# Patient Record
Sex: Female | Born: 1950
Health system: Southern US, Community
[De-identification: ages and names within clinical notes are randomized; demographics above are authoritative.]

## PROBLEM LIST (undated history)

## (undated) DIAGNOSIS — G4734 Idiopathic sleep related nonobstructive alveolar hypoventilation: Secondary | ICD-10-CM

## (undated) DIAGNOSIS — E669 Obesity, unspecified: Secondary | ICD-10-CM

## (undated) DIAGNOSIS — G4730 Sleep apnea, unspecified: Secondary | ICD-10-CM

## (undated) DIAGNOSIS — R0602 Shortness of breath: Secondary | ICD-10-CM

## (undated) DIAGNOSIS — J449 Chronic obstructive pulmonary disease, unspecified: Secondary | ICD-10-CM

## (undated) DIAGNOSIS — M1990 Unspecified osteoarthritis, unspecified site: Secondary | ICD-10-CM

## (undated) DIAGNOSIS — F99 Mental disorder, not otherwise specified: Secondary | ICD-10-CM

## (undated) DIAGNOSIS — E785 Hyperlipidemia, unspecified: Secondary | ICD-10-CM

## (undated) DIAGNOSIS — E119 Type 2 diabetes mellitus without complications: Secondary | ICD-10-CM

## (undated) DIAGNOSIS — F209 Schizophrenia, unspecified: Secondary | ICD-10-CM

## (undated) DIAGNOSIS — K219 Gastro-esophageal reflux disease without esophagitis: Secondary | ICD-10-CM

## (undated) DIAGNOSIS — E039 Hypothyroidism, unspecified: Secondary | ICD-10-CM

## (undated) DIAGNOSIS — R011 Cardiac murmur, unspecified: Secondary | ICD-10-CM

## (undated) DIAGNOSIS — F319 Bipolar disorder, unspecified: Secondary | ICD-10-CM

## (undated) DIAGNOSIS — K222 Esophageal obstruction: Secondary | ICD-10-CM

## (undated) DIAGNOSIS — I10 Essential (primary) hypertension: Secondary | ICD-10-CM

## (undated) DIAGNOSIS — F419 Anxiety disorder, unspecified: Secondary | ICD-10-CM

## (undated) HISTORY — DX: Unspecified osteoarthritis, unspecified site: M19.90

## (undated) HISTORY — DX: Hyperlipidemia, unspecified: E78.5

## (undated) HISTORY — DX: Type 2 diabetes mellitus without complications: E11.9

## (undated) HISTORY — PX: EYE SURGERY: SHX253

## (undated) HISTORY — DX: Hypothyroidism, unspecified: E03.9

## (undated) HISTORY — PX: CHOLECYSTECTOMY: SHX55

## (undated) HISTORY — DX: Obesity, unspecified: E66.9

## (undated) HISTORY — PX: TONSILLECTOMY: SUR1361

## (undated) HISTORY — DX: Sleep apnea, unspecified: G47.30

---

## 2006-12-27 ENCOUNTER — Emergency Department (HOSPITAL_COMMUNITY): Admission: EM | Admit: 2006-12-27 | Discharge: 2006-12-27 | Payer: Self-pay | Admitting: Emergency Medicine

## 2007-09-10 ENCOUNTER — Ambulatory Visit: Payer: Self-pay | Admitting: Cardiology

## 2007-09-27 ENCOUNTER — Ambulatory Visit: Payer: Self-pay | Admitting: Cardiology

## 2007-10-07 ENCOUNTER — Ambulatory Visit: Payer: Self-pay | Admitting: Cardiology

## 2007-11-17 ENCOUNTER — Ambulatory Visit: Payer: Self-pay | Admitting: Cardiology

## 2007-11-26 ENCOUNTER — Ambulatory Visit: Payer: Self-pay | Admitting: Cardiology

## 2007-12-02 ENCOUNTER — Ambulatory Visit (HOSPITAL_COMMUNITY): Admission: RE | Admit: 2007-12-02 | Discharge: 2007-12-02 | Payer: Self-pay | Admitting: Nephrology

## 2007-12-03 ENCOUNTER — Ambulatory Visit: Payer: Self-pay | Admitting: Cardiology

## 2007-12-10 ENCOUNTER — Ambulatory Visit: Payer: Self-pay | Admitting: Cardiology

## 2008-01-21 ENCOUNTER — Ambulatory Visit: Payer: Self-pay | Admitting: Cardiology

## 2008-02-05 ENCOUNTER — Emergency Department (HOSPITAL_COMMUNITY): Admission: EM | Admit: 2008-02-05 | Discharge: 2008-02-05 | Payer: Self-pay | Admitting: Emergency Medicine

## 2008-02-26 ENCOUNTER — Emergency Department (HOSPITAL_COMMUNITY): Admission: EM | Admit: 2008-02-26 | Discharge: 2008-02-27 | Payer: Self-pay | Admitting: Emergency Medicine

## 2008-07-14 ENCOUNTER — Encounter: Payer: Self-pay | Admitting: Cardiology

## 2008-09-23 ENCOUNTER — Emergency Department (HOSPITAL_COMMUNITY): Admission: EM | Admit: 2008-09-23 | Discharge: 2008-09-23 | Payer: Self-pay | Admitting: Emergency Medicine

## 2008-10-25 ENCOUNTER — Emergency Department (HOSPITAL_COMMUNITY): Admission: EM | Admit: 2008-10-25 | Discharge: 2008-10-25 | Payer: Self-pay | Admitting: Emergency Medicine

## 2008-11-21 ENCOUNTER — Emergency Department (HOSPITAL_COMMUNITY): Admission: EM | Admit: 2008-11-21 | Discharge: 2008-11-21 | Payer: Self-pay | Admitting: Emergency Medicine

## 2008-12-27 DIAGNOSIS — R0602 Shortness of breath: Secondary | ICD-10-CM | POA: Insufficient documentation

## 2008-12-27 DIAGNOSIS — I10 Essential (primary) hypertension: Secondary | ICD-10-CM

## 2008-12-27 DIAGNOSIS — R42 Dizziness and giddiness: Secondary | ICD-10-CM

## 2009-02-03 ENCOUNTER — Emergency Department (HOSPITAL_COMMUNITY): Admission: EM | Admit: 2009-02-03 | Discharge: 2009-02-03 | Payer: Self-pay | Admitting: Emergency Medicine

## 2009-06-02 ENCOUNTER — Emergency Department (HOSPITAL_COMMUNITY): Admission: EM | Admit: 2009-06-02 | Discharge: 2009-06-03 | Payer: Self-pay | Admitting: Emergency Medicine

## 2009-09-21 DIAGNOSIS — G4733 Obstructive sleep apnea (adult) (pediatric): Secondary | ICD-10-CM | POA: Insufficient documentation

## 2009-12-14 ENCOUNTER — Emergency Department (HOSPITAL_BASED_OUTPATIENT_CLINIC_OR_DEPARTMENT_OTHER): Admission: EM | Admit: 2009-12-14 | Discharge: 2009-12-14 | Payer: Self-pay | Admitting: Emergency Medicine

## 2009-12-14 ENCOUNTER — Ambulatory Visit: Payer: Self-pay | Admitting: Diagnostic Radiology

## 2010-02-19 DIAGNOSIS — F309 Manic episode, unspecified: Secondary | ICD-10-CM | POA: Insufficient documentation

## 2010-03-04 ENCOUNTER — Ambulatory Visit (HOSPITAL_COMMUNITY): Payer: Self-pay | Admitting: Psychiatry

## 2010-03-28 DIAGNOSIS — J449 Chronic obstructive pulmonary disease, unspecified: Secondary | ICD-10-CM | POA: Insufficient documentation

## 2010-05-03 ENCOUNTER — Encounter (HOSPITAL_COMMUNITY): Payer: Medicare Other | Admitting: Psychiatry

## 2010-05-03 ENCOUNTER — Ambulatory Visit (HOSPITAL_COMMUNITY): Admit: 2010-05-03 | Payer: Self-pay | Admitting: Psychiatry

## 2010-05-03 DIAGNOSIS — F259 Schizoaffective disorder, unspecified: Secondary | ICD-10-CM

## 2010-06-07 DIAGNOSIS — E039 Hypothyroidism, unspecified: Secondary | ICD-10-CM | POA: Insufficient documentation

## 2010-06-23 LAB — BASIC METABOLIC PANEL
CO2: 35 mEq/L — ABNORMAL HIGH (ref 19–32)
Chloride: 96 mEq/L (ref 96–112)
GFR calc Af Amer: >60 mL/min (ref >60)
Potassium: 4.4 mEq/L (ref 3.5–5.1)
Sodium: 135 mEq/L (ref 135–145)

## 2010-06-23 LAB — DIFFERENTIAL
Basophils Absolute: 0 10*3/uL (ref 0.0–0.1)
Basophils Relative: 0 % (ref 0–1)
Lymphocytes Relative: 18 % (ref 12–46)
Monocytes Absolute: 0.9 10*3/uL (ref 0.1–1.0)
Neutrophils Relative %: 67 % (ref 43–77)

## 2010-06-23 LAB — CBC
MCHC: 34 g/dL (ref 30.0–36.0)
MCV: 89 fL (ref 78.0–100.0)
Platelets: 180 10*3/uL (ref 150–400)
WBC: 7.9 10*3/uL (ref 4.0–10.5)

## 2010-06-23 LAB — D-DIMER, QUANTITATIVE: D-Dimer, Quant: 0.92 ug/mL-FEU — ABNORMAL HIGH (ref 0.00–0.48)

## 2010-07-07 LAB — URINALYSIS, ROUTINE W REFLEX MICROSCOPIC
Hgb urine dipstick: NEGATIVE
Ketones, ur: NEGATIVE mg/dL
Nitrite: NEGATIVE

## 2010-07-07 LAB — WET PREP, GENITAL
Trich, Wet Prep: NONE SEEN
Yeast Wet Prep HPF POC: NONE SEEN

## 2010-07-07 LAB — GC/CHLAMYDIA PROBE AMP, GENITAL: Chlamydia, DNA Probe: NEGATIVE

## 2010-07-08 LAB — URINE CULTURE: Colony Count: >=100000

## 2010-07-08 LAB — URINALYSIS, ROUTINE W REFLEX MICROSCOPIC
Glucose, UA: NEGATIVE mg/dL
Nitrite: POSITIVE — AB
Protein, ur: 30 mg/dL — AB
Urobilinogen, UA: 0.2 mg/dL (ref 0.0–1.0)

## 2010-07-08 LAB — GLUCOSE, CAPILLARY: Glucose-Capillary: 217 mg/dL — ABNORMAL HIGH (ref 70–99)

## 2010-07-08 LAB — URINE MICROSCOPIC-ADD ON

## 2010-07-12 ENCOUNTER — Encounter (HOSPITAL_COMMUNITY): Payer: Medicare Other | Admitting: Psychiatry

## 2010-07-26 ENCOUNTER — Encounter (HOSPITAL_COMMUNITY): Payer: Medicare Other | Admitting: Psychiatry

## 2010-07-26 DIAGNOSIS — F259 Schizoaffective disorder, unspecified: Secondary | ICD-10-CM

## 2010-08-13 NOTE — Assessment & Plan Note (Signed)
[facility]                          [facility]   NAME:Chelsea [name], Chelsea [name]                       MRN:          [id number]  DATE:12/10/2007                            DOB:          1950-08-11    Chelsea [name] is back for early followup.  I have been working with Dr.  Kristian Covey in an attempt to try to find a diuretic dose that is working  for the patient.  When I saw her on December 03, 2007, I was worried  that she was gaining fluid weight.  Her BMET check that day showed that  her renal function had improved greatly.  I still decided to put her  back on Bumex at 1 mg.  She then had labs on December 07, 2007, and  December 09, 2007.  This may have occurred in their office may have  ordered labs without letting Dr. Kristian Covey know.  Regardless, her  creatinine is in the 1 and/or 1.1 range.  This is excellent for her.  Her potassium is stable.  She is doing well on this dosing.  Her  breathing is better.   I had further discussions with her reminding her at each visit to be  careful with her salt intake and to limit her fluid intake.  She clearly  needs ongoing reminders about fluid.  She drinks excess fluid and I am  sure of this.   PAST MEDICAL HISTORY:   ALLERGIES:  PENICILLIN.   MEDICATIONS:  Aspirin, hydrochlorothiazide, potassium, Bumex 1 mg,  Xanax, paroxetine, glimepiride, Geodon, Lantus, Combivent, Vytorin,  trazodone, and Actos.   OTHER MEDICAL PROBLEMS:  See the list on my note of December 03, 2007.   REVIEW OF SYSTEMS:  She is really doing relatively well, and her review  of systems, otherwise, is negative.   PHYSICAL EXAMINATION:  Blood pressure is 120/60.  Heart rate is 64.  The  patient is oriented to person, time, and place.  Affect is normal.  She  is here with her family member who is a Engineer, civil (consulting).  HEENT reveals no  xanthelasma.  She has normal extraocular motion.  There are no carotid  bruits.  There is no jugular venous  distention.  Lungs are clear.  Respiratory effort is not labored.  Cardiac exam reveals S1 with an S2.  There are no clicks or significant murmurs.  The abdomen is obese.  She  still has 1+ peripheral edema.  We will not do better than this.   Problems are listed on the prior notes.  Volume overload is an issue and  she needs to be careful with her fluid intake.  Her current diuretic  dosing is stable.  I will see her back in 4 weeks.  No change in her  meds at this time.     Luis Abed, MD, [facility]  Electronically Signed    JDK/MedQ  DD: 12/10/2007  DT: 12/10/2007  Job #: [id number]   cc:   Jorja Loa, M.D.  Doreen Beam, MD

## 2010-08-13 NOTE — Assessment & Plan Note (Signed)
[facility]                          [facility]   NAME:Chelsea [name], Chelsea [name]                       MRN:          [id number]  DATE:12/03/2007                            DOB:          May 04, 1950    CARDIOLOGIST:  Luis Abed, MD, [facility]   PRIMARY CARE PHYSICIAN:  Doreen Beam, MD   REASON FOR VISIT:  1 week followup.   HISTORY OF PRESENT ILLNESS:  Chelsea [name] is a 60 year old female patient  who is morbidly obese, who has been followed very closely by Dr. Myrtis Ser  with a history of peripheral edema and exertional dyspnea.  She had a  nuclear scan done in June 2009 that revealed a small apical anterior  defect consistent with ischemia.  She was initially set up for cardiac  catheterization, but this was deferred secondary to acute-on-chronic  renal insufficiency.  Her EF was 70% by nuclear scan and was noted to be  normal by an echocardiogram earlier this year.  She was taken off of her  Bumex and Dr. Myrtis Ser has followed her closely over the last several weeks.  She had gained a few pounds when last seen, but was kept off of her  Bumex as her creatinine had improved.  She is currently being worked up  by Dr. Kristian Covey.  In the office today, she notes that she is doing well  overall.  She does complain of some dizziness today.  She states that  this has just been today.  She feels like she is going to fall and lose  her balance.  There is a questionable component of near syncope.  She  denies any syncope.  She denies chest pain.  She sleeps on 2 pillows.  She denies paroxysmal nocturnal dyspnea.  She feels like her leg edema  is worsening.   CURRENT MEDICATIONS:  Aspirin 81 mg daily, HCTZ 12.5 mg daily, Potassium  10 mEq daily, Tylenol Arthritis, Xanax 1 mg in the morning, 2 mg in the  evening, Paroxetine 40 mg daily  Geodon 20 mg in the morning, 40 mg in the evening, Glimepiride 2 mg  daily, Lantus 28 units at bedtime, Combivent 2 puffs daily,  Vytorin  10/20 mg daily  Trazodone 100 mg 2 tablets at bedtime, Bumex (on hold), Actos 30 mg  daily, Xanax 1 mg daily.   PHYSICAL EXAMINATION:  GENERAL:  She is an obese female in no acute  distress.  VITAL SIGNS:  Blood pressure is 129/84, pulse 81, weight 332 pounds  which is up 2 pounds since her last visit.  Orthostatic vital signs:  Blood pressures sitting is 129/80 and standing  141/96; pulse sitting is 72 and standing 99.  HEENT:  Normal.  NECK:  I can appreciate JVD at 90 degrees.  CARDIAC:  Distant heart sounds.  Normal S1 and S2.  Regular rate and  rhythm.  I cannot appreciate any gallops.  LUNGS:  Clear to auscultation bilaterally without wheezing, rhonchi, or  rales.  ABDOMEN:  Soft and nontender with normoactive bowel sounds.  EXTREMITIES:  2+ edema bilaterally.  NEUROLOGIC:  She  is alert and oriented x3.  Cranial nerves II through  XII are grossly intact.   Electrocardiogram reveals sinus rhythm, heart rate is 76, left axis  deviation.  PR interval with a 156 milliseconds, poor R-wave  progression.  No significant changes with previous tracing dated November 17, 2007.   ASSESSMENT AND PLAN:  1. Dyspnea with exertion and peripheral edema (volume overload) in the      setting of preserved left ventricular function.  Question of      chronic diastolic congestive heart failure.  The patient has gained      2 pounds since her last visit.  Her breathing seems to be a little      bit more short than previously.  I discussed this with Dr. Myrtis Ser who      also saw the patient.  At this point, we have to place her back on      Bumex 1 mg a day.  We will follow her renal function and potassium      very closely over the next week, and she will be brought back in      close follow Dr. Myrtis Ser.  Of note, she is on Actos.  This could      certainly be contributing to some of her edema.  She should follow      up with Dr. Sherril Croon to further discuss this.  2. Renal insufficiency.  This is  followed closely by Dr. Kristian Covey.  As      noted above, we will follow her renal function with the adjustment      of her diuretics.  3. Dizziness.  The patient's EKG reveals normal sinus rhythm today.      Her orthostatic vital signs did not show a blood pressure drop.      She does have a heart rate increase.  We will need to keep a close      eye on this with the adjustment of medications.  4. Hypertension.  This is overall well controlled.  No other      medication changes were made today.  5. Disposition.  The patient will be brought back in followup with Dr.      Myrtis Ser in the next 1-2 weeks for followup.      Tereso Newcomer, PA-C  Electronically Signed      Luis Abed, MD, [facility]  Electronically Signed   SW/MedQ  DD: 12/03/2007  DT: 12/04/2007  Job #: [id number]   cc:   Doreen Beam, MD  Jorja Loa, M.D.

## 2010-08-13 NOTE — Assessment & Plan Note (Signed)
[facility]                          [facility]   NAME:[name], [name]                       MRN:          [id number]  DATE:10/07/2007                            DOB:          1950/12/25    PRIMARY CARDIOLOGIST:  Dr. Willa Rough.   REASON FOR VISIT:  Scheduled followup.   Please refer to his recent initial consultation note of September 10, 2007,  for complete details.   [name] returns to the clinic in followup of her recent dobutamine  stress Cardiolite test, for evaluation of persistent exertional dyspnea.  She presented with several cardiac risk factors, but no documented  history of CAD.  A recent a 2-D echo, performed in Dr. Sherril Croon' office,  suggested normal LVF with mild LVH.   Additionally, Dr. Myrtis Ser up titrated bumetanide to 2 mg daily for more  aggressive management of significant peripheral edema.   Clinically, [name] suggests that although she has had significant  diuresis with the increased dose of diuretic, she has not noted any  appreciable decrease in her exertional dyspnea.   The stress test was completed on September 27, 2007, and performed as a  dobutamine study.  The patient did not develop chest pain and there were  no significant EKG changes.  However, the perfusion images, reviewed by  Dr. Andee Lineman, were interpreted as suggesting evidence of a small,  partially reversible anterior apical defect, consistent with ischemia.  Ejection fraction was normal (EF 70%), with normal wall motion.   All of these results were reviewed in detail with the patient.   CURRENT MEDICATIONS:  1. Aspirin 81 daily.  2. Actos 30 daily.  3. Bumetanide 2 daily.  4. Hydrochlorothiazide 12.5 daily.  5. Vytorin 10/20 daily.  6. Trazodone 200 q.h.s.  7. Combivent 2 puffs daily.  8. Lantus 20 units daily.  9. Diclofenac 75 daily.  10.Geodon 40 q.h.s.  11.Potassium 10 daily.  12.Glimepiride 2 daily.  13.Paroxetine 40 daily.  14.Xanax 1 mg q.a.m./2 mg q.p.m.   PHYSICAL EXAMINATION:  VITAL SIGNS:  Blood pressure 122/72, pulse 80,  regular, weight 329.8 (down 11 pound).  GENERAL:  A 60 year old female, morbidly obese, sitting upright, no  distress.  HEENT:  Normocephalic and atraumatic.  NECK:  Palpable carotid pulse without bruits; unable to assess JVD,  secondary to marked neck girth.  LUNGS:  Diminished breath sounds in bases, but without crackles or  wheezes.  HEART:  RRR (S1S2).  Diminished heart sounds, but with no significant  murmurs.  No rubs.  ABDOMEN:  Protuberant, soft, and nontender with intact bowel sounds.  EXTREMITIES:  Palpable left posterior tibialis pulses, minimally  palpable right posterior tibialis pulse.  There is trace - 1+ edema.  NEUROLOGIC:  Extremely flat affect, but no focal deficit.   IMPRESSION:  1. Exertional dyspnea.      a.     Question anginal equivalent.      b.     Abnormal dobutamine stress Cardiolite imaging study,       suggestive of small anteroapical ischemia; EF 70%.  2. Multiple cardiac risk factors.  a.     Insulin-dependent diabetes mellitus.      b.     Hypertension.      c.     History of tobacco.      d.     Age.  3. Morbid obesity.  4. Peripheral edema, improved.   PLAN:  1. Following review with Dr. Willa Rough, recommendation is to      proceed with further evaluation to rule out significant underlying      coronary artery disease.  The patient was agreeable with this plan      and the risk/benefits of the procedure were discussed.  We will      arrange to have this scheduled in our JV catheterization lab, early      next week.  2. Complete baseline labs will be drawn, including a TSH level.  3. Add p.r.n. nitroglycerin to current medication regimen.   ADDENDUM:  Pre-cardiac catheterization labs are now reviewed.  Creatinine has risen from a previous level of 1.3 to a current level of  1.90, with a corresponding BUN of 37 (GFR 27).   Potassium is 3.2.   Following review of these data with Dr. Willa Rough, plan for a  diagnostic coronary angiogram are to be placed on hold, pending  improvement in renal function.  Hydrochlorothiazide will be  discontinued and the patient is to remain on Bumex at current dose of 2  mg daily.  Repeat BMET in 1 week, followed by a return clinic visit in 2  weeks.      Chelsea Searing, PA-C  Electronically Signed      Luis Abed, MD, [facility]  Electronically Signed   GS/MedQ  DD: 10/07/2007  DT: 10/08/2007  Job #: [id number]   cc:   Luis Abed, MD, [facility]  Doreen Beam, MD

## 2010-08-13 NOTE — Assessment & Plan Note (Signed)
[facility]                          [facility]   NAME:[name], [name]                       MRN:          [id number]  DATE:11/26/2007                            DOB:          [birth year]    HISTORY OF PRESENT ILLNESS:  Chelsea [name] was seen for followup.  I had  seen her on November 16, 2007.  I was quite concerned about her renal  status.  We arranged for a visit with Dr. Kristian Covey, and she has already  seen him.  Also I have put her Bumex completely on hold.  She had been  on dose as high as 2 mg a day along with hydrochlorothiazide.  Since  that time, she has not developed any marked edema.  She has gained 5  pounds.  She is not having any chest pain.  If anything her shortness of  breath is somewhat better.   PAST MEDICAL HISTORY:   ALLERGIES:  PENICILLIN.   MEDICATIONS:  Aspirin, hydrochlorothiazide, potassium, Xanax, Geodon,  paroxetine, glimepiride, diclofenac, Lantus, Combivent, and trazodone;  her Bumex is on hold, and Actos.   OTHER MEDICAL PROBLEMS:  See the complete list on my note of November 17, 2007.   REVIEW OF SYSTEMS:  Review of systems other than the HPI is negative.   PHYSICAL EXAMINATION:  VITAL SIGNS:  Her weight is up to 330 pounds.  Blood pressure is 130/70.  GENERAL:  The patient is here with her family member who is a Engineer, civil (consulting).  The patient is oriented to person, time, and place.  Affect is normal.  HEENT:  Reveals no xanthelasma.  She has normal extraocular motion.  LUNGS:  Her lungs are clear.  Respiratory effort is not labored.  CARDIAC:  Reveals S1 and S2.  There are no clicks or significant  murmurs.  ABDOMEN:  Obese.  EXTREMITIES:  She has no significant peripheral edema.   PROBLEMS:  Problems include:  1. Morbid obesity.  2. Diabetes.  3. Shortness of breath.  This is stable as of today.  4. Question of mild abnormality on her nuclear scan.  5. Normal LV function.  6. Hypertension.  7. History of  tobacco.  8. Peripheral edema that has not returned.  9. Hallucinations at night.  10.Renal dysfunction.  She is in the midst of workup by Dr. Kristian Covey.      I am concerned about 5 pounds of weight gain.  However, there is no      edema and she is feeling well.  I am      hesitant to restart her Bumex at this time.  I will leave her off      Bumex.  She will continue with the workup with Dr. Kristian Covey and      then I will see her for Cardiology followup.     Luis Abed, MD, [facility]  Electronically Signed    JDK/MedQ  DD: 11/26/2007  DT: 11/26/2007  Job #: [id number]   cc:   Jorja Loa, M.D.  Doreen Beam, MD

## 2010-08-13 NOTE — Assessment & Plan Note (Signed)
[facility]                          [facility]   NAME:POWELLAlvah, Gilder                       MRN:          [id number]  DATE:11/17/2007                            DOB:          09/02/1950    Chelsea [name] is here for cardiology followup.  I saw her in consultation  on September 10, 2007, and then she was seen again in our office on October 07, 2007.  She has chest discomfort.  We need to rule out ischemia, but I  have not been worried about unstable symptoms.  She did have a  dobutamine stress Cardiolite.  This was done to help assess her chest  pain and persistent exertional dyspnea.  She also has several cardiac  risk factors, but no proven coronary disease.  She also has had a 2D  echo that showed good LV function with LVH.  The Cardiolite scan raised  the question of mild ischemia.  Some of her shortness of breath may have  been related to volume overload.  Her diuretics were increased.  She did  diurese.  Unfortunately, she has had increasing creatinine.  It is my  understanding that we had tried to back off on her diuretics.  I  realized now, as I am dictating this note that she may, in fact, still  be on Bumex 2 mg daily along with 12.5 mg hydrochlorothiazide.  Clearly,  this needs to be decreased before there is any further assessment.   PAST MEDICAL HISTORY:   ALLERGIES:  PENICILLIN.   MEDICATIONS:  1. Aspirin, diuretics, potassium, Xanax, paroxetine, Geodon,      glimepiride, diclofenac, Lantus, combivent, trazodone, and Actos.   OTHER MEDICAL PROBLEMS:  See the list below.   REVIEW OF SYSTEMS:  In addition to other problems, the patient says that  she has been having some hallucinations at night.  Otherwise, review of  systems is negative.   PHYSICAL EXAMINATION:  VITAL SIGNS:  Blood pressure is 128/70.  Pulse is  68.  Weight is 325 pounds, this is decreased from 329 pounds on October 05, 2007, and 340 pounds on September 07, 2007.  HEENT:  Reveals no xanthelasma.  She has normal extraocular motion.  There are no carotid bruits.  There is no jugular venous distention.  The patient is significantly overweight.  LUNGS:  Clear.  Respiratory effort is not labored.  CARDIAC:  Reveals S1 and S2.  There are no clicks or significant  murmurs.  ABDOMEN:  Obese.  She has no peripheral edema today.   Historically, the patient's creatinine had been in the range of 1.3 in  May 2009.  It was 1.9 on October 07, 2007, and a repeat on October 28, 2007,  was 2.2.  I am very concerned about this rise.  We had adjusted her  diuretics, but she came to me on Bumex 1 mg and hydrochlorothiazide  12.5.  She has already left the office.  We will be in touch with her  immediately to cut out her Bumex completely and then arrange for  followup.  Also in  the meantime, we are arranging for her to see Dr.  Kristian Covey to help with a full assessment of her renal status.   PROBLEMS:  1. Morbid obesity.  2. Diabetes.  3. Shortness of breath, this will continued to be evaluated.  4. Question of mild abnormality on her nuclear scan.  5. Normal left ventricular function.  6. Diabetes.  7. Hypertension.  8. History of tobacco.  9. Peripheral edema, improved.  10.Hallucinations at night.     Luis Abed, MD, [facility]  Electronically Signed    JDK/MedQ  DD: 11/17/2007  DT: 11/18/2007  Job #: [id number]   cc:   Doreen Beam, MD  Jorja Loa, M.D.

## 2010-08-13 NOTE — Assessment & Plan Note (Signed)
[facility]                          [facility]   NAME:Chelsea [name]                       MRN:          [id number]  DATE:01/21/2008                            DOB:          [birth year]    [name] is actually doing well.  It appears that her volume status  has stabilized.  I believe she is a little bit more active.  She is  watching her salt and fluid intake.  She is taking her medicines  appropriately.  She has shortness of breath with exertion and peripheral  edema historically.  She says that her breathing is doing somewhat  better.   ALLERGIES:  PENICILLIN.   MEDICATIONS:  1. Aspirin.  2. Potassium.  3. Bumetanide 1 mg daily.  4. Diclofenac.   OTHER MEDICAL PROBLEMS:  See the list on my note of December 03, 2007.   REVIEW OF SYSTEMS:  She is not having any fevers or chills.  She has no  GI or GU symptoms.  There are no rashes.  Her review of systems  otherwise is negative.   PHYSICAL EXAMINATION:  VITAL SIGNS:  Weight today is 325 pounds.  This  is stable by our scales.  GENERAL:  The patient is oriented to person, time, and place.  She is  here with her family member who is a Engineer, civil (consulting).  HEENT:  No xanthelasma.  She has normal extraocular motion.  NECK:  There are no carotid bruits.  There is no jugular venous  distention.  LUNGS:  Clear.  Respiratory effort is not labored.  CARDIAC:  S1 with an S2.  There are no clicks or significant murmurs.  ABDOMEN:  Obese.  EXTREMITIES:  Her legs are also large, but she has no marked edema.   Problems are listed on my note of December 03, 2007.  Dyspnea.  This  seems to be better with her current volume status and current overall  medical status.  She is stable.  No change in her meds.  I will see her  back in 3 months.     Luis Abed, MD, [facility]  Electronically Signed   JDK/MedQ  DD: 01/21/2008  DT: 01/22/2008  Job #: [id number]   cc:   Doreen Beam, MD  Jorja Loa,  M.D.

## 2010-08-13 NOTE — Assessment & Plan Note (Signed)
[facility]                          [facility]   NAME:POWELLMataya, Kilduff                       MRN:          [id number]  DATE:09/10/2007                            DOB:          November 24, 1950    Ms. Artiaga is here for the evaluation of shortness of breath.  The  patient is severely overweight.  She is 5 feet 4 inches tall and 340  pounds.  She has exertional shortness of breath.  She has had this for a  long period of time, but she is quite limited.  She received an inhaler  recently and says that she is doing somewhat better.  She does have  diabetes.  She is on Actos.  She has not had syncope or presyncope.   ALLERGIES:  PENICILLIN.   MEDICATIONS:  1. Xanax.  2. Paroxetine.  3. __________ 20.  4. Bumetanide 1 mg.  5. Glimepiride 2.  6. Potassium 10.  7. Actos.  8. Diclofenac.  9. Lantus.  10.Combivent.   OTHER MEDICAL PROBLEMS:  See the list below.   SOCIAL HISTORY:  The patient lives here in town.  She does not smoke.   FAMILY HISTORY:  There is no strong family history of coronary disease.   REVIEW OF SYSTEMS:  As of today, her major complaint is her shortness of  breath.  Otherwise, review of systems is negative.   PHYSICAL EXAMINATION:  GENERAL:  The patient is oriented to person,  time, and place.  She is markedly overweight.  Affect is normal.  VITAL SIGNS:  Blood pressure is 121/69.  Her pulse is 77.  HEENT:  No xanthelasma.  She has normal extraocular motion.  NECK:  There are no carotid bruits.  There is no jugular venous  distention.  LUNGS:  Clear.  Respiratory effort is not labored.  CARDIAC:  S1 with an S2.  There are no clicks or significant murmurs.  ABDOMEN:  Obese.  The patient has peripheral edema.   LABORATORY DATA:  EKG reveals decreased anterior R wave.  The patient  had an echo that was done as an outpatient dated 08/16/2007.  It was  technically difficult.  There was LVH, but there were no  significant  valvular abnormalities and the ejection fraction was felt to be 60%.  Also, the patient has a BUN of 25 and a creatinine of 1.3 by recent labs  dated 08/24/2007.   PROBLEM LIST:  1. Very morbid obesity.  2. History of tonsillectomy and cholecystectomy.  3. Diabetes.  4. Current shortness of breath.  The patient certainly has multiple      reasons for being short of breath.  We will try to help decide if      we think this is cardiac.  She has good left ventricular function      in systole.  She does have peripheral edema at this time.  She is      on bumetanide 1 mg.  We will increase this to 2 mg.  We will see if      she diureses better.  We  will talk to her about salt and fluid      intake.  She will have a dobutamine Cardiolite scan and then we      will see her for followup.     Luis Abed, MD, [facility]  Electronically Signed    JDK/MedQ  DD: 09/10/2007  DT: 09/11/2007  Job #: [id number]   cc:   Doreen Beam, MD

## 2010-08-13 NOTE — Assessment & Plan Note (Signed)
[facility]                          [facility] [facility]   NAME:POWELLKymari, Lollis                       MRN:          [id number]  DATE:10/07/2007                            DOB:          May 06, 1950    ADDENDUM  Pre-cardiac catheterization labs are now reviewed.  Creatinine has risen  from a previous level of 1.3 to a current level of 1.90, with a  corresponding BUN of 37 (GFR 27).  Potassium is 3.2.   Reviewed these data with Dr. Willa Rough.  Diagnostic coronary  angiogram is to be placed on hold, pending improvement in renal  function.  Hydrochlorothiazide will be discontinued and the patient is  to remain on Bumex at current dose of 2 mg daily.  Repeat to be made in  1 week.  Follow up by return clinic visit in 2 weeks.     Gene Serpe, PA-C     GS/MedQ  DD: 10/08/2007  DT: 10/09/2007  Job #: [id number]

## 2010-09-13 ENCOUNTER — Encounter (INDEPENDENT_AMBULATORY_CARE_PROVIDER_SITE_OTHER): Payer: Medicare Other | Admitting: Psychiatry

## 2010-09-13 DIAGNOSIS — F259 Schizoaffective disorder, unspecified: Secondary | ICD-10-CM

## 2010-09-27 ENCOUNTER — Encounter (HOSPITAL_COMMUNITY): Payer: Medicare Other | Admitting: Psychiatry

## 2010-11-05 DIAGNOSIS — I517 Cardiomegaly: Secondary | ICD-10-CM | POA: Insufficient documentation

## 2010-11-22 ENCOUNTER — Encounter (HOSPITAL_COMMUNITY): Payer: Medicare Other | Admitting: Psychiatry

## 2010-11-29 ENCOUNTER — Encounter (HOSPITAL_COMMUNITY): Payer: Medicare Other | Admitting: Psychiatry

## 2010-12-20 ENCOUNTER — Encounter (HOSPITAL_COMMUNITY): Payer: Medicare Other | Admitting: Psychiatry

## 2010-12-27 ENCOUNTER — Encounter (INDEPENDENT_AMBULATORY_CARE_PROVIDER_SITE_OTHER): Payer: Medicare Other | Admitting: Psychiatry

## 2010-12-27 DIAGNOSIS — F259 Schizoaffective disorder, unspecified: Secondary | ICD-10-CM

## 2010-12-31 LAB — URINE MICROSCOPIC-ADD ON

## 2010-12-31 LAB — URINALYSIS, ROUTINE W REFLEX MICROSCOPIC
Hgb urine dipstick: NEGATIVE
Nitrite: NEGATIVE
Protein, ur: NEGATIVE
Specific Gravity, Urine: 1.025
Urobilinogen, UA: 0.2

## 2010-12-31 LAB — DIFFERENTIAL
Basophils Absolute: 0
Basophils Relative: 1
Eosinophils Absolute: 0.4
Eosinophils Relative: 6 — ABNORMAL HIGH
Monocytes Absolute: 0.4
Monocytes Relative: 6
Neutro Abs: 4.3

## 2010-12-31 LAB — BASIC METABOLIC PANEL
BUN: 10
Calcium: 9
Creatinine, Ser: 1.15
GFR calc non Af Amer: 49 — ABNORMAL LOW
Glucose, Bld: 193 — ABNORMAL HIGH
Potassium: 3.5

## 2010-12-31 LAB — URINE CULTURE

## 2010-12-31 LAB — CBC
Hemoglobin: 12.4
MCHC: 33.9
MCV: 89.8
RDW: 14.5

## 2011-01-10 DIAGNOSIS — J309 Allergic rhinitis, unspecified: Secondary | ICD-10-CM | POA: Insufficient documentation

## 2011-01-24 ENCOUNTER — Encounter (INDEPENDENT_AMBULATORY_CARE_PROVIDER_SITE_OTHER): Payer: Medicare Other | Admitting: Psychiatry

## 2011-01-24 DIAGNOSIS — F259 Schizoaffective disorder, unspecified: Secondary | ICD-10-CM

## 2011-04-11 DIAGNOSIS — J309 Allergic rhinitis, unspecified: Secondary | ICD-10-CM | POA: Diagnosis not present

## 2011-04-11 DIAGNOSIS — J069 Acute upper respiratory infection, unspecified: Secondary | ICD-10-CM | POA: Diagnosis not present

## 2011-04-11 DIAGNOSIS — J449 Chronic obstructive pulmonary disease, unspecified: Secondary | ICD-10-CM | POA: Diagnosis not present

## 2011-04-11 DIAGNOSIS — E781 Pure hyperglyceridemia: Secondary | ICD-10-CM | POA: Diagnosis not present

## 2011-04-11 DIAGNOSIS — E119 Type 2 diabetes mellitus without complications: Secondary | ICD-10-CM | POA: Diagnosis not present

## 2011-04-11 DIAGNOSIS — E785 Hyperlipidemia, unspecified: Secondary | ICD-10-CM | POA: Diagnosis not present

## 2011-04-18 ENCOUNTER — Encounter (HOSPITAL_COMMUNITY): Payer: Medicare Other | Admitting: Psychiatry

## 2011-05-02 ENCOUNTER — Ambulatory Visit (HOSPITAL_COMMUNITY): Payer: Medicare Other | Admitting: Psychiatry

## 2011-05-02 DIAGNOSIS — IMO0002 Reserved for concepts with insufficient information to code with codable children: Secondary | ICD-10-CM | POA: Diagnosis not present

## 2011-05-02 DIAGNOSIS — M79609 Pain in unspecified limb: Secondary | ICD-10-CM | POA: Diagnosis not present

## 2011-05-02 DIAGNOSIS — R209 Unspecified disturbances of skin sensation: Secondary | ICD-10-CM | POA: Diagnosis not present

## 2011-05-09 DIAGNOSIS — L03039 Cellulitis of unspecified toe: Secondary | ICD-10-CM | POA: Diagnosis not present

## 2011-05-16 DIAGNOSIS — IMO0002 Reserved for concepts with insufficient information to code with codable children: Secondary | ICD-10-CM | POA: Diagnosis not present

## 2011-05-23 DIAGNOSIS — L03039 Cellulitis of unspecified toe: Secondary | ICD-10-CM | POA: Diagnosis not present

## 2011-05-30 ENCOUNTER — Ambulatory Visit (HOSPITAL_COMMUNITY): Payer: Medicare Other | Admitting: Psychiatry

## 2011-05-30 DIAGNOSIS — L97509 Non-pressure chronic ulcer of other part of unspecified foot with unspecified severity: Secondary | ICD-10-CM | POA: Diagnosis not present

## 2011-06-06 DIAGNOSIS — L97509 Non-pressure chronic ulcer of other part of unspecified foot with unspecified severity: Secondary | ICD-10-CM | POA: Diagnosis not present

## 2011-06-13 ENCOUNTER — Ambulatory Visit (INDEPENDENT_AMBULATORY_CARE_PROVIDER_SITE_OTHER): Payer: Medicare Other | Admitting: Psychiatry

## 2011-06-13 ENCOUNTER — Encounter (HOSPITAL_COMMUNITY): Payer: Self-pay | Admitting: Psychiatry

## 2011-06-13 VITALS — BP 148/84 | HR 67 | Wt 286.0 lb

## 2011-06-13 DIAGNOSIS — F200 Paranoid schizophrenia: Secondary | ICD-10-CM

## 2011-06-13 MED ORDER — TRAZODONE HCL 100 MG PO TABS: ORAL_TABLET | ORAL | Status: DC

## 2011-06-13 MED ORDER — ZIPRASIDONE HCL 60 MG PO CAPS: 60.0000 mg | ORAL_CAPSULE | Freq: Every day | ORAL | Status: DC
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
## 2011-06-20 DIAGNOSIS — F309 Manic episode, unspecified: Secondary | ICD-10-CM | POA: Diagnosis not present

## 2011-06-20 DIAGNOSIS — IMO0001 Reserved for inherently not codable concepts without codable children: Secondary | ICD-10-CM | POA: Diagnosis not present

## 2011-06-20 DIAGNOSIS — E785 Hyperlipidemia, unspecified: Secondary | ICD-10-CM | POA: Diagnosis not present

## 2011-06-20 DIAGNOSIS — E781 Pure hyperglyceridemia: Secondary | ICD-10-CM | POA: Diagnosis not present

## 2011-06-20 DIAGNOSIS — E1169 Type 2 diabetes mellitus with other specified complication: Secondary | ICD-10-CM | POA: Insufficient documentation

## 2011-06-20 DIAGNOSIS — E039 Hypothyroidism, unspecified: Secondary | ICD-10-CM | POA: Diagnosis not present

## 2011-06-20 DIAGNOSIS — E119 Type 2 diabetes mellitus without complications: Secondary | ICD-10-CM | POA: Diagnosis not present

## 2011-06-23 ENCOUNTER — Other Ambulatory Visit (HOSPITAL_COMMUNITY): Payer: Self-pay | Admitting: Psychiatry

## 2011-06-23 DIAGNOSIS — F200 Paranoid schizophrenia: Secondary | ICD-10-CM

## 2011-06-23 MED ORDER — TRAZODONE HCL 100 MG PO TABS: ORAL_TABLET | ORAL | Status: DC

## 2011-06-23 MED ORDER — ZIPRASIDONE HCL 60 MG PO CAPS: 60.0000 mg | ORAL_CAPSULE | Freq: Every day | ORAL | Status: DC

## 2011-06-23 NOTE — Progress Notes (Signed)
Call return to the patient.  Patient is taking trazodone 100 mg which she reported given by her pharmacy.  I called the pharmacy and her primary care physician as she was given the prescription last time by her primary care physician.  As per pharmacy and primary care physician she has given trazodone 200 mg but it is unclear why she is taking only 100 mg.  I recommended to take trazodone 200 mg and Geodon 60 mg.  I will authorize one more time these prescription to the pharmacy.

## 2011-07-01 ENCOUNTER — Other Ambulatory Visit (HOSPITAL_COMMUNITY): Payer: Self-pay | Admitting: *Deleted

## 2011-07-02 ENCOUNTER — Telehealth (HOSPITAL_COMMUNITY): Payer: Self-pay | Admitting: *Deleted

## 2011-07-02 MED ORDER — ALPRAZOLAM 1 MG PO TABS: 1.0000 mg | ORAL_TABLET | Freq: Every evening | ORAL | Status: DC | PRN

## 2011-07-11 ENCOUNTER — Ambulatory Visit (INDEPENDENT_AMBULATORY_CARE_PROVIDER_SITE_OTHER): Payer: Medicare Other | Admitting: Psychiatry

## 2011-07-11 ENCOUNTER — Encounter (HOSPITAL_COMMUNITY): Payer: Self-pay | Admitting: Psychiatry

## 2011-07-11 VITALS — BP 136/95 | HR 85 | Wt 287.4 lb

## 2011-07-11 DIAGNOSIS — F200 Paranoid schizophrenia: Secondary | ICD-10-CM

## 2011-07-11 MED ORDER — ZIPRASIDONE HCL 60 MG PO CAPS: 60.0000 mg | ORAL_CAPSULE | Freq: Every day | ORAL | Status: DC

## 2011-07-11 MED ORDER — TRAZODONE HCL 100 MG PO TABS: ORAL_TABLET | ORAL | Status: DC

## 2011-07-11 MED ORDER — ALPRAZOLAM 1 MG PO TABS: 1.0000 mg | ORAL_TABLET | Freq: Every evening | ORAL | Status: DC | PRN

## 2011-07-11 MED ORDER — PAROXETINE HCL 40 MG PO TABS: 40.0000 mg | ORAL_TABLET | ORAL | Status: DC
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
## 2011-07-22 ENCOUNTER — Telehealth (HOSPITAL_COMMUNITY): Payer: Self-pay

## 2011-08-15 ENCOUNTER — Other Ambulatory Visit: Payer: Self-pay | Admitting: Family Medicine

## 2011-08-15 DIAGNOSIS — E559 Vitamin D deficiency, unspecified: Secondary | ICD-10-CM | POA: Diagnosis not present

## 2011-08-15 DIAGNOSIS — I10 Essential (primary) hypertension: Secondary | ICD-10-CM | POA: Diagnosis not present

## 2011-08-15 DIAGNOSIS — E782 Mixed hyperlipidemia: Secondary | ICD-10-CM | POA: Diagnosis not present

## 2011-08-15 DIAGNOSIS — Z124 Encounter for screening for malignant neoplasm of cervix: Secondary | ICD-10-CM | POA: Diagnosis not present

## 2011-08-15 DIAGNOSIS — Z780 Asymptomatic menopausal state: Secondary | ICD-10-CM

## 2011-08-15 DIAGNOSIS — F411 Generalized anxiety disorder: Secondary | ICD-10-CM | POA: Diagnosis not present

## 2011-08-15 DIAGNOSIS — Z79899 Other long term (current) drug therapy: Secondary | ICD-10-CM | POA: Diagnosis not present

## 2011-08-15 DIAGNOSIS — Z1231 Encounter for screening mammogram for malignant neoplasm of breast: Secondary | ICD-10-CM

## 2011-08-15 DIAGNOSIS — Z0000 Encounter for general adult medical examination without abnormal findings: Secondary | ICD-10-CM | POA: Diagnosis not present

## 2011-08-15 DIAGNOSIS — N390 Urinary tract infection, site not specified: Secondary | ICD-10-CM | POA: Diagnosis not present

## 2011-08-15 DIAGNOSIS — E1169 Type 2 diabetes mellitus with other specified complication: Secondary | ICD-10-CM | POA: Diagnosis not present

## 2011-08-15 DIAGNOSIS — E039 Hypothyroidism, unspecified: Secondary | ICD-10-CM | POA: Diagnosis not present

## 2011-08-29 DIAGNOSIS — R040 Epistaxis: Secondary | ICD-10-CM | POA: Diagnosis not present

## 2011-08-29 DIAGNOSIS — J309 Allergic rhinitis, unspecified: Secondary | ICD-10-CM | POA: Diagnosis not present

## 2011-09-05 ENCOUNTER — Encounter (HOSPITAL_COMMUNITY): Payer: Self-pay | Admitting: Psychiatry

## 2011-09-05 ENCOUNTER — Ambulatory Visit (INDEPENDENT_AMBULATORY_CARE_PROVIDER_SITE_OTHER): Payer: Medicare Other | Admitting: Psychiatry

## 2011-09-05 VITALS — BP 126/86 | Wt 272.2 lb

## 2011-09-05 DIAGNOSIS — F200 Paranoid schizophrenia: Secondary | ICD-10-CM

## 2011-09-05 MED ORDER — TRAZODONE HCL 100 MG PO TABS: ORAL_TABLET | ORAL | Status: DC

## 2011-09-05 MED ORDER — ZIPRASIDONE HCL 60 MG PO CAPS: 60.0000 mg | ORAL_CAPSULE | Freq: Every day | ORAL | Status: DC

## 2011-09-05 MED ORDER — PAROXETINE HCL 40 MG PO TABS: 40.0000 mg | ORAL_TABLET | ORAL | Status: DC
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
## 2011-09-19 ENCOUNTER — Ambulatory Visit: Payer: Medicare Other

## 2011-09-19 ENCOUNTER — Other Ambulatory Visit: Payer: Medicare Other

## 2011-10-17 ENCOUNTER — Ambulatory Visit: Payer: Medicare Other

## 2011-10-17 ENCOUNTER — Other Ambulatory Visit: Payer: Medicare Other

## 2011-10-17 DIAGNOSIS — E119 Type 2 diabetes mellitus without complications: Secondary | ICD-10-CM | POA: Diagnosis not present

## 2011-10-17 DIAGNOSIS — Z23 Encounter for immunization: Secondary | ICD-10-CM | POA: Diagnosis not present

## 2011-10-17 DIAGNOSIS — E039 Hypothyroidism, unspecified: Secondary | ICD-10-CM | POA: Diagnosis not present

## 2011-10-17 DIAGNOSIS — I10 Essential (primary) hypertension: Secondary | ICD-10-CM | POA: Diagnosis not present

## 2011-10-30 ENCOUNTER — Telehealth (HOSPITAL_COMMUNITY): Payer: Self-pay

## 2011-10-30 NOTE — Telephone Encounter (Signed)
9:08am 10/30/11 pt's sister called because she is the transportation - the pt's sister has to work and can't make the appt./sh

## 2011-10-31 ENCOUNTER — Other Ambulatory Visit: Payer: Medicare Other

## 2011-10-31 ENCOUNTER — Ambulatory Visit (HOSPITAL_COMMUNITY): Payer: Medicare Other | Admitting: Psychiatry

## 2011-10-31 ENCOUNTER — Ambulatory Visit: Payer: Medicare Other

## 2011-11-12 ENCOUNTER — Other Ambulatory Visit (HOSPITAL_COMMUNITY): Payer: Self-pay | Admitting: *Deleted

## 2011-11-12 DIAGNOSIS — F200 Paranoid schizophrenia: Secondary | ICD-10-CM

## 2011-11-12 MED ORDER — ALPRAZOLAM 1 MG PO TABS: 1.0000 mg | ORAL_TABLET | Freq: Every evening | ORAL | Status: AC | PRN

## 2011-11-12 MED ORDER — PAROXETINE HCL 40 MG PO TABS: 40.0000 mg | ORAL_TABLET | ORAL | Status: DC

## 2011-11-14 ENCOUNTER — Ambulatory Visit (HOSPITAL_COMMUNITY): Payer: Medicare Other | Admitting: Psychiatry

## 2011-11-20 ENCOUNTER — Other Ambulatory Visit (HOSPITAL_COMMUNITY): Payer: Self-pay | Admitting: *Deleted

## 2011-11-20 DIAGNOSIS — F200 Paranoid schizophrenia: Secondary | ICD-10-CM

## 2011-11-20 MED ORDER — ZIPRASIDONE HCL 60 MG PO CAPS: 60.0000 mg | ORAL_CAPSULE | Freq: Every day | ORAL | Status: DC

## 2011-11-20 MED ORDER — TRAZODONE HCL 100 MG PO TABS: ORAL_TABLET | ORAL | Status: DC

## 2011-11-28 ENCOUNTER — Encounter (HOSPITAL_COMMUNITY): Payer: Self-pay | Admitting: Psychiatry

## 2011-11-28 ENCOUNTER — Ambulatory Visit (INDEPENDENT_AMBULATORY_CARE_PROVIDER_SITE_OTHER): Payer: Medicare Other | Admitting: Psychiatry

## 2011-11-28 VITALS — BP 120/76 | HR 60 | Wt 273.0 lb

## 2011-11-28 DIAGNOSIS — F200 Paranoid schizophrenia: Secondary | ICD-10-CM
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
## 2011-12-18 ENCOUNTER — Other Ambulatory Visit (HOSPITAL_COMMUNITY): Payer: Self-pay | Admitting: *Deleted

## 2012-01-09 ENCOUNTER — Other Ambulatory Visit (HOSPITAL_COMMUNITY): Payer: Self-pay | Admitting: Psychiatry

## 2012-01-09 DIAGNOSIS — F200 Paranoid schizophrenia: Secondary | ICD-10-CM

## 2012-01-09 MED ORDER — ALPRAZOLAM 1 MG PO TABS: 1.0000 mg | ORAL_TABLET | Freq: Every day | ORAL | Status: DC

## 2012-01-09 MED ORDER — TRAZODONE HCL 100 MG PO TABS: ORAL_TABLET | ORAL | Status: DC

## 2012-01-09 MED ORDER — PAROXETINE HCL 40 MG PO TABS: 40.0000 mg | ORAL_TABLET | ORAL | Status: DC

## 2012-01-09 MED ORDER — ZIPRASIDONE HCL 60 MG PO CAPS: 60.0000 mg | ORAL_CAPSULE | Freq: Every day | ORAL | Status: DC

## 2012-01-14 ENCOUNTER — Other Ambulatory Visit (HOSPITAL_COMMUNITY): Payer: Self-pay | Admitting: Psychiatry

## 2012-01-16 DIAGNOSIS — IMO0001 Reserved for inherently not codable concepts without codable children: Secondary | ICD-10-CM | POA: Diagnosis not present

## 2012-01-16 DIAGNOSIS — Z23 Encounter for immunization: Secondary | ICD-10-CM | POA: Diagnosis not present

## 2012-02-27 ENCOUNTER — Ambulatory Visit (HOSPITAL_COMMUNITY): Payer: Self-pay | Admitting: Psychiatry

## 2012-03-05 ENCOUNTER — Ambulatory Visit
Admission: RE | Admit: 2012-03-05 | Discharge: 2012-03-05 | Disposition: A | Discharge status: Home / Self Care | Payer: Medicare Other | Source: Ambulatory Visit | Attending: Family Medicine | Admitting: Family Medicine

## 2012-03-05 ENCOUNTER — Ambulatory Visit (HOSPITAL_COMMUNITY): Payer: Self-pay | Admitting: Psychiatry

## 2012-03-05 DIAGNOSIS — Z1231 Encounter for screening mammogram for malignant neoplasm of breast: Secondary | ICD-10-CM

## 2012-03-05 DIAGNOSIS — Z780 Asymptomatic menopausal state: Secondary | ICD-10-CM

## 2012-03-10 ENCOUNTER — Ambulatory Visit (INDEPENDENT_AMBULATORY_CARE_PROVIDER_SITE_OTHER): Payer: Self-pay | Admitting: Psychiatry

## 2012-03-10 ENCOUNTER — Other Ambulatory Visit (HOSPITAL_COMMUNITY): Payer: Self-pay | Admitting: *Deleted

## 2012-03-10 ENCOUNTER — Encounter (HOSPITAL_COMMUNITY): Payer: Self-pay | Admitting: Psychiatry

## 2012-03-10 VITALS — Wt 274.0 lb

## 2012-03-10 DIAGNOSIS — F200 Paranoid schizophrenia: Secondary | ICD-10-CM

## 2012-03-10 MED ORDER — ZIPRASIDONE HCL 60 MG PO CAPS: 60.0000 mg | ORAL_CAPSULE | Freq: Every day | ORAL | Status: DC

## 2012-03-10 MED ORDER — ALPRAZOLAM 1 MG PO TABS: 1.0000 mg | ORAL_TABLET | Freq: Every day | ORAL | Status: DC

## 2012-03-10 MED ORDER — TRAZODONE HCL 100 MG PO TABS: ORAL_TABLET | ORAL | Status: DC

## 2012-03-10 MED ORDER — PAROXETINE HCL 40 MG PO TABS: 40.0000 mg | ORAL_TABLET | ORAL | Status: DC
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
## 2012-03-12 ENCOUNTER — Ambulatory Visit
Admission: RE | Admit: 2012-03-12 | Discharge: 2012-03-12 | Disposition: A | Discharge status: Home / Self Care | Payer: Medicare Other | Source: Ambulatory Visit | Attending: Family Medicine | Admitting: Family Medicine

## 2012-03-12 DIAGNOSIS — Z780 Asymptomatic menopausal state: Secondary | ICD-10-CM | POA: Diagnosis not present

## 2012-03-25 ENCOUNTER — Other Ambulatory Visit: Payer: Self-pay | Admitting: Family Medicine

## 2012-03-25 DIAGNOSIS — R928 Other abnormal and inconclusive findings on diagnostic imaging of breast: Secondary | ICD-10-CM

## 2012-04-02 ENCOUNTER — Ambulatory Visit
Admission: RE | Admit: 2012-04-02 | Discharge: 2012-04-02 | Disposition: A | Discharge status: Home / Self Care | Payer: Medicare Other | Source: Ambulatory Visit | Attending: Family Medicine | Admitting: Family Medicine

## 2012-04-02 ENCOUNTER — Other Ambulatory Visit: Payer: Self-pay | Admitting: Family Medicine

## 2012-04-02 DIAGNOSIS — R920 Mammographic microcalcification found on diagnostic imaging of breast: Secondary | ICD-10-CM | POA: Diagnosis not present

## 2012-04-02 DIAGNOSIS — R928 Other abnormal and inconclusive findings on diagnostic imaging of breast: Secondary | ICD-10-CM

## 2012-04-02 DIAGNOSIS — R921 Mammographic calcification found on diagnostic imaging of breast: Secondary | ICD-10-CM

## 2012-04-16 ENCOUNTER — Ambulatory Visit
Admission: RE | Admit: 2012-04-16 | Discharge: 2012-04-16 | Disposition: A | Discharge status: Home / Self Care | Payer: Medicare Other | Source: Ambulatory Visit | Attending: Family Medicine | Admitting: Family Medicine

## 2012-04-16 DIAGNOSIS — R921 Mammographic calcification found on diagnostic imaging of breast: Secondary | ICD-10-CM

## 2012-04-23 ENCOUNTER — Encounter (INDEPENDENT_AMBULATORY_CARE_PROVIDER_SITE_OTHER): Payer: Self-pay | Admitting: General Surgery

## 2012-04-23 ENCOUNTER — Telehealth (INDEPENDENT_AMBULATORY_CARE_PROVIDER_SITE_OTHER): Payer: Self-pay | Admitting: General Surgery

## 2012-04-23 ENCOUNTER — Ambulatory Visit (INDEPENDENT_AMBULATORY_CARE_PROVIDER_SITE_OTHER): Payer: Medicare Other | Admitting: General Surgery

## 2012-04-23 VITALS — BP 130/62 | HR 72 | Temp 96.6°F | Resp 18 | Ht 62.0 in | Wt 278.2 lb

## 2012-04-23 DIAGNOSIS — R920 Mammographic microcalcification found on diagnostic imaging of breast: Secondary | ICD-10-CM

## 2012-04-23 DIAGNOSIS — E1169 Type 2 diabetes mellitus with other specified complication: Secondary | ICD-10-CM | POA: Diagnosis not present

## 2012-04-23 DIAGNOSIS — I10 Essential (primary) hypertension: Secondary | ICD-10-CM | POA: Diagnosis not present

## 2012-04-23 DIAGNOSIS — E782 Mixed hyperlipidemia: Secondary | ICD-10-CM | POA: Diagnosis not present

## 2012-04-23 DIAGNOSIS — F411 Generalized anxiety disorder: Secondary | ICD-10-CM | POA: Diagnosis not present

## 2012-04-23 DIAGNOSIS — E039 Hypothyroidism, unspecified: Secondary | ICD-10-CM | POA: Diagnosis not present

## 2012-04-23 NOTE — Patient Instructions (Signed)
Your mammogram shows an abnormal area of calcium deposits in the upper outer quadrant of the left breast. There is a possibility that this represents early breast cancer. Hopefully it is a benign change.  You'll be scheduled for a left breast biopsy with needle localization in the near future.     Lumpectomy, Breast Conserving Surgery A lumpectomy is breast surgery that removes only part of the breast. Another name used may be partial mastectomy. The amount removed varies. Make sure you understand how much of your breast will be removed. Reasons for a lumpectomy:  Any solid breast mass.  Grouped significant nodularity that may be confused with a solitary breast mass. Lumpectomy is the most common form of breast cancer surgery today. The surgeon removes the portion of your breast which contains the tumor (cancer). This is the lump. Some normal tissue around the lump is also removed to be sure that all the tumor has been removed.  If cancer cells are found in the margins where the breast tissue was removed, your surgeon will do more surgery to remove the remaining cancer tissue. This is called re-excision surgery. Radiation and/or chemotherapy treatments are often given following a lumpectomy to kill any cancer cells that could possibly remain.  REASONS YOU MAY NOT BE ABLE TO HAVE BREAST CONSERVING SURGERY:  The tumor is located in more than one place.  Your breast is small and the tumor is large so the breast would be disfigured.  The entire tumor removal is not successful with a lumpectomy.  You cannot commit to a full course of chemotherapy, radiation therapy or are pregnant and cannot have radiation.  You have previously had radiation to the breast to treat cancer. HOW A LUMPECTOMY IS PERFORMED If overnight nursing is not required following a biopsy, a lumpectomy can be performed as a same-day surgery. This can be done in a hospital, clinic, or surgical center. The anesthesia used will  depend on your surgeon. They will discuss this with you. A general anesthetic keeps you sleeping through the procedure. LET YOUR CAREGIVERS KNOW ABOUT THE FOLLOWING:  Allergies  Medications taken including herbs, eye drops, over the counter medications, and creams.  Use of steroids (by mouth or creams)  Previous problems with anesthetics or Novocaine.  Possibility of pregnancy, if this applies  History of blood clots (thrombophlebitis)  History of bleeding or blood problems.  Previous surgery  Other health problems BEFORE THE PROCEDURE You should be present one hour prior to your procedure unless directed otherwise.  AFTER THE PROCEDURE  After surgery, you will be taken to the recovery area where a nurse will watch and check your progress. Once you're awake, stable, and taking fluids well, barring other problems you will be allowed to go home.  Ice packs applied to your operative site may help with discomfort and keep the swelling down.  A small rubber drain may be placed in the breast for a couple of days to prevent a hematoma from developing in the breast.  A pressure dressing may be applied for 24 to 48 hours to prevent bleeding.  Keep the wound dry.  You may resume a normal diet and activities as directed. Avoid strenuous activities affecting the arm on the side of the biopsy site such as tennis, swimming, heavy lifting (more than 10 pounds) or pulling.  Bruising in the breast is normal following this procedure.  Wearing a bra - even to bed - may be more comfortable and also help keep the dressing  on.  Change dressings as directed.  Only take over-the-counter or prescription medicines for pain, discomfort, or fever as directed by your caregiver. Call for your results as instructed by your surgeon. Remember it is your responsibility to get the results of your lumpectomy if your surgeon asked you to follow-up. Do not assume everything is fine if you have not heard from  your caregiver. SEEK MEDICAL CARE IF:   There is increased bleeding (more than a small spot) from the wound.  You notice redness, swelling, or increasing pain in the wound.  Pus is coming from wound.  An unexplained oral temperature above 102 F (38.9 C) develops.  You notice a foul smell coming from the wound or dressing. SEEK IMMEDIATE MEDICAL CARE IF:   You develop a rash.  You have difficulty breathing.  You have any allergic problems. Document Released: 04/28/2006 Document Revised: 06/09/2011 Document Reviewed: 07/30/2006 [facility] Patient Information 2013 [place], [place].     Lumpectomy, Breast Conserving Surgery Care After Please read the instructions outlined below and refer to this sheet in the next few weeks. These discharge instructions provide you with general information on caring for yourself after you leave the hospital. Your surgeon may also give you specific instructions. While your treatment has been planned according to the most current medical practices available, unavoidable complications occasionally occur. If you have any problems or questions after discharge, please call your surgeon. Reasons for a lumpectomy:  Any solid breast mass.  Grouped significant nodularity that may be confused with a solitary breast mass. AFTER THE PROCEDURE  After surgery, you will be taken to the recovery area where a nurse will watch and check your progress. Once you're awake, stable, and taking fluids well, barring other problems you will be allowed to go home.  Ice packs applied to your operative site may help with discomfort and keep the swelling down.  A small rubber drain may be placed in the incision for a couple of days to prevent a hematoma in the breast.  A pressure dressing may be applied for 24 to 48 hours to prevent bleeding.  Keep the wound dry.  You may resume a normal diet and activities as directed. Avoid strenuous activities affecting the arm on the  side of the biopsy site such as tennis, swimming, heavy lifting (more than 10 pounds) or pulling.  Bruising in the breast is normal following this procedure.  Wearing a bra - even to bed - may be more comfortable and also help keep the dressing on.  Change dressings as directed.  Only take over-the-counter or prescription medicines for pain, discomfort, or fever as directed by your caregiver. Call for your results as instructed by your surgeon. Remember it isyour responsibility to get the results of your lumpectomy if your surgeon asked you to follow-up. Do not assume everything is fine if you have not heard from your caregiver. SEEK MEDICAL CARE IF:   There is increased bleeding (more than a small spot) from the wound.  You notice redness, swelling, or increasing pain in the wound.  Pus is coming from wound.  An unexplained oral temperature above 102 F (38.9 C) develops.  You notice a foul smell coming from the wound or dressing. SEEK IMMEDIATE MEDICAL CARE IF:   You develop a rash.  You have difficulty breathing.  You have any allergic problems. Document Released: 04/02/2006 Document Revised: 06/09/2011 Document Reviewed: 03/05/2007 [facility] Patient Information 2013 [place], [place].

## 2012-04-23 NOTE — Telephone Encounter (Signed)
Patient sister Mardene Celeste called in saying that Chelsea [name] has an appointment with her PCP at 1:20 and did not think they could get here for a 2:00 appointment. I advised for them to get here as quickly as they can in order for her to be seen sooner.   Appointment moved to 2:00 from 5:00.

## 2012-04-23 NOTE — Telephone Encounter (Signed)
Called and left message for patient to advised that she can be seen sooner today due to Dr. Derrell Lolling having completed his surgical cases at the hospital early. Offered for patient to be seen today at 1:30.   If the patient cannot come in at 1:30, but can be seen earlier than the 5:00 appointment, please move the appointment time for evaluation. Will need 30 minute slot as she is a new patient. Thank you.

## 2012-04-23 NOTE — Progress Notes (Signed)
Patient ID: Chelsea [name], female   DOB: Aug 16, 1950, 62 y.o.   MRN: [id number]  No chief complaint on file.   HPI Chelsea [name] is a 62 y.o. female.  She is referred by Dr. Christiana Pellant at the breast center of [facility] for evaluation and management of an abnormal mammogram, showing calcifications in the upper outer quadrant of the left breast. Her primary care physician is Dr. Mardelle Matte at [facility] Her psychiatrist is Dr. Kathryne Sharper.  This patient is under treatment for paranoid schizophrenia. Her sister is with her today. She has never had a breast problem in the past. She has not had a mammogram and cervical years. Recent screening mammograms and diagnostic mammograms showed a group of indeterminate microcalcifications in the upper outer left breast. Because of obesity they could not perform an image guided biopsy. She was referred for partial mastectomy.  Hospital long time, going very slowly today explaining the abnormality on the x-ray, its implications and the need for biopsy. The patient is comfortable with this.  Family history is negative for breast or ovarian cancer. HPI  Past Medical History  Diagnosis Date  . Obesity   . Sleep apnea   . Hyperlipemia   . Arthritis   . Diabetes mellitus type II     Past Surgical History  Procedure Date  . Cholecystectomy   . Eye surgery     Family History  Problem Relation Age of Onset  . Alcohol abuse Father     Social History History  Substance Use Topics  . Smoking status: Never Smoker   . Smokeless tobacco: Not on file  . Alcohol Use: No    Allergies  Allergen Reactions  . Darvocet (Propoxyphene-Acetaminophen) Anaphylaxis  . Penicillins     REACTION: rash    Current Outpatient Prescriptions  Medication Sig Dispense Refill  . albuterol (PROVENTIL HFA;VENTOLIN HFA) 108 (90 BASE) MCG/ACT inhaler Inhale 2 puffs into the lungs every 6 (six) hours as needed. Inhale 2 puffs every 6 hrs prn.  Inhale 2 puffs every  4-6 hrs spaced 60 sec apart.      . ALPRAZolam (XANAX) 1 MG tablet Take 1 tablet (1 mg total) by mouth at bedtime.  30 tablet  1  . atorvastatin (LIPITOR) 20 MG tablet Take 20 mg by mouth daily.      . insulin detemir (LEVEMIR) 100 UNIT/ML injection Inject 100 Units into the skin at bedtime. Inject 50 units into skin qhs.      . levothyroxine (SYNTHROID) 150 MCG tablet Take 150 mcg by mouth daily.      Marland Kitchen lisinopril (PRINIVIL,ZESTRIL) 5 MG tablet Take 5 mg by mouth daily.      . metFORMIN (GLUCOPHAGE) 1000 MG tablet Take 1,000 mg by mouth 2 (two) times daily with a meal.      . naproxen (NAPROSYN) 500 MG tablet Take 500 mg by mouth 2 (two) times daily with a meal.      . PARoxetine (PAXIL) 40 MG tablet Take 1 tablet (40 mg total) by mouth every morning.  30 tablet  1  . pioglitazone (ACTOS) 45 MG tablet Take 45 mg by mouth daily.      Marland Kitchen tiotropium (SPIRIVA) 18 MCG inhalation capsule Place 18 mcg into inhaler and inhale daily.      . traZODone (DESYREL) 100 MG tablet Take 2 at bed time  60 tablet  1  . ziprasidone (GEODON) 60 MG capsule Take 1 capsule (60 mg total) by mouth at bedtime.  30 capsule  1  . insulin glargine (LANTUS) 100 UNIT/ML injection Inject 36 Units into the skin at bedtime.        Review of Systems Review of Systems  Constitutional: Negative for fever, chills and unexpected weight change.  HENT: Negative for hearing loss, congestion, sore throat, trouble swallowing and voice change.   Eyes: Negative for visual disturbance.  Respiratory: Negative for cough and wheezing.   Cardiovascular: Negative for chest pain, palpitations and leg swelling.  Gastrointestinal: Negative for nausea, vomiting, abdominal pain, diarrhea, constipation, blood in stool, abdominal distention and anal bleeding.  Genitourinary: Negative for hematuria, vaginal bleeding and difficulty urinating.  Musculoskeletal: Negative for arthralgias.  Skin: Negative for rash and wound.  Neurological: Negative for  seizures, syncope and headaches.  Hematological: Negative for adenopathy. Does not bruise/bleed easily.  Psychiatric/Behavioral: Positive for hallucinations. Negative for confusion. The patient is nervous/anxious.     Blood pressure 130/62, pulse 72, temperature 96.6 F (35.9 C), temperature source Temporal, resp. rate 18, height 5\' 2"  (1.575 m), weight 278 lb 3.2 oz (126.191 kg).  Physical Exam Physical Exam  Constitutional: She is oriented to person, place, and time. She appears well-developed and well-nourished. No distress.       BMI 50.8  HENT:  Head: Normocephalic and atraumatic.  Nose: Nose normal.  Mouth/Throat: No oropharyngeal exudate.  Eyes: Conjunctivae normal and EOM are normal. Pupils are equal, round, and reactive to light. Left eye exhibits no discharge. No scleral icterus.  Neck: Neck supple. No JVD present. No tracheal deviation present. No thyromegaly present.  Cardiovascular: Normal rate, regular rhythm, normal heart sounds and intact distal pulses.   No murmur heard. Pulmonary/Chest: Effort normal and breath sounds normal. No respiratory distress. She has no wheezes. She has no rales. She exhibits no tenderness.       Breasts are large and pendulous. I had to examine her sitting up the car she gets orthopnea. I did not palpate any mass and no axillary adenopathy on either side. Skin is healthy.  Abdominal: Soft. Bowel sounds are normal. She exhibits no distension and no mass. There is no tenderness. There is no rebound and no guarding.  Musculoskeletal: She exhibits no edema and no tenderness.  Lymphadenopathy:    She has no cervical adenopathy.  Neurological: She is alert and oriented to person, place, and time. She exhibits normal muscle tone. Coordination normal.  Skin: Skin is warm. No rash noted. She is not diaphoretic. No erythema. No pallor.  Psychiatric: Her behavior is normal. Thought content normal.       The very flattened affect. Cooperative. Admittedly  nervous and fearful about the procedure, but is excepting of the need to have the biopsy. Insight is fair.    Data Reviewed Office notes from other physicians. Imaging studies.  Assessment    Abnormal mammogram left breast, upper outer quadrant, indeterminate microcalcifications. Agreed that excisional biopsy is indicated to rule out occult non-invasive cancer  Paranoid schizophrenia  Insulin-dependent diabetes  Obesity  Hypertension  Hyperlipidemia  History cholecystectomy    Plan    We'll schedule her for a left partial mastectomy with needle localization in the near future.  I discussed the indications, details, techniques, and numerous risk of the surgery with the patient and her sister. I have drawn diagrams and given patient information to him. They understand all these issues. All their questions were answered. They both agree with this plan.       Angelia Mould. Derrell Lolling, M.D., [facility] General and Minimally invasive Surgery Breast and Colorectal Surgery Office:   [phone] Pager:   [phone]  04/23/2012, 3:33 PM

## 2012-04-27 ENCOUNTER — Encounter (HOSPITAL_COMMUNITY): Payer: Self-pay | Admitting: Pharmacy Technician

## 2012-04-30 NOTE — Pre-Procedure Instructions (Signed)
Chelsea [name]  04/30/2012   Your procedure is scheduled on:  Monday May 10, 2012  Report to [facility] Stay [facility] at 12:00PM.  Call this number if you have problems the morning of surgery: [phone]   Remember:   Do not eat food or drink liquids after midnight.   Take these medicines the morning of surgery with A SIP OF WATER: albuterol, clonazepam, flonase, synthroid, paxil, spiriva,    Do not wear jewelry, make-up or nail polish.  Do not wear lotions, powders, or perfumes.  Do not shave 48 hours prior to surgery.  Do not bring valuables to the hospital.  Contacts, dentures or bridgework may not be worn into surgery.  Leave suitcase in the car. After surgery it may be brought to your room.     Patients discharged the day of surgery will not be allowed to drive home.  Name and phone number of your driver: family / friend  Special Instructions: Shower using CHG 2 nights before surgery and the night before surgery.  If you shower the day of surgery use CHG.  Use special wash - you have one bottle of CHG for all showers.  You should use approximately 1/3 of the bottle for each shower.   Please read over the following fact sheets that you were given: Pain Booklet, Coughing and Deep Breathing, MRSA Information and Surgical Site Infection Prevention

## 2012-05-03 ENCOUNTER — Encounter (HOSPITAL_COMMUNITY)
Admission: RE | Admit: 2012-05-03 | Discharge: 2012-05-03 | Disposition: A | Discharge status: Home / Self Care | Payer: Medicare Other | Source: Ambulatory Visit | Attending: General Surgery | Admitting: General Surgery

## 2012-05-03 ENCOUNTER — Ambulatory Visit (HOSPITAL_COMMUNITY)
Admission: RE | Admit: 2012-05-03 | Discharge: 2012-05-03 | Disposition: A | Discharge status: Home / Self Care | Payer: Medicare Other | Source: Ambulatory Visit | Attending: General Surgery | Admitting: General Surgery

## 2012-05-03 ENCOUNTER — Telehealth (INDEPENDENT_AMBULATORY_CARE_PROVIDER_SITE_OTHER): Payer: Self-pay

## 2012-05-03 ENCOUNTER — Encounter (HOSPITAL_COMMUNITY): Payer: Self-pay

## 2012-05-03 DIAGNOSIS — I10 Essential (primary) hypertension: Secondary | ICD-10-CM | POA: Insufficient documentation

## 2012-05-03 DIAGNOSIS — Z01818 Encounter for other preprocedural examination: Secondary | ICD-10-CM | POA: Diagnosis not present

## 2012-05-03 DIAGNOSIS — Z01810 Encounter for preprocedural cardiovascular examination: Secondary | ICD-10-CM | POA: Insufficient documentation

## 2012-05-03 DIAGNOSIS — Z87891 Personal history of nicotine dependence: Secondary | ICD-10-CM | POA: Insufficient documentation

## 2012-05-03 DIAGNOSIS — J438 Other emphysema: Secondary | ICD-10-CM | POA: Diagnosis not present

## 2012-05-03 DIAGNOSIS — R9431 Abnormal electrocardiogram [ECG] [EKG]: Secondary | ICD-10-CM | POA: Diagnosis not present

## 2012-05-03 DIAGNOSIS — Z01812 Encounter for preprocedural laboratory examination: Secondary | ICD-10-CM | POA: Diagnosis not present

## 2012-05-03 HISTORY — DX: Essential (primary) hypertension: I10

## 2012-05-03 HISTORY — DX: Shortness of breath: R06.02

## 2012-05-03 HISTORY — DX: Chronic obstructive pulmonary disease, unspecified: J44.9

## 2012-05-03 HISTORY — DX: Mental disorder, not otherwise specified: F99

## 2012-05-03 HISTORY — DX: Cardiac murmur, unspecified: R01.1

## 2012-05-03 HISTORY — DX: Anxiety disorder, unspecified: F41.9

## 2012-05-03 LAB — SURGICAL PCR SCREEN: MRSA, PCR: NEGATIVE

## 2012-05-03 LAB — COMPREHENSIVE METABOLIC PANEL
AST: 14 U/L (ref 0–37)
BUN: 23 mg/dL (ref 6–23)
CO2: 30 mEq/L (ref 19–32)
Calcium: 10.1 mg/dL (ref 8.4–10.5)
Creatinine, Ser: 0.98 mg/dL (ref 0.50–1.10)
GFR calc non Af Amer: 62 mL/min — ABNORMAL LOW (ref >90)

## 2012-05-03 LAB — CBC WITH DIFFERENTIAL/PLATELET
Hemoglobin: 11.7 g/dL — ABNORMAL LOW (ref 12.0–15.0)
Lymphocytes Relative: 28 % (ref 12–46)
Lymphs Abs: 1.7 10*3/uL (ref 0.7–4.0)
MCH: 29.5 pg (ref 26.0–34.0)
Monocytes Relative: 9 % (ref 3–12)
Neutro Abs: 3.4 10*3/uL (ref 1.7–7.7)
Neutrophils Relative %: 55 % (ref 43–77)
Platelets: 208 10*3/uL (ref 150–400)
RBC: 3.97 MIL/uL (ref 3.87–5.11)
WBC: 6.2 10*3/uL (ref 4.0–10.5)

## 2012-05-03 MED ORDER — CHLORHEXIDINE GLUCONATE 4 % EX LIQD: 1.0000 "application " | Freq: Once | CUTANEOUS | Status: DC

## 2012-05-03 NOTE — Telephone Encounter (Signed)
Shirley from pre-op called into office regarding Penicillin allergy.  Patient scheduled for mastectomy on 05/10/12 and order for Ancef has been ordered.  Paged Dr. Derrell Lolling and reviewed patient's drug allergies and reactions.  Per Dr. Derrell Lolling okay to continue with Ancef IV antibiotic.  Called Shirley @ Pre-Op Admin and made her aware that there's no changes to her antibiotic and to continue with Ancef.

## 2012-05-03 NOTE — Progress Notes (Signed)
Christy at Dr Jacinto Halim office made aware that patient is allergic to PCN and that Ancef was ordered for surgery. Stated she would inform Dr. Derrell Lolling

## 2012-05-04 NOTE — Consult Note (Signed)
Anesthesia Chart Review:  Patient is a 62 year old female scheduled for left partial mastectomy with needle localization for evaluation of indeterminate microcalcifications by Dr. Derrell Lolling on 05/10/12.  Because of her obesity, a image guided biopsy could not be performed.    History includes morbid obesity (BMI 51), former smoker, DM2, HLD, HTN, murmur (not specified), anxiety, Bipolar 1 disorder (paranoid schizophrenia per Dr. Jacinto Halim note), OSA, COPD, cholecystectomy, eye surgery.  Psychiatrist is Dr. Kathryne Sharper.  PCP is Dr. Durward Mallard Any with [facility], last visit 04/23/12.    Preoperative labs noted.  (HgbA1C on 04/23/12 was 6.8 at PCP office.)  CXR on 05/03/12 showed question COPD, no acute abnormalities.  EKG on 05/03/12 showed NSR with sinus arrhythmia, cannot rule out anterior infarct (age undetermined).  It was not felt significantly changed since her last EKG on 06/02/09.  No chest pain symptoms were documented at her PAT or recent PCP and [facility] visits.  (Of note she was seen at [facility] by Dr. Myrtis Ser in June 2009 for evaluation of chest discomfort.  By notes, she had a Cardiolite that raised question of mild ischemia.  A cardiac cath was going to be pursued to rule out CAD; however, she was volume overloaded at the time and diuresis caused a bump in her Cr from 1.3 to 2.2.  Cardiac cath was placed on hold.  By October 2009, her volume status and DOE had improved.  There was no documented chest pain, and his last note made no mention of pursuing cardiac cath.)  Echo on 11/04/10 (read by Dr. Jacinto Halim) showed very poor echo window (limited study due to body habitus).  No apical views.  Grossly LV internal dimension is moderately dilated.  Normal global wall motion.  Normal systolic global function.  Calculated EF 47%.  Visual EF 55-60%.  RV cavity is moderately enlarged.  Pericardial fat pad.  Patient has no documented CAD, but with multiple risk factors. She has had close  follow-up with her PCP who is aware of plans for breast biopsy.  As above, there has been no recent documented chest pain.  Her EKG is stable for at least the past two years.   Clinical correlation on the day of surgery.  If she remains asymptomatic from a CV standpoint then would anticipate she could proceed as planned.  I reviewed above with Anesthesiologist Dr. Krista Blue who agrees with this plan.  Shonna Chock, PA-C 05/05/12 1113

## 2012-05-05 ENCOUNTER — Other Ambulatory Visit (HOSPITAL_COMMUNITY): Payer: Self-pay | Admitting: Psychiatry

## 2012-05-05 DIAGNOSIS — F200 Paranoid schizophrenia: Secondary | ICD-10-CM

## 2012-05-08 NOTE — H&P (Signed)
Chelsea [name]    MRN:  [id number]   Description: 62 year old female  Provider: Ernestene Mention, MD  Department: [facility]        Diagnoses    Abnormal mammogram with microcalcification    -  Primary    793.81         Current Vitals     BP Pulse Temp(Src) Resp Ht Wt    130/62 72 96.6 F (35.9 C) (Temporal) 18 5\' 2"  (1.575 m) 278 lb 3.2 oz (126.191 kg)    BMI - 50.87 kg/m2                 History and Physical   Ernestene Mention, MD   Status: Signed                            HPI Chelsea [name] is a 62 y.o. female.  She is referred by Dr. Christiana Pellant at the breast center of [facility] for evaluation and management of an abnormal mammogram, showing calcifications in the upper outer quadrant of the left breast. Her primary care physician is Dr. Mardelle Matte at [facility]. Her psychiatrist is Dr. Kathryne Sharper.   This patient is under treatment for paranoid schizophrenia. Her sister is with her today. She has never had a breast problem in the past. She has not had a mammogram and cervical years. Recent screening mammograms and diagnostic mammograms showed a group of indeterminate microcalcifications in the upper outer left breast. Because of obesity they could not perform an image guided biopsy. She was referred for partial mastectomy.   Hospital long time, going very slowly today explaining the abnormality on the x-ray, its implications and the need for biopsy. The patient is comfortable with this.   Family history is negative for breast or ovarian cancer.       Past Medical History   Diagnosis  Date   .  Obesity     .  Sleep apnea     .  Hyperlipemia     .  Arthritis     .  Diabetes mellitus type II           Past Surgical History   Procedure  Date   .  Cholecystectomy     .  Eye surgery           Family History   Problem  Relation  Age of Onset   .  Alcohol abuse  Father          Social History History   Substance Use  Topics   .  Smoking status:  Never Smoker    .  Smokeless tobacco:  Not on file   .  Alcohol Use:  No         Allergies   Allergen  Reactions   .  Darvocet (Propoxyphene-Acetaminophen)  Anaphylaxis   .  Penicillins         REACTION: rash         Current Outpatient Prescriptions   Medication  Sig  Dispense  Refill   .  albuterol (PROVENTIL HFA;VENTOLIN HFA) 108 (90 BASE) MCG/ACT inhaler  Inhale 2 puffs into the lungs every 6 (six) hours as needed. Inhale 2 puffs every 6 hrs prn.  Inhale 2 puffs every 4-6 hrs spaced 60 sec apart.         .  ALPRAZolam (XANAX) 1 MG tablet  Take 1  tablet (1 mg total) by mouth at bedtime.   30 tablet   1   .  atorvastatin (LIPITOR) 20 MG tablet  Take 20 mg by mouth daily.         .  insulin detemir (LEVEMIR) 100 UNIT/ML injection  Inject 100 Units into the skin at bedtime. Inject 50 units into skin qhs.         .  levothyroxine (SYNTHROID) 150 MCG tablet  Take 150 mcg by mouth daily.         Marland Kitchen  lisinopril (PRINIVIL,ZESTRIL) 5 MG tablet  Take 5 mg by mouth daily.         .  metFORMIN (GLUCOPHAGE) 1000 MG tablet  Take 1,000 mg by mouth 2 (two) times daily with a meal.         .  naproxen (NAPROSYN) 500 MG tablet  Take 500 mg by mouth 2 (two) times daily with a meal.         .  PARoxetine (PAXIL) 40 MG tablet  Take 1 tablet (40 mg total) by mouth every morning.   30 tablet   1   .  pioglitazone (ACTOS) 45 MG tablet  Take 45 mg by mouth daily.         Marland Kitchen  tiotropium (SPIRIVA) 18 MCG inhalation capsule  Place 18 mcg into inhaler and inhale daily.         .  traZODone (DESYREL) 100 MG tablet  Take 2 at bed time   60 tablet   1   .  ziprasidone (GEODON) 60 MG capsule  Take 1 capsule (60 mg total) by mouth at bedtime.   30 capsule   1   .  insulin glargine (LANTUS) 100 UNIT/ML injection  Inject 36 Units into the skin at bedtime.              Review of Systems   Constitutional: Negative for fever, chills and unexpected weight change.  HENT: Negative for  hearing loss, congestion, sore throat, trouble swallowing and voice change.   Eyes: Negative for visual disturbance.  Respiratory: Negative for cough and wheezing.   Cardiovascular: Negative for chest pain, palpitations and leg swelling.  Gastrointestinal: Negative for nausea, vomiting, abdominal pain, diarrhea, constipation, blood in stool, abdominal distention and anal bleeding.  Genitourinary: Negative for hematuria, vaginal bleeding and difficulty urinating.  Musculoskeletal: Negative for arthralgias.  Skin: Negative for rash and wound.  Neurological: Negative for seizures, syncope and headaches.  Hematological: Negative for adenopathy. Does not bruise/bleed easily.  Psychiatric/Behavioral: Positive for hallucinations. Negative for confusion. The patient is nervous/anxious.       Blood pressure 130/62, pulse 72, temperature 96.6 F (35.9 C), temperature source Temporal, resp. rate 18, height 5\' 2"  (1.575 m), weight 278 lb 3.2 oz (126.191 kg).   Physical Exam   Constitutional: She is oriented to person, place, and time. She appears well-developed and well-nourished. No distress.       BMI 50.8  HENT:   Head: Normocephalic and atraumatic.   Nose: Nose normal.   Mouth/Throat: No oropharyngeal exudate.  Eyes: Conjunctivae normal and EOM are normal. Pupils are equal, round, and reactive to light. Left eye exhibits no discharge. No scleral icterus.  Neck: Neck supple. No JVD present. No tracheal deviation present. No thyromegaly present.  Cardiovascular: Normal rate, regular rhythm, normal heart sounds and intact distal pulses.    No murmur heard. Pulmonary/Chest: Effort normal and breath sounds normal. No respiratory distress. She has no wheezes.  She has no rales. She exhibits no tenderness.       Breasts are large and pendulous. I had to examine her sitting up the car she gets orthopnea. I did not palpate any mass and no axillary adenopathy on either side. Skin is healthy.   Abdominal: Soft. Bowel sounds are normal. She exhibits no distension and no mass. There is no tenderness. There is no rebound and no guarding.  Musculoskeletal: She exhibits no edema and no tenderness.  Lymphadenopathy:    She has no cervical adenopathy.  Neurological: She is alert and oriented to person, place, and time. She exhibits normal muscle tone. Coordination normal.  Skin: Skin is warm. No rash noted. She is not diaphoretic. No erythema. No pallor.  Psychiatric: Her behavior is normal. Thought content normal.       The very flattened affect. Cooperative. Admittedly nervous and fearful about the procedure, but is excepting of the need to have the biopsy. Insight is fair.      Data Reviewed Office notes from other physicians. Imaging studies.   Assessment    Abnormal mammogram left breast, upper outer quadrant, indeterminate microcalcifications. Agreed that excisional biopsy is indicated to rule out occult non-invasive cancer   Paranoid schizophrenia   Insulin-dependent diabetes   Obesity   Hypertension   Hyperlipidemia   History cholecystectomy     Plan    We'll schedule her for a left partial mastectomy with needle localization in the near future.   I discussed the indications, details, techniques, and numerous risk of the surgery with the patient and her sister. I have drawn diagrams and given patient information to him. They understand all these issues. All their questions were answered. They both agree with this plan.          Angelia Mould. Derrell Lolling, M.D., [facility] General and Minimally invasive Surgery Breast and Colorectal Surgery Office:   [phone] Pager:   [phone]

## 2012-05-10 ENCOUNTER — Encounter (HOSPITAL_COMMUNITY)
Admission: RE | Disposition: A | Discharge status: Home / Self Care | Payer: Self-pay | Source: Ambulatory Visit | Attending: General Surgery

## 2012-05-10 ENCOUNTER — Telehealth (INDEPENDENT_AMBULATORY_CARE_PROVIDER_SITE_OTHER): Payer: Self-pay

## 2012-05-10 ENCOUNTER — Ambulatory Visit
Admission: RE | Admit: 2012-05-10 | Discharge: 2012-05-10 | Disposition: A | Discharge status: Home / Self Care | Payer: Medicare Other | Source: Ambulatory Visit | Attending: General Surgery | Admitting: General Surgery

## 2012-05-10 ENCOUNTER — Ambulatory Visit (HOSPITAL_COMMUNITY): Payer: Medicare Other | Admitting: Vascular Surgery

## 2012-05-10 ENCOUNTER — Ambulatory Visit (HOSPITAL_COMMUNITY)
Admission: RE | Admit: 2012-05-10 | Discharge: 2012-05-10 | Disposition: A | Discharge status: Home / Self Care | Payer: Medicare Other | Source: Ambulatory Visit | Attending: General Surgery | Admitting: General Surgery

## 2012-05-10 ENCOUNTER — Encounter (HOSPITAL_COMMUNITY): Payer: Self-pay | Admitting: Certified Registered Nurse Anesthetist

## 2012-05-10 ENCOUNTER — Encounter (HOSPITAL_COMMUNITY): Payer: Self-pay | Admitting: Vascular Surgery

## 2012-05-10 DIAGNOSIS — F411 Generalized anxiety disorder: Secondary | ICD-10-CM | POA: Diagnosis not present

## 2012-05-10 DIAGNOSIS — E119 Type 2 diabetes mellitus without complications: Secondary | ICD-10-CM | POA: Insufficient documentation

## 2012-05-10 DIAGNOSIS — G4730 Sleep apnea, unspecified: Secondary | ICD-10-CM | POA: Diagnosis not present

## 2012-05-10 DIAGNOSIS — I10 Essential (primary) hypertension: Secondary | ICD-10-CM | POA: Diagnosis not present

## 2012-05-10 DIAGNOSIS — J449 Chronic obstructive pulmonary disease, unspecified: Secondary | ICD-10-CM | POA: Diagnosis not present

## 2012-05-10 DIAGNOSIS — J4489 Other specified chronic obstructive pulmonary disease: Secondary | ICD-10-CM | POA: Insufficient documentation

## 2012-05-10 DIAGNOSIS — R920 Mammographic microcalcification found on diagnostic imaging of breast: Secondary | ICD-10-CM | POA: Insufficient documentation

## 2012-05-10 DIAGNOSIS — N6019 Diffuse cystic mastopathy of unspecified breast: Secondary | ICD-10-CM | POA: Diagnosis not present

## 2012-05-10 DIAGNOSIS — R928 Other abnormal and inconclusive findings on diagnostic imaging of breast: Secondary | ICD-10-CM | POA: Diagnosis not present

## 2012-05-10 HISTORY — PX: BREAST EXCISIONAL BIOPSY: SUR124

## 2012-05-10 HISTORY — PX: PARTIAL MASTECTOMY WITH NEEDLE LOCALIZATION: SHX6008

## 2012-05-10 LAB — GLUCOSE, CAPILLARY
Glucose-Capillary: 89 mg/dL (ref 70–99)
Glucose-Capillary: 122 mg/dL — ABNORMAL HIGH (ref 70–99)

## 2012-05-10 SURGERY — PARTIAL MASTECTOMY WITH NEEDLE LOCALIZATION
Anesthesia: General | Site: Breast | Laterality: Left | Wound class: Clean

## 2012-05-10 MED ORDER — NEOSTIGMINE METHYLSULFATE 1 MG/ML IJ SOLN
INTRAMUSCULAR | Status: DC | PRN
  Administered 2012-05-10: 3 mg via INTRAVENOUS

## 2012-05-10 MED ORDER — PHENYLEPHRINE HCL 10 MG/ML IJ SOLN
INTRAMUSCULAR | Status: DC | PRN
  Administered 2012-05-10: 80 ug via INTRAVENOUS
  Administered 2012-05-10: 40 ug via INTRAVENOUS

## 2012-05-10 MED ORDER — HEPARIN SODIUM (PORCINE) 5000 UNIT/ML IJ SOLN
INTRAMUSCULAR | Status: AC
  Administered 2012-05-10: 5000 [IU] via SUBCUTANEOUS
  Filled 2012-05-10: qty 1

## 2012-05-10 MED ORDER — HEPARIN SODIUM (PORCINE) 5000 UNIT/ML IJ SOLN
5000.0000 [IU] | Freq: Once | INTRAMUSCULAR | Status: AC
  Administered 2012-05-10: 5000 [IU] via SUBCUTANEOUS

## 2012-05-10 MED ORDER — LIDOCAINE HCL (CARDIAC) 20 MG/ML IV SOLN
INTRAVENOUS | Status: DC | PRN
  Administered 2012-05-10: 80 mg via INTRAVENOUS

## 2012-05-10 MED ORDER — BUPIVACAINE HCL (PF) 0.25 % IJ SOLN
INTRAMUSCULAR | Status: DC | PRN
  Administered 2012-05-10: 10 mL

## 2012-05-10 MED ORDER — LACTATED RINGERS IV SOLN
INTRAVENOUS | Status: DC | PRN
  Administered 2012-05-10: 14:00:00 via INTRAVENOUS

## 2012-05-10 MED ORDER — HYDROCODONE-ACETAMINOPHEN 5-325 MG PO TABS: 1.0000 | ORAL_TABLET | ORAL | Status: DC | PRN

## 2012-05-10 MED ORDER — FENTANYL CITRATE 0.05 MG/ML IJ SOLN
INTRAMUSCULAR | Status: DC | PRN
  Administered 2012-05-10 (×2): 25 ug via INTRAVENOUS
  Administered 2012-05-10: 75 ug via INTRAVENOUS

## 2012-05-10 MED ORDER — 0.9 % SODIUM CHLORIDE (POUR BTL) OPTIME
TOPICAL | Status: DC | PRN
  Administered 2012-05-10: 1000 mL

## 2012-05-10 MED ORDER — PROPOFOL 10 MG/ML IV BOLUS
INTRAVENOUS | Status: DC | PRN
  Administered 2012-05-10: 150 mg via INTRAVENOUS

## 2012-05-10 MED ORDER — DEXTROSE 5 % IV SOLN
3.0000 g | INTRAVENOUS | Status: AC
  Administered 2012-05-10: 3 g via INTRAVENOUS

## 2012-05-10 MED ORDER — OXYCODONE HCL 5 MG/5ML PO SOLN: 5.0000 mg | Freq: Once | ORAL | Status: DC | PRN

## 2012-05-10 MED ORDER — OXYCODONE HCL 5 MG PO TABS: 5.0000 mg | ORAL_TABLET | Freq: Once | ORAL | Status: DC | PRN

## 2012-05-10 MED ORDER — GLYCOPYRROLATE 0.2 MG/ML IJ SOLN
INTRAMUSCULAR | Status: DC | PRN
  Administered 2012-05-10: 0.4 mg via INTRAVENOUS

## 2012-05-10 MED ORDER — HYDROMORPHONE HCL PF 1 MG/ML IJ SOLN: 0.2500 mg | INTRAMUSCULAR | Status: DC | PRN

## 2012-05-10 MED ORDER — KETOROLAC TROMETHAMINE 30 MG/ML IJ SOLN: 30.0000 mg | Freq: Four times a day (QID) | INTRAMUSCULAR | Status: DC

## 2012-05-10 MED ORDER — LACTATED RINGERS IV SOLN
INTRAVENOUS | Status: DC
  Administered 2012-05-10: 14:00:00 via INTRAVENOUS

## 2012-05-10 MED ORDER — DEXAMETHASONE SODIUM PHOSPHATE 4 MG/ML IJ SOLN
INTRAMUSCULAR | Status: DC | PRN
  Administered 2012-05-10: 4 mg via INTRAVENOUS

## 2012-05-10 MED ORDER — EPHEDRINE SULFATE 50 MG/ML IJ SOLN
INTRAMUSCULAR | Status: DC | PRN
  Administered 2012-05-10: 10 mg via INTRAVENOUS
  Administered 2012-05-10: 20 mg via INTRAVENOUS
  Administered 2012-05-10: 5 mg via INTRAVENOUS

## 2012-05-10 MED ORDER — ONDANSETRON HCL 4 MG/2ML IJ SOLN
INTRAMUSCULAR | Status: DC | PRN
  Administered 2012-05-10: 4 mg via INTRAVENOUS

## 2012-05-10 MED ORDER — SUCCINYLCHOLINE CHLORIDE 20 MG/ML IJ SOLN
INTRAMUSCULAR | Status: DC | PRN
  Administered 2012-05-10: 100 mg via INTRAVENOUS

## 2012-05-10 MED ORDER — CEFAZOLIN SODIUM 1-5 GM-% IV SOLN
INTRAVENOUS | Status: AC
  Filled 2012-05-10: qty 50

## 2012-05-10 MED ORDER — ROCURONIUM BROMIDE 100 MG/10ML IV SOLN
INTRAVENOUS | Status: DC | PRN
  Administered 2012-05-10: 20 mg via INTRAVENOUS

## 2012-05-10 MED ORDER — BUPIVACAINE-EPINEPHRINE PF 0.25-1:200000 % IJ SOLN
INTRAMUSCULAR | Status: AC
  Filled 2012-05-10: qty 30

## 2012-05-10 MED ORDER — ONDANSETRON HCL 4 MG/2ML IJ SOLN: 4.0000 mg | Freq: Once | INTRAMUSCULAR | Status: DC | PRN

## 2012-05-10 SURGICAL SUPPLY — 45 items
BINDER BREAST XXLRG (GAUZE/BANDAGES/DRESSINGS) ×2 IMPLANT
BLADE SURG 10 STRL SS (BLADE) ×2 IMPLANT
BLADE SURG 15 STRL LF DISP TIS (BLADE) ×1 IMPLANT
BLADE SURG 15 STRL SS (BLADE) ×1
CANISTER SUCTION 2500CC (MISCELLANEOUS) ×2 IMPLANT
CHLORAPREP W/TINT 26ML (MISCELLANEOUS) ×2 IMPLANT
CLOTH BEACON ORANGE TIMEOUT ST (SAFETY) ×2 IMPLANT
COVER SURGICAL LIGHT HANDLE (MISCELLANEOUS) ×2 IMPLANT
DERMABOND ADVANCED (GAUZE/BANDAGES/DRESSINGS) ×1
DERMABOND ADVANCED .7 DNX12 (GAUZE/BANDAGES/DRESSINGS) ×1 IMPLANT
DEVICE DUBIN SPECIMEN MAMMOGRA (MISCELLANEOUS) ×2 IMPLANT
DRAPE CHEST BREAST 15X10 FENES (DRAPES) ×2 IMPLANT
DRAPE UTILITY 15X26 W/TAPE STR (DRAPE) ×4 IMPLANT
ELECT CAUTERY BLADE 6.4 (BLADE) ×2 IMPLANT
ELECT REM PT RETURN 9FT ADLT (ELECTROSURGICAL) ×2
ELECTRODE REM PT RTRN 9FT ADLT (ELECTROSURGICAL) ×1 IMPLANT
GLOVE BIO SURGEON STRL SZ 6.5 (GLOVE) ×2 IMPLANT
GLOVE BIOGEL PI IND STRL 6 (GLOVE) ×1 IMPLANT
GLOVE BIOGEL PI IND STRL 6.5 (GLOVE) ×1 IMPLANT
GLOVE BIOGEL PI IND STRL 7.0 (GLOVE) ×2 IMPLANT
GLOVE BIOGEL PI INDICATOR 6 (GLOVE) ×1
GLOVE BIOGEL PI INDICATOR 6.5 (GLOVE) ×1
GLOVE BIOGEL PI INDICATOR 7.0 (GLOVE) ×2
GLOVE EUDERMIC 7 POWDERFREE (GLOVE) ×4 IMPLANT
GLOVE SURG SS PI 6.5 STRL IVOR (GLOVE) ×2 IMPLANT
GOWN PREVENTION PLUS XLARGE (GOWN DISPOSABLE) ×2 IMPLANT
GOWN STRL NON-REIN LRG LVL3 (GOWN DISPOSABLE) ×4 IMPLANT
KIT BASIN OR (CUSTOM PROCEDURE TRAY) ×2 IMPLANT
KIT MARKER MARGIN INK (KITS) ×2 IMPLANT
KIT ROOM TURNOVER OR (KITS) ×2 IMPLANT
NEEDLE HYPO 25GX1X1/2 BEV (NEEDLE) ×2 IMPLANT
NS IRRIG 1000ML POUR BTL (IV SOLUTION) ×2 IMPLANT
PACK SURGICAL SETUP 50X90 (CUSTOM PROCEDURE TRAY) ×2 IMPLANT
PAD ARMBOARD 7.5X6 YLW CONV (MISCELLANEOUS) ×2 IMPLANT
PENCIL BUTTON HOLSTER BLD 10FT (ELECTRODE) ×2 IMPLANT
SPONGE LAP 18X18 X RAY DECT (DISPOSABLE) ×2 IMPLANT
SUT MNCRL AB 4-0 PS2 18 (SUTURE) ×2 IMPLANT
SUT SILK 2 0 SH (SUTURE) ×2 IMPLANT
SUT VIC AB 3-0 SH 18 (SUTURE) ×2 IMPLANT
SYR BULB 3OZ (MISCELLANEOUS) ×2 IMPLANT
SYR CONTROL 10ML LL (SYRINGE) ×2 IMPLANT
TOWEL OR 17X24 6PK STRL BLUE (TOWEL DISPOSABLE) ×2 IMPLANT
TOWEL OR 17X26 10 PK STRL BLUE (TOWEL DISPOSABLE) ×2 IMPLANT
TUBE CONNECTING 12X1/4 (SUCTIONS) ×2 IMPLANT
YANKAUER SUCT BULB TIP NO VENT (SUCTIONS) ×2 IMPLANT

## 2012-05-10 NOTE — Anesthesia Postprocedure Evaluation (Signed)
  Anesthesia Post-op Note  Patient: Chelsea [name]  Procedure(s) Performed: Procedure(s): PARTIAL MASTECTOMY WITH NEEDLE LOCALIZATION (Left)  Patient Location: PACU  Anesthesia Type:General  Level of Consciousness: awake  Airway and Oxygen Therapy: Patient Spontanous Breathing  Post-op Pain: mild  Post-op Assessment: Post-op Vital signs reviewed  Post-op Vital Signs: stable  Complications: No apparent anesthesia complications

## 2012-05-10 NOTE — Telephone Encounter (Signed)
Per Dr Jacinto Halim request hydrocodone 5/325 #30 called to [facility].

## 2012-05-10 NOTE — Anesthesia Procedure Notes (Signed)
Procedure Name: Intubation Date/Time: 05/10/2012 2:25 PM Performed by: Margaree Mackintosh Pre-anesthesia Checklist: Patient identified, Timeout performed, Emergency Drugs available, Patient being monitored and Suction available Patient Re-evaluated:Patient Re-evaluated prior to inductionOxygen Delivery Method: Circle system utilized Preoxygenation: Pre-oxygenation with 100% oxygen Intubation Type: IV induction Ventilation: Mask ventilation without difficulty Laryngoscope Size: Mac and 3 Grade View: Grade I Tube size: 7.0 mm Number of attempts: 1 Airway Equipment and Method: Stylet Placement Confirmation: ETT inserted through vocal cords under direct vision,  positive ETCO2 and breath sounds checked- equal and bilateral Secured at: 20 cm Tube secured with: Tape Dental Injury: Teeth and Oropharynx as per pre-operative assessment

## 2012-05-10 NOTE — Interval H&P Note (Signed)
History and Physical Interval Note:  05/10/2012 2:04 PM  Chelsea [name]  has presented today for surgery, with the diagnosis of Abnormal mammogram left breast  The goals and the various methods of treatment have been discussed with the patient and family. After consideration of risks, benefits and other options for treatment, the patient has consented to  Procedure(s): PARTIAL MASTECTOMY WITH NEEDLE LOCALIZATION (Left) as a surgical intervention .  The patient's history has been reviewed, patient examined today, no change in status, stable for surgery.  I have reviewed the patient's chart and labs.  Questions were answered to the patient's satisfaction.     Ernestene Mention

## 2012-05-10 NOTE — Preoperative (Signed)
Beta Blockers   Reason not to administer Beta Blockers:Not Applicable 

## 2012-05-10 NOTE — Anesthesia Preprocedure Evaluation (Signed)
Anesthesia Evaluation  Patient identified by MRN, date of birth, ID band Patient awake    Reviewed: Allergy & Precautions, H&P , NPO status , Patient's Chart, lab work & pertinent test results  History of Anesthesia Complications Negative for: history of anesthetic complications  Airway Mallampati: II  Neck ROM: Full    Dental  (+) Edentulous Upper and Edentulous Lower   Pulmonary shortness of breath, sleep apnea , COPD breath sounds clear to auscultation        Cardiovascular hypertension, + Valvular Problems/Murmurs Rhythm:Regular Rate:Normal     Neuro/Psych Anxiety    GI/Hepatic negative GI ROS, Neg liver ROS,   Endo/Other  diabetesMorbid obesity  Renal/GU negative Renal ROS     Musculoskeletal negative musculoskeletal ROS (+)   Abdominal (+) + obese,   Peds  Hematology negative hematology ROS (+)   Anesthesia Other Findings   Reproductive/Obstetrics                           Anesthesia Physical Anesthesia Plan  ASA: III  Anesthesia Plan: General   Post-op Pain Management:    Induction: Intravenous  Airway Management Planned: Oral ETT  Additional Equipment:   Intra-op Plan:   Post-operative Plan: Extubation in OR  Informed Consent: I have reviewed the patients History and Physical, chart, labs and discussed the procedure including the risks, benefits and alternatives for the proposed anesthesia with the patient or authorized representative who has indicated his/her understanding and acceptance.   Dental advisory given  Plan Discussed with: CRNA and Surgeon  Anesthesia Plan Comments:         Anesthesia Quick Evaluation

## 2012-05-10 NOTE — Op Note (Signed)
Patient Name:           Chelsea [name]   Date of Surgery:        05/10/2012  Pre op Diagnosis:      Indeterminate microcalcifications left breast, upper outer quadrant  Post op Diagnosis:    Same  Procedure:                Left partial mastectomy with needle localization  Surgeon:                     Angelia Mould. Derrell Lolling, M.D., FACS  Assistant:                      None  Operative Indications:   Chelsea [name] is a 62 y.o. female. She is referred by Dr. Christiana Pellant at the breast center of [facility] for evaluation and management of an abnormal mammogram, showing calcifications in the upper outer quadrant of the left breast. Her primary care physician is Dr. Mardelle Matte at [facility]. Her psychiatrist is Dr. Kathryne Sharper.  This patient is under treatment for paranoid schizophrenia. She has never had a breast problem in the past. She has not had a mammogram and several years. Recent screening mammograms and diagnostic mammograms showed a group of indeterminate microcalcifications in the upper outer left breast. Because of obesity they could not perform an image guided biopsy. She was referred for partial mastectomy.  I explained the abnormality on the x-ray, its implications and the need for biopsy. The patient is comfortable with this.  Family history is negative for breast or ovarian cance  Operative Findings:       There was a cluster of microcalcifications in the upper outer quadrant of the left breast. The microcalcifications and the localizing wire were within the center of the specimen and did not seem to be near the edge.  Procedure in Detail:          The patient underwent placement of a localizing wire at the breast center [facility] by Dr. Micheline Maze. The wire was well placed and he went through and beyond the calcifications. The patient was taken to the operating room and general anesthesia was induced. Intravenous antibiotics were given. The left breast was prepped and draped in a sterile  fashion. 0.5% Marcaine with epinephrine was used as local infiltration infiltration anesthetic.  The localizing wire was in the left breast in the upper-outer quadrant. I made a curvilinear, circumareolar incision through the insertion site of the wire. Dissection was carried down into the breast tissue around the localizing wire, and the involved breast tissue was completely removed. The specimen was marked with silk sutures and a 6 color ink kit. Specimen mammogram looked good as described above and the specimen was sent to lab. Hemostasis was excellent and achieved with electrocautery. The wound was irrigated with saline. The breast tissues were closed in multiple layers with interrupted sutures of 3-0 Vicryl and the skin was closed with a running subcuticular suture of 4-0 Monocryl and Dermabond. A breast binder was placed and the patient taken to recovery in stable condition. EBL 15 cc. Counts correct. Complications none.     Angelia Mould. Derrell Lolling, M.D., FACS General and Minimally Invasive Surgery Breast and Colorectal Surgery  05/10/2012 3:24 PM

## 2012-05-10 NOTE — Transfer of Care (Signed)
Immediate Anesthesia Transfer of Care Note  Patient: Chelsea [name]  Procedure(s) Performed: Procedure(s): PARTIAL MASTECTOMY WITH NEEDLE LOCALIZATION (Left)  Patient Location: PACU  Anesthesia Type:General  Level of Consciousness: awake, alert  and oriented  Airway & Oxygen Therapy: Patient Spontanous Breathing  Post-op Assessment: Report given to PACU RN and Post -op Vital signs reviewed and stable  Post vital signs: Reviewed and stable  Complications: No apparent anesthesia complications

## 2012-05-11 ENCOUNTER — Encounter (HOSPITAL_COMMUNITY): Payer: Self-pay | Admitting: General Surgery

## 2012-05-12 ENCOUNTER — Telehealth (INDEPENDENT_AMBULATORY_CARE_PROVIDER_SITE_OTHER): Payer: Self-pay | Admitting: General Surgery

## 2012-05-12 NOTE — Telephone Encounter (Signed)
Patient called back and given path results.

## 2012-05-12 NOTE — Progress Notes (Signed)
Quick Note:  Inform patient of Pathology report,.Tell her that the pathology specimen shows no cancer, obviously good news. Will discuss further at her next office visit. ______

## 2012-05-12 NOTE — Telephone Encounter (Signed)
Called and left message for patient to call back. Patient to be advised of pathology results per Dr. Derrell Lolling:  "Tell her that the pathology specimen shows no cancer, obviously good news. Will discuss futher at her next office visit."  Patient already scheduled to be seen by Dr. Derrell Lolling on 05/27/12 at 10:30.

## 2012-05-14 ENCOUNTER — Ambulatory Visit (HOSPITAL_COMMUNITY): Payer: Self-pay | Admitting: Psychiatry

## 2012-05-17 ENCOUNTER — Ambulatory Visit (HOSPITAL_COMMUNITY): Payer: Self-pay | Admitting: Psychiatry

## 2012-05-21 ENCOUNTER — Ambulatory Visit (HOSPITAL_COMMUNITY): Payer: Self-pay | Admitting: Psychiatry

## 2012-05-27 ENCOUNTER — Encounter (INDEPENDENT_AMBULATORY_CARE_PROVIDER_SITE_OTHER): Payer: Self-pay | Admitting: General Surgery

## 2012-05-27 ENCOUNTER — Ambulatory Visit (INDEPENDENT_AMBULATORY_CARE_PROVIDER_SITE_OTHER): Payer: Medicare Other | Admitting: General Surgery

## 2012-05-27 VITALS — BP 136/82 | HR 60 | Temp 98.0°F | Resp 18 | Ht 62.0 in | Wt 286.0 lb

## 2012-05-27 DIAGNOSIS — R920 Mammographic microcalcification found on diagnostic imaging of breast: Secondary | ICD-10-CM

## 2012-05-27 NOTE — Patient Instructions (Signed)
Your left breast biopsy shows no cancer. We removed the abnormal calcifications, but there was no other abnormality.  Your wound is healing normally. You may bathe and wear normal clothes.  Be sure to get a mammogram every year and be sure that your regular physician  examines your breast annually.  Return to see Dr. Derrell Lolling if further problems arise.

## 2012-05-27 NOTE — Progress Notes (Signed)
Patient ID: Chelsea [name], female   DOB: [birth year], 62 y.o.   MRN: [id number] History: This patient underwent left partial mastectomy in the upper outer quadrant of the left breast on 05/10/2012. Specimen mammogram confirmed that the targeted calcifications and localizing wire were contained within the specimen. Final pathology report showed only benign breast tissue, no atypia. I advised the patient of the pathology report and she is pleased. She has no complaints about her breast or the incision.  Exam:. Patient is in no distress. Alert. Left breast is large. Incision is healing uneventfully. No hematoma, seroma, or infection  Assessment: Abnormal mammogram left breast with microcalcifications upper outer quadrant. Histologically this is confirmed as benign breast tissue Recovering uneventfully following left partial mastectomy with needle localization Multiple medical and psychiatric problems.  Plan: Annual breast exam with her primary care physician Annual mammography Return to see me if further problems arise.    Angelia Mould. Derrell Lolling, M.D., [facility] General and Minimally invasive Surgery Breast and Colorectal Surgery Office:   [phone] Pager:   [phone]

## 2012-05-28 ENCOUNTER — Ambulatory Visit (INDEPENDENT_AMBULATORY_CARE_PROVIDER_SITE_OTHER): Payer: Medicaid Other | Admitting: Psychiatry

## 2012-05-28 ENCOUNTER — Encounter (HOSPITAL_COMMUNITY): Payer: Self-pay | Admitting: Psychiatry

## 2012-05-28 VITALS — BP 154/72 | HR 60 | Wt 286.0 lb

## 2012-05-28 DIAGNOSIS — F200 Paranoid schizophrenia: Secondary | ICD-10-CM

## 2012-05-28 MED ORDER — TRAZODONE HCL 100 MG PO TABS: ORAL_TABLET | ORAL | Status: DC

## 2012-05-28 MED ORDER — ZIPRASIDONE HCL 60 MG PO CAPS: ORAL_CAPSULE | ORAL | Status: DC

## 2012-05-28 MED ORDER — PAROXETINE HCL 40 MG PO TABS: ORAL_TABLET | ORAL | Status: DC
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
## 2012-06-03 DIAGNOSIS — J449 Chronic obstructive pulmonary disease, unspecified: Secondary | ICD-10-CM | POA: Diagnosis not present

## 2012-06-03 DIAGNOSIS — G4700 Insomnia, unspecified: Secondary | ICD-10-CM | POA: Diagnosis not present

## 2012-06-21 ENCOUNTER — Ambulatory Visit (INDEPENDENT_AMBULATORY_CARE_PROVIDER_SITE_OTHER): Payer: Medicare Other | Admitting: Psychiatry

## 2012-06-21 ENCOUNTER — Encounter (HOSPITAL_COMMUNITY): Payer: Self-pay | Admitting: Psychiatry

## 2012-06-21 DIAGNOSIS — F200 Paranoid schizophrenia: Secondary | ICD-10-CM

## 2012-06-21 MED ORDER — CLONAZEPAM 1 MG PO TABS: ORAL_TABLET | ORAL | Status: DC
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
## 2012-06-28 ENCOUNTER — Telehealth (HOSPITAL_COMMUNITY): Payer: Self-pay | Admitting: *Deleted

## 2012-06-28 NOTE — Telephone Encounter (Signed)
See notes

## 2012-06-30 ENCOUNTER — Ambulatory Visit (HOSPITAL_COMMUNITY): Payer: Self-pay | Admitting: Psychiatry

## 2012-07-05 ENCOUNTER — Other Ambulatory Visit (HOSPITAL_COMMUNITY): Payer: Self-pay | Admitting: *Deleted

## 2012-07-05 DIAGNOSIS — F200 Paranoid schizophrenia: Secondary | ICD-10-CM

## 2012-07-05 MED ORDER — PAROXETINE HCL 40 MG PO TABS: ORAL_TABLET | ORAL | Status: DC

## 2012-07-23 DIAGNOSIS — G4700 Insomnia, unspecified: Secondary | ICD-10-CM | POA: Diagnosis not present

## 2012-07-23 DIAGNOSIS — E782 Mixed hyperlipidemia: Secondary | ICD-10-CM | POA: Diagnosis not present

## 2012-07-23 DIAGNOSIS — J449 Chronic obstructive pulmonary disease, unspecified: Secondary | ICD-10-CM | POA: Diagnosis not present

## 2012-07-23 DIAGNOSIS — E1165 Type 2 diabetes mellitus with hyperglycemia: Secondary | ICD-10-CM | POA: Diagnosis not present

## 2012-08-06 ENCOUNTER — Encounter (HOSPITAL_COMMUNITY): Payer: Self-pay | Admitting: Psychiatry

## 2012-08-06 ENCOUNTER — Ambulatory Visit (INDEPENDENT_AMBULATORY_CARE_PROVIDER_SITE_OTHER): Payer: Medicare Other | Admitting: Psychiatry

## 2012-08-06 VITALS — BP 150/90 | Wt 292.0 lb

## 2012-08-06 DIAGNOSIS — F200 Paranoid schizophrenia: Secondary | ICD-10-CM

## 2012-08-06 MED ORDER — TRAZODONE HCL 100 MG PO TABS: ORAL_TABLET | ORAL | Status: DC

## 2012-08-06 MED ORDER — ZIPRASIDONE HCL 80 MG PO CAPS: ORAL_CAPSULE | ORAL | Status: DC
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
## 2012-08-18 ENCOUNTER — Other Ambulatory Visit (HOSPITAL_COMMUNITY): Payer: Self-pay | Admitting: Psychiatry

## 2012-09-01 ENCOUNTER — Other Ambulatory Visit (HOSPITAL_COMMUNITY): Payer: Self-pay | Admitting: Psychiatry

## 2012-09-23 ENCOUNTER — Other Ambulatory Visit (HOSPITAL_COMMUNITY): Payer: Self-pay | Admitting: Psychiatry

## 2012-09-23 DIAGNOSIS — F200 Paranoid schizophrenia: Secondary | ICD-10-CM

## 2012-10-15 ENCOUNTER — Other Ambulatory Visit (HOSPITAL_COMMUNITY): Payer: Self-pay | Admitting: Psychiatry

## 2012-10-15 DIAGNOSIS — F200 Paranoid schizophrenia: Secondary | ICD-10-CM

## 2012-10-19 ENCOUNTER — Other Ambulatory Visit (HOSPITAL_COMMUNITY): Payer: Self-pay | Admitting: Psychiatry

## 2012-10-27 ENCOUNTER — Other Ambulatory Visit (HOSPITAL_COMMUNITY): Payer: Self-pay | Admitting: Psychiatry

## 2012-11-05 ENCOUNTER — Encounter (HOSPITAL_COMMUNITY): Payer: Self-pay | Admitting: Psychiatry

## 2012-11-05 ENCOUNTER — Ambulatory Visit (INDEPENDENT_AMBULATORY_CARE_PROVIDER_SITE_OTHER): Payer: Medicare Other | Admitting: Psychiatry

## 2012-11-05 VITALS — BP 140/90 | HR 76 | Resp 12 | Ht 62.0 in | Wt 312.0 lb

## 2012-11-05 DIAGNOSIS — F200 Paranoid schizophrenia: Secondary | ICD-10-CM

## 2012-11-05 MED ORDER — TRAZODONE HCL 100 MG PO TABS: ORAL_TABLET | ORAL | Status: DC

## 2012-11-05 MED ORDER — ZIPRASIDONE HCL 80 MG PO CAPS: ORAL_CAPSULE | ORAL | Status: DC

## 2012-11-05 MED ORDER — PAROXETINE HCL 40 MG PO TABS: ORAL_TABLET | ORAL | Status: DC
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
## 2012-11-12 ENCOUNTER — Ambulatory Visit (HOSPITAL_COMMUNITY): Payer: Self-pay | Admitting: Psychiatry

## 2012-11-26 ENCOUNTER — Ambulatory Visit (HOSPITAL_COMMUNITY): Payer: Self-pay | Admitting: Psychiatry

## 2012-12-03 ENCOUNTER — Encounter (HOSPITAL_COMMUNITY): Payer: Self-pay | Admitting: Radiology

## 2012-12-03 ENCOUNTER — Observation Stay (HOSPITAL_COMMUNITY): Payer: Medicare Other

## 2012-12-03 ENCOUNTER — Encounter (HOSPITAL_COMMUNITY): Payer: Self-pay | Admitting: Psychiatry

## 2012-12-03 ENCOUNTER — Observation Stay (HOSPITAL_COMMUNITY)
Admission: EM | Admit: 2012-12-03 | Discharge: 2012-12-04 | Disposition: A | Discharge status: Home / Self Care | Payer: Medicare Other | Attending: Internal Medicine | Admitting: Internal Medicine

## 2012-12-03 ENCOUNTER — Emergency Department (HOSPITAL_COMMUNITY): Payer: Medicare Other

## 2012-12-03 ENCOUNTER — Ambulatory Visit (INDEPENDENT_AMBULATORY_CARE_PROVIDER_SITE_OTHER): Payer: Medicare Other | Admitting: Psychiatry

## 2012-12-03 VITALS — Wt 319.0 lb

## 2012-12-03 DIAGNOSIS — E119 Type 2 diabetes mellitus without complications: Secondary | ICD-10-CM | POA: Diagnosis present

## 2012-12-03 DIAGNOSIS — J4489 Other specified chronic obstructive pulmonary disease: Secondary | ICD-10-CM | POA: Insufficient documentation

## 2012-12-03 DIAGNOSIS — R600 Localized edema: Secondary | ICD-10-CM | POA: Diagnosis present

## 2012-12-03 DIAGNOSIS — Z79899 Other long term (current) drug therapy: Secondary | ICD-10-CM | POA: Diagnosis not present

## 2012-12-03 DIAGNOSIS — E8881 Metabolic syndrome: Secondary | ICD-10-CM | POA: Diagnosis not present

## 2012-12-03 DIAGNOSIS — G4730 Sleep apnea, unspecified: Secondary | ICD-10-CM | POA: Diagnosis not present

## 2012-12-03 DIAGNOSIS — R0609 Other forms of dyspnea: Secondary | ICD-10-CM

## 2012-12-03 DIAGNOSIS — F200 Paranoid schizophrenia: Secondary | ICD-10-CM | POA: Diagnosis not present

## 2012-12-03 DIAGNOSIS — I10 Essential (primary) hypertension: Secondary | ICD-10-CM | POA: Diagnosis present

## 2012-12-03 DIAGNOSIS — R0600 Dyspnea, unspecified: Secondary | ICD-10-CM | POA: Diagnosis present

## 2012-12-03 DIAGNOSIS — E785 Hyperlipidemia, unspecified: Secondary | ICD-10-CM | POA: Diagnosis not present

## 2012-12-03 DIAGNOSIS — J449 Chronic obstructive pulmonary disease, unspecified: Secondary | ICD-10-CM | POA: Diagnosis not present

## 2012-12-03 DIAGNOSIS — R0602 Shortness of breath: Secondary | ICD-10-CM | POA: Diagnosis present

## 2012-12-03 DIAGNOSIS — R609 Edema, unspecified: Secondary | ICD-10-CM | POA: Diagnosis present

## 2012-12-03 DIAGNOSIS — R920 Mammographic microcalcification found on diagnostic imaging of breast: Secondary | ICD-10-CM

## 2012-12-03 DIAGNOSIS — R079 Chest pain, unspecified: Secondary | ICD-10-CM | POA: Diagnosis not present

## 2012-12-03 DIAGNOSIS — E782 Mixed hyperlipidemia: Secondary | ICD-10-CM | POA: Diagnosis not present

## 2012-12-03 DIAGNOSIS — R42 Dizziness and giddiness: Secondary | ICD-10-CM

## 2012-12-03 DIAGNOSIS — R0789 Other chest pain: Secondary | ICD-10-CM | POA: Diagnosis not present

## 2012-12-03 DIAGNOSIS — E669 Obesity, unspecified: Secondary | ICD-10-CM | POA: Diagnosis not present

## 2012-12-03 LAB — COMPREHENSIVE METABOLIC PANEL
ALT: 10 U/L (ref 0–35)
Albumin: 3.3 g/dL — ABNORMAL LOW (ref 3.5–5.2)
Alkaline Phosphatase: 77 U/L (ref 39–117)
BUN: 21 mg/dL (ref 6–23)
Calcium: 9.1 mg/dL (ref 8.4–10.5)
Potassium: 4.2 mEq/L (ref 3.5–5.1)
Sodium: 138 mEq/L (ref 135–145)
Total Protein: 6.7 g/dL (ref 6.0–8.3)

## 2012-12-03 LAB — GLUCOSE, CAPILLARY: Glucose-Capillary: 173 mg/dL — ABNORMAL HIGH (ref 70–99)

## 2012-12-03 LAB — URINALYSIS, ROUTINE W REFLEX MICROSCOPIC
Glucose, UA: NEGATIVE mg/dL
Ketones, ur: NEGATIVE mg/dL
Nitrite: NEGATIVE
Specific Gravity, Urine: 1.017 (ref 1.005–1.030)
pH: 5.5 (ref 5.0–8.0)

## 2012-12-03 LAB — CBC WITH DIFFERENTIAL/PLATELET
Basophils Absolute: 0 10*3/uL (ref 0.0–0.1)
Eosinophils Relative: 5 % (ref 0–5)
HCT: 35.2 % — ABNORMAL LOW (ref 36.0–46.0)
Hemoglobin: 11.3 g/dL — ABNORMAL LOW (ref 12.0–15.0)
Lymphocytes Relative: 28 % (ref 12–46)
MCV: 90 fL (ref 78.0–100.0)
Monocytes Absolute: 0.5 10*3/uL (ref 0.1–1.0)
Monocytes Relative: 7 % (ref 3–12)
Neutro Abs: 3.5 10*3/uL (ref 1.7–7.7)
RDW: 14.6 % (ref 11.5–15.5)
WBC: 6.1 10*3/uL (ref 4.0–10.5)

## 2012-12-03 LAB — POCT I-STAT TROPONIN I

## 2012-12-03 LAB — TROPONIN I: Troponin I: <0.3 ng/mL (ref <0.30)

## 2012-12-03 LAB — URINE MICROSCOPIC-ADD ON

## 2012-12-03 MED ORDER — ALUM & MAG HYDROXIDE-SIMETH 200-200-20 MG/5ML PO SUSP: 30.0000 mL | Freq: Four times a day (QID) | ORAL | Status: DC | PRN

## 2012-12-03 MED ORDER — SODIUM CHLORIDE 0.9 % IJ SOLN
3.0000 mL | Freq: Two times a day (BID) | INTRAMUSCULAR | Status: DC
  Administered 2012-12-03 – 2012-12-04 (×2): 3 mL via INTRAVENOUS

## 2012-12-03 MED ORDER — MAGNESIUM HYDROXIDE 400 MG/5ML PO SUSP: 30.0000 mL | Freq: Every day | ORAL | Status: DC | PRN

## 2012-12-03 MED ORDER — LISINOPRIL 5 MG PO TABS
5.0000 mg | ORAL_TABLET | Freq: Every day | ORAL | Status: DC
  Administered 2012-12-03 – 2012-12-04 (×2): 5 mg via ORAL
  Filled 2012-12-03 (×2): qty 1

## 2012-12-03 MED ORDER — TECHNETIUM TC 99M DIETHYLENETRIAME-PENTAACETIC ACID: 40.0000 | Freq: Once | INTRAVENOUS | Status: AC | PRN

## 2012-12-03 MED ORDER — LEVOTHYROXINE SODIUM 150 MCG PO TABS
150.0000 ug | ORAL_TABLET | Freq: Every day | ORAL | Status: DC
Start: 2012-12-04 — End: 2012-12-04
  Administered 2012-12-04: 150 ug via ORAL
  Filled 2012-12-03 (×2): qty 1

## 2012-12-03 MED ORDER — SODIUM CHLORIDE 0.9 % IJ SOLN
3.0000 mL | Freq: Two times a day (BID) | INTRAMUSCULAR | Status: DC
  Administered 2012-12-03 – 2012-12-04 (×2): 3 mL via INTRAVENOUS

## 2012-12-03 MED ORDER — INSULIN DETEMIR 100 UNIT/ML ~~LOC~~ SOLN
40.0000 [IU] | Freq: Every day | SUBCUTANEOUS | Status: DC
  Administered 2012-12-03: 40 [IU] via SUBCUTANEOUS
  Filled 2012-12-03 (×2): qty 0.4

## 2012-12-03 MED ORDER — ENOXAPARIN SODIUM 150 MG/ML ~~LOC~~ SOLN
1.0000 mg/kg | Freq: Two times a day (BID) | SUBCUTANEOUS | Status: DC
  Administered 2012-12-03: 145 mg via SUBCUTANEOUS
  Filled 2012-12-03 (×2): qty 1

## 2012-12-03 MED ORDER — SODIUM CHLORIDE 0.9 % IV SOLN: 250.0000 mL | INTRAVENOUS | Status: DC | PRN

## 2012-12-03 MED ORDER — CLONAZEPAM 1 MG PO TABS: ORAL_TABLET | ORAL | Status: DC

## 2012-12-03 MED ORDER — ALBUTEROL SULFATE HFA 108 (90 BASE) MCG/ACT IN AERS
2.0000 | INHALATION_SPRAY | Freq: Four times a day (QID) | RESPIRATORY_TRACT | Status: DC | PRN
  Filled 2012-12-03: qty 6.7

## 2012-12-03 MED ORDER — INSULIN ASPART 100 UNIT/ML ~~LOC~~ SOLN
6.0000 [IU] | Freq: Three times a day (TID) | SUBCUTANEOUS | Status: DC
  Administered 2012-12-04: 6 [IU] via SUBCUTANEOUS

## 2012-12-03 MED ORDER — PAROXETINE HCL 20 MG PO TABS
40.0000 mg | ORAL_TABLET | Freq: Every day | ORAL | Status: DC
  Administered 2012-12-04: 40 mg via ORAL
  Filled 2012-12-03 (×2): qty 2

## 2012-12-03 MED ORDER — TECHNETIUM TO 99M ALBUMIN AGGREGATED
6.0000 | Freq: Once | INTRAVENOUS | Status: AC | PRN
  Administered 2012-12-03: 6 via INTRAVENOUS

## 2012-12-03 MED ORDER — SODIUM CHLORIDE 0.9 % IJ SOLN: 3.0000 mL | INTRAMUSCULAR | Status: DC | PRN

## 2012-12-03 MED ORDER — ASPIRIN 81 MG PO CHEW
81.0000 mg | CHEWABLE_TABLET | Freq: Every day | ORAL | Status: DC
  Administered 2012-12-04: 81 mg via ORAL
  Filled 2012-12-03: qty 1

## 2012-12-03 MED ORDER — ATORVASTATIN CALCIUM 20 MG PO TABS
20.0000 mg | ORAL_TABLET | Freq: Every day | ORAL | Status: DC
  Administered 2012-12-03 – 2012-12-04 (×2): 20 mg via ORAL
  Filled 2012-12-03 (×2): qty 1

## 2012-12-03 MED ORDER — ACETAMINOPHEN 325 MG PO TABS: 650.0000 mg | ORAL_TABLET | Freq: Four times a day (QID) | ORAL | Status: DC | PRN

## 2012-12-03 MED ORDER — IOHEXOL 350 MG/ML SOLN
100.0000 mL | Freq: Once | INTRAVENOUS | Status: AC | PRN
  Administered 2012-12-03: 100 mL via INTRAVENOUS

## 2012-12-03 MED ORDER — INSULIN ASPART 100 UNIT/ML ~~LOC~~ SOLN: 0.0000 [IU] | Freq: Three times a day (TID) | SUBCUTANEOUS | Status: DC

## 2012-12-03 MED ORDER — ONDANSETRON HCL 4 MG/2ML IJ SOLN: 4.0000 mg | Freq: Four times a day (QID) | INTRAMUSCULAR | Status: DC | PRN

## 2012-12-03 MED ORDER — CLONAZEPAM 1 MG PO TABS
1.0000 mg | ORAL_TABLET | Freq: Every evening | ORAL | Status: DC | PRN
  Administered 2012-12-03: 22:00:00 1 mg via ORAL
  Filled 2012-12-03: qty 1

## 2012-12-03 MED ORDER — FLUTICASONE PROPIONATE 50 MCG/ACT NA SUSP
2.0000 | Freq: Every day | NASAL | Status: DC
  Administered 2012-12-04: 2 via NASAL
  Filled 2012-12-03: qty 16

## 2012-12-03 MED ORDER — ACETAMINOPHEN 650 MG RE SUPP: 650.0000 mg | Freq: Four times a day (QID) | RECTAL | Status: DC | PRN

## 2012-12-03 MED ORDER — ONDANSETRON HCL 4 MG PO TABS: 4.0000 mg | ORAL_TABLET | Freq: Four times a day (QID) | ORAL | Status: DC | PRN

## 2012-12-03 MED ORDER — TRAZODONE HCL 100 MG PO TABS
200.0000 mg | ORAL_TABLET | Freq: Every day | ORAL | Status: DC
  Administered 2012-12-03: 22:00:00 200 mg via ORAL
  Filled 2012-12-03 (×2): qty 2

## 2012-12-03 MED ORDER — ZIPRASIDONE HCL 80 MG PO CAPS
80.0000 mg | ORAL_CAPSULE | Freq: Every day | ORAL | Status: DC
Start: 2012-12-03 — End: 2012-12-04
  Administered 2012-12-03: 80 mg via ORAL
  Filled 2012-12-03 (×2): qty 1

## 2012-12-03 NOTE — Progress Notes (Signed)
ANTICOAGULATION CONSULT NOTE - Initial Consult  Pharmacy Consult for Lovenox (enoxaparin) Indication: pulmonary embolus  Allergies  Allergen Reactions  . Darvocet [Propoxyphene-Acetaminophen] Anaphylaxis    Can take plain tylenol  . Fluoxetine Other (See Comments)    Hallucinations   . Penicillins     REACTION: rash  . Promethazine Hives    Patient Measurements: Height: 5\' 2"  (157.5 cm) Weight: 320 lb 1.6 oz (145.196 kg) IBW/kg (Calculated) : 50.1   Vital Signs: Temp: 98 F (36.7 C) (09/05 1707) Temp src: Oral (09/05 1707) BP: 155/77 mmHg (09/05 1723) Pulse Rate: 75 (09/05 1723)  Labs:  Recent Labs  12/03/12 1419  HGB 11.3*  HCT 35.2*  PLT 207  CREATININE 0.97    Estimated Creatinine Clearance: 85.8 ml/min (by C-G formula based on Cr of 0.97).   Medical History: Past Medical History  Diagnosis Date  . Obesity   . Hyperlipemia   . Arthritis   . Diabetes mellitus type II   . Hypertension   . Heart murmur   . Mental disorder     Bipolar  . Anxiety   . Shortness of breath   . Sleep apnea     uses CPAP  . COPD (chronic obstructive pulmonary disease)     Medications:  Prescriptions prior to admission  Medication Sig Dispense Refill  . albuterol (PROVENTIL HFA;VENTOLIN HFA) 108 (90 BASE) MCG/ACT inhaler Inhale 2 puffs into the lungs every 6 (six) hours as needed. Inhale 2 puffs every 6 hrs prn.  Inhale 2 puffs every 4-6 hrs spaced 60 sec apart.      Marland Kitchen aspirin 81 MG chewable tablet Chew 81 mg by mouth daily.      Marland Kitchen atorvastatin (LIPITOR) 20 MG tablet Take 20 mg by mouth daily.      . clonazePAM (KLONOPIN) 1 MG tablet Take 1 at bed time  30 tablet  1  . fluticasone (FLONASE) 50 MCG/ACT nasal spray Place 2 sprays into the nose daily.      . Insulin Detemir (LEVEMIR FLEXPEN) 100 UNIT/ML SOPN Inject 52 Units into the skin at bedtime.      Marland Kitchen levothyroxine (SYNTHROID) 150 MCG tablet Take 150 mcg by mouth daily.      Marland Kitchen lisinopril (PRINIVIL,ZESTRIL) 5 MG tablet  Take 5 mg by mouth daily.      . metFORMIN (GLUCOPHAGE) 1000 MG tablet Take 1,000 mg by mouth 2 (two) times daily with a meal.      . PARoxetine (PAXIL) 40 MG tablet TAKE 1 TABLET BY MOUTH EVERY MORNING  90 tablet  0  . pioglitazone (ACTOS) 45 MG tablet Take 45 mg by mouth daily.      . traZODone (DESYREL) 100 MG tablet TAKE 2 TABLETS BY MOUTH EVERY NIGHT AT BEDTIME  180 tablet  0  . ziprasidone (GEODON) 80 MG capsule TAKE 1 CAPSULE BY MOUTH EVERY NIGHT AT BEDTIME  90 capsule  0   Scheduled:  . aspirin  81 mg Oral Daily  . atorvastatin  20 mg Oral Daily  . enoxaparin (LOVENOX) injection  1 mg/kg Subcutaneous Q12H  . fluticasone  2 spray Each Nare Daily  . [START ON 12/04/2012] insulin aspart  0-15 Units Subcutaneous TID WC  . [START ON 12/04/2012] insulin aspart  6 Units Subcutaneous TID WC  . insulin detemir  40 Units Subcutaneous QHS  . [START ON 12/04/2012] levothyroxine  150 mcg Oral QAC breakfast  . lisinopril  5 mg Oral Daily  . PARoxetine  40 mg Oral  Daily  . sodium chloride  3 mL Intravenous Q12H  . sodium chloride  3 mL Intravenous Q12H  . traZODone  200 mg Oral QHS  . ziprasidone  80 mg Oral QHS    Assessment: 62 yo female admitted with chief complaint of chest pain x 2 weeks.  CT angiogram of chest suspicious for pulmonary embolus in left upper lobe pulmonary artery.  Weight 145 kg.  SCr = 0.97,  CrCl ~ 86 ml/min Hgb 11.3,  PLTC 207K,  LFTs within normal limits.  Lovenox 145 mg SQ given tonight at 19:32.   Goal of Therapy:  Anti-Xa level 0.6-1.2 units/ml 4hrs after LMWH dose given Monitor platelets by anticoagulation protocol: Yes   Plan:  Continue lovenox as ordered 1mg /kg = 145 mg SQ q12 hours.  Monitor CBC q72h.  Monitor renal function and for symptoms of bleeding.   Noah Delaine, RPh Clinical Pharmacist Pager: [phone] 12/03/2012,8:16 PM

## 2012-12-03 NOTE — ED Notes (Addendum)
Cp and sob x 2 weeks  Went to dr today and was sent for further tests hurts to breath deep
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
## 2012-12-03 NOTE — ED Notes (Signed)
Katie RN attemped IV x2 and this RN x1 with no success.

## 2012-12-03 NOTE — H&P (Signed)
History and Physical Examination   Chelsea [name]:[id number] DOB: 1950/12/07 DOA: 12/03/2012  Referring physician: Gwendolyn Grant PCP: Dr. Maryelizabeth Rowan  Chief Complaint: chest pain   HPI: Chelsea [name] is a 62 y.o. female with type 2 diabetes mellitus, obesity, metabolic syndrome, hypertension, and dyslipidemia who presented to her primary care physician complaining of 2 weeks of atypical chest pressure and pain.  She reports that she's been experiencing episodes of chest discomfort involving the left side of the chest associated with taking a deep breath.  She reports she's having tenderness with taking a deep breath and also tenderness on the left side of the chest wall.  She reports that this has persisted over the past 2 weeks.  She reports that no radiation of pain into the jaw or into the back.  The patient reports that she was seen by her PCP and she was concerned and sent her to the emergency department for further evaluation to rule out PE and ACS.  The patient was seen in the emergency department and a CT angiography chest was ordered.  Her troponin and BNP came back within normal limits.  Hospital admission was requested for further evaluation and to rule out myocardial injury serial cardiac enzymes.  The patient reports that she has no history of known coronary artery disease.  She does have sleep apnea and uses CPAP.  She also has a history of COPD.  Past Medical History Past Medical History  Diagnosis Date  . Obesity   . Hyperlipemia   . Arthritis   . Diabetes mellitus type II   . Hypertension   . Heart murmur   . Mental disorder     Bipolar  . Anxiety   . Shortness of breath   . Sleep apnea     uses CPAP  . COPD (chronic obstructive pulmonary disease)     Past Surgical History Past Surgical History  Procedure Laterality Date  . Cholecystectomy    . Eye surgery    . Partial mastectomy with needle localization Left 05/10/2012    Procedure: PARTIAL MASTECTOMY WITH NEEDLE  LOCALIZATION;  Surgeon: Ernestene Mention, MD;  Location: [facility] OR;  Service: General;  Laterality: Left;    Home Meds: Prior to Admission medications   Medication Sig Start Date End Date Taking? Authorizing Provider  albuterol (PROVENTIL HFA;VENTOLIN HFA) 108 (90 BASE) MCG/ACT inhaler Inhale 2 puffs into the lungs every 6 (six) hours as needed. Inhale 2 puffs every 6 hrs prn.  Inhale 2 puffs every 4-6 hrs spaced 60 sec apart.   Yes Historical Provider, MD  aspirin 81 MG chewable tablet Chew 81 mg by mouth daily.   Yes Historical Provider, MD  atorvastatin (LIPITOR) 20 MG tablet Take 20 mg by mouth daily.   Yes Historical Provider, MD  clonazePAM (KLONOPIN) 1 MG tablet Take 1 at bed time 12/03/12  Yes Cleotis Nipper, MD  fluticasone (FLONASE) 50 MCG/ACT nasal spray Place 2 sprays into the nose daily.   Yes Historical Provider, MD  Insulin Detemir (LEVEMIR FLEXPEN) 100 UNIT/ML SOPN Inject 52 Units into the skin at bedtime.   Yes Historical Provider, MD  levothyroxine (SYNTHROID) 150 MCG tablet Take 150 mcg by mouth daily.   Yes Maryelizabeth Rowan, MD  lisinopril (PRINIVIL,ZESTRIL) 5 MG tablet Take 5 mg by mouth daily.   Yes Maryelizabeth Rowan, MD  metFORMIN (GLUCOPHAGE) 1000 MG tablet Take 1,000 mg by mouth 2 (two) times daily with a meal.   Yes Historical Provider, MD  PARoxetine (PAXIL) 40 MG tablet TAKE 1 TABLET BY MOUTH EVERY MORNING 11/05/12  Yes Cleotis Nipper, MD  pioglitazone (ACTOS) 45 MG tablet Take 45 mg by mouth daily.   Yes Maryelizabeth Rowan, MD  traZODone (DESYREL) 100 MG tablet TAKE 2 TABLETS BY MOUTH EVERY NIGHT AT BEDTIME 11/05/12  Yes Cleotis Nipper, MD  ziprasidone (GEODON) 80 MG capsule TAKE 1 CAPSULE BY MOUTH EVERY NIGHT AT BEDTIME 11/05/12  Yes Cleotis Nipper, MD    Allergies: Darvocet; Fluoxetine; Penicillins; and Promethazine  Social History:  History   Social History  . Marital Status: Single    Spouse Name: N/A    Number of Children: N/A  . Years of Education: N/A   Occupational  History  . Not on file.   Social History Main Topics  . Smoking status: Former Smoker -- 1.50 packs/day for 20 years    Types: Cigarettes    Quit date: 05/04/1983  . Smokeless tobacco: Never Used  . Alcohol Use: No  . Drug Use: No  . Sexual Activity: Not on file   Other Topics Concern  . Not on file   Social History Narrative  . No narrative on file   Family History:  Family History  Problem Relation Age of Onset  . Alcohol abuse Father     Review of Systems: The patient denies anorexia, fever, weight loss,, vision loss, decreased hearing, hoarseness,  syncope, peripheral edema, balance deficits, hemoptysis, abdominal pain, melena, hematochezia, severe indigestion/heartburn, hematuria, incontinence, genital sores, muscle weakness, suspicious skin lesions, transient blindness, difficulty walking, depression, unusual weight change, abnormal bleeding, enlarged lymph nodes, angioedema, and breast masses.   Also see history of present illness.  All other systems reviewed and reported as negative.   Physical Exam: Blood pressure 155/77, pulse 75, temperature 98 F (36.7 C), temperature source Oral, resp. rate 20, height 5\' 2"  (1.575 m), weight 145.196 kg (320 lb 1.6 oz), SpO2 95.00%. Constitutional: Overweight female.  She is oriented to person, place, and time. She appears well-developed and well-nourished. No distress.  HENT: Head: Normocephalic and atraumatic.  Mucous membranes dry. Eyes: EOM are normal. Pupils are equal, round, and reactive to light.  Neck: Normal range of motion. Neck supple.  Cardiovascular: Normal rate and regular rhythm.  Distant heart sounds. Pulmonary/Chest: Effort normal and breath sounds normal. No respiratory distress. She has no wheezes. She has no rales.  Abdominal: Soft. She exhibits no distension. There is no tenderness. There is no rebound.  Musculoskeletal: Normal range of motion.  2+ pitting edema bilateral lower extremities  Neurological: She is  alert and oriented to person, place, and time.  Skin: She is not diaphoretic.   Lab  And Imaging results:  Results for orders placed during the hospital encounter of 12/03/12 (from the past 24 hour(s))  COMPREHENSIVE METABOLIC PANEL     Status: Abnormal   Collection Time    12/03/12  2:19 PM      Result Value Range   Sodium 138  135 - 145 mEq/L   Potassium 4.2  3.5 - 5.1 mEq/L   Chloride 100  96 - 112 mEq/L   CO2 26  19 - 32 mEq/L   Glucose, Bld 206 (*) 70 - 99 mg/dL   BUN 21  6 - 23 mg/dL   Creatinine, Ser 1.61  0.50 - 1.10 mg/dL   Calcium 9.1  8.4 - 09.6 mg/dL   Total Protein 6.7  6.0 - 8.3 g/dL   Albumin 3.3 (*) 3.5 -  5.2 g/dL   AST 14  0 - 37 U/L   ALT 10  0 - 35 U/L   Alkaline Phosphatase 77  39 - 117 U/L   Total Bilirubin 0.3  0.3 - 1.2 mg/dL   GFR calc non Af Amer 62 (*) >90 mL/min   GFR calc Af Amer 72 (*) >90 mL/min  CBC WITH DIFFERENTIAL     Status: Abnormal   Collection Time    12/03/12  2:19 PM      Result Value Range   WBC 6.1  4.0 - 10.5 K/uL   RBC 3.91  3.87 - 5.11 MIL/uL   Hemoglobin 11.3 (*) 12.0 - 15.0 g/dL   HCT 40.9 (*) 81.1 - 91.4 %   MCV 90.0  78.0 - 100.0 fL   MCH 28.9  26.0 - 34.0 pg   MCHC 32.1  30.0 - 36.0 g/dL   RDW 78.2  95.6 - 21.3 %   Platelets 207  150 - 400 K/uL   Neutrophils Relative % 58  43 - 77 %   Neutro Abs 3.5  1.7 - 7.7 K/uL   Lymphocytes Relative 28  12 - 46 %   Lymphs Abs 1.7  0.7 - 4.0 K/uL   Monocytes Relative 7  3 - 12 %   Monocytes Absolute 0.5  0.1 - 1.0 K/uL   Eosinophils Relative 5  0 - 5 %   Eosinophils Absolute 0.3  0.0 - 0.7 K/uL   Basophils Relative 1  0 - 1 %   Basophils Absolute 0.0  0.0 - 0.1 K/uL  PRO B NATRIURETIC PEPTIDE     Status: Abnormal   Collection Time    12/03/12  2:20 PM      Result Value Range   Pro B Natriuretic peptide (BNP) 146.5 (*) 0 - 125 pg/mL  POCT I-STAT TROPONIN I     Status: None   Collection Time    12/03/12  3:06 PM      Result Value Range   Troponin i, poc 0.02  0.00 - 0.08  ng/mL   Comment 3           URINALYSIS, ROUTINE W REFLEX MICROSCOPIC     Status: Abnormal   Collection Time    12/03/12  3:31 PM      Result Value Range   Color, Urine YELLOW  YELLOW   APPearance CLOUDY (*) CLEAR   Specific Gravity, Urine 1.017  1.005 - 1.030   pH 5.5  5.0 - 8.0   Glucose, UA NEGATIVE  NEGATIVE mg/dL   Hgb urine dipstick NEGATIVE  NEGATIVE   Bilirubin Urine NEGATIVE  NEGATIVE   Ketones, ur NEGATIVE  NEGATIVE mg/dL   Protein, ur NEGATIVE  NEGATIVE mg/dL   Urobilinogen, UA 1.0  0.0 - 1.0 mg/dL   Nitrite NEGATIVE  NEGATIVE   Leukocytes, UA SMALL (*) NEGATIVE  URINE MICROSCOPIC-ADD ON     Status: Abnormal   Collection Time    12/03/12  3:31 PM      Result Value Range   Squamous Epithelial / LPF RARE  RARE   WBC, UA 3-6  <3 WBC/hpf   RBC / HPF 0-2  <3 RBC/hpf   Bacteria, UA FEW (*) RARE   Impression   Chest pain, atypical   HYPERTENSION, UNSPECIFIED   DYSPNEA   Type II or unspecified type diabetes mellitus without mention of complication, not stated as uncontrolled   Dyspnea  Plan  I recommend proceeding with CT angiogram  to rule out pulmonary embolus. Admit for observation and cycle cardiac enzymes to rule out myocardial ischemia. Monitor blood glucose. Check fasting lipid panel. Check Dopplers of lower extremities to rule out blood clot Continue CPAP DVT prophylaxis and start full anticoagulation for a positive DVT or PE result  Standley Dakins MD [facility] [place], [place] [phone] 12/03/2012, 5:42 PM

## 2012-12-03 NOTE — ED Provider Notes (Signed)
CSN: [id number]     Arrival date & time 12/03/12  1333 History   First MD Initiated Contact with Patient 12/03/12 1345     Chief Complaint  Patient presents with  . Chest Pain  . Shortness of Breath   (Consider location/radiation/quality/duration/timing/severity/associated sxs/prior Treatment) Patient is a 62 y.o. female presenting with chest pain and shortness of breath. The history is provided by the patient.  Chest Pain Pain location:  L chest Pain quality: sharp   Pain radiates to:  Upper back Pain radiates to the back: yes   Pain severity:  Mild Onset quality:  Gradual Duration:  2 weeks Timing:  Intermittent Progression:  Unchanged Chronicity:  New Context: breathing   Context comment:  Spontaneously Relieved by:  Nothing Worsened by:  Deep breathing and exertion Ineffective treatments:  None tried Associated symptoms: cough and shortness of breath   Associated symptoms: no abdominal pain, no fever, no nausea and not vomiting   Shortness of Breath Associated symptoms: chest pain and cough   Associated symptoms: no abdominal pain, no fever and no vomiting     Past Medical History  Diagnosis Date  . Obesity   . Hyperlipemia   . Arthritis   . Diabetes mellitus type II   . Hypertension   . Heart murmur   . Mental disorder     Bipolar  . Anxiety   . Shortness of breath   . Sleep apnea     uses CPAP  . COPD (chronic obstructive pulmonary disease)    Past Surgical History  Procedure Laterality Date  . Cholecystectomy    . Eye surgery    . Partial mastectomy with needle localization Left 05/10/2012    Procedure: PARTIAL MASTECTOMY WITH NEEDLE LOCALIZATION;  Surgeon: Ernestene Mention, MD;  Location: [facility] OR;  Service: General;  Laterality: Left;   Family History  Problem Relation Age of Onset  . Alcohol abuse Father    History  Substance Use Topics  . Smoking status: Former Smoker -- 1.50 packs/day for 20 years    Types: Cigarettes    Quit date: 05/04/1983  .  Smokeless tobacco: Never Used  . Alcohol Use: No   OB History   Grav Para Term Preterm Abortions TAB SAB Ect Mult Living                 Review of Systems  Constitutional: Negative for fever.  Respiratory: Positive for cough and shortness of breath.   Cardiovascular: Positive for chest pain and leg swelling.  Gastrointestinal: Negative for nausea, vomiting and abdominal pain.  All other systems reviewed and are negative.    Allergies  Darvocet; Fluoxetine; Penicillins; and Promethazine  Home Medications   Current Outpatient Rx  Name  Route  Sig  Dispense  Refill  . albuterol (PROVENTIL HFA;VENTOLIN HFA) 108 (90 BASE) MCG/ACT inhaler   Inhalation   Inhale 2 puffs into the lungs every 6 (six) hours as needed. Inhale 2 puffs every 6 hrs prn.  Inhale 2 puffs every 4-6 hrs spaced 60 sec apart.         Marland Kitchen aspirin 81 MG chewable tablet   Oral   Chew 81 mg by mouth daily.         Marland Kitchen atorvastatin (LIPITOR) 20 MG tablet   Oral   Take 20 mg by mouth daily.         . clonazePAM (KLONOPIN) 1 MG tablet      Take 1 at bed time   30  tablet   1   . fluticasone (FLONASE) 50 MCG/ACT nasal spray   Nasal   Place 2 sprays into the nose daily.         . Insulin Detemir (LEVEMIR FLEXPEN) 100 UNIT/ML SOPN   Subcutaneous   Inject 52 Units into the skin at bedtime.         Marland Kitchen levothyroxine (SYNTHROID) 150 MCG tablet   Oral   Take 150 mcg by mouth daily.         Marland Kitchen lisinopril (PRINIVIL,ZESTRIL) 5 MG tablet   Oral   Take 5 mg by mouth daily.         . metFORMIN (GLUCOPHAGE) 1000 MG tablet   Oral   Take 1,000 mg by mouth 2 (two) times daily with a meal.         . PARoxetine (PAXIL) 40 MG tablet      TAKE 1 TABLET BY MOUTH EVERY MORNING   90 tablet   0   . pioglitazone (ACTOS) 45 MG tablet   Oral   Take 45 mg by mouth daily.         . traZODone (DESYREL) 100 MG tablet      TAKE 2 TABLETS BY MOUTH EVERY NIGHT AT BEDTIME   180 tablet   0   . ziprasidone  (GEODON) 80 MG capsule      TAKE 1 CAPSULE BY MOUTH EVERY NIGHT AT BEDTIME   90 capsule   0    BP 125/32  Pulse 73  Resp 20  SpO2 95% Physical Exam  Nursing note and vitals reviewed. Constitutional: She is oriented to person, place, and time. She appears well-developed and well-nourished. No distress.  HENT:  Head: Normocephalic and atraumatic.  Eyes: EOM are normal. Pupils are equal, round, and reactive to light.  Neck: Normal range of motion. Neck supple.  Cardiovascular: Normal rate and regular rhythm.  Exam reveals no friction rub.   No murmur heard. Pulmonary/Chest: Effort normal and breath sounds normal. No respiratory distress. She has no wheezes. She has no rales.  Abdominal: Soft. She exhibits no distension. There is no tenderness. There is no rebound.  Musculoskeletal: Normal range of motion. She exhibits no edema.  Neurological: She is alert and oriented to person, place, and time.  Skin: No rash noted. She is not diaphoretic.    ED Course  Procedures (including critical care time) Labs Review Labs Reviewed  COMPREHENSIVE METABOLIC PANEL  CBC WITH DIFFERENTIAL  PRO B NATRIURETIC PEPTIDE   Imaging Review No results found.   Date: 12/03/2012  Rate: 68  Rhythm: normal sinus rhythm  QRS Axis: normal  Intervals: normal  ST/T Wave abnormalities: normal  Conduction Disutrbances:none  Narrative Interpretation:   Old EKG Reviewed: none available  Angiocath insertion Performed by: Dagmar Hait  Consent: Verbal consent obtained. Risks and benefits: risks, benefits and alternatives were discussed Time out: Immediately prior to procedure a "time out" was called to verify the correct patient, procedure, equipment, support staff and site/side marked as required.  Preparation: Patient was prepped and draped in the usual sterile fashion.  Vein Location: R AC  Yes Ultrasound Guided  Gauge: 20  Normal blood return and flush without difficulty Patient  tolerance: Patient tolerated the procedure well with no immediate complications.    MDM  No diagnosis found. 3F presents with exertional dyspnea and intermittent chest pain. No prior similar episodes. Occasional cough, no fevers. Denies other symptoms. Sent from PCP for concerns of cardiac vs thromboembolic  etiology. AFVSS here. Lungs clear, mild peripheral non-pitting edema. Will send labs, plan for PE scan.  Initial labs normal. Will admit for ACS rule-out.    Dagmar Hait, MD 12/03/12 [phone]

## 2012-12-04 DIAGNOSIS — R079 Chest pain, unspecified: Secondary | ICD-10-CM | POA: Diagnosis not present

## 2012-12-04 DIAGNOSIS — R0609 Other forms of dyspnea: Secondary | ICD-10-CM | POA: Diagnosis not present

## 2012-12-04 DIAGNOSIS — R0602 Shortness of breath: Secondary | ICD-10-CM

## 2012-12-04 DIAGNOSIS — R609 Edema, unspecified: Secondary | ICD-10-CM | POA: Diagnosis not present

## 2012-12-04 LAB — COMPREHENSIVE METABOLIC PANEL
ALT: 8 U/L (ref 0–35)
AST: 10 U/L (ref 0–37)
CO2: 27 mEq/L (ref 19–32)
Calcium: 8.9 mg/dL (ref 8.4–10.5)
Chloride: 105 mEq/L (ref 96–112)
GFR calc Af Amer: 75 mL/min — ABNORMAL LOW (ref >90)
GFR calc non Af Amer: 65 mL/min — ABNORMAL LOW (ref >90)
Glucose, Bld: 125 mg/dL — ABNORMAL HIGH (ref 70–99)
Sodium: 140 mEq/L (ref 135–145)
Total Bilirubin: 0.2 mg/dL — ABNORMAL LOW (ref 0.3–1.2)

## 2012-12-04 LAB — TSH: TSH: 1.509 u[IU]/mL (ref 0.350–4.500)

## 2012-12-04 LAB — CBC
HCT: 30.7 % — ABNORMAL LOW (ref 36.0–46.0)
Hemoglobin: 10.1 g/dL — ABNORMAL LOW (ref 12.0–15.0)
MCHC: 32.9 g/dL (ref 30.0–36.0)
MCV: 89.2 fL (ref 78.0–100.0)
RDW: 14.7 % (ref 11.5–15.5)
WBC: 5.3 10*3/uL (ref 4.0–10.5)

## 2012-12-04 LAB — GLUCOSE, CAPILLARY
Glucose-Capillary: 75 mg/dL (ref 70–99)
Glucose-Capillary: 103 mg/dL — ABNORMAL HIGH (ref 70–99)

## 2012-12-04 LAB — PROTIME-INR: INR: 1.03 (ref 0.00–1.49)

## 2012-12-04 MED ORDER — ACETAMINOPHEN 325 MG PO TABS: 650.0000 mg | ORAL_TABLET | Freq: Four times a day (QID) | ORAL | Status: DC | PRN

## 2012-12-04 MED ORDER — POTASSIUM CHLORIDE ER 10 MEQ PO TBCR: 10.0000 meq | EXTENDED_RELEASE_TABLET | Freq: Every day | ORAL | Status: DC

## 2012-12-04 MED ORDER — ENOXAPARIN SODIUM 150 MG/ML ~~LOC~~ SOLN
1.0000 mg/kg | Freq: Two times a day (BID) | SUBCUTANEOUS | Status: DC
  Filled 2012-12-04 (×2): qty 1

## 2012-12-04 MED ORDER — BUMETANIDE 1 MG PO TABS: 1.0000 mg | ORAL_TABLET | Freq: Every day | ORAL | Status: AC

## 2012-12-04 NOTE — Progress Notes (Signed)
Text page to Dr. Lendell Caprice to request clarification on Lovenox dose, per result of VQ scan, awaiting call back.

## 2012-12-04 NOTE — Progress Notes (Signed)
Patient placed on auto CPAP via full face mask. Tolerating well, RT will continue to monitor.

## 2012-12-04 NOTE — Progress Notes (Signed)
VASCULAR LAB PRELIMINARY  PRELIMINARY  PRELIMINARY  PRELIMINARY  Bilateral lower extremity venous Dopplers completed.    Preliminary report:  There is no DVT or SVT noted in the bilateral lower extremities.  Chelsea [name], RVT 12/04/2012, 11:11 AM

## 2012-12-04 NOTE — Discharge Summary (Signed)
Physician Discharge Summary  Chelsea [name] ZOX:[id number] DOB: Sep 29, 1950 DOA: 12/03/2012  PCP: Hilaria Ota, MD  Admit date: 12/03/2012 Discharge date: 12/04/2012  Time spent: greater than 30 minutes  Recommendations for Outpatient Follow-up:  1. Consider outpatient stress test.  Monitor electrolytes and renal function on diuretic  Discharge Diagnoses:  Principal Problem:   Chest pain Active Problems:   HYPERTENSION, UNSPECIFIED   DYSPNEA   Type II or unspecified type diabetes mellitus without mention of complication, not stated as uncontrolled   Dyspnea   Discharge Condition: stable  Filed Weights   12/03/12 1707  Weight: 145.196 kg (320 lb 1.6 oz)    History of present illness:  Chelsea [name] is a 62 y.o. female with type 2 diabetes mellitus, obesity, metabolic syndrome, hypertension, and dyslipidemia who presented to her primary care physician complaining of 2 weeks of atypical chest pressure and pain. She reports that she's been experiencing episodes of chest discomfort involving the left side of the chest associated with taking a deep breath. She reports she's having tenderness with taking a deep breath and also tenderness on the left side of the chest wall. She reports that this has persisted over the past 2 weeks. She reports that no radiation of pain into the jaw or into the back. The patient reports that she was seen by her PCP and she was concerned and sent her to the emergency department for further evaluation to rule out PE and ACS. The patient was seen in the emergency department and a CT angiography chest was ordered. Her troponin and BNP came back within normal limits. Hospital admission was requested for further evaluation and to rule out myocardial injury serial cardiac enzymes. The patient reports that she has no history of known coronary artery disease. She does have sleep apnea and uses CPAP. She also has a history of COPD.   Hospital Course:  MI ruled out.  Pain  improved.  CT angiogram of chest, VQ scan and doppler legs negative.  Family concerned about fluid retention.  Patient had been on bumex in the past.  This will be resumed. Previously followed by [facility].  Appointment will be arranged as outpatient to consider stress test.  Discharge Exam: Filed Vitals:   12/04/12 0058  BP:   Pulse: 95  Temp:   Resp:     General: obese.  Comfortable Cardiovascular: RRR without MGR Respiratory: CTA without WRR Ext: 2 plus edema  Discharge Instructions  Discharge Orders   Future Orders Complete By Expires   Activity as tolerated - No restrictions  As directed    Diet - low sodium heart healthy  As directed    Discharge instructions  As directed    Comments:     1500 cc fluid restriction. Low salt diet       Medication List         acetaminophen 325 MG tablet  Commonly known as:  TYLENOL  Take 2 tablets (650 mg total) by mouth every 6 (six) hours as needed for pain.     albuterol 108 (90 BASE) MCG/ACT inhaler  Commonly known as:  PROVENTIL HFA;VENTOLIN HFA  Inhale 2 puffs into the lungs every 6 (six) hours as needed. Inhale 2 puffs every 6 hrs prn.  Inhale 2 puffs every 4-6 hrs spaced 60 sec apart.     aspirin 81 MG chewable tablet  Chew 81 mg by mouth daily.     atorvastatin 20 MG tablet  Commonly known as:  LIPITOR  Take 20 mg by mouth daily.     bumetanide 1 MG tablet  Commonly known as:  BUMEX  Take 1 tablet (1 mg total) by mouth daily.     clonazePAM 1 MG tablet  Commonly known as:  KLONOPIN  Take 1 at bed time     fluticasone 50 MCG/ACT nasal spray  Commonly known as:  FLONASE  Place 2 sprays into the nose daily.     LEVEMIR FLEXPEN 100 UNIT/ML Sopn  Generic drug:  Insulin Detemir  Inject 52 Units into the skin at bedtime.     lisinopril 5 MG tablet  Commonly known as:  PRINIVIL,ZESTRIL  Take 5 mg by mouth daily.     metFORMIN 1000 MG tablet  Commonly known as:  GLUCOPHAGE  Take 1,000 mg by mouth 2  (two) times daily with a meal.     PARoxetine 40 MG tablet  Commonly known as:  PAXIL  TAKE 1 TABLET BY MOUTH EVERY MORNING     pioglitazone 45 MG tablet  Commonly known as:  ACTOS  Take 45 mg by mouth daily.     potassium chloride 10 MEQ tablet  Commonly known as:  K-DUR  Take 1 tablet (10 mEq total) by mouth daily.     SYNTHROID 150 MCG tablet  Generic drug:  levothyroxine  Take 150 mcg by mouth daily.     traZODone 100 MG tablet  Commonly known as:  DESYREL  TAKE 2 TABLETS BY MOUTH EVERY NIGHT AT BEDTIME     ziprasidone 80 MG capsule  Commonly known as:  GEODON  TAKE 1 CAPSULE BY MOUTH EVERY NIGHT AT BEDTIME       Allergies  Allergen Reactions  . Darvocet [Propoxyphene-Acetaminophen] Anaphylaxis    Can take plain tylenol  . Fluoxetine Other (See Comments)    Hallucinations   . Penicillins     REACTION: rash  . Promethazine Hives       Follow-up Information   Schedule an appointment as soon as possible for a visit with [facility]. (for stress test and monitor fluid retention)    Contact information:   [address] [place] [address]       Follow up with [facility] (to monitor potassium and kidney function)    Specialty:  Family Medicine       The results of significant diagnostics from this hospitalization (including imaging, microbiology, ancillary and laboratory) are listed below for reference.    Significant Diagnostic Studies: Dg Chest 2 View  12/03/2012   *RADIOLOGY REPORT*  Clinical Data: Chest pain and short of breath  CHEST - 2 VIEW  Comparison: 05/03/2012  Findings: Heart size upper normal.  Negative for heart failure. Lungs are free of infiltrate effusion or mass.  IMPRESSION: No active cardiopulmonary abnormality.   Original Report Authenticated By: Janeece Riggers, M.D.   Ct Angio Chest Pe W/cm &/or Wo Cm  12/03/2012   CLINICAL DATA:  Shortness of breath. Chest pain. Diabetes. COPD. Cardiac murmur.  EXAM: CT  ANGIOGRAPHY CHEST WITH CONTRAST  TECHNIQUE: Multidetector CT imaging of the chest was performed using the standard protocol during bolus administration of intravenous contrast. Multiplanar CT image reconstructions including MIPs were obtained to evaluate the vascular anatomy.  CONTRAST:  OMNIPAQUE IOHEXOL 350 MG/ML SOLN  COMPARISON:  Multiple exams, including 12/03/2012 and 06/02/2012  FINDINGS: Reduced sensitivity and specificity due to patient body habitus and timing issues. The pulmonary arterial bolus is not very concentrated. Motion artifact also contributes to  blurring and reduced density in the pulmonary arterial tree in some locations.  Low-density in a left upper lobe pulmonary arterial branches shown on image 93 of series 5, and could represent pulmonary embolus. Given how patchy the density in the pulmonary arteries as, this is somewhat uncertain.  Scattered small prevascular lymph nodes are again observed. Right upper paratracheal node short axis 1.1 cm, previously 1.3 cm. Left infrahilar lymph node 1.2 cm in short axis, previously 1.3 cm.  Emphysema noted. There is scarring anteriorly in the right upper lobe just above the minor fissure. Small at eventration of the left hemidiaphragm. Previous density posteriorly in the right upper lobe has resolved. Previous density posteriorly in the left lower lobe has resolved.  Review of the MIP images confirms the above findings.  IMPRESSION: 1. Heterogeneity in a left upper lobe pulmonary artery suspicious for pulmonary embolus. Admittedly the dilute pulmonary arterial contrast column (similar to prior exam attempts) and patient body habitus significantly adversely affect positive and negative predictive value of this exam. This may be a case where ventilation perfusion lung scan and lower extremity Doppler are helpful in providing correlating imaging. 2. Mild chronic mediastinal and left infrahilar adenopathy. 3. Emphysema. 4. Resolution of the prior  airspace opacities seen in 2011.   Electronically Signed   By: Herbie Baltimore   On: 12/03/2012 17:18   Nm Pulmonary Perf And Vent  12/03/2012   *RADIOLOGY REPORT*  Clinical Data:  Concern for pulmonary embolism.  Short of breath  NUCLEAR MEDICINE VENTILATION - PERFUSION LUNG SCAN  Technique:  Ventilation images were obtained in multiple projections using inhaled aerosol technetium 99 M DTPA.  Perfusion images were obtained in multiple projections after intravenous injection of Tc-53m MAA.  Radiopharmaceuticals:  Tc-22m DTPA aerosol and 40 mCi Tc-23m MAA.  Comparison: Chest radiograph 12/03/2012, CTA 95,014  Findings:  Ventilation:   No focal ventilation defect. There is some clumping of radiotracer in the upper lobes.  Perfusion:   No wedge shaped peripheral perfusion defects to suggest acute pulmonary embolism are in particular there is no wedge shaped defect within the left upper lobe correspond to the CT abnormality.  IMPRESSION: Very low probability for acute pulmonary embolism.   Original Report Authenticated By: Genevive Bi, M.D.    Microbiology: No results found for this or any previous visit (from the past 240 hour(s)).   Labs: Basic Metabolic Panel:  Recent Labs Lab 12/03/12 1419 12/04/12 0512  NA 138 140  K 4.2 3.7  CL 100 105  CO2 26 27  GLUCOSE 206* 125*  BUN 21 18  CREATININE 0.97 0.94  CALCIUM 9.1 8.9   Liver Function Tests:  Recent Labs Lab 12/03/12 1419 12/04/12 0512  AST 14 10  ALT 10 8  ALKPHOS 77 67  BILITOT 0.3 0.2*  PROT 6.7 5.9*  ALBUMIN 3.3* 2.9*   No results found for this basename: LIPASE, AMYLASE,  in the last 168 hours No results found for this basename: AMMONIA,  in the last 168 hours CBC:  Recent Labs Lab 12/03/12 1419 12/04/12 0512  WBC 6.1 5.3  NEUTROABS 3.5  --   HGB 11.3* 10.1*  HCT 35.2* 30.7*  MCV 90.0 89.2  PLT 207 189   Cardiac Enzymes:  Recent Labs Lab 12/03/12 1925 12/03/12 2347 12/04/12 0512  TROPONINI <0.30  <0.30 <0.30   BNP: BNP (last 3 results)  Recent Labs  12/03/12 1420  PROBNP 146.5*   CBG:  Recent Labs Lab 12/03/12 2146 12/04/12 0758  GLUCAP 173* 75    Signed:  Greco Gastelum L  [facility] 12/04/2012, 12:26 PM

## 2012-12-04 NOTE — Progress Notes (Signed)
Patient discharge Home per Md order.  Discharge instructions reviewed with patient and family.  Copies of all forms given and explained. Patient/family voiced understanding of all instructions.  Discharge in no acute distress. Lahoma Constantin B. Jenisis Harmsen, RN BC, BSN, MSN 

## 2012-12-04 NOTE — Progress Notes (Signed)
Utilization Review completed.  

## 2012-12-04 NOTE — Evaluation (Signed)
Physical Therapy Evaluation Patient Details Name: Chelsea [name] MRN: [id number] DOB: 03-May-1950 Today's Date: 12/04/2012 Time: 0865-7846 PT Time Calculation (min): 23 min  PT Assessment / Plan / Recommendation History of Present Illness  Chelsea [name] is a 62 y.o. female with type 2 diabetes mellitus, obesity, metabolic syndrome, hypertension, and dyslipidemia who presented to her primary care physician complaining of 2 weeks of atypical chest pressure and pain.  She reports that she's been experiencing episodes of chest discomfort involving the left side of the chest associated with taking a deep breath.  She reports she's having tenderness with taking a deep breath and also tenderness on the left side of the chest wall.  She reports that this has persisted over the past 2 weeks.  She reports that no radiation of pain into the jaw or into the back.  The patient reports that she was seen by her PCP and she was concerned and sent her to the emergency department for further evaluation to rule out PE and ACS.  The patient was seen in the emergency department and a CT angiography chest was ordered.  Her troponin and BNP came back within normal limits.  Hospital admission was requested for further evaluation and to rule out myocardial injury serial cardiac enzymes.  The patient reports that she has no history of known coronary artery disease.  She does have sleep apnea and uses CPAP.  She also has a history of COPD.  Pt treated for PE.   Clinical Impression  Pt moving well and at baseline with overall function.  Therefore no further PT needs at this time.  PT will sign off.     PT Assessment  Patent does not need any further PT services    Follow Up Recommendations  No PT follow up    Equipment Recommendations  None recommended by PT    Precautions / Restrictions Precautions Precautions: None Restrictions Weight Bearing Restrictions: No   Pertinent Vitals/Pain No c/o pain      Mobility   Bed Mobility Bed Mobility: Not assessed Transfers Transfers: Sit to Stand;Stand to Sit Sit to Stand: 6: Modified independent (Device/Increase time);From chair/3-in-1 Stand to Sit: 6: Modified independent (Device/Increase time);To chair/3-in-1 Ambulation/Gait Ambulation/Gait Assistance: 6: Modified independent (Device/Increase time) Ambulation Distance (Feet): 100 Feet Assistive device: None Stairs: No (no stairs at home)    Exercises General Exercises - Lower Extremity Ankle Circles/Pumps: AROM;Strengthening;Both;10 reps Long Arc Quad: Strengthening;10 reps;Both Hip Flexion/Marching: Strengthening;Both;10 reps   PT Diagnosis:    PT Problem List:   PT Treatment Interventions:       PT Goals(Current goals can be found in the care plan section) Acute Rehab PT Goals Patient Stated Goal: To return home with sister PT Goal Formulation: No goals set, d/c therapy  Visit Information  Last PT Received On: 12/04/12 Assistance Needed: +1 History of Present Illness: Chelsea [name] is a 62 y.o. female with type 2 diabetes mellitus, obesity, metabolic syndrome, hypertension, and dyslipidemia who presented to her primary care physician complaining of 2 weeks of atypical chest pressure and pain.  She reports that she's been experiencing episodes of chest discomfort involving the left side of the chest associated with taking a deep breath.  She reports she's having tenderness with taking a deep breath and also tenderness on the left side of the chest wall.  She reports that this has persisted over the past 2 weeks.  She reports that no radiation of pain into the jaw or into the back.  The patient reports that she was seen by her PCP and she was concerned and sent her to the emergency department for further evaluation to rule out PE and ACS.  The patient was seen in the emergency department and a CT angiography chest was ordered.  Her troponin and BNP came back within normal limits.  Hospital admission  was requested for further evaluation and to rule out myocardial injury serial cardiac enzymes.  The patient reports that she has no history of known coronary artery disease.  She does have sleep apnea and uses CPAP.  She also has a history of COPD.  Pt treated for PE.        Prior Functioning  Home Living Family/patient expects to be discharged to:: Private residence Living Arrangements: Other relatives (sister) Available Help at Discharge: Family Type of Home: Apartment Home Access: Level entry Home Layout: One level Home Equipment: Environmental consultant - 2 wheels;Cane - single point Prior Function Level of Independence: Independent Communication Communication: No difficulties Dominant Hand: Right    Cognition  Cognition Arousal/Alertness: Awake/alert Behavior During Therapy: WFL for tasks assessed/performed Overall Cognitive Status: Within Functional Limits for tasks assessed    Extremity/Trunk Assessment Lower Extremity Assessment Lower Extremity Assessment: Overall WFL for tasks assessed   Balance    End of Session PT - End of Session Activity Tolerance: Patient tolerated treatment well Patient left: in chair;with call bell/phone within reach Nurse Communication: Mobility status  GP     Thaison Kolodziejski 12/04/2012, 9:25 AM  Jake Shark, PT DPT [phone]

## 2012-12-06 DIAGNOSIS — R0789 Other chest pain: Secondary | ICD-10-CM | POA: Diagnosis not present

## 2012-12-06 DIAGNOSIS — E782 Mixed hyperlipidemia: Secondary | ICD-10-CM | POA: Diagnosis not present

## 2012-12-06 DIAGNOSIS — R0602 Shortness of breath: Secondary | ICD-10-CM | POA: Diagnosis not present

## 2012-12-06 DIAGNOSIS — E119 Type 2 diabetes mellitus without complications: Secondary | ICD-10-CM | POA: Diagnosis not present

## 2012-12-07 LAB — URINE CULTURE: Colony Count: >=100000

## 2012-12-10 ENCOUNTER — Ambulatory Visit (HOSPITAL_COMMUNITY): Payer: Self-pay | Admitting: Psychiatry

## 2012-12-15 ENCOUNTER — Other Ambulatory Visit (HOSPITAL_COMMUNITY): Payer: Self-pay | Admitting: Psychiatry

## 2012-12-17 ENCOUNTER — Ambulatory Visit
Admission: RE | Admit: 2012-12-17 | Discharge: 2012-12-17 | Disposition: A | Discharge status: Home / Self Care | Payer: Medicare Other | Source: Ambulatory Visit | Attending: Family Medicine | Admitting: Family Medicine

## 2012-12-17 ENCOUNTER — Ambulatory Visit
Admission: RE | Admit: 2012-12-17 | Discharge: 2012-12-17 | Disposition: A | Discharge status: Home / Self Care | Payer: Medicaid Other | Source: Ambulatory Visit | Attending: Family Medicine | Admitting: Family Medicine

## 2012-12-17 ENCOUNTER — Other Ambulatory Visit: Payer: Self-pay | Admitting: Family Medicine

## 2012-12-17 DIAGNOSIS — R52 Pain, unspecified: Secondary | ICD-10-CM

## 2012-12-17 DIAGNOSIS — S59909A Unspecified injury of unspecified elbow, initial encounter: Secondary | ICD-10-CM | POA: Diagnosis not present

## 2012-12-17 DIAGNOSIS — R609 Edema, unspecified: Secondary | ICD-10-CM

## 2012-12-17 DIAGNOSIS — M25529 Pain in unspecified elbow: Secondary | ICD-10-CM | POA: Diagnosis not present

## 2012-12-17 DIAGNOSIS — S5290XA Unspecified fracture of unspecified forearm, initial encounter for closed fracture: Secondary | ICD-10-CM | POA: Diagnosis not present

## 2012-12-17 DIAGNOSIS — S52209A Unspecified fracture of shaft of unspecified ulna, initial encounter for closed fracture: Secondary | ICD-10-CM | POA: Diagnosis not present

## 2012-12-17 NOTE — Telephone Encounter (Signed)
12/17/12 Refill request for Klonopin not appropriate as Dr Lolly Mustache had called in prescription on 12/03/12 with 1 refill. Called pharmacy to verify and they did show a refill on file and stated they would remove the refill request for their data base. Cooper Render, RN

## 2012-12-20 ENCOUNTER — Encounter: Payer: Self-pay | Admitting: Cardiology

## 2012-12-20 DIAGNOSIS — R0602 Shortness of breath: Secondary | ICD-10-CM | POA: Diagnosis not present

## 2012-12-20 DIAGNOSIS — I509 Heart failure, unspecified: Secondary | ICD-10-CM | POA: Diagnosis not present

## 2012-12-24 DIAGNOSIS — S62109A Fracture of unspecified carpal bone, unspecified wrist, initial encounter for closed fracture: Secondary | ICD-10-CM | POA: Diagnosis not present

## 2012-12-24 DIAGNOSIS — I509 Heart failure, unspecified: Secondary | ICD-10-CM | POA: Diagnosis not present

## 2012-12-24 DIAGNOSIS — B372 Candidiasis of skin and nail: Secondary | ICD-10-CM | POA: Diagnosis not present

## 2012-12-24 DIAGNOSIS — S52599A Other fractures of lower end of unspecified radius, initial encounter for closed fracture: Secondary | ICD-10-CM | POA: Diagnosis not present

## 2012-12-24 DIAGNOSIS — I10 Essential (primary) hypertension: Secondary | ICD-10-CM | POA: Diagnosis not present

## 2012-12-31 DIAGNOSIS — S62109A Fracture of unspecified carpal bone, unspecified wrist, initial encounter for closed fracture: Secondary | ICD-10-CM | POA: Diagnosis not present

## 2013-01-06 ENCOUNTER — Other Ambulatory Visit (HOSPITAL_COMMUNITY): Payer: Self-pay | Admitting: Psychiatry

## 2013-01-07 ENCOUNTER — Other Ambulatory Visit (HOSPITAL_COMMUNITY): Payer: Self-pay | Admitting: Cardiology

## 2013-01-07 DIAGNOSIS — I10 Essential (primary) hypertension: Secondary | ICD-10-CM

## 2013-01-07 DIAGNOSIS — E119 Type 2 diabetes mellitus without complications: Secondary | ICD-10-CM | POA: Diagnosis not present

## 2013-01-07 DIAGNOSIS — R0602 Shortness of breath: Secondary | ICD-10-CM

## 2013-01-07 DIAGNOSIS — E662 Morbid (severe) obesity with alveolar hypoventilation: Secondary | ICD-10-CM | POA: Diagnosis not present

## 2013-01-12 ENCOUNTER — Other Ambulatory Visit (HOSPITAL_COMMUNITY): Payer: Self-pay | Admitting: Psychiatry

## 2013-01-14 ENCOUNTER — Other Ambulatory Visit (HOSPITAL_COMMUNITY): Payer: Self-pay | Admitting: Psychiatry

## 2013-01-14 DIAGNOSIS — S62109A Fracture of unspecified carpal bone, unspecified wrist, initial encounter for closed fracture: Secondary | ICD-10-CM | POA: Diagnosis not present

## 2013-01-16 NOTE — Progress Notes (Signed)
G-Codes for PT evaluation performed on 12/07/2012   January 19, 2013 0700  PT G-Codes **NOT FOR INPATIENT CLASS**  Functional Limitation Mobility: Walking and moving around  Mobility: Walking and Moving Around Current Status [id number]) [facility]  Mobility: Walking and Moving Around Discharge Status [id number]) [facility]    [place], [place] DPT [phone]

## 2013-01-21 DIAGNOSIS — E119 Type 2 diabetes mellitus without complications: Secondary | ICD-10-CM | POA: Diagnosis not present

## 2013-01-21 DIAGNOSIS — E662 Morbid (severe) obesity with alveolar hypoventilation: Secondary | ICD-10-CM | POA: Diagnosis not present

## 2013-01-21 DIAGNOSIS — I10 Essential (primary) hypertension: Secondary | ICD-10-CM | POA: Diagnosis not present

## 2013-01-25 ENCOUNTER — Encounter (HOSPITAL_COMMUNITY)
Admission: RE | Admit: 2013-01-25 | Discharge: 2013-01-25 | Disposition: A | Discharge status: Home / Self Care | Payer: Medicare Other | Source: Ambulatory Visit | Attending: Cardiology | Admitting: Cardiology

## 2013-01-25 DIAGNOSIS — I2510 Atherosclerotic heart disease of native coronary artery without angina pectoris: Secondary | ICD-10-CM | POA: Insufficient documentation

## 2013-01-25 DIAGNOSIS — I10 Essential (primary) hypertension: Secondary | ICD-10-CM

## 2013-01-25 DIAGNOSIS — R079 Chest pain, unspecified: Secondary | ICD-10-CM | POA: Insufficient documentation

## 2013-01-25 DIAGNOSIS — R0602 Shortness of breath: Secondary | ICD-10-CM

## 2013-01-25 MED ORDER — REGADENOSON 0.4 MG/5ML IV SOLN
INTRAVENOUS | Status: AC
  Filled 2013-01-25: qty 5

## 2013-01-25 MED ORDER — REGADENOSON 0.4 MG/5ML IV SOLN
0.4000 mg | Freq: Once | INTRAVENOUS | Status: AC
  Administered 2013-01-25: 0.4 mg via INTRAVENOUS

## 2013-01-26 ENCOUNTER — Encounter (HOSPITAL_COMMUNITY)
Admission: RE | Admit: 2013-01-26 | Discharge: 2013-01-26 | Disposition: A | Discharge status: Home / Self Care | Payer: Medicare Other | Source: Ambulatory Visit | Attending: Cardiology | Admitting: Cardiology

## 2013-01-26 DIAGNOSIS — I2510 Atherosclerotic heart disease of native coronary artery without angina pectoris: Secondary | ICD-10-CM | POA: Diagnosis not present

## 2013-01-26 MED ORDER — TECHNETIUM TC 99M SESTAMIBI GENERIC - CARDIOLITE
30.0000 | Freq: Once | INTRAVENOUS | Status: AC | PRN
  Administered 2013-01-26: 30 via INTRAVENOUS

## 2013-02-01 DIAGNOSIS — S62109A Fracture of unspecified carpal bone, unspecified wrist, initial encounter for closed fracture: Secondary | ICD-10-CM | POA: Diagnosis not present

## 2013-02-03 ENCOUNTER — Other Ambulatory Visit (HOSPITAL_COMMUNITY): Payer: Self-pay | Admitting: Psychiatry

## 2013-02-03 DIAGNOSIS — F200 Paranoid schizophrenia: Secondary | ICD-10-CM

## 2013-02-04 ENCOUNTER — Encounter (HOSPITAL_COMMUNITY): Payer: Self-pay | Admitting: Psychiatry

## 2013-02-04 ENCOUNTER — Ambulatory Visit (INDEPENDENT_AMBULATORY_CARE_PROVIDER_SITE_OTHER): Payer: Medicare Other | Admitting: Psychiatry

## 2013-02-04 VITALS — Wt 295.0 lb

## 2013-02-04 DIAGNOSIS — F200 Paranoid schizophrenia: Secondary | ICD-10-CM | POA: Diagnosis not present

## 2013-02-04 MED ORDER — TRAZODONE HCL 100 MG PO TABS: ORAL_TABLET | ORAL | Status: DC
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
## 2013-02-11 ENCOUNTER — Other Ambulatory Visit: Payer: Self-pay | Admitting: Family Medicine

## 2013-02-11 DIAGNOSIS — Z9012 Acquired absence of left breast and nipple: Secondary | ICD-10-CM

## 2013-02-11 DIAGNOSIS — Z1231 Encounter for screening mammogram for malignant neoplasm of breast: Secondary | ICD-10-CM

## 2013-02-17 DIAGNOSIS — S62109A Fracture of unspecified carpal bone, unspecified wrist, initial encounter for closed fracture: Secondary | ICD-10-CM | POA: Diagnosis not present

## 2013-02-18 DIAGNOSIS — E119 Type 2 diabetes mellitus without complications: Secondary | ICD-10-CM | POA: Diagnosis not present

## 2013-02-18 DIAGNOSIS — R0602 Shortness of breath: Secondary | ICD-10-CM | POA: Diagnosis not present

## 2013-02-18 DIAGNOSIS — E662 Morbid (severe) obesity with alveolar hypoventilation: Secondary | ICD-10-CM | POA: Diagnosis not present

## 2013-03-09 ENCOUNTER — Other Ambulatory Visit (HOSPITAL_COMMUNITY): Payer: Self-pay | Admitting: Psychiatry

## 2013-03-10 NOTE — Telephone Encounter (Signed)
Refill request not appropriate at this time, has 1 month supply left on file with pharmacy. Called pharmacy to verify. Refill denied.

## 2013-04-01 ENCOUNTER — Other Ambulatory Visit (HOSPITAL_COMMUNITY): Payer: Self-pay | Admitting: Psychiatry

## 2013-04-01 DIAGNOSIS — F200 Paranoid schizophrenia: Secondary | ICD-10-CM

## 2013-04-04 NOTE — Telephone Encounter (Signed)
Attempted to call prescription for Klonopin however pharmacy is closed after 5pm with no option to leave voicemail for refills

## 2013-04-14 ENCOUNTER — Telehealth (HOSPITAL_COMMUNITY): Payer: Self-pay | Admitting: *Deleted

## 2013-04-14 NOTE — Telephone Encounter (Signed)
Changed patient's pharmacy in chart to [facility]

## 2013-04-22 ENCOUNTER — Ambulatory Visit
Admission: RE | Admit: 2013-04-22 | Discharge: 2013-04-22 | Disposition: A | Discharge status: Home / Self Care | Payer: Medicare Other | Source: Ambulatory Visit | Attending: Family Medicine | Admitting: Family Medicine

## 2013-04-22 DIAGNOSIS — I10 Essential (primary) hypertension: Secondary | ICD-10-CM | POA: Diagnosis not present

## 2013-04-22 DIAGNOSIS — Z9012 Acquired absence of left breast and nipple: Secondary | ICD-10-CM

## 2013-04-22 DIAGNOSIS — E662 Morbid (severe) obesity with alveolar hypoventilation: Secondary | ICD-10-CM | POA: Diagnosis not present

## 2013-04-22 DIAGNOSIS — Z1231 Encounter for screening mammogram for malignant neoplasm of breast: Secondary | ICD-10-CM

## 2013-04-22 DIAGNOSIS — E119 Type 2 diabetes mellitus without complications: Secondary | ICD-10-CM | POA: Diagnosis not present

## 2013-04-22 DIAGNOSIS — I509 Heart failure, unspecified: Secondary | ICD-10-CM | POA: Diagnosis not present

## 2013-04-26 ENCOUNTER — Other Ambulatory Visit: Payer: Self-pay | Admitting: Family Medicine

## 2013-04-26 DIAGNOSIS — R928 Other abnormal and inconclusive findings on diagnostic imaging of breast: Secondary | ICD-10-CM

## 2013-04-28 ENCOUNTER — Telehealth (HOSPITAL_COMMUNITY): Payer: Self-pay

## 2013-05-02 ENCOUNTER — Other Ambulatory Visit (HOSPITAL_COMMUNITY): Payer: Self-pay | Admitting: *Deleted

## 2013-05-02 NOTE — Telephone Encounter (Signed)
Pharmacy information entered in chart

## 2013-05-03 ENCOUNTER — Telehealth (HOSPITAL_COMMUNITY): Payer: Self-pay | Admitting: *Deleted

## 2013-05-03 NOTE — Telephone Encounter (Signed)
[facility] called requesting refill of Clonazepam.  Informed Levada Dy in pharmacy that RX phoned in 04/05/13 had a refill on it. She states pt had RX transferred to [facility] for first fill and refill cannot be transferred back to [facility].  Currently has refill on RX at [facility]

## 2013-05-06 ENCOUNTER — Ambulatory Visit
Admission: RE | Admit: 2013-05-06 | Discharge: 2013-05-06 | Disposition: A | Discharge status: Home / Self Care | Payer: Medicare Other | Source: Ambulatory Visit | Attending: Family Medicine | Admitting: Family Medicine

## 2013-05-06 DIAGNOSIS — R928 Other abnormal and inconclusive findings on diagnostic imaging of breast: Secondary | ICD-10-CM

## 2013-05-06 DIAGNOSIS — N6489 Other specified disorders of breast: Secondary | ICD-10-CM | POA: Diagnosis not present

## 2013-05-13 ENCOUNTER — Encounter (HOSPITAL_COMMUNITY): Payer: Self-pay | Admitting: Psychiatry

## 2013-05-13 ENCOUNTER — Ambulatory Visit (INDEPENDENT_AMBULATORY_CARE_PROVIDER_SITE_OTHER): Payer: Medicare Other | Admitting: Psychiatry

## 2013-05-13 DIAGNOSIS — F200 Paranoid schizophrenia: Secondary | ICD-10-CM | POA: Diagnosis not present

## 2013-05-13 MED ORDER — ZIPRASIDONE HCL 80 MG PO CAPS: ORAL_CAPSULE | ORAL | Status: DC

## 2013-05-13 MED ORDER — PAROXETINE HCL 40 MG PO TABS: ORAL_TABLET | ORAL | Status: DC

## 2013-05-13 MED ORDER — TRAZODONE HCL 100 MG PO TABS: ORAL_TABLET | ORAL | Status: DC
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
## 2013-05-30 ENCOUNTER — Telehealth (HOSPITAL_COMMUNITY): Payer: Self-pay | Admitting: *Deleted

## 2013-05-30 NOTE — Telephone Encounter (Signed)
Error

## 2013-06-03 ENCOUNTER — Other Ambulatory Visit (HOSPITAL_COMMUNITY): Payer: Self-pay | Admitting: Psychiatry

## 2013-06-03 DIAGNOSIS — F200 Paranoid schizophrenia: Secondary | ICD-10-CM

## 2013-06-10 ENCOUNTER — Telehealth (HOSPITAL_COMMUNITY): Payer: Self-pay

## 2013-06-24 ENCOUNTER — Other Ambulatory Visit (HOSPITAL_COMMUNITY): Payer: Self-pay | Admitting: Psychiatry

## 2013-06-24 DIAGNOSIS — F200 Paranoid schizophrenia: Secondary | ICD-10-CM

## 2013-06-28 NOTE — Telephone Encounter (Signed)
[place] to inform that all three of these RX were escribed to [facility] on 05/13/13 as 39 day RX. Kristeen Miss at [facility] stated patient never received RX sent in on 05/13/13 as Patient is no longer enrolled with them and is using a local pharmacy.

## 2013-06-29 NOTE — Progress Notes (Signed)
Late entry for missed G-code based on chart review of Chelsea [name], PT evaluation.  12-05-12 1300  PT G-Codes **NOT FOR INPATIENT CLASS**  Functional Limitation Mobility: Walking and moving around  Mobility: Walking and Moving Around Current Status [phone]) [facility]  Mobility: Walking and Moving Around Goal Status [phone]) [facility]  Mobility: Walking and Moving Around Discharge Status [phone]) [place], [place]  [phone] 06/29/2013

## 2013-07-22 DIAGNOSIS — E662 Morbid (severe) obesity with alveolar hypoventilation: Secondary | ICD-10-CM | POA: Diagnosis not present

## 2013-07-22 DIAGNOSIS — I10 Essential (primary) hypertension: Secondary | ICD-10-CM | POA: Diagnosis not present

## 2013-07-22 DIAGNOSIS — E782 Mixed hyperlipidemia: Secondary | ICD-10-CM | POA: Diagnosis not present

## 2013-07-22 DIAGNOSIS — E119 Type 2 diabetes mellitus without complications: Secondary | ICD-10-CM | POA: Diagnosis not present

## 2013-08-03 ENCOUNTER — Other Ambulatory Visit (HOSPITAL_COMMUNITY): Payer: Self-pay | Admitting: *Deleted

## 2013-08-03 DIAGNOSIS — F200 Paranoid schizophrenia: Secondary | ICD-10-CM

## 2013-08-03 MED ORDER — CLONAZEPAM 1 MG PO TABS: ORAL_TABLET | ORAL | Status: DC

## 2013-08-12 ENCOUNTER — Ambulatory Visit (INDEPENDENT_AMBULATORY_CARE_PROVIDER_SITE_OTHER): Payer: Medicare Other | Admitting: Psychiatry

## 2013-08-12 ENCOUNTER — Encounter (HOSPITAL_COMMUNITY): Payer: Self-pay | Admitting: Psychiatry

## 2013-08-12 VITALS — Wt 281.0 lb

## 2013-08-12 DIAGNOSIS — F200 Paranoid schizophrenia: Secondary | ICD-10-CM | POA: Diagnosis not present

## 2013-08-12 MED ORDER — TRAZODONE HCL 100 MG PO TABS: ORAL_TABLET | ORAL | Status: DC

## 2013-08-12 MED ORDER — PAROXETINE HCL 40 MG PO TABS: ORAL_TABLET | ORAL | Status: DC

## 2013-08-12 MED ORDER — ZIPRASIDONE HCL 80 MG PO CAPS: ORAL_CAPSULE | ORAL | Status: DC

## 2013-08-12 NOTE — Progress Notes (Signed)
[place] Progress Note  Chelsea [name] [id number] 62 y.o.  08/12/2013 11:32 AM  Chief Complaint: Medication management and followup     History of Present Illness: Chelsea [name] came for her followup appointment.  She is doing better on her medication.  She had lost more weight from her last visit.  She denies any side effects of medication.  She is watching her calorie intake and she is happy that she is losing the weight. She denies any agitation, anger, hallucination or any paranoia.  She sleeping good.  She denies any changes in her sleep .  She is compliant with her medication.  She is not drinking or using any illegal substances.  She lives with her sister who is very supportive.  Patient has no children.  Suicidal Ideation: No Plan Formed: No Patient has means to carry out plan: No  Homicidal Ideation: No Plan Formed: No Patient has means to carry out plan: No  Review of Systems  Musculoskeletal: Positive for back pain.  Psychiatric/Behavioral: Negative for suicidal ideas and substance abuse. The patient is nervous/anxious.     Psychiatric: Agitation: No Hallucination: No  Depressed Mood: No Insomnia: No Hypersomnia: No Altered Concentration: No Feels Worthless: No Grandiose Ideas: No Belief In Special Powers: No New/Increased Substance Abuse: No Compulsions: No  Neurologic: Headache: Yes Seizure: No Paresthesias: No  Medical history. Hypertension, morbid obesity, hyperlipidemia, sleep apnea and arthritis.  Her primary care physician is Dr. Jalene Mullet.    Outpatient Encounter Prescriptions as of 08/12/2013  Medication Sig  . clonazePAM (KLONOPIN) 1 MG tablet TAKE ONE TABLET AT BEDTIME.  . metFORMIN (GLUCOPHAGE) 1000 MG tablet Take 1,000 mg by mouth 2 (two) times daily with a meal.  . PARoxetine (PAXIL) 40 MG tablet TAKE 1 TABLET ONCE DAILY.  . traZODone (DESYREL) 100 MG tablet TAKE 2 TABLETS AT BEDTIME.  . ziprasidone (GEODON) 80 MG capsule  TAKE 1 CAPSULE AT BEDTIME.  . [DISCONTINUED] PARoxetine (PAXIL) 40 MG tablet TAKE 1 TABLET ONCE DAILY.  . [DISCONTINUED] traZODone (DESYREL) 100 MG tablet TAKE 2 TABLETS AT BEDTIME.  . [DISCONTINUED] ziprasidone (GEODON) 80 MG capsule TAKE 1 CAPSULE AT BEDTIME.  Marland Kitchen acetaminophen (TYLENOL) 325 MG tablet Take 2 tablets (650 mg total) by mouth every 6 (six) hours as needed for pain.  Marland Kitchen albuterol (PROVENTIL HFA;VENTOLIN HFA) 108 (90 BASE) MCG/ACT inhaler Inhale 2 puffs into the lungs every 6 (six) hours as needed. Inhale 2 puffs every 6 hrs prn.  Inhale 2 puffs every 4-6 hrs spaced 60 sec apart.  Marland Kitchen aspirin 81 MG chewable tablet Chew 81 mg by mouth daily.  Marland Kitchen atorvastatin (LIPITOR) 20 MG tablet Take 20 mg by mouth daily.  . bumetanide (BUMEX) 1 MG tablet Take 1 tablet (1 mg total) by mouth daily.  . fluticasone (FLONASE) 50 MCG/ACT nasal spray Place 2 sprays into the nose daily.  . Insulin Detemir (LEVEMIR FLEXPEN) 100 UNIT/ML SOPN Inject 52 Units into the skin at bedtime.  Marland Kitchen levothyroxine (SYNTHROID) 150 MCG tablet Take 150 mcg by mouth daily.  Marland Kitchen lisinopril (PRINIVIL,ZESTRIL) 5 MG tablet Take 5 mg by mouth daily.  . pioglitazone (ACTOS) 45 MG tablet Take 45 mg by mouth daily.  . potassium chloride (K-DUR) 10 MEQ tablet Take 1 tablet (10 mEq total) by mouth daily.    Past Psychiatric History/Hospitalization(s): Patient has long history of psychiatric illness.  She was [facility] at [facility] and [facility].  There is no history of suicidal attempt however she has  history of mania and psychosis.  She admitted talking to the picture and herself in the past.  She do not remember what medicine she had tried in the past.   Anxiety: Yes Bipolar Disorder: No Depression: Yes Mania: No Psychosis: Yes Schizophrenia: Yes Personality Disorder: No Hospitalization for psychiatric illness: Yes History of Electroconvulsive Shock Therapy: No Prior Suicide Attempts: No  Physical Exam: Constitutional:   Wt 281 lb (127.461 kg)  General Appearance: obese and Maintained fair eye contact.  She is casually dressed and fairly groomed.  Musculoskeletal: Strength & Muscle Tone: within normal limits Gait & Station: normal, slow to walk Patient leans: N/A  Psychiatric: Speech (describe rate, volume, coherence, spontaneity, and abnormalities if any): Soft clear and coherent with normal tone and volume.  Thought Process (describe rate, content, abstract reasoning, and computation): Slow but goal-directed  Associations: Relevant and Intact  Thoughts: normal  Mental Status: Orientation: oriented to person, place and time/date Mood & Affect: normal affect and anxiety Attention Span & Concentration: Fair  Established Problem, Stable/Improving (1), Review of Last Therapy Session (1) and Review of Medication Regimen & Side Effects (2)  Assessment: Axis I: Schizophrenia chronic paranoid type  Axis II: Deferred  Axis III: See medical history.  Axis IV: Moderate  Axis V: 55-65   Plan:  Patient is stable on her current psychotropic medication.  She does not have any side effects.  I will continue Klonopin 1 mg at bedtime, Geodon 80 mg at bedtime, trazodone 200 mg at bedtime, Paxil 40 mg at bedtime.  Discussed risks and benefits of medication.  Recommend to call us back otherwise I will see her in 3 months.  Chelsea Hlavac T., MD 08/12/2013

## 2013-08-31 ENCOUNTER — Other Ambulatory Visit (HOSPITAL_COMMUNITY): Payer: Self-pay | Admitting: *Deleted

## 2013-08-31 DIAGNOSIS — F200 Paranoid schizophrenia: Secondary | ICD-10-CM

## 2013-08-31 MED ORDER — CLONAZEPAM 1 MG PO TABS: ORAL_TABLET | ORAL | Status: DC

## 2013-09-16 DIAGNOSIS — E039 Hypothyroidism, unspecified: Secondary | ICD-10-CM | POA: Diagnosis not present

## 2013-09-16 DIAGNOSIS — N390 Urinary tract infection, site not specified: Secondary | ICD-10-CM | POA: Diagnosis not present

## 2013-09-19 ENCOUNTER — Other Ambulatory Visit: Payer: Self-pay

## 2013-09-19 ENCOUNTER — Other Ambulatory Visit: Payer: Self-pay | Admitting: Family Medicine

## 2013-09-19 ENCOUNTER — Ambulatory Visit
Admission: RE | Admit: 2013-09-19 | Discharge: 2013-09-19 | Disposition: A | Discharge status: Home / Self Care | Payer: Medicare Other | Source: Ambulatory Visit | Attending: Family Medicine | Admitting: Family Medicine

## 2013-09-19 DIAGNOSIS — N10 Acute tubulo-interstitial nephritis: Secondary | ICD-10-CM | POA: Diagnosis not present

## 2013-09-19 DIAGNOSIS — R109 Unspecified abdominal pain: Secondary | ICD-10-CM

## 2013-09-19 DIAGNOSIS — N390 Urinary tract infection, site not specified: Secondary | ICD-10-CM | POA: Diagnosis not present

## 2013-09-26 ENCOUNTER — Other Ambulatory Visit (HOSPITAL_COMMUNITY): Payer: Self-pay | Admitting: Psychiatry

## 2013-09-26 DIAGNOSIS — F200 Paranoid schizophrenia: Secondary | ICD-10-CM

## 2013-10-07 DIAGNOSIS — K5900 Constipation, unspecified: Secondary | ICD-10-CM | POA: Diagnosis not present

## 2013-10-07 DIAGNOSIS — E039 Hypothyroidism, unspecified: Secondary | ICD-10-CM | POA: Diagnosis not present

## 2013-10-10 ENCOUNTER — Other Ambulatory Visit: Payer: Self-pay | Admitting: Family Medicine

## 2013-10-10 DIAGNOSIS — N6489 Other specified disorders of breast: Secondary | ICD-10-CM

## 2013-10-24 ENCOUNTER — Other Ambulatory Visit (HOSPITAL_COMMUNITY): Payer: Self-pay | Admitting: Psychiatry

## 2013-11-04 ENCOUNTER — Ambulatory Visit
Admission: RE | Admit: 2013-11-04 | Discharge: 2013-11-04 | Disposition: A | Discharge status: Home / Self Care | Payer: Medicare Other | Source: Ambulatory Visit | Attending: Family Medicine | Admitting: Family Medicine

## 2013-11-04 DIAGNOSIS — IMO0002 Reserved for concepts with insufficient information to code with codable children: Secondary | ICD-10-CM | POA: Diagnosis not present

## 2013-11-04 DIAGNOSIS — N6489 Other specified disorders of breast: Secondary | ICD-10-CM

## 2013-11-04 DIAGNOSIS — R928 Other abnormal and inconclusive findings on diagnostic imaging of breast: Secondary | ICD-10-CM | POA: Diagnosis not present

## 2013-11-11 ENCOUNTER — Ambulatory Visit (HOSPITAL_COMMUNITY): Payer: Self-pay | Admitting: Psychiatry

## 2013-11-18 ENCOUNTER — Ambulatory Visit (HOSPITAL_COMMUNITY): Payer: Self-pay | Admitting: Psychiatry

## 2013-11-22 ENCOUNTER — Other Ambulatory Visit (HOSPITAL_COMMUNITY): Payer: Self-pay | Admitting: Psychiatry

## 2013-12-14 DIAGNOSIS — N390 Urinary tract infection, site not specified: Secondary | ICD-10-CM | POA: Diagnosis not present

## 2013-12-20 DIAGNOSIS — E119 Type 2 diabetes mellitus without complications: Secondary | ICD-10-CM | POA: Diagnosis not present

## 2013-12-20 DIAGNOSIS — I10 Essential (primary) hypertension: Secondary | ICD-10-CM | POA: Diagnosis not present

## 2013-12-20 DIAGNOSIS — N390 Urinary tract infection, site not specified: Secondary | ICD-10-CM | POA: Diagnosis not present

## 2013-12-22 ENCOUNTER — Other Ambulatory Visit (HOSPITAL_COMMUNITY): Payer: Self-pay | Admitting: Psychiatry

## 2013-12-23 ENCOUNTER — Ambulatory Visit (HOSPITAL_COMMUNITY): Payer: Self-pay | Admitting: Psychiatry

## 2014-01-12 ENCOUNTER — Ambulatory Visit (HOSPITAL_COMMUNITY): Payer: Self-pay | Admitting: Psychiatry

## 2014-01-19 ENCOUNTER — Other Ambulatory Visit (HOSPITAL_COMMUNITY): Payer: Self-pay | Admitting: Psychiatry

## 2014-01-20 NOTE — Telephone Encounter (Signed)
Refill not appropriate, called pharmacy to clarify why refill needed. They state during call in on 9/24 that no refills remain. Explained that per order we approved 1 refill during call in. They state it must have been a mistake and not written down when it was called in. No refill needed since original order had 1 refill.

## 2014-01-31 DIAGNOSIS — J309 Allergic rhinitis, unspecified: Secondary | ICD-10-CM | POA: Diagnosis not present

## 2014-01-31 DIAGNOSIS — Z23 Encounter for immunization: Secondary | ICD-10-CM | POA: Diagnosis not present

## 2014-02-03 ENCOUNTER — Ambulatory Visit (INDEPENDENT_AMBULATORY_CARE_PROVIDER_SITE_OTHER): Payer: Medicare Other | Admitting: Psychiatry

## 2014-02-03 ENCOUNTER — Encounter (HOSPITAL_COMMUNITY): Payer: Self-pay | Admitting: Psychiatry

## 2014-02-03 VITALS — BP 114/69 | HR 71 | Ht 62.0 in | Wt 273.0 lb

## 2014-02-03 DIAGNOSIS — Z23 Encounter for immunization: Secondary | ICD-10-CM | POA: Diagnosis not present

## 2014-02-03 DIAGNOSIS — F200 Paranoid schizophrenia: Secondary | ICD-10-CM | POA: Diagnosis not present

## 2014-02-03 DIAGNOSIS — E782 Mixed hyperlipidemia: Secondary | ICD-10-CM | POA: Diagnosis not present

## 2014-02-03 DIAGNOSIS — I10 Essential (primary) hypertension: Secondary | ICD-10-CM | POA: Diagnosis not present

## 2014-02-03 DIAGNOSIS — E119 Type 2 diabetes mellitus without complications: Secondary | ICD-10-CM | POA: Diagnosis not present

## 2014-02-03 MED ORDER — ZIPRASIDONE HCL 80 MG PO CAPS: 80.0000 mg | ORAL_CAPSULE | Freq: Every day | ORAL | Status: DC

## 2014-02-03 MED ORDER — CLONAZEPAM 1 MG PO TABS: 1.0000 mg | ORAL_TABLET | Freq: Every day | ORAL | Status: DC

## 2014-02-03 MED ORDER — PAROXETINE HCL 40 MG PO TABS: 40.0000 mg | ORAL_TABLET | ORAL | Status: DC

## 2014-02-03 MED ORDER — TRAZODONE HCL 100 MG PO TABS: 100.0000 mg | ORAL_TABLET | Freq: Every day | ORAL | Status: DC

## 2014-02-03 NOTE — Progress Notes (Signed)
Chelsea [name] Progress Note  Chelsea [name] [id number] 63 y.o.  02/03/2014 11:01 AM  Chief Complaint: Medication management and followup     History of Present Illness: Chelsea [name] came for her followup appointment.  She is taking her medication as prescribed.  She is sleeping better.  She denies any irritability, paranoia, anger or any agitation.  Her hallucinations are less intense and less frequent from the past.  She is watching her diet and she is able to lose some weight from the past.  She is excited about Thanksgiving and she is planning to visit family members.  Her appetite is okay.  Her vitals are stable.  She is not drinking or using any illegal substances.  She lives with her sister who is very supportive.  Patient has no children.  Patient is scheduled to see her physician Dr. Rachell Cipro today for her blood work and will checkup.  Suicidal Ideation: No Plan Formed: No Patient has means to carry out plan: No  Homicidal Ideation: No Plan Formed: No Patient has means to carry out plan: No  Review of Systems  Musculoskeletal: Positive for back pain.  Psychiatric/Behavioral: Negative for suicidal ideas and substance abuse.    Psychiatric: Agitation: No Hallucination: No  Depressed Mood: No Insomnia: No Hypersomnia: No Altered Concentration: No Feels Worthless: No Grandiose Ideas: No Belief In Special Powers: No New/Increased Substance Abuse: No Compulsions: No  Neurologic: Headache: Yes Seizure: No Paresthesias: No  Medical history. Hypertension, morbid obesity, hyperlipidemia, sleep apnea and arthritis.  Her primary care physician is Dr. Jalene Mullet.    Outpatient Encounter Prescriptions as of 02/03/2014  Medication Sig  . acetaminophen (TYLENOL) 325 MG tablet Take 2 tablets (650 mg total) by mouth every 6 (six) hours as needed for pain.  Marland Kitchen albuterol (PROVENTIL HFA;VENTOLIN HFA) 108 (90 BASE) MCG/ACT inhaler Inhale 2 puffs into the lungs  every 6 (six) hours as needed. Inhale 2 puffs every 6 hrs prn.  Inhale 2 puffs every 4-6 hrs spaced 60 sec apart.  Marland Kitchen aspirin 81 MG chewable tablet Chew 81 mg by mouth daily.  Marland Kitchen atorvastatin (LIPITOR) 20 MG tablet Take 20 mg by mouth daily.  . bumetanide (BUMEX) 1 MG tablet Take 1 tablet (1 mg total) by mouth daily.  . clonazePAM (KLONOPIN) 1 MG tablet Take 1 tablet (1 mg total) by mouth at bedtime.  . fluticasone (FLONASE) 50 MCG/ACT nasal spray Place 2 sprays into the nose daily.  . Insulin Detemir (LEVEMIR FLEXPEN) 100 UNIT/ML SOPN Inject 52 Units into the skin at bedtime.  Marland Kitchen levothyroxine (SYNTHROID) 150 MCG tablet Take 150 mcg by mouth daily.  Marland Kitchen lisinopril (PRINIVIL,ZESTRIL) 5 MG tablet Take 5 mg by mouth daily.  . metFORMIN (GLUCOPHAGE) 1000 MG tablet Take 1,000 mg by mouth 2 (two) times daily with a meal.  . PARoxetine (PAXIL) 40 MG tablet Take 1 tablet (40 mg total) by mouth every morning.  . pioglitazone (ACTOS) 45 MG tablet Take 45 mg by mouth daily.  . potassium chloride (K-DUR) 10 MEQ tablet Take 1 tablet (10 mEq total) by mouth daily.  . traZODone (DESYREL) 100 MG tablet Take 1 tablet (100 mg total) by mouth at bedtime.  . ziprasidone (GEODON) 80 MG capsule Take 1 capsule (80 mg total) by mouth at bedtime.  . [DISCONTINUED] clonazePAM (KLONOPIN) 1 MG tablet TAKE 1 TABLET ONCE DAILY AT BEDTIME. MUST LAST 30 DAYS-NO EARLY REFILLS.  . [DISCONTINUED] PARoxetine (PAXIL) 40 MG tablet TAKE 1 TABLET ONCE DAILY.  . [  DISCONTINUED] traZODone (DESYREL) 100 MG tablet TAKE 2 TABLETS AT BEDTIME.  . [DISCONTINUED] ziprasidone (GEODON) 80 MG capsule TAKE 1 CAPSULE AT BEDTIME.    Past Psychiatric History/Hospitalization(s): Patient has long history of psychiatric illness.  She was admitted at [facility] and [facility].  There is no history of suicidal attempt however she has history of mania and psychosis.  She admitted talking to the picture and herself in the past.  She do not remember what  medicine she had tried in the past.   Anxiety: Yes Bipolar Disorder: No Depression: Yes Mania: No Psychosis: Yes Schizophrenia: Yes Personality Disorder: No Hospitalization for psychiatric illness: Yes History of Electroconvulsive Shock Therapy: No Prior Suicide Attempts: No  Physical Exam: Constitutional:  BP 114/69 mmHg  Pulse 71  Ht 5\' 2"  (1.575 m)  Wt 273 lb (123.832 kg)  BMI 49.92 kg/m2  General Appearance: obese and Maintained fair eye contact.  She is casually dressed and fairly groomed.  Musculoskeletal: Strength & Muscle Tone: within normal limits Gait & Station: normal, slow to walk Patient leans: N/A  Psychiatric: Speech (describe rate, volume, coherence, spontaneity, and abnormalities if any): Soft clear and coherent with normal tone and volume.  Thought Process (describe rate, content, abstract reasoning, and computation): Slow but goal-directed  Associations: Relevant and Intact  Thoughts: normal  Mental Status: Orientation: oriented to person, place and time/date Mood & Affect: normal affect and anxiety Attention Span & Concentration: Fair  Established Problem, Stable/Improving (1), Review of Last Therapy Session (1) and Review of Medication Regimen & Side Effects (2)  Assessment: Axis I: Schizophrenia chronic paranoid type  Axis II: Deferred  Axis III: See medical history.  Axis IV: Moderate  Axis V: 55-65   Plan:  Patient is stable on her current psychotropic medication.  I will continue her current medication.  She is taking Klonopin 1 mg at bedtime, Geodon 80 mg at bedtime, trazodone 200 mg at bedtime, Paxil 40 mg at bedtime.  Discussed risks and benefits of medication.  Recommended to have her blood work result faxed to [place].  Recommend to call us back otherwise I will see her in 3 months.  Marque Bango T., MD 02/03/2014

## 2014-02-06 ENCOUNTER — Telehealth (HOSPITAL_COMMUNITY): Payer: Self-pay | Admitting: *Deleted

## 2014-02-06 DIAGNOSIS — F200 Paranoid schizophrenia: Secondary | ICD-10-CM

## 2014-02-06 MED ORDER — TRAZODONE HCL 100 MG PO TABS: 200.0000 mg | ORAL_TABLET | Freq: Every day | ORAL | Status: DC

## 2014-02-06 NOTE — Telephone Encounter (Signed)
Received fax from pharmacy: Last RX for Trazodone was for 2 at bedtime.New RX is for #60, take ONE at bedtime. Is this a change in therapy? Gave question to Dr. Adele Schilder.Per Dr. Marguerite Olea order, was error in ordering. Please change RX

## 2014-03-01 ENCOUNTER — Ambulatory Visit
Admission: RE | Admit: 2014-03-01 | Discharge: 2014-03-01 | Disposition: A | Discharge status: Home / Self Care | Payer: Medicare Other | Source: Ambulatory Visit | Attending: Family Medicine | Admitting: Family Medicine

## 2014-03-01 ENCOUNTER — Other Ambulatory Visit: Payer: Self-pay | Admitting: Family Medicine

## 2014-03-01 DIAGNOSIS — R0989 Other specified symptoms and signs involving the circulatory and respiratory systems: Secondary | ICD-10-CM | POA: Diagnosis not present

## 2014-03-01 DIAGNOSIS — R059 Cough, unspecified: Secondary | ICD-10-CM

## 2014-03-01 DIAGNOSIS — R05 Cough: Secondary | ICD-10-CM | POA: Diagnosis not present

## 2014-03-01 DIAGNOSIS — I10 Essential (primary) hypertension: Secondary | ICD-10-CM | POA: Diagnosis not present

## 2014-03-01 DIAGNOSIS — J189 Pneumonia, unspecified organism: Secondary | ICD-10-CM | POA: Diagnosis not present

## 2014-03-01 DIAGNOSIS — R0602 Shortness of breath: Secondary | ICD-10-CM | POA: Diagnosis not present

## 2014-03-03 DIAGNOSIS — J189 Pneumonia, unspecified organism: Secondary | ICD-10-CM | POA: Diagnosis not present

## 2014-03-03 DIAGNOSIS — I10 Essential (primary) hypertension: Secondary | ICD-10-CM | POA: Diagnosis not present

## 2014-03-03 DIAGNOSIS — R05 Cough: Secondary | ICD-10-CM | POA: Diagnosis not present

## 2014-03-17 DIAGNOSIS — E119 Type 2 diabetes mellitus without complications: Secondary | ICD-10-CM | POA: Diagnosis not present

## 2014-03-17 DIAGNOSIS — G4733 Obstructive sleep apnea (adult) (pediatric): Secondary | ICD-10-CM | POA: Diagnosis not present

## 2014-03-17 DIAGNOSIS — E662 Morbid (severe) obesity with alveolar hypoventilation: Secondary | ICD-10-CM | POA: Diagnosis not present

## 2014-03-17 DIAGNOSIS — I10 Essential (primary) hypertension: Secondary | ICD-10-CM | POA: Diagnosis not present

## 2014-04-05 ENCOUNTER — Other Ambulatory Visit: Payer: Self-pay

## 2014-04-05 DIAGNOSIS — Z1231 Encounter for screening mammogram for malignant neoplasm of breast: Secondary | ICD-10-CM

## 2014-04-28 ENCOUNTER — Ambulatory Visit: Payer: Self-pay

## 2014-04-28 ENCOUNTER — Ambulatory Visit
Admission: RE | Admit: 2014-04-28 | Discharge: 2014-04-28 | Disposition: A | Discharge status: Home / Self Care | Payer: Medicare Other | Source: Ambulatory Visit

## 2014-04-28 DIAGNOSIS — Z1231 Encounter for screening mammogram for malignant neoplasm of breast: Secondary | ICD-10-CM | POA: Diagnosis not present

## 2014-05-10 DIAGNOSIS — E782 Mixed hyperlipidemia: Secondary | ICD-10-CM | POA: Diagnosis not present

## 2014-05-10 DIAGNOSIS — I10 Essential (primary) hypertension: Secondary | ICD-10-CM | POA: Diagnosis not present

## 2014-05-10 DIAGNOSIS — E119 Type 2 diabetes mellitus without complications: Secondary | ICD-10-CM | POA: Diagnosis not present

## 2014-05-10 DIAGNOSIS — E662 Morbid (severe) obesity with alveolar hypoventilation: Secondary | ICD-10-CM | POA: Diagnosis not present

## 2014-05-12 ENCOUNTER — Encounter (HOSPITAL_COMMUNITY): Payer: Self-pay | Admitting: Psychiatry

## 2014-05-12 ENCOUNTER — Telehealth (HOSPITAL_COMMUNITY): Payer: Self-pay | Admitting: *Deleted

## 2014-05-12 ENCOUNTER — Ambulatory Visit (INDEPENDENT_AMBULATORY_CARE_PROVIDER_SITE_OTHER): Payer: Medicare Other | Admitting: Psychiatry

## 2014-05-12 VITALS — BP 122/74 | HR 74 | Ht 62.0 in | Wt 270.6 lb

## 2014-05-12 DIAGNOSIS — F200 Paranoid schizophrenia: Secondary | ICD-10-CM | POA: Diagnosis not present

## 2014-05-12 MED ORDER — TRAZODONE HCL 100 MG PO TABS: 200.0000 mg | ORAL_TABLET | Freq: Every day | ORAL | Status: DC

## 2014-05-12 MED ORDER — PAROXETINE HCL 40 MG PO TABS: 40.0000 mg | ORAL_TABLET | ORAL | Status: DC

## 2014-05-12 MED ORDER — CLONAZEPAM 1 MG PO TABS: 1.0000 mg | ORAL_TABLET | Freq: Every day | ORAL | Status: DC

## 2014-05-12 MED ORDER — ZIPRASIDONE HCL 80 MG PO CAPS: 80.0000 mg | ORAL_CAPSULE | Freq: Every day | ORAL | Status: DC

## 2014-05-12 NOTE — Progress Notes (Signed)
[facility] [phone] Progress Note  Chelsea [name] [id number] 63 y.o.  05/12/2014 11:59 AM  Chief Complaint: Medication management and followup     History of Present Illness: Chelsea [name] came for her followup appointment.  She is compliant with her psychotropic medication.  She denies any irritability, anger or any hallucination.  Recently she visited her primary care physician Dr. Willette Pa and she was told her hemoglobin A1c is above 7.  Her insulin dose was increased and now she is taking 72 units.  Her blood pressure medicine is also change and now she is taking losartan .  Today her blood pressure is good.  Her appetite is okay and she is able to lost another 3 Bond from her last visit.  She mentioned that she had a good Christmas with her sister.  Patient denies any crying spells.  Her sleep is improved.  She has no tremors or shakes.  Recently she had a puppy and she has noticed spending a lot of time with the puppy .  Patient denies drinking or using any illegal substances.  Patient has no children.    Suicidal Ideation: No Plan Formed: No Patient has means to carry out plan: No  Homicidal Ideation: No Plan Formed: No Patient has means to carry out plan: No  Review of Systems  Constitutional: Positive for weight loss.  Musculoskeletal: Positive for back pain.  Skin: Negative for itching and rash.  Psychiatric/Behavioral: Negative for depression, suicidal ideas, hallucinations and substance abuse. The patient is not nervous/anxious and does not have insomnia.     Psychiatric: Agitation: No Hallucination: No  Depressed Mood: No Insomnia: No Hypersomnia: No Altered Concentration: No Feels Worthless: No Grandiose Ideas: No Belief In Special Powers: No New/Increased Substance Abuse: No Compulsions: No  Neurologic: Headache: Yes Seizure: No Paresthesias: No  Medical history. Hypertension, morbid obesity, hyperlipidemia, sleep apnea and arthritis.  Her primary care  physician is Dr. Jalene Mullet.    Outpatient Encounter Prescriptions as of 05/12/2014  Medication Sig  . albuterol (PROVENTIL HFA;VENTOLIN HFA) 108 (90 BASE) MCG/ACT inhaler Inhale 2 puffs into the lungs every 6 (six) hours as needed. Inhale 2 puffs every 6 hrs prn.  Inhale 2 puffs every 4-6 hrs spaced 60 sec apart.  Marland Kitchen aspirin 81 MG chewable tablet Chew 81 mg by mouth daily.  Marland Kitchen atorvastatin (LIPITOR) 20 MG tablet Take 20 mg by mouth daily.  . bumetanide (BUMEX) 1 MG tablet Take 1 tablet (1 mg total) by mouth daily.  . clonazePAM (KLONOPIN) 1 MG tablet Take 1 tablet (1 mg total) by mouth at bedtime.  . fluticasone (FLONASE) 50 MCG/ACT nasal spray Place 2 sprays into the nose daily.  . Insulin Detemir (LEVEMIR FLEXPEN) 100 UNIT/ML SOPN Inject 70 Units into the skin at bedtime.   Marland Kitchen levothyroxine (SYNTHROID) 150 MCG tablet Take 150 mcg by mouth daily.  Marland Kitchen losartan (COZAAR) 50 MG tablet Take 50 mg by mouth daily.  . metFORMIN (GLUCOPHAGE) 1000 MG tablet Take 1,000 mg by mouth 2 (two) times daily with a meal.  . PARoxetine (PAXIL) 40 MG tablet Take 1 tablet (40 mg total) by mouth every morning.  . potassium chloride (K-DUR) 10 MEQ tablet Take 1 tablet (10 mEq total) by mouth daily.  . traZODone (DESYREL) 100 MG tablet Take 2 tablets (200 mg total) by mouth at bedtime.  . ziprasidone (GEODON) 80 MG capsule Take 1 capsule (80 mg total) by mouth at bedtime.  . [DISCONTINUED] clonazePAM (KLONOPIN) 1 MG tablet Take  1 tablet (1 mg total) by mouth at bedtime.  . [DISCONTINUED] PARoxetine (PAXIL) 40 MG tablet Take 1 tablet (40 mg total) by mouth every morning.  . [DISCONTINUED] traZODone (DESYREL) 100 MG tablet Take 2 tablets (200 mg total) by mouth at bedtime.  . [DISCONTINUED] ziprasidone (GEODON) 80 MG capsule Take 1 capsule (80 mg total) by mouth at bedtime.  Marland Kitchen acetaminophen (TYLENOL) 325 MG tablet Take 2 tablets (650 mg total) by mouth every 6 (six) hours as needed for pain.  . pioglitazone (ACTOS)  45 MG tablet Take 45 mg by mouth daily.  . [DISCONTINUED] lisinopril (PRINIVIL,ZESTRIL) 5 MG tablet Take 5 mg by mouth daily.    Past Psychiatric History/Hospitalization(s): Patient has long history of psychiatric illness.  She was admitted at [facility] and [facility].  There is no history of suicidal attempt however she has history of mania and psychosis.  She admitted talking to the picture and herself in the past.  She do not remember what medicine she had tried in the past.   Anxiety: Yes Bipolar Disorder: No Depression: Yes Mania: No Psychosis: Yes Schizophrenia: Yes Personality Disorder: No Hospitalization for psychiatric illness: Yes History of Electroconvulsive Shock Therapy: No Prior Suicide Attempts: No  Physical Exam: Constitutional:  BP 122/74 mmHg  Pulse 74  Ht 5\' 2"  (1.575 m)  Wt 270 lb 9.6 oz (122.743 kg)  BMI 49.48 kg/m2  General Appearance: obese and Maintained fair eye contact.  She is casually dressed and fairly groomed.  Musculoskeletal: Strength & Muscle Tone: Back pain Gait & Station: normal, slow to walk Patient leans: N/A  Mental status examination: Patient is a elderly female who is obese and appears older than her stated age.  She is pleasant and cooperative.  She maintained fair eye contact.  Her speech is slow , soft but clear and coherent.  Her thought process is slow but logical.  She denies any auditory or visual hallucination.  Her attention and concentration is fair.  There were no delusions, paranoia or any obsessive thoughts.  She denies any active or passive suicidal thoughts or homicidal thought.  Her psychomotor activity is slow.  There were no tremors, shakes or any EPS.  Her fund of knowledge is fair.  Her cognition is grossly intact.  She's alert and oriented 3.  Her insight judgment and impulse control is okay.  Established Problem, Stable/Improving (1), Review of Psycho-Social Stressors (1), Review or order clinical lab tests (1),  Decision to obtain old records (1), Review and summation of old records (2), Review of Last Therapy Session (1) and Review of Medication Regimen & Side Effects (2)  Assessment: Axis I: Schizophrenia chronic paranoid type  Axis II: Deferred  Axis III: See medical history.   Plan:  We will get records from her primary care physician Dr. Willette Pa as patient seen her a few days ago.  She reported her hemoglobin A1c is about 7 which actually increased from her last hemoglobin A1c 6.8.  Recently her insulin doses adjusted and she is taking a new blood pressure medication.  She does not want to change her psychotropic medication.  She believe her depression and hallucination is under control.  I will continue Klonopin 1 mg at bedtime, Geodon 80 mg at bedtime, trazodone 200 mg at bedtime, Paxil 40 mg at bedtime.  Discussed risks and benefits of medication.  Recommend to call us back otherwise I will see her in 3 months. Time spent 25 minutes.  More than 50% of the  time spent in psychoeducation, counseling and coordination of care.  Discuss safety plan that anytime having active suicidal thoughts or homicidal thoughts then patient need to call 911 or go to the local emergency room.   Keirstan Iannello T., MD 05/12/2014

## 2014-05-12 NOTE — Telephone Encounter (Signed)
RX was left in nurse's box for Klonopin. Patient was seen today by Dr. Adele Schilder. LMOM for patient to call back.  Plan:  We will get records from her primary care physician Dr. Willette Pa as patient seen her a few days ago. She reported her hemoglobin A1c is about 7 which actually increased from her last hemoglobin A1c 6.8. Recently her insulin doses adjusted and she is taking a new blood pressure medication. She does not want to change her psychotropic medication. She believe her depression and hallucination is under control. I will continue Klonopin 1 mg at bedtime, Geodon 80 mg at bedtime, trazodone 200 mg at bedtime, Paxil 40 mg at bedtime. Discussed risks and benefits of medication. Recommend to call us back otherwise I will see her in 3 months. Time spent 25 minutes. More than 50% of the time spent in psychoeducation, counseling and coordination of care. Discuss safety plan that anytime having active suicidal thoughts or homicidal thoughts then patient need to call 911 or go to the local emergency room.   ARFEEN,SYED T., MD

## 2014-05-23 ENCOUNTER — Other Ambulatory Visit (HOSPITAL_COMMUNITY): Payer: Self-pay | Admitting: Psychiatry

## 2014-06-01 ENCOUNTER — Other Ambulatory Visit (HOSPITAL_COMMUNITY): Payer: Self-pay | Admitting: Psychiatry

## 2014-06-01 NOTE — Telephone Encounter (Signed)
Request for refill of Clonazepam. Chart reviewed, refill not appropriate. Filled 05/12/14 with 2 refills given. Denied at this time.

## 2014-07-18 DIAGNOSIS — E119 Type 2 diabetes mellitus without complications: Secondary | ICD-10-CM | POA: Diagnosis not present

## 2014-07-18 DIAGNOSIS — I10 Essential (primary) hypertension: Secondary | ICD-10-CM | POA: Diagnosis not present

## 2014-07-18 DIAGNOSIS — E039 Hypothyroidism, unspecified: Secondary | ICD-10-CM | POA: Diagnosis not present

## 2014-07-25 ENCOUNTER — Telehealth (HOSPITAL_COMMUNITY): Payer: Self-pay

## 2014-07-25 NOTE — Telephone Encounter (Signed)
Telephone call with Hildred Alamin, pharmacist at [facility] to verify Dr. Adele Schilder was aware of potential drug interaction between Geodon and Trazodone patient is prescribed as patient has been on both medications for several years and sees her PCP regularly to monitor any potential QT concerns.

## 2014-07-27 ENCOUNTER — Telehealth (HOSPITAL_COMMUNITY): Payer: Self-pay

## 2014-07-27 NOTE — Telephone Encounter (Signed)
Telephone call from patient requesting a refill of her Klonopin as states she is changing pharmacies to [facility] and would like to get new orders there.  [place] to verify last dates of refills from 05/12/14 prescription as was filled on 06/01/14 and 07/01/14.  Called patient back to inform she had one refill left at [facility] of her prescribed Klonopin which she should be able to fill on 07/31/14.  Reminded patient she is scheduled to see Dr. Adele Schilder again on 08/11/14 and would received new prescriptions then which she could take to her new pharmacy.  Patient stated agreeing with plan and will call if any further problems.

## 2014-08-11 ENCOUNTER — Ambulatory Visit (INDEPENDENT_AMBULATORY_CARE_PROVIDER_SITE_OTHER): Payer: Medicare Other | Admitting: Psychiatry

## 2014-08-11 ENCOUNTER — Encounter (HOSPITAL_COMMUNITY): Payer: Self-pay | Admitting: Psychiatry

## 2014-08-11 VITALS — BP 131/73 | HR 84 | Ht 62.0 in | Wt 264.0 lb

## 2014-08-11 DIAGNOSIS — F419 Anxiety disorder, unspecified: Secondary | ICD-10-CM

## 2014-08-11 DIAGNOSIS — F200 Paranoid schizophrenia: Secondary | ICD-10-CM

## 2014-08-11 DIAGNOSIS — E782 Mixed hyperlipidemia: Secondary | ICD-10-CM | POA: Diagnosis not present

## 2014-08-11 DIAGNOSIS — I10 Essential (primary) hypertension: Secondary | ICD-10-CM | POA: Diagnosis not present

## 2014-08-11 DIAGNOSIS — E119 Type 2 diabetes mellitus without complications: Secondary | ICD-10-CM | POA: Diagnosis not present

## 2014-08-11 MED ORDER — ZIPRASIDONE HCL 80 MG PO CAPS: 80.0000 mg | ORAL_CAPSULE | Freq: Every day | ORAL | Status: DC

## 2014-08-11 MED ORDER — PAROXETINE HCL 40 MG PO TABS: 40.0000 mg | ORAL_TABLET | ORAL | Status: DC

## 2014-08-11 MED ORDER — TRAZODONE HCL 100 MG PO TABS: 200.0000 mg | ORAL_TABLET | Freq: Every day | ORAL | Status: DC

## 2014-08-11 MED ORDER — CLONAZEPAM 1 MG PO TABS: 1.0000 mg | ORAL_TABLET | Freq: Every day | ORAL | Status: DC

## 2014-08-11 NOTE — Progress Notes (Signed)
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  08/11/2014 12:31 PM  Chief Complaint: Medication management and followup     History of Present Illness: Chelsea [name] came for her followup appointment.  She is taking her medication as prescribed.  She denies any side effects.  Recently she need to change her pharmacy because she could not afford her medication at her previous pharmacy.  She saw her primary care physician and had blood work.  She is hoping her hemoglobin A1c come down.  She is taking more medication for her diabetes.  Overall her mood has been stable.  She denies any paranoia, hallucination, irritability, anger or any depressive symptoms.  She denies any feeling of hopelessness or worthlessness.  Her appetite is okay.  She has good energy level.  She has lost the weight in recent months and she feels good about it.  She has no tremors or shakes.  Patient denies drinking or using any illegal substances.  She lives by herself.  She has no children.  Suicidal Ideation: No Plan Formed: No Patient has means to carry out plan: No  Homicidal Ideation: No Plan Formed: No Patient has means to carry out plan: No  Review of Systems  Constitutional: Negative.   Cardiovascular: Negative for chest pain and palpitations.  Musculoskeletal: Positive for back pain.  Neurological: Negative for dizziness and tremors.  Psychiatric/Behavioral: Negative.     Psychiatric: Agitation: No Hallucination: No  Depressed Mood: No Insomnia: No Hypersomnia: No Altered Concentration: No Feels Worthless: No Grandiose Ideas: No Belief In Special Powers: No New/Increased Substance Abuse: No Compulsions: No  Neurologic: Headache: Yes Seizure: No Paresthesias: No  Medical history. Hypertension, morbid obesity, hyperlipidemia, sleep apnea and arthritis.  Her primary care physician is Dr. Jalene Mullet.    Outpatient Encounter Prescriptions as of 08/11/2014   Medication Sig  . albuterol (PROVENTIL HFA;VENTOLIN HFA) 108 (90 BASE) MCG/ACT inhaler Inhale 2 puffs into the lungs every 6 (six) hours as needed. Inhale 2 puffs every 6 hrs prn.  Inhale 2 puffs every 4-6 hrs spaced 60 sec apart.  Marland Kitchen aspirin 81 MG chewable tablet Chew 81 mg by mouth daily.  Marland Kitchen atorvastatin (LIPITOR) 20 MG tablet Take 20 mg by mouth daily.  . bumetanide (BUMEX) 1 MG tablet Take 1 tablet (1 mg total) by mouth daily.  . canagliflozin (INVOKANA) 300 MG TABS tablet Take 300 mg by mouth daily before breakfast.  . clonazePAM (KLONOPIN) 1 MG tablet Take 1 tablet (1 mg total) by mouth at bedtime.  . fluticasone (FLONASE) 50 MCG/ACT nasal spray Place 2 sprays into the nose daily.  . Insulin Glargine (TOUJEO SOLOSTAR ) Inject 85 Units into the skin at bedtime.  Marland Kitchen levothyroxine (SYNTHROID) 150 MCG tablet Take 150 mcg by mouth daily.  Marland Kitchen losartan (COZAAR) 50 MG tablet Take 50 mg by mouth daily.  . metFORMIN (GLUCOPHAGE) 1000 MG tablet Take 1,000 mg by mouth 2 (two) times daily with a meal.  . PARoxetine (PAXIL) 40 MG tablet Take 1 tablet (40 mg total) by mouth every morning.  . potassium chloride (K-DUR) 10 MEQ tablet Take 1 tablet (10 mEq total) by mouth daily.  . traZODone (DESYREL) 100 MG tablet Take 2 tablets (200 mg total) by mouth at bedtime.  . ziprasidone (GEODON) 80 MG capsule Take 1 capsule (80 mg total) by mouth at bedtime.  . [DISCONTINUED] clonazePAM (KLONOPIN) 1 MG tablet Take 1 tablet (1 mg total) by mouth at bedtime.  . [DISCONTINUED]  PARoxetine (PAXIL) 40 MG tablet Take 1 tablet (40 mg total) by mouth every morning.  . [DISCONTINUED] traZODone (DESYREL) 100 MG tablet Take 2 tablets (200 mg total) by mouth at bedtime.  . [DISCONTINUED] ziprasidone (GEODON) 80 MG capsule Take 1 capsule (80 mg total) by mouth at bedtime.  Marland Kitchen acetaminophen (TYLENOL) 325 MG tablet Take 2 tablets (650 mg total) by mouth every 6 (six) hours as needed for pain. (Patient not taking: Reported on  08/11/2014)  . Insulin Detemir (LEVEMIR FLEXPEN) 100 UNIT/ML SOPN Inject 70 Units into the skin at bedtime.   . pioglitazone (ACTOS) 45 MG tablet Take 45 mg by mouth daily.   No facility-administered encounter medications on file as of 08/11/2014.    Past Psychiatric History/Hospitalization(s): Patient has long history of psychiatric illness.  She was admitted at [facility] and [facility].  There is no history of suicidal attempt however she has history of mania and psychosis.  She admitted talking to the picture and herself in the past.  She do not remember what medicine she had tried in the past.   Anxiety: Yes Bipolar Disorder: No Depression: Yes Mania: No Psychosis: Yes Schizophrenia: Yes Personality Disorder: No Hospitalization for psychiatric illness: Yes History of Electroconvulsive Shock Therapy: No Prior Suicide Attempts: No  Physical Exam: Constitutional:  BP 131/73 mmHg  Pulse 84  Ht 5\' 2"  (1.575 m)  Wt 264 lb (119.75 kg)  BMI 48.27 kg/m2  General Appearance: obese and Maintained fair eye contact.  She is casually dressed and fairly groomed.  Musculoskeletal: Strength & Muscle Tone: Back pain Gait & Station: normal, slow to walk Patient leans: N/A  Mental status examination: Patient is a elderly female who is pleasant and cooperative.  She maintained fair eye contact.  Her speech is slow , soft but clear and coherent.  Her thought process is slow but logical.  She denies any auditory or visual hallucination.  Her attention and concentration is fair.  There were no delusions, paranoia or any obsessive thoughts.  She denies any active or passive suicidal thoughts or homicidal thought.  Her psychomotor activity is slow.  There were no tremors, shakes or any EPS.  Her fund of knowledge is fair.  Her cognition is grossly intact.  She's alert and oriented 3.  Her insight judgment and impulse control is okay.  Established Problem, Stable/Improving (1), Review of Last  Therapy Session (1) and Review of Medication Regimen & Side Effects (2)  Assessment: Axis I: Schizophrenia chronic paranoid type, anxiety disorder NOS  Axis II: Deferred  Axis III: See medical history.   Plan:  Patient is stable on her medication.  She has no paranoia or any hallucination.  I will continue Geodon 80 mg at bedtime, Klonopin 1 mg at bedtime Paxil 40 mg daily and trazodone 200 mg at bedtime.  Discussed medication side effects and benefits.  Recommended to call us back if she has any question or any concern.  Follow-up in 3 months.   Tryone Kille T., MD 08/11/2014

## 2014-08-23 DIAGNOSIS — E119 Type 2 diabetes mellitus without complications: Secondary | ICD-10-CM | POA: Diagnosis not present

## 2014-08-23 DIAGNOSIS — I10 Essential (primary) hypertension: Secondary | ICD-10-CM | POA: Diagnosis not present

## 2014-09-06 DIAGNOSIS — E119 Type 2 diabetes mellitus without complications: Secondary | ICD-10-CM | POA: Diagnosis not present

## 2014-09-06 DIAGNOSIS — M179 Osteoarthritis of knee, unspecified: Secondary | ICD-10-CM | POA: Diagnosis not present

## 2014-10-09 DIAGNOSIS — E119 Type 2 diabetes mellitus without complications: Secondary | ICD-10-CM | POA: Diagnosis not present

## 2014-10-09 DIAGNOSIS — N390 Urinary tract infection, site not specified: Secondary | ICD-10-CM | POA: Diagnosis not present

## 2014-10-18 ENCOUNTER — Other Ambulatory Visit (HOSPITAL_COMMUNITY): Payer: Self-pay | Admitting: Psychiatry

## 2014-10-23 ENCOUNTER — Other Ambulatory Visit (HOSPITAL_COMMUNITY): Payer: Self-pay | Admitting: Psychiatry

## 2014-11-14 DIAGNOSIS — I10 Essential (primary) hypertension: Secondary | ICD-10-CM | POA: Diagnosis not present

## 2014-11-14 DIAGNOSIS — E119 Type 2 diabetes mellitus without complications: Secondary | ICD-10-CM | POA: Diagnosis not present

## 2014-11-14 DIAGNOSIS — Z136 Encounter for screening for cardiovascular disorders: Secondary | ICD-10-CM | POA: Diagnosis not present

## 2014-11-17 ENCOUNTER — Ambulatory Visit (HOSPITAL_COMMUNITY): Payer: Self-pay | Admitting: Psychiatry

## 2014-11-20 ENCOUNTER — Other Ambulatory Visit (HOSPITAL_COMMUNITY): Payer: Self-pay | Admitting: Psychiatry

## 2014-11-20 DIAGNOSIS — F200 Paranoid schizophrenia: Secondary | ICD-10-CM

## 2014-11-21 NOTE — Telephone Encounter (Signed)
Met with Dr. Adele Schilder who authorized a refill of patient's prescribed Clonazepam, Paxil, Trazodone, and Geodon as patient's appointment for 11/17/14 was moved back to 12/01/14.  Called in one time new Clonazepam order, 84m, 1 tablet by mouth nightly at bedtime, #30 with [facility] refills [facility]  E-scribed in one time authorized refills of patient's Geodon, T[facility]

## 2014-12-01 ENCOUNTER — Encounter (HOSPITAL_COMMUNITY): Payer: Self-pay | Admitting: Psychiatry

## 2014-12-01 ENCOUNTER — Ambulatory Visit (INDEPENDENT_AMBULATORY_CARE_PROVIDER_SITE_OTHER): Payer: Medicare Other | Admitting: Psychiatry

## 2014-12-01 VITALS — BP 116/64 | HR 73 | Ht 62.0 in | Wt 259.0 lb

## 2014-12-01 DIAGNOSIS — F200 Paranoid schizophrenia: Secondary | ICD-10-CM

## 2014-12-01 DIAGNOSIS — F419 Anxiety disorder, unspecified: Secondary | ICD-10-CM

## 2014-12-01 MED ORDER — TRAZODONE HCL 100 MG PO TABS: 200.0000 mg | ORAL_TABLET | Freq: Every day | ORAL | Status: DC

## 2014-12-01 MED ORDER — PAROXETINE HCL 40 MG PO TABS: 40.0000 mg | ORAL_TABLET | Freq: Every morning | ORAL | Status: DC

## 2014-12-01 MED ORDER — ZIPRASIDONE HCL 80 MG PO CAPS: 80.0000 mg | ORAL_CAPSULE | Freq: Every day | ORAL | Status: DC

## 2014-12-01 MED ORDER — CLONAZEPAM 1 MG PO TABS: ORAL_TABLET | ORAL | Status: DC

## 2014-12-01 NOTE — Progress Notes (Signed)
[facility] Progress Note  Chelsea [name] [id number] 64 y.o.  12/01/2014 11:59 AM  Chief Complaint: Medication management and followup     History of Present Illness: Chelsea [name] came for her followup appointment.  She recently seen her primary care physician and started Crestor.  Overall her mood has been stable.  She sleeping good.  She denies any paranoia or any hallucination.  She has lost weight and she is happy about it.  She is more into healthy habits of eating .  She denies any feeling of hopelessness or worthlessness.  She wants to continue her current medication.  She denies drinking or using any illegal substances.  Her appetite is okay.  She sleeping good.  Patient lives by herself and she has no children.  Suicidal Ideation: No Plan Formed: No Patient has means to carry out plan: No  Homicidal Ideation: No Plan Formed: No Patient has means to carry out plan: No  Review of Systems  Constitutional: Negative.   Cardiovascular: Negative for chest pain and palpitations.  Musculoskeletal: Positive for back pain.  Neurological: Negative for dizziness and tremors.  Psychiatric/Behavioral: Negative.     Psychiatric: Agitation: No Hallucination: No  Depressed Mood: No Insomnia: No Hypersomnia: No Altered Concentration: No Feels Worthless: No Grandiose Ideas: No Belief In Special Powers: No New/Increased Substance Abuse: No Compulsions: No  Neurologic: Headache: Yes Seizure: No Paresthesias: No  Medical history. Hypertension, morbid obesity, hyperlipidemia, sleep apnea and arthritis.  Her primary care physician is Dr. Jalene Mullet.    Outpatient Encounter Prescriptions as of 12/01/2014  Medication Sig  . acetaminophen (TYLENOL) 325 MG tablet Take 2 tablets (650 mg total) by mouth every 6 (six) hours as needed for pain. (Patient not taking: Reported on 08/11/2014)  . albuterol (PROVENTIL HFA;VENTOLIN HFA) 108 (90 BASE) MCG/ACT inhaler Inhale 2 puffs  into the lungs every 6 (six) hours as needed. Inhale 2 puffs every 6 hrs prn.  Inhale 2 puffs every 4-6 hrs spaced 60 sec apart.  Marland Kitchen aspirin 81 MG chewable tablet Chew 81 mg by mouth daily.  . bumetanide (BUMEX) 1 MG tablet Take 1 tablet (1 mg total) by mouth daily.  . canagliflozin (INVOKANA) 300 MG TABS tablet Take 300 mg by mouth daily before breakfast.  . clonazePAM (KLONOPIN) 1 MG tablet TAKE 1 TABLET BY MOUTH NIGHTLY AT BEDTIME  . fluticasone (FLONASE) 50 MCG/ACT nasal spray Place 2 sprays into the nose daily.  . Insulin Detemir (LEVEMIR FLEXPEN) 100 UNIT/ML SOPN Inject 70 Units into the skin at bedtime.   . Insulin Glargine (TOUJEO SOLOSTAR [place]) Inject 85 Units into the skin at bedtime.  Marland Kitchen levothyroxine (SYNTHROID) 150 MCG tablet Take 150 mcg by mouth daily.  Marland Kitchen losartan (COZAAR) 50 MG tablet Take 50 mg by mouth daily.  . metFORMIN (GLUCOPHAGE) 1000 MG tablet Take 1,000 mg by mouth 2 (two) times daily with a meal.  . PARoxetine (PAXIL) 40 MG tablet Take 1 tablet (40 mg total) by mouth every morning.  . pioglitazone (ACTOS) 45 MG tablet Take 45 mg by mouth daily.  . potassium chloride (K-DUR) 10 MEQ tablet Take 1 tablet (10 mEq total) by mouth daily.  . rosuvastatin (CRESTOR) 5 MG tablet Take 5 mg by mouth.  . traZODone (DESYREL) 100 MG tablet Take 2 tablets (200 mg total) by mouth at bedtime.  . ziprasidone (GEODON) 80 MG capsule Take 1 capsule (80 mg total) by mouth at bedtime.  . [DISCONTINUED] atorvastatin (LIPITOR) 20 MG tablet Take 20  mg by mouth daily.  . [DISCONTINUED] clonazePAM (KLONOPIN) 1 MG tablet TAKE 1 TABLET BY MOUTH NIGHTLY AT BEDTIME  . [DISCONTINUED] PARoxetine (PAXIL) 40 MG tablet TAKE 1 TABLET BY MOUTH EVERY MORNING  . [DISCONTINUED] traZODone (DESYREL) 100 MG tablet TAKE 2 TABLETS BY MOUTH AT BEDTIME  . [DISCONTINUED] ziprasidone (GEODON) 80 MG capsule TAKE 1 CAPSULE BY MOUTH AT BEDTIME   No facility-administered encounter medications on file as of 12/01/2014.    Past  Psychiatric History/Hospitalization(s): Patient has long history of psychiatric illness.  She was admitted at [facility] and [facility].  There is no history of suicidal attempt however she has history of mania and psychosis.  She admitted talking to the picture and herself in the past.  She do not remember what medicine she had tried in the past.   Anxiety: Yes Bipolar Disorder: No Depression: Yes Mania: No Psychosis: Yes Schizophrenia: Yes Personality Disorder: No Hospitalization for psychiatric illness: Yes History of Electroconvulsive Shock Therapy: No Prior Suicide Attempts: No  Physical Exam: Constitutional:  BP 116/64 mmHg  Pulse 73  Ht 5\' 2"  (1.575 m)  Wt 259 lb (117.482 kg)  BMI 47.36 kg/m2  General Appearance: obese and Maintained fair eye contact.  She is casually dressed and fairly groomed.  Musculoskeletal: Strength & Muscle Tone: Back pain Gait & Station: normal, slow to walk Patient leans: N/A  Mental status examination: Patient is a elderly female who is pleasant and cooperative.  She maintained fair eye contact.  Her speech is slow , soft but clear and coherent.  Her thought process is slow but logical.  She denies any auditory or visual hallucination.  Her attention and concentration is fair.  There were no delusions, paranoia or any obsessive thoughts.  She denies any active or passive suicidal thoughts or homicidal thought.  Her psychomotor activity is slow.  There were no tremors, shakes or any EPS.  Her fund of knowledge is fair.  Her cognition is grossly intact.  She's alert and oriented 3.  Her insight judgment and impulse control is okay.  Established Problem, Stable/Improving (1), Review of Last Therapy Session (1) and Review of Medication Regimen & Side Effects (2)  Assessment: Axis I: Schizophrenia chronic paranoid type, anxiety disorder NOS  Axis II: Deferred  Axis III: See medical history.   Plan:  Patient is stable on her medication.   She has no paranoia or any hallucination.  I will continue Geodon 80 mg at bedtime, Klonopin 1 mg at bedtime Paxil 40 mg daily and trazodone 200 mg at bedtime.  Discussed medication side effects and benefits.  Recommended to call us back if she has any question or any concern.  Follow-up in 3 months.   Lycia Sachdeva T., MD 12/01/2014

## 2015-01-18 ENCOUNTER — Telehealth (HOSPITAL_COMMUNITY): Payer: Self-pay

## 2015-01-18 ENCOUNTER — Other Ambulatory Visit (HOSPITAL_COMMUNITY): Payer: Self-pay | Admitting: Psychiatry

## 2015-01-18 DIAGNOSIS — F200 Paranoid schizophrenia: Secondary | ICD-10-CM

## 2015-01-18 NOTE — Telephone Encounter (Signed)
Met with Dr. Adele Schilder and agreed to assist patient with being set up for a baseline EKG but for patient to go ahead with current medications as has been taking these a long time without problems.  Called patient to inform Dr. Adele Schilder would be ordering a baseline EKG 12 lead and would assist with setting this up with the Heart and Vascular department on the first floor at [facility].  Patient agreed with plan and order entered but department was closed by the time we called. Will set up in the coming week and called patient's [place] back and spoke with Hildred Alamin, pharmacist to inform Dr. Adele Schilder authorized continued refill of patient's prescribed Trazodone and Geodon and patient would be getting set up for a baseline EKG.  Heart and Vascular Department phone number [phone]

## 2015-01-18 NOTE — Telephone Encounter (Signed)
Medication management - Telephone message left from pharmacist at [facility] with concern Trazodone and Geodon both being prescribed for pt. due to concern for prolonged QT elongation?  Requests verification MD approves and is aware of both orders.

## 2015-01-18 NOTE — Telephone Encounter (Signed)
Patient on these medication for a while and does not express any side effects.  We can get EKG as outpatient from her primary care physician.  Please call her primary care physician to get EKG rule out prolonged QTC interval.

## 2015-01-19 NOTE — Telephone Encounter (Signed)
Called to set up EKG appointment at [phone], appointment made for 01/22/15 at [place] to notify patient of said appointment to which she stated she has no ride. Patient gave her sister the phone, sister took down the number to change the appointment time that will fit her schedule to give patient a ride to the vascular center.

## 2015-01-22 ENCOUNTER — Other Ambulatory Visit (HOSPITAL_COMMUNITY): Payer: Self-pay

## 2015-01-29 ENCOUNTER — Ambulatory Visit (HOSPITAL_COMMUNITY)
Admission: RE | Admit: 2015-01-29 | Discharge: 2015-01-29 | Disposition: A | Discharge status: Home / Self Care | Payer: Medicare Other | Source: Ambulatory Visit | Attending: Psychiatry | Admitting: Psychiatry

## 2015-01-29 DIAGNOSIS — F200 Paranoid schizophrenia: Secondary | ICD-10-CM | POA: Insufficient documentation

## 2015-01-29 DIAGNOSIS — R9431 Abnormal electrocardiogram [ECG] [EKG]: Secondary | ICD-10-CM | POA: Insufficient documentation

## 2015-02-07 ENCOUNTER — Telehealth (HOSPITAL_COMMUNITY): Payer: Self-pay | Admitting: *Deleted

## 2015-02-07 NOTE — Telephone Encounter (Signed)
Pharmacist from [facility] called concerned that patient wants to refill Trazadone too soon. Pt has only used 19 days and should have 11 more days before needing refill. Pt states she is out of trazadone. Juliann Pulse will refill if approved by this office.

## 2015-02-09 ENCOUNTER — Other Ambulatory Visit (HOSPITAL_COMMUNITY): Payer: Self-pay | Admitting: *Deleted

## 2015-02-09 ENCOUNTER — Telehealth (HOSPITAL_COMMUNITY): Payer: Self-pay | Admitting: *Deleted

## 2015-02-09 ENCOUNTER — Other Ambulatory Visit (HOSPITAL_COMMUNITY): Payer: Self-pay | Admitting: Psychiatry

## 2015-02-09 NOTE — Telephone Encounter (Signed)
Called pharmacy to verify that Dr. Adele Schilder does authorize an early refill of Trazodone for a one time order. Spoke with Hulda Marin at the pharmacy Pt notified that early refills will not be given at a later date and she must count all pills when she leaves the pharmacy to verify correct counts. Pt insurance will not authorize early refill but pharmacy will give the option of paying out of pocket for medication.

## 2015-02-09 NOTE — Telephone Encounter (Signed)
Per Dr. Polly Cobia early refills of Trazodone. Called to notify pharmacy.

## 2015-02-09 NOTE — Telephone Encounter (Signed)
Pt called stating she needs a refill of the Trazodone because she only has 4 pills left. Asking what she is supposed to do because she can not sleep. States she has been taking only 2 pills at night and not taken extra. Asked that this Probation officer inform her Doctor.

## 2015-02-12 ENCOUNTER — Other Ambulatory Visit (HOSPITAL_COMMUNITY): Payer: Self-pay | Admitting: Psychiatry

## 2015-02-12 DIAGNOSIS — Z23 Encounter for immunization: Secondary | ICD-10-CM | POA: Diagnosis not present

## 2015-02-12 DIAGNOSIS — E039 Hypothyroidism, unspecified: Secondary | ICD-10-CM | POA: Diagnosis not present

## 2015-02-12 DIAGNOSIS — J069 Acute upper respiratory infection, unspecified: Secondary | ICD-10-CM | POA: Diagnosis not present

## 2015-02-12 DIAGNOSIS — E119 Type 2 diabetes mellitus without complications: Secondary | ICD-10-CM | POA: Diagnosis not present

## 2015-02-16 DIAGNOSIS — E119 Type 2 diabetes mellitus without complications: Secondary | ICD-10-CM | POA: Diagnosis not present

## 2015-02-16 DIAGNOSIS — M179 Osteoarthritis of knee, unspecified: Secondary | ICD-10-CM | POA: Diagnosis not present

## 2015-02-16 DIAGNOSIS — E039 Hypothyroidism, unspecified: Secondary | ICD-10-CM | POA: Diagnosis not present

## 2015-02-19 ENCOUNTER — Other Ambulatory Visit (HOSPITAL_COMMUNITY): Payer: Self-pay | Admitting: Psychiatry

## 2015-02-20 NOTE — Telephone Encounter (Signed)
New prescription for Clonazepam declined as was filled on 02/19/15 and patient returns on 03/07/15 for next evaluation.

## 2015-03-05 DIAGNOSIS — R7989 Other specified abnormal findings of blood chemistry: Secondary | ICD-10-CM | POA: Diagnosis not present

## 2015-03-06 ENCOUNTER — Ambulatory Visit (HOSPITAL_COMMUNITY): Payer: Self-pay | Admitting: Psychiatry

## 2015-03-07 ENCOUNTER — Encounter (HOSPITAL_COMMUNITY): Payer: Self-pay | Admitting: Psychiatry

## 2015-03-07 ENCOUNTER — Ambulatory Visit (INDEPENDENT_AMBULATORY_CARE_PROVIDER_SITE_OTHER): Payer: 59 | Admitting: Psychiatry

## 2015-03-07 VITALS — BP 110/68 | HR 86 | Ht 62.0 in | Wt 262.6 lb

## 2015-03-07 DIAGNOSIS — F200 Paranoid schizophrenia: Secondary | ICD-10-CM

## 2015-03-07 DIAGNOSIS — F419 Anxiety disorder, unspecified: Secondary | ICD-10-CM

## 2015-03-07 MED ORDER — TRAZODONE HCL 100 MG PO TABS: 200.0000 mg | ORAL_TABLET | Freq: Every day | ORAL | Status: DC

## 2015-03-07 MED ORDER — ZIPRASIDONE HCL 80 MG PO CAPS: 80.0000 mg | ORAL_CAPSULE | Freq: Every day | ORAL | Status: DC

## 2015-03-07 MED ORDER — PAROXETINE HCL 40 MG PO TABS: 40.0000 mg | ORAL_TABLET | Freq: Every morning | ORAL | Status: DC

## 2015-03-07 MED ORDER — CLONAZEPAM 1 MG PO TABS: ORAL_TABLET | ORAL | Status: DC

## 2015-03-07 NOTE — Progress Notes (Addendum)
Patient ID: Chelsea [name], female   DOB: 1950-10-04, 64 y.o.   MRN: [id number]  Met with Dr. Adele Schilder who requested this nurse call in patient's new Clonazepam order to [place].  Called and spoke with Jarrett Soho, pharmacist there to give order for Clonazepam 94m, one at bedtime, #30 with 2 refills as instructed by Dr. AAdele Schilder  Original printed order voided out by MD.   EKG finding Reviewed EKG images.  Ventricular rate 74, PR interval 162, QRS 100.  Patient has normal sinus rhythm with left axis deviation.  Patient has no cardiac symptoms.

## 2015-03-07 NOTE — Progress Notes (Signed)
[facility] [phone] Progress Note  Chelsea [name] [id number] 64 y.o.  03/07/2015 4:11 PM  Chief Complaint: Medication management and followup     History of Present Illness: Chelsea [name] came for her followup appointment.  She had EKG in October but we do not have results.  She is taking her medication as prescribed.  She had appointment to see Dr. Rachell Cipro tomorrow.  Overall she describes her paranoia irritability mood is as stable.  She denies any agitation anger or any crying spells.  She sleeping good.  She had a good Thanksgiving.  She denies any suicidal thoughts or any feeling of hopelessness or worthlessness.  Her appetite is okay.  She has no tremors shakes or any EPS.  Her energy level is okay.  Patient lives by herself and she has no children.  Patient denies drinking or using any illegal substances.  Suicidal Ideation: No Plan Formed: No Patient has means to carry out plan: No  Homicidal Ideation: No Plan Formed: No Patient has means to carry out plan: No  Review of Systems  Constitutional: Negative.   Cardiovascular: Negative for chest pain and palpitations.  Musculoskeletal: Positive for back pain.  Neurological: Negative for dizziness and tremors.  Psychiatric/Behavioral: Negative.     Psychiatric: Agitation: No Hallucination: No  Depressed Mood: No Insomnia: No Hypersomnia: No Altered Concentration: No Feels Worthless: No Grandiose Ideas: No Belief In Special Powers: No New/Increased Substance Abuse: No Compulsions: No  Neurologic: Headache: Yes Seizure: No Paresthesias: No  Medical history. Hypertension, morbid obesity, hyperlipidemia, sleep apnea and arthritis.  Her primary care physician is Dr. Jalene Mullet.    Outpatient Encounter Prescriptions as of 03/07/2015  Medication Sig  . acetaminophen (TYLENOL) 325 MG tablet Take 2 tablets (650 mg total) by mouth every 6 (six) hours as needed for pain. (Patient not taking: Reported on  08/11/2014)  . albuterol (PROVENTIL HFA;VENTOLIN HFA) 108 (90 BASE) MCG/ACT inhaler Inhale 2 puffs into the lungs every 6 (six) hours as needed. Inhale 2 puffs every 6 hrs prn.  Inhale 2 puffs every 4-6 hrs spaced 60 sec apart.  Marland Kitchen aspirin 81 MG chewable tablet Chew 81 mg by mouth daily.  Marland Kitchen BENICAR 20 MG tablet Take 20 mg by mouth daily.  . bumetanide (BUMEX) 1 MG tablet Take 1 tablet (1 mg total) by mouth daily.  . canagliflozin (INVOKANA) 300 MG TABS tablet Take 300 mg by mouth daily before breakfast.  . clonazePAM (KLONOPIN) 1 MG tablet TAKE 1 TABLET BY MOUTH NIGHTLY AT BEDTIME  . fluticasone (FLONASE) 50 MCG/ACT nasal spray Place 2 sprays into the nose daily.  . Insulin Detemir (LEVEMIR FLEXPEN) 100 UNIT/ML SOPN Inject 70 Units into the skin at bedtime.   . Insulin Glargine (TOUJEO SOLOSTAR [place]) Inject 85 Units into the skin at bedtime.  Marland Kitchen levothyroxine (SYNTHROID) 150 MCG tablet Take 150 mcg by mouth daily.  . metFORMIN (GLUCOPHAGE) 1000 MG tablet Take 1,000 mg by mouth 2 (two) times daily with a meal.  . PARoxetine (PAXIL) 40 MG tablet Take 1 tablet (40 mg total) by mouth every morning.  . pioglitazone (ACTOS) 45 MG tablet Take 45 mg by mouth daily.  . potassium chloride (K-DUR) 10 MEQ tablet Take 1 tablet (10 mEq total) by mouth daily.  . rosuvastatin (CRESTOR) 5 MG tablet Take 5 mg by mouth.  . traZODone (DESYREL) 100 MG tablet Take 2 tablets (200 mg total) by mouth at bedtime.  . ziprasidone (GEODON) 80 MG capsule Take 1 capsule (80  mg total) by mouth at bedtime.  . [DISCONTINUED] clonazePAM (KLONOPIN) 1 MG tablet TAKE 1 TABLET BY MOUTH NIGHTLY AT BEDTIME  . [DISCONTINUED] losartan (COZAAR) 50 MG tablet Take 50 mg by mouth daily.  . [DISCONTINUED] PARoxetine (PAXIL) 40 MG tablet Take 1 tablet (40 mg total) by mouth every morning.  . [DISCONTINUED] traZODone (DESYREL) 100 MG tablet Take 2 tablets (200 mg total) by mouth at bedtime.  . [DISCONTINUED] ziprasidone (GEODON) 80 MG capsule Take  1 capsule (80 mg total) by mouth at bedtime.   No facility-administered encounter medications on file as of 03/07/2015.    Past Psychiatric History/Hospitalization(s): Patient has long history of psychiatric illness.  She was admitted at [facility] and [facility].  There is no history of suicidal attempt however she has history of mania and psychosis.  She admitted talking to the picture and herself in the past.  She do not remember what medicine she had tried in the past.   Anxiety: Yes Bipolar Disorder: No Depression: Yes Mania: No Psychosis: Yes Schizophrenia: Yes Personality Disorder: No Hospitalization for psychiatric illness: Yes History of Electroconvulsive Shock Therapy: No Prior Suicide Attempts: No  Physical Exam: Constitutional:  BP 110/68 mmHg  Pulse 86  Ht 5\' 2"  (1.575 m)  Wt 262 lb 9.6 oz (119.115 kg)  BMI 48.02 kg/m2  General Appearance: obese and Maintained fair eye contact.  She is casually dressed and fairly groomed.  Musculoskeletal: Strength & Muscle Tone: Back pain Gait & Station: normal, slow to walk Patient leans: N/A  Mental status examination: Patient is a elderly obese female who is pleasant and cooperative.  She maintained fair eye contact.  Her speech is slow , soft but clear and coherent.  Her thought process is slow but logical.  She denies any auditory or visual hallucination.  Her attention and concentration is fair.  There were no delusions, paranoia or any obsessive thoughts.  She denies any active or passive suicidal thoughts or homicidal thought.  Her psychomotor activity is slow.  There were no tremors, shakes or any EPS.  Her fund of knowledge is fair.  Her cognition is grossly intact.  She's alert and oriented 3.  Her insight judgment and impulse control is okay.  Established Problem, Stable/Improving (1), Review of Last Therapy Session (1) and Review of Medication Regimen & Side Effects (2)  Assessment: Axis I: Schizophrenia  chronic paranoid type, anxiety disorder NOS  Axis II: Deferred  Axis III: See medical history.  Plan:  Patient is stable on her medication.  We will get her EKG results .  She has no chest pain, palpitation or any other concern.  She wants to continue her current psychiatric medication.  She has no paranoia or any hallucination.  I will continue Geodon 80 mg at bedtime, Klonopin 1 mg at bedtime Paxil 40 mg daily and trazodone 200 mg at bedtime.  Discussed medication side effects and benefits.  Recommended to call us back if she has any question or any concern.  Follow-up in 3 months.   ARFEEN,SYED T., MD 03/07/2015

## 2015-03-08 DIAGNOSIS — Z23 Encounter for immunization: Secondary | ICD-10-CM | POA: Diagnosis not present

## 2015-03-08 DIAGNOSIS — M179 Osteoarthritis of knee, unspecified: Secondary | ICD-10-CM | POA: Diagnosis not present

## 2015-03-08 DIAGNOSIS — E119 Type 2 diabetes mellitus without complications: Secondary | ICD-10-CM | POA: Diagnosis not present

## 2015-03-08 DIAGNOSIS — N19 Unspecified kidney failure: Secondary | ICD-10-CM | POA: Diagnosis not present

## 2015-03-08 DIAGNOSIS — R350 Frequency of micturition: Secondary | ICD-10-CM | POA: Diagnosis not present

## 2015-03-08 DIAGNOSIS — Z0000 Encounter for general adult medical examination without abnormal findings: Secondary | ICD-10-CM | POA: Diagnosis not present

## 2015-03-08 DIAGNOSIS — N390 Urinary tract infection, site not specified: Secondary | ICD-10-CM | POA: Diagnosis not present

## 2015-04-06 ENCOUNTER — Other Ambulatory Visit: Payer: Self-pay

## 2015-04-06 DIAGNOSIS — Z1231 Encounter for screening mammogram for malignant neoplasm of breast: Secondary | ICD-10-CM

## 2015-04-27 DIAGNOSIS — G4733 Obstructive sleep apnea (adult) (pediatric): Secondary | ICD-10-CM | POA: Diagnosis not present

## 2015-04-27 DIAGNOSIS — E662 Morbid (severe) obesity with alveolar hypoventilation: Secondary | ICD-10-CM | POA: Diagnosis not present

## 2015-04-27 DIAGNOSIS — J329 Chronic sinusitis, unspecified: Secondary | ICD-10-CM | POA: Diagnosis not present

## 2015-04-28 ENCOUNTER — Other Ambulatory Visit (HOSPITAL_COMMUNITY): Payer: Self-pay | Admitting: Psychiatry

## 2015-04-30 ENCOUNTER — Other Ambulatory Visit (HOSPITAL_COMMUNITY): Payer: Self-pay | Admitting: Psychiatry

## 2015-04-30 ENCOUNTER — Other Ambulatory Visit: Payer: Self-pay

## 2015-04-30 ENCOUNTER — Ambulatory Visit
Admission: RE | Admit: 2015-04-30 | Discharge: 2015-04-30 | Disposition: A | Discharge status: Home / Self Care | Payer: Medicare Other | Source: Ambulatory Visit

## 2015-04-30 DIAGNOSIS — Z1231 Encounter for screening mammogram for malignant neoplasm of breast: Secondary | ICD-10-CM

## 2015-05-10 DIAGNOSIS — G4733 Obstructive sleep apnea (adult) (pediatric): Secondary | ICD-10-CM | POA: Diagnosis not present

## 2015-05-10 DIAGNOSIS — E662 Morbid (severe) obesity with alveolar hypoventilation: Secondary | ICD-10-CM | POA: Diagnosis not present

## 2015-05-10 DIAGNOSIS — E039 Hypothyroidism, unspecified: Secondary | ICD-10-CM | POA: Diagnosis not present

## 2015-05-10 DIAGNOSIS — E119 Type 2 diabetes mellitus without complications: Secondary | ICD-10-CM | POA: Diagnosis not present

## 2015-05-10 DIAGNOSIS — Z23 Encounter for immunization: Secondary | ICD-10-CM | POA: Diagnosis not present

## 2015-05-10 DIAGNOSIS — R0600 Dyspnea, unspecified: Secondary | ICD-10-CM | POA: Diagnosis not present

## 2015-05-11 ENCOUNTER — Ambulatory Visit
Admission: RE | Admit: 2015-05-11 | Discharge: 2015-05-11 | Disposition: A | Discharge status: Home / Self Care | Payer: Medicare Other | Source: Ambulatory Visit | Attending: Family Medicine | Admitting: Family Medicine

## 2015-05-11 ENCOUNTER — Other Ambulatory Visit: Payer: Self-pay | Admitting: Family Medicine

## 2015-05-11 DIAGNOSIS — R0602 Shortness of breath: Secondary | ICD-10-CM

## 2015-05-17 DIAGNOSIS — I10 Essential (primary) hypertension: Secondary | ICD-10-CM | POA: Diagnosis not present

## 2015-05-17 DIAGNOSIS — E039 Hypothyroidism, unspecified: Secondary | ICD-10-CM | POA: Diagnosis not present

## 2015-05-17 DIAGNOSIS — R0602 Shortness of breath: Secondary | ICD-10-CM | POA: Diagnosis not present

## 2015-05-17 DIAGNOSIS — I509 Heart failure, unspecified: Secondary | ICD-10-CM | POA: Diagnosis not present

## 2015-05-18 ENCOUNTER — Other Ambulatory Visit (HOSPITAL_COMMUNITY): Payer: Self-pay | Admitting: Psychiatry

## 2015-05-21 DIAGNOSIS — E875 Hyperkalemia: Secondary | ICD-10-CM | POA: Diagnosis not present

## 2015-05-22 DIAGNOSIS — R0789 Other chest pain: Secondary | ICD-10-CM | POA: Diagnosis not present

## 2015-05-22 DIAGNOSIS — R0602 Shortness of breath: Secondary | ICD-10-CM | POA: Diagnosis not present

## 2015-05-22 DIAGNOSIS — R9431 Abnormal electrocardiogram [ECG] [EKG]: Secondary | ICD-10-CM | POA: Diagnosis not present

## 2015-05-22 DIAGNOSIS — E662 Morbid (severe) obesity with alveolar hypoventilation: Secondary | ICD-10-CM | POA: Diagnosis not present

## 2015-05-28 DIAGNOSIS — R0602 Shortness of breath: Secondary | ICD-10-CM | POA: Diagnosis not present

## 2015-05-30 DIAGNOSIS — N19 Unspecified kidney failure: Secondary | ICD-10-CM | POA: Diagnosis not present

## 2015-05-30 DIAGNOSIS — E119 Type 2 diabetes mellitus without complications: Secondary | ICD-10-CM | POA: Diagnosis not present

## 2015-05-30 DIAGNOSIS — G4733 Obstructive sleep apnea (adult) (pediatric): Secondary | ICD-10-CM | POA: Diagnosis not present

## 2015-05-31 ENCOUNTER — Encounter: Payer: Self-pay | Admitting: Gastroenterology

## 2015-06-07 ENCOUNTER — Encounter (HOSPITAL_COMMUNITY): Payer: Self-pay | Admitting: Psychiatry

## 2015-06-07 ENCOUNTER — Ambulatory Visit (HOSPITAL_COMMUNITY): Payer: Self-pay | Admitting: Psychiatry

## 2015-06-07 ENCOUNTER — Ambulatory Visit (INDEPENDENT_AMBULATORY_CARE_PROVIDER_SITE_OTHER): Payer: 59 | Admitting: Psychiatry

## 2015-06-07 VITALS — BP 109/65 | HR 89 | Ht 62.0 in | Wt 261.4 lb

## 2015-06-07 DIAGNOSIS — F200 Paranoid schizophrenia: Secondary | ICD-10-CM

## 2015-06-07 DIAGNOSIS — F419 Anxiety disorder, unspecified: Secondary | ICD-10-CM

## 2015-06-07 MED ORDER — PAROXETINE HCL 40 MG PO TABS: 40.0000 mg | ORAL_TABLET | Freq: Every morning | ORAL | Status: DC

## 2015-06-07 MED ORDER — ZIPRASIDONE HCL 80 MG PO CAPS: 80.0000 mg | ORAL_CAPSULE | Freq: Every day | ORAL | Status: DC

## 2015-06-07 MED ORDER — TRAZODONE HCL 100 MG PO TABS: 200.0000 mg | ORAL_TABLET | Freq: Every day | ORAL | Status: DC

## 2015-06-07 NOTE — Progress Notes (Signed)
[facility] Progress Note  Chelsea [name] [id number] 65 y.o.  06/07/2015 4:06 PM  Chief Complaint: Medication management and followup     History of Present Illness: [name] came for her followup appointment.  She is taking her psychiatric medication as prescribed.  She takes Paxil, Geodon, Klonopin and trazodone.  She sleeping good.  She denies any irritability, anger, severe mood swing or crying spells.  She still gets some time paranoia and anxious around people but denies any aggressive behavior.  Her appetite is okay.  Her vitals are stable.  She has no tremors, shakes or any EPS.  She has been going to see her primary care physician on a regular basis for her health needs .  Her energy level is okay.  Patient lives by herself and she has no children.  Patient denies drinking or using any illegal substances.  Suicidal Ideation: No Plan Formed: No Patient has means to carry out plan: No  Homicidal Ideation: No Plan Formed: No Patient has means to carry out plan: No  Review of Systems  Constitutional: Negative.   Cardiovascular: Negative for chest pain and palpitations.  Musculoskeletal: Positive for back pain.  Neurological: Negative for dizziness and tremors.  Psychiatric/Behavioral: Negative.     Psychiatric: Agitation: No Hallucination: No  Depressed Mood: No Insomnia: No Hypersomnia: No Altered Concentration: No Feels Worthless: No Grandiose Ideas: No Belief In Special Powers: No New/Increased Substance Abuse: No Compulsions: No  Neurologic: Headache: Yes Seizure: No Paresthesias: No  Medical history. Hypertension, morbid obesity, hyperlipidemia, sleep apnea and arthritis.  Her primary care physician is Dr. Jalene Mullet.    Outpatient Encounter Prescriptions as of 06/07/2015  Medication Sig  . acetaminophen (TYLENOL) 325 MG tablet Take 2 tablets (650 mg total) by mouth every 6 (six) hours as needed for pain. (Patient not taking: Reported on  08/11/2014)  . albuterol (PROVENTIL HFA;VENTOLIN HFA) 108 (90 BASE) MCG/ACT inhaler Inhale 2 puffs into the lungs every 6 (six) hours as needed. Inhale 2 puffs every 6 hrs prn.  Inhale 2 puffs every 4-6 hrs spaced 60 sec apart.  Marland Kitchen aspirin 81 MG chewable tablet Chew 81 mg by mouth daily.  Marland Kitchen BENICAR 20 MG tablet Take 20 mg by mouth daily.  . bumetanide (BUMEX) 1 MG tablet Take 1 tablet (1 mg total) by mouth daily.  . canagliflozin (INVOKANA) 300 MG TABS tablet Take 300 mg by mouth daily before breakfast.  . clonazePAM (KLONOPIN) 1 MG tablet TAKE 1 TABLET BY MOUTH NIGHTLY AT BEDTIME  . fluticasone (FLONASE) 50 MCG/ACT nasal spray Place 2 sprays into the nose daily.  . Insulin Detemir (LEVEMIR FLEXPEN) 100 UNIT/ML SOPN Inject 70 Units into the skin at bedtime.   . Insulin Glargine (TOUJEO SOLOSTAR [place]) Inject 85 Units into the skin at bedtime.  Marland Kitchen levothyroxine (SYNTHROID) 150 MCG tablet Take 150 mcg by mouth daily.  . metFORMIN (GLUCOPHAGE) 1000 MG tablet Take 1,000 mg by mouth 2 (two) times daily with a meal.  . PARoxetine (PAXIL) 40 MG tablet Take 1 tablet (40 mg total) by mouth every morning.  . pioglitazone (ACTOS) 45 MG tablet Take 45 mg by mouth daily.  . potassium chloride (K-DUR) 10 MEQ tablet Take 1 tablet (10 mEq total) by mouth daily.  . rosuvastatin (CRESTOR) 5 MG tablet Take 5 mg by mouth.  . traZODone (DESYREL) 100 MG tablet Take 2 tablets (200 mg total) by mouth at bedtime.  . ziprasidone (GEODON) 80 MG capsule Take 1 capsule (80  mg total) by mouth at bedtime.  . [DISCONTINUED] PARoxetine (PAXIL) 40 MG tablet Take 1 tablet (40 mg total) by mouth every morning.  . [DISCONTINUED] traZODone (DESYREL) 100 MG tablet Take 2 tablets (200 mg total) by mouth at bedtime.  . [DISCONTINUED] ziprasidone (GEODON) 80 MG capsule Take 1 capsule (80 mg total) by mouth at bedtime.   No facility-administered encounter medications on file as of 06/07/2015.    Past Psychiatric  History/Hospitalization(s): Patient has long history of psychiatric illness.  She was [facility] at [facility] and [facility]  There is no history of suicidal attempt however she has history of mania and psychosis.  She admitted talking to the picture and herself in the past.  She do not remember what medicine she had tried in the past.   Anxiety: Yes Bipolar Disorder: No Depression: Yes Mania: No Psychosis: Yes Schizophrenia: Yes Personality Disorder: No Hospitalization for psychiatric illness: Yes History of Electroconvulsive Shock Therapy: No Prior Suicide Attempts: No  Physical Exam: Constitutional:  BP 109/65 mmHg  Pulse 89  Ht 5\' 2"  (1.575 m)  Wt 261 lb 6.4 oz (118.57 kg)  BMI 47.80 kg/m2  General Appearance: obese and Maintained fair eye contact.  She is casually dressed and fairly groomed.  Musculoskeletal: Strength & Muscle Tone: Back pain Gait & Station: normal, slow to walk Patient leans: N/A  Mental status examination: Patient is a elderly obese female who is pleasant and cooperative.  She maintained fair eye contact.  Her speech is slow , soft but clear and coherent.  Her thought process is slow but logical.  She denies any auditory or visual hallucination.  Her attention and concentration is fair.  There were no delusions, paranoia or any obsessive thoughts.  She denies any active or passive suicidal thoughts or homicidal thought.  Her psychomotor activity is slow.  There were no tremors, shakes or any EPS.  Her fund of knowledge is fair.  Her cognition is grossly intact.  She's alert and oriented 3.  Her insight judgment and impulse control is okay.  Established Problem, Stable/Improving (1), Review of Last Therapy Session (1) and Review of Medication Regimen & Side Effects (2)  Assessment: Axis I: Schizophrenia chronic paranoid type, anxiety disorder NOS  Axis II: Deferred  Axis III: See medical history.  Plan:  Patient is stable on her medication.  She  is taking her medication and denies any side effects.  I will continue Geodon 80 mg at bedtime, Klonopin 1 mg at bedtime Paxil 40 mg daily and trazodone 200 mg at bedtime.  Discussed medication side effects and benefits.  Recommended to call us back if she has any question or any concern.  Follow-up in 3 months.   ARFEEN,SYED T., MD 06/07/2015

## 2015-06-12 DIAGNOSIS — R0602 Shortness of breath: Secondary | ICD-10-CM | POA: Diagnosis not present

## 2015-06-19 DIAGNOSIS — I10 Essential (primary) hypertension: Secondary | ICD-10-CM | POA: Diagnosis not present

## 2015-06-19 DIAGNOSIS — E662 Morbid (severe) obesity with alveolar hypoventilation: Secondary | ICD-10-CM | POA: Diagnosis not present

## 2015-06-19 DIAGNOSIS — R0602 Shortness of breath: Secondary | ICD-10-CM | POA: Diagnosis not present

## 2015-06-19 DIAGNOSIS — R0789 Other chest pain: Secondary | ICD-10-CM | POA: Diagnosis not present

## 2015-06-22 ENCOUNTER — Other Ambulatory Visit (HOSPITAL_COMMUNITY): Payer: Self-pay | Admitting: Psychiatry

## 2015-06-22 DIAGNOSIS — F200 Paranoid schizophrenia: Secondary | ICD-10-CM

## 2015-06-25 ENCOUNTER — Telehealth (HOSPITAL_COMMUNITY): Payer: Self-pay | Admitting: *Deleted

## 2015-06-25 NOTE — Telephone Encounter (Signed)
Pt called for a refill on klonopin 1mg . Rx for Klonopin was called into pharmacy on 06/25/15. Called and informed pt of prescription status.

## 2015-07-02 ENCOUNTER — Encounter: Payer: Self-pay | Admitting: Gastroenterology

## 2015-07-03 DIAGNOSIS — E119 Type 2 diabetes mellitus without complications: Secondary | ICD-10-CM | POA: Diagnosis not present

## 2015-07-03 DIAGNOSIS — E039 Hypothyroidism, unspecified: Secondary | ICD-10-CM | POA: Diagnosis not present

## 2015-07-05 DIAGNOSIS — E119 Type 2 diabetes mellitus without complications: Secondary | ICD-10-CM | POA: Diagnosis not present

## 2015-07-05 DIAGNOSIS — G4733 Obstructive sleep apnea (adult) (pediatric): Secondary | ICD-10-CM | POA: Diagnosis not present

## 2015-07-05 DIAGNOSIS — E039 Hypothyroidism, unspecified: Secondary | ICD-10-CM | POA: Diagnosis not present

## 2015-07-09 ENCOUNTER — Other Ambulatory Visit (HOSPITAL_COMMUNITY): Payer: Self-pay | Admitting: Psychiatry

## 2015-07-09 ENCOUNTER — Encounter: Payer: Self-pay | Admitting: Internal Medicine

## 2015-07-17 ENCOUNTER — Encounter: Payer: Self-pay | Admitting: Gastroenterology

## 2015-07-23 ENCOUNTER — Encounter: Payer: Self-pay | Admitting: Neurology

## 2015-07-23 ENCOUNTER — Ambulatory Visit (INDEPENDENT_AMBULATORY_CARE_PROVIDER_SITE_OTHER): Payer: Medicare Other | Admitting: Neurology

## 2015-07-23 VITALS — BP 114/72 | HR 78 | Resp 18 | Ht 62.0 in | Wt 260.0 lb

## 2015-07-23 DIAGNOSIS — R351 Nocturia: Secondary | ICD-10-CM | POA: Diagnosis not present

## 2015-07-23 DIAGNOSIS — G4733 Obstructive sleep apnea (adult) (pediatric): Secondary | ICD-10-CM | POA: Diagnosis not present

## 2015-07-23 DIAGNOSIS — R51 Headache: Secondary | ICD-10-CM

## 2015-07-23 DIAGNOSIS — G2581 Restless legs syndrome: Secondary | ICD-10-CM | POA: Diagnosis not present

## 2015-07-23 DIAGNOSIS — R519 Headache, unspecified: Secondary | ICD-10-CM

## 2015-07-23 NOTE — Patient Instructions (Signed)

## 2015-07-23 NOTE — Progress Notes (Signed)
Subjective:    Patient ID: Chelsea [name] is a 65 y.o. female.  HPI     Chelsea Age, MD, PhD [facility] [address]O. [place], [place] [address]  Dear Chelsea [name],   I saw your patient, Chelsea [name], upon your kind request in my neurologic clinic today for initial consultation of her sleep disorder, in particular, reevaluation of her prior diagnosis of OSA. The patient is unaccompanied by her sister, Chelsea [name], today. As you know, Chelsea [name] is a 65 year old right-handed woman with a complex medical history of hypothyroidism, hypertension, type 2 diabetes, hyperlipidemia, morbid obesity, mood disorder, including paranoid schizophrenia for which she is followed by mental health, Dr. Adele [name], Vitamin D deficiency, allergic rhinitis, osteoarthritis, who was previously diagnosed with obstructive sleep apnea several years ago, maybe 8-10 years ago, but is no longer using CPAP. She uses oxygen at night. Prior sleep study results are not available for my review today. I reviewed your office note from 07/05/2015, which you kindly included. She had blood work on 07/05/15 , which per sister, were okay. She also has had symptoms of restless legs and sometimes has to walk around.  She has nocturia, about 2-3 times a night. She has occasional morning headaches, about 3 times a week, does not usually take medication.  She does not watch TV in bed.  She goes to bed around 8 PM and takes several medications at night. Rise time is around 6:30 AM. She has a cat, that sleeps in her bed.  She is divorced, no children.  She had an upper respiratory infection about 4 months ago and was placed on oxygen at the time. She currently uses this at night, 2 L/m. She has an appointment with Dr. Halford [name] in pulmonology pending for June of this year. She has quit smoking many years ago, 1995. She quit drinking alcohol in 1990 and had a history of excessive alcohol use. Her father was an  alcoholic. She has 1 sister, Chelsea [name], and patient lives with her sister and sister's husband. She has no biological children. She is retired from housekeeping at the hospital. She has a high school education. She is divorced. She drinks caffeine in the form of coffee, 2 cups per day and occasional diet sodas. She moves a lot in her sleep. She would be willing to try CPAP again. Her main issue with CPAP was uncomfortable mask and pressure. She had a CPAP titration study on 07/01/2009 which I was able to review through your records: She had a titration up to 15 cm. Her AHI was reduced according to the report, sleep efficiency 97%, REM latency 209 minutes. She had no slow-wave sleep. A baseline sleep study report was not available for my review and was also not mentioned in results in the CPAP titration study which was interpreted by Dr. Brandon [name].  She had a tonsillectomy as a child. Her Epworth sleepiness score is 6 out of 24 today, her fatigue score is 60 out of 63. She sleeps with the head of bed elevated at 45 for perforans. She feels she can breathe better at night like that. She has a hospital bed.  Her Past Medical History Is Significant For: Past Medical History  Diagnosis Date  . Obesity   . Hyperlipemia   . Arthritis   . Diabetes mellitus type II   . Hypertension   . Heart murmur   . Mental disorder     Bipolar  . Anxiety   .  Shortness of breath   . Sleep apnea     uses CPAP  . COPD (chronic obstructive pulmonary disease) ([place])   . Hypothyroidism     Her Past Surgical History Is Significant For: Past Surgical History  Procedure Laterality Date  . Cholecystectomy    . Eye surgery    . Partial mastectomy with needle localization Left 05/10/2012    Procedure: PARTIAL MASTECTOMY WITH NEEDLE LOCALIZATION;  Surgeon: Chelsea Hector, MD;  Location: [facility];  Service: General;  Laterality: Left;    Her Family History Is Significant For: Family History  Problem Relation [name] of Onset  .  Alcohol abuse Father   . Stroke Father   . Alzheimer's disease Mother     Her Social History Is Significant For: Social History   Social History  . Marital Status: Divorced    Spouse Name: N/A  . Number of Children: 0  . Years of Education: HS   Occupational History  . Retired     Social History Main Topics  . Smoking status: Former Smoker -- 1.50 packs/day for 20 years    Types: Cigarettes    Quit date: 05/03/1993  . Smokeless tobacco: Never Used  . Alcohol Use: No  . Drug Use: No  . Sexual Activity: Not Currently   Other Topics Concern  . None   Social History Narrative   Drinks 2 cups of coffee a day    Her Allergies Are:  Allergies  Allergen Reactions  . Darvocet [Propoxyphene N-Acetaminophen] Anaphylaxis    Can take plain tylenol  . Fluoxetine Other (See Comments)    Hallucinations   . Penicillins     REACTION: rash  . Promethazine Hives  :   Her Current Medications Are:  Outpatient Encounter Prescriptions as of 07/23/2015  Medication Sig  . acetaminophen (TYLENOL) 325 MG tablet Take 2 tablets (650 mg total) by mouth every 6 (six) hours as needed for pain.  Marland Kitchen albuterol (PROVENTIL HFA;VENTOLIN HFA) 108 (90 BASE) MCG/ACT inhaler Inhale 2 puffs into the lungs every 6 (six) hours as needed. Inhale 2 puffs every 6 hrs prn.  Inhale 2 puffs every 4-6 hrs spaced 60 sec apart.  Marland Kitchen aspirin 81 MG chewable tablet Chew 81 mg by mouth daily.  Marland Kitchen BENICAR 20 MG tablet Take 20 mg by mouth daily.  . bumetanide (BUMEX) 1 MG tablet Take 1 tablet (1 mg total) by mouth daily.  . canagliflozin (INVOKANA) 300 MG TABS tablet Take 300 mg by mouth daily before breakfast.  . clonazePAM (KLONOPIN) 1 MG tablet Take 1 tablet by mouth nightly at bedtime  . fluticasone (FLONASE) 50 MCG/ACT nasal spray Place 2 sprays into the nose daily.  . Insulin Detemir (LEVEMIR FLEXPEN) 100 UNIT/ML SOPN Inject 70 Units into the skin at bedtime.   . Insulin Glargine (TOUJEO SOLOSTAR [place]) Inject 85 Units  into the skin at bedtime.  Marland Kitchen levothyroxine (SYNTHROID) 150 MCG tablet Take 150 mcg by mouth daily.  . metFORMIN (GLUCOPHAGE) 1000 MG tablet Take 1,000 mg by mouth 2 (two) times daily with a meal.  . PARoxetine (PAXIL) 40 MG tablet Take 1 tablet (40 mg total) by mouth every morning.  . pioglitazone (ACTOS) 45 MG tablet Take 45 mg by mouth daily.  . rosuvastatin (CRESTOR) 5 MG tablet Take 5 mg by mouth.  . traZODone (DESYREL) 100 MG tablet Take 2 tablets (200 mg total) by mouth at bedtime.  . ziprasidone (GEODON) 80 MG capsule Take 1 capsule (80 mg total) by  mouth at bedtime.  . [DISCONTINUED] potassium chloride (K-DUR) 10 MEQ tablet Take 1 tablet (10 mEq total) by mouth daily.   No facility-administered encounter medications on file as of 07/23/2015.  :   Review of Systems:  Out of a complete 14 point review of systems, all are reviewed and negative with the exception of these symptoms as listed below:   Review of Systems  Constitutional: Positive for fatigue.  Neurological: Positive for headaches.       Had a sleep study about 12 years ago. She was placed on CPAP but has not used in last 4 years. She is currently on oxygen at night.  Patient has trouble falling and staying asleep without medication, snoring, witnessed apnea, morning headaches, daytime tiredness, sometimes takes naps, restless legs.   Psychiatric/Behavioral:       Depression, anxiety, decreased appetite, disinterest in activities, racing thoughts.    Epworth Sleepiness Scale 0= would never doze 1= slight chance of dozing 2= moderate chance of dozing 3= high chance of dozing  Sitting and reading:2 Watching TV:2 Sitting inactive in a public place (ex. Theater or meeting):2 As a passenger in a car for an hour without a break:0 Lying down to rest in the afternoon:0 Sitting and talking to someone:0 Sitting quietly after lunch (no alcohol):0 In a car, while stopped in traffic:0 Total:6  Objective:  Neurologic  Exam  Physical Exam Physical Examination:   Filed Vitals:   07/23/15 1333  BP: 114/72  Pulse: 78  Resp: 18    General Examination: The patient is a very pleasant 65 y.o. female in no acute distress. She appears well-developed and well-nourished and adequately groomed.   HEENT: Normocephalic, atraumatic, pupils are equal, round and reactive to light and accommodation. Funduscopic exam is normal with sharp disc margins noted. Extraocular tracking is good without limitation to gaze excursion or nystagmus noted. Normal smooth pursuit is noted. Hearing is grossly intact. Tympanic membranes are clear bilaterally. Face is symmetric with normal facial animation and normal facial sensation. Speech is clear with no dysarthria noted. There is no hypophonia. There is no lip, neck/head, jaw or voice tremor. Neck is supple with full range of passive and active motion. There are no carotid bruits on auscultation. Oropharynx exam reveals: moderate mouth dryness, adequate dental hygiene and moderate airway crowding, due to smaller airway entry and redundant soft palate. Mallampati is class II. Tongue protrudes centrally and palate elevates symmetrically. Tonsils are absent. Neck size is 16 inches. She has a Mild overbite.  Chest: Clear to auscultation without wheezing, rhonchi or crackles noted.  Heart: S1+S2+0, regular and normal without murmurs, rubs or gallops noted.   Abdomen: Soft, non-tender and non-distended with normal bowel sounds appreciated on auscultation.  Extremities: There is no pitting edema in the distal lower extremities bilaterally. Pedal pulses are intact.  Skin: Warm and dry without trophic changes noted. There are no varicose veins.  Musculoskeletal: exam reveals no obvious joint deformities, tenderness or joint swelling or erythema, with the exception of bilateral mild knee swelling, she reports bilateral knee pain and left hip pain.   Neurologically:  Mental status: The patient is  awake, alert and oriented in all 4 spheres. Her immediate and remote memory, attention, language skills and fund of knowledge are appropriate. There is no evidence of aphasia, agnosia, apraxia or anomia. Speech is clear with normal prosody and enunciation. Thought process is linear. Mood is constricted and affect is blunted. There is mild psychomotor retardation. Cranial nerves II - XII  are as described above under HEENT exam. In addition: shoulder shrug is normal with equal shoulder height noted. Motor exam: Normal bulk, strength and tone is noted. There is no drift, tremor or rebound. Romberg is negative. Reflexes are 2+ throughout. Babinski: Toes are flexor bilaterally. Fine motor skills and coordination: intact with normal finger taps, normal hand movements, normal rapid alternating patting, normal foot taps and normal foot agility.  Cerebellar testing: No dysmetria or intention tremor on finger to nose testing. Heel to shin is unremarkable bilaterally. There is no truncal or gait ataxia.  Sensory exam: intact to light touch, pinprick, vibration, temperature sense in the UEs and LEs.  Gait, station and balance: She stands with difficulty. No veering to one side is noted. No leaning to one side is noted. Posture is [name]-appropriate and stance is narrow based. Gait shows slow and cautious gait, tandem walk is not possible.   Assessment and Plan:  In summary, Chelsea [name] is a very pleasant 65 y.o.-year old female with a history and physical exam concerning for obstructive sleep apnea (OSA). I had a long chat with the patient and Chelsea [name] about my findings and the diagnosis of OSA, its prognosis and treatment options. We talked about medical treatments, surgical interventions and non-pharmacological approaches. I explained in particular the risks and ramifications of untreated moderate to severe OSA, especially with respect to developing cardiovascular disease down the Road, including congestive heart  failure, difficult to treat hypertension, cardiac arrhythmias, or stroke. Even type 2 diabetes has, in part, been linked to untreated OSA. Symptoms of untreated OSA include daytime sleepiness, memory problems, mood irritability and mood disorder such as depression and anxiety, lack of energy, as well as recurrent headaches, especially morning headaches. We talked about trying to maintain a healthy lifestyle in general, as well as the importance of weight control. I encouraged the patient to eat healthy, exercise daily and keep well hydrated, to keep a scheduled bedtime and wake time routine, to not skip any meals and eat healthy snacks in between meals. I advised the patient not to drive when feeling sleepy. Of note, she is advised to bring all her nighttime medications. Of note, she sleeps with her head of bed elevated at 45 in order to breathe better. She sleeps in a hospital bed at home but does not require assistance at night. We talked about sleep hygiene as well, she is discouraged and that her cat sleep in her bed. I recommended the following at this time: sleep study with potential positive airway pressure titration. (We will score hypopneas at 4% and split the sleep study into diagnostic and treatment portion, if the estimated. 2 hour AHI is >15/h).   I explained the sleep test procedure to the patient and also outlined possible surgical and non-surgical treatment options of OSA, including the use of a custom-made dental device (which would require a referral to a specialist dentist or oral surgeon), upper airway surgical options, such as pillar implants, radiofrequency surgery, tongue base surgery, and UPPP (which would involve a referral to an ENT surgeon). Rarely, jaw surgery such as mandibular advancement may be considered.  I also explained the CPAP treatment option to the patient, who indicated that she would be willing to try CPAP if the need arises. I explained the importance of being compliant  with PAP treatment, not only for insurance purposes but primarily to improve Her symptoms, and for the patient's long term health benefit, including to reduce Her cardiovascular risks. I answered all  her questions today and the patient and Chelsea [name] were in agreement. I would like to see her back after the sleep study is completed and encouraged her to call with any interim questions, concerns, problems or updates.   Thank you very much for allowing me to participate in the care of this nice patient. If I can be of any further assistance to you please do not hesitate to call me at [phone].  Sincerely,   Chelsea Age, MD, PhD

## 2015-07-24 ENCOUNTER — Telehealth (HOSPITAL_COMMUNITY): Payer: Self-pay

## 2015-07-24 DIAGNOSIS — F200 Paranoid schizophrenia: Secondary | ICD-10-CM

## 2015-07-24 MED ORDER — CLONAZEPAM 1 MG PO TABS
1.0000 mg | ORAL_TABLET | Freq: Every day | ORAL | Status: DC
Start: 2015-07-24 — End: 2015-08-23

## 2015-07-24 NOTE — Telephone Encounter (Signed)
Per Dr. Adele Schilder, this was called into the pharmacy and patient was called to let her know

## 2015-07-24 NOTE — Telephone Encounter (Signed)
Patient calling for refill on Klonopin, last fill was 3/27 for one month supply. Patient has a follow up on 6/13, okay to refill? Please review and advise, thank you

## 2015-08-10 ENCOUNTER — Other Ambulatory Visit (HOSPITAL_COMMUNITY): Payer: Self-pay | Admitting: Psychiatry

## 2015-08-12 ENCOUNTER — Ambulatory Visit (INDEPENDENT_AMBULATORY_CARE_PROVIDER_SITE_OTHER): Payer: Medicare Other | Admitting: Neurology

## 2015-08-12 DIAGNOSIS — G4761 Periodic limb movement disorder: Secondary | ICD-10-CM

## 2015-08-12 DIAGNOSIS — G4733 Obstructive sleep apnea (adult) (pediatric): Secondary | ICD-10-CM | POA: Diagnosis not present

## 2015-08-12 DIAGNOSIS — G4720 Circadian rhythm sleep disorder, unspecified type: Secondary | ICD-10-CM

## 2015-08-12 DIAGNOSIS — G479 Sleep disorder, unspecified: Secondary | ICD-10-CM

## 2015-08-12 DIAGNOSIS — G4734 Idiopathic sleep related nonobstructive alveolar hypoventilation: Secondary | ICD-10-CM

## 2015-08-13 DIAGNOSIS — N183 Chronic kidney disease, stage 3 (moderate): Secondary | ICD-10-CM | POA: Diagnosis not present

## 2015-08-13 DIAGNOSIS — I129 Hypertensive chronic kidney disease with stage 1 through stage 4 chronic kidney disease, or unspecified chronic kidney disease: Secondary | ICD-10-CM | POA: Diagnosis not present

## 2015-08-13 DIAGNOSIS — N390 Urinary tract infection, site not specified: Secondary | ICD-10-CM | POA: Diagnosis not present

## 2015-08-15 DIAGNOSIS — H52223 Regular astigmatism, bilateral: Secondary | ICD-10-CM | POA: Diagnosis not present

## 2015-08-15 DIAGNOSIS — H25813 Combined forms of age-related cataract, bilateral: Secondary | ICD-10-CM | POA: Diagnosis not present

## 2015-08-15 DIAGNOSIS — H353131 Nonexudative age-related macular degeneration, bilateral, early dry stage: Secondary | ICD-10-CM | POA: Diagnosis not present

## 2015-08-15 DIAGNOSIS — H5000 Unspecified esotropia: Secondary | ICD-10-CM | POA: Diagnosis not present

## 2015-08-15 DIAGNOSIS — E119 Type 2 diabetes mellitus without complications: Secondary | ICD-10-CM | POA: Diagnosis not present

## 2015-08-15 DIAGNOSIS — H5203 Hypermetropia, bilateral: Secondary | ICD-10-CM | POA: Diagnosis not present

## 2015-08-15 DIAGNOSIS — H524 Presbyopia: Secondary | ICD-10-CM | POA: Diagnosis not present

## 2015-08-16 ENCOUNTER — Telehealth: Payer: Self-pay | Admitting: Neurology

## 2015-08-16 NOTE — Telephone Encounter (Signed)
Patient referred by Dr. Ernie Hew, seen by me on 07/23/15, diagnostic PSG on 08/12/15.   Please call and notify the patient that the recent sleep study did not show any significant obstructive sleep apnea; she did indeed have low oxygen saturations throughout the night and while CPAP is not warranted, she should be using her oxygen at night faithfully. Please inform patient that I would like to go over the details of the study during a follow up appointment. Arrange a followup appointment. Also, route or fax report to PCP and referring MD, if other than PCP.  Once you have spoken to patient, you can close this encounter.   Thanks,  Star Age, MD, PhD [facility])

## 2015-08-20 NOTE — Telephone Encounter (Signed)
I spoke to patient and she is aware of results and recommendations. She voiced understanding. Unable to make appt at this time due to not having her calendar with her. She states that she will call back to make appt.

## 2015-08-22 ENCOUNTER — Other Ambulatory Visit (HOSPITAL_COMMUNITY): Payer: Self-pay | Admitting: Psychiatry

## 2015-08-23 ENCOUNTER — Telehealth (HOSPITAL_COMMUNITY): Payer: Self-pay

## 2015-08-23 DIAGNOSIS — F200 Paranoid schizophrenia: Secondary | ICD-10-CM

## 2015-08-23 MED ORDER — CLONAZEPAM 1 MG PO TABS: 1.0000 mg | ORAL_TABLET | Freq: Every day | ORAL | Status: DC

## 2015-08-23 NOTE — Telephone Encounter (Signed)
Called in prescription to the pharmacy.

## 2015-08-23 NOTE — Telephone Encounter (Signed)
Patient is calling for a refill on Klonopin, last received on 4/25 for 30 days. Patient was last here on 3/9 and has a follow up on 6/13. Please review and advise, thank you

## 2015-08-23 NOTE — Telephone Encounter (Signed)
Ok to refill 

## 2015-08-24 ENCOUNTER — Other Ambulatory Visit (HOSPITAL_COMMUNITY): Payer: Self-pay | Admitting: Psychiatry

## 2015-08-24 ENCOUNTER — Ambulatory Visit: Payer: Self-pay | Admitting: Gastroenterology

## 2015-08-24 ENCOUNTER — Encounter: Payer: Self-pay | Admitting: Family Medicine

## 2015-08-29 DIAGNOSIS — N390 Urinary tract infection, site not specified: Secondary | ICD-10-CM | POA: Diagnosis not present

## 2015-08-29 IMAGING — CR DG CHEST 2V
2 series · 2 of 2 positions shown · non-contrast
Comparison: 05/03/2012

CLINICAL DATA: Chest pain and short of breath

CHEST - 2 VIEW

[w chest pa]
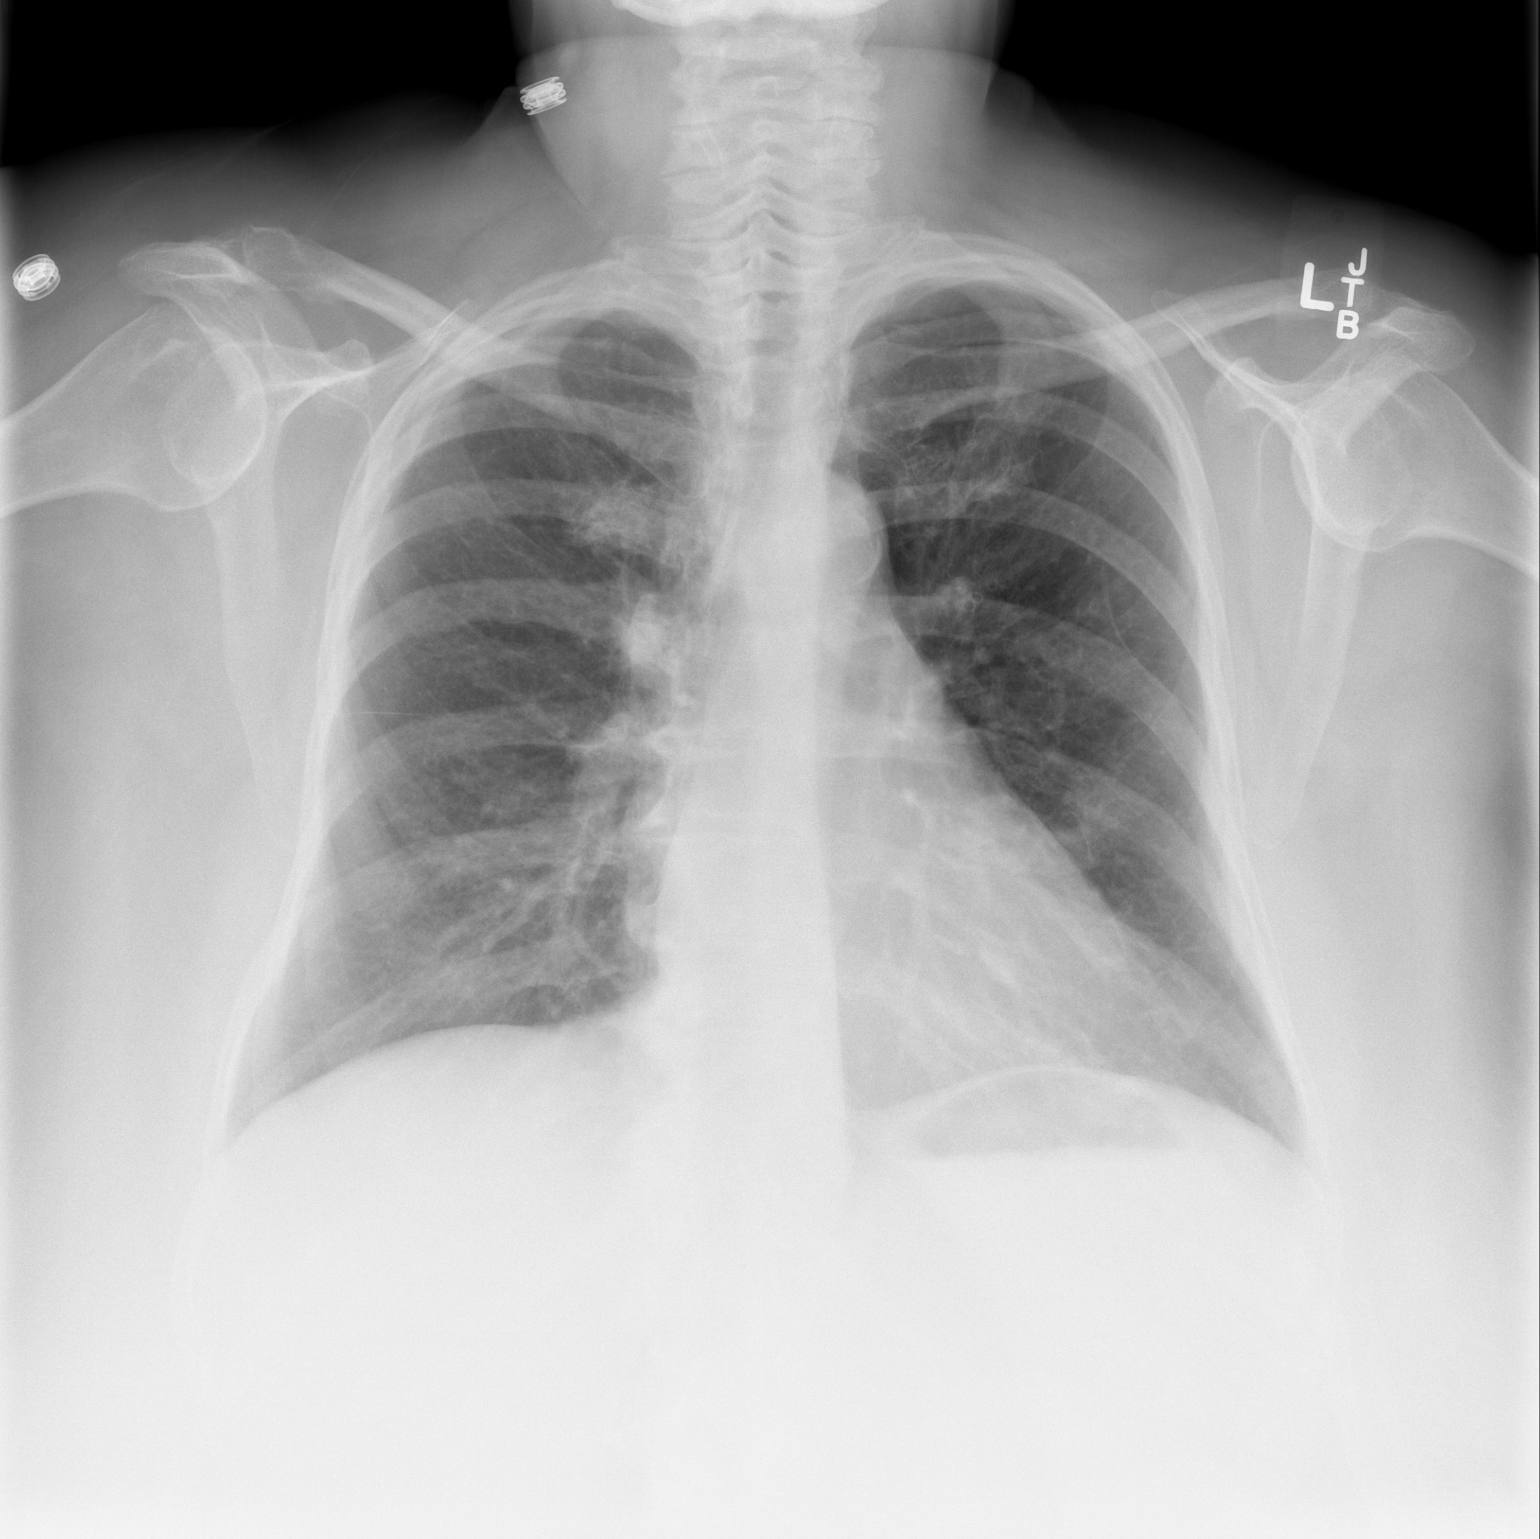

[w chest lat]
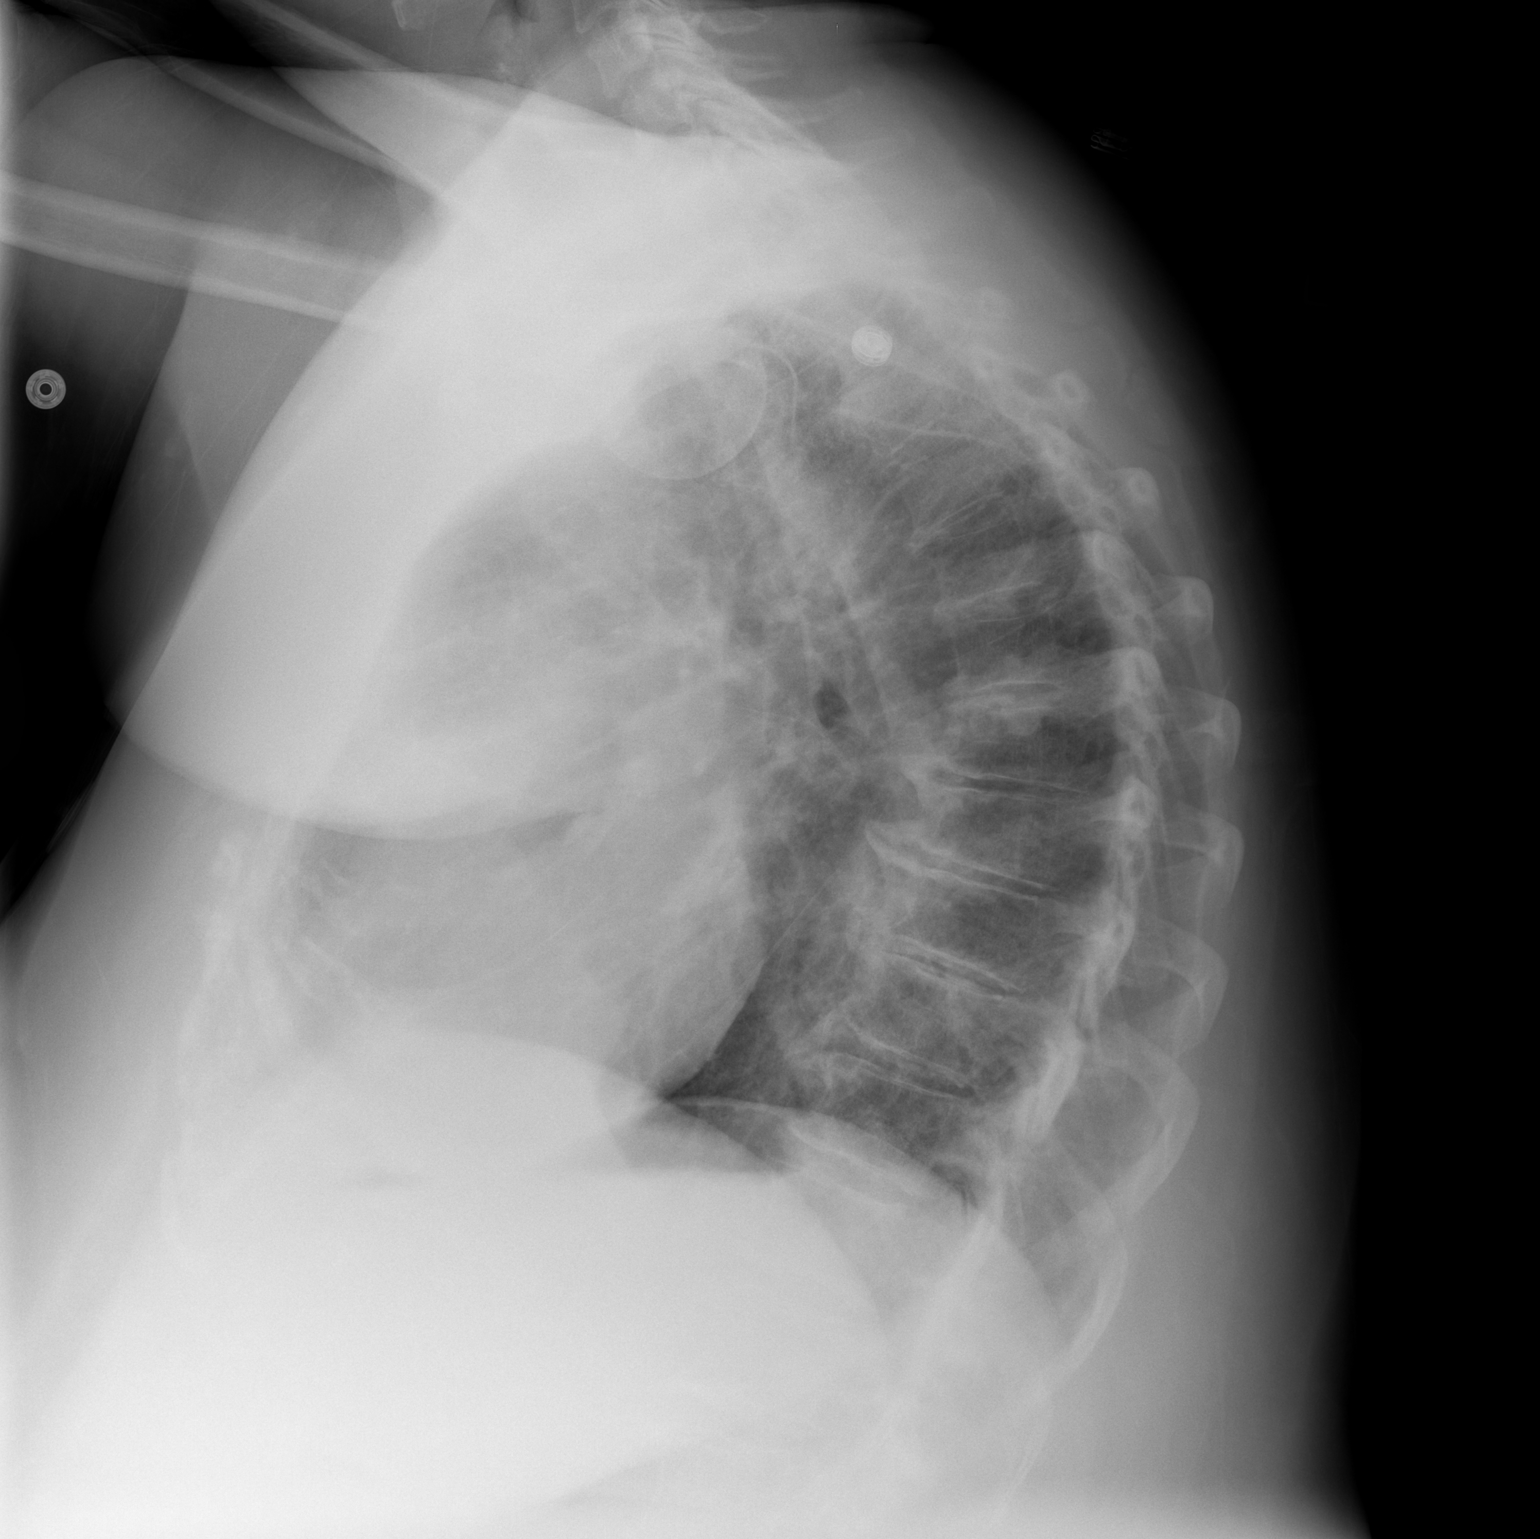

[2 of 2 positions shown; findings below may reference images not displayed]

FINDINGS: Heart size upper normal.  Negative for heart failure.
Lungs are free of infiltrate effusion or mass.
IMPRESSION: No active cardiopulmonary abnormality.

## 2015-08-29 IMAGING — CT CT ANGIO CHEST
2 of 9 series · 18 of 46 positions shown · IV contrast (APPLIED)
Comparison: Multiple exams, including 12/03/2012 and 06/02/2012

CLINICAL DATA: Shortness of breath. Chest pain. Diabetes. COPD.
Cardiac murmur.

EXAM:
CT ANGIOGRAPHY CHEST WITH CONTRAST
TECHNIQUE: Multidetector CT imaging of the chest was performed using the
standard protocol during bolus administration of intravenous
contrast. Multiplanar CT image reconstructions including MIPs were
obtained to evaluate the vascular anatomy.
CONTRAST:  100mL OMNIPAQUE IOHEXOL 350 MG/ML SOLN

[Series 5: thins · axial · 0.73mm/px · z∈[+990,+1202]mm · 15 of 240 slices shown]
[im 14/240  lung]
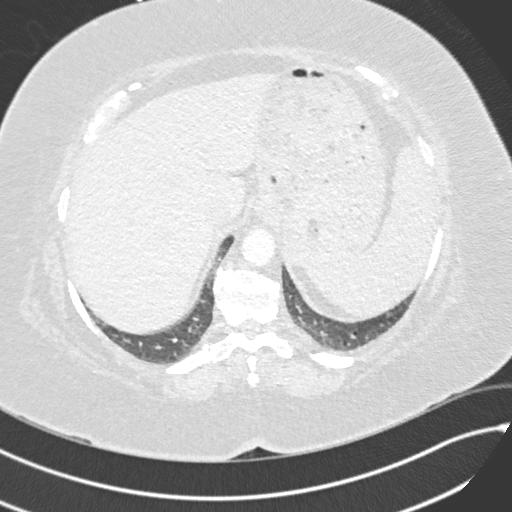
[im 27/240  soft-tissue]
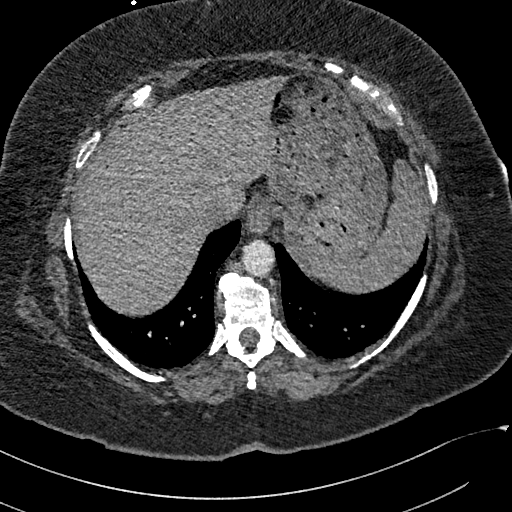
[im 40/240  lung]
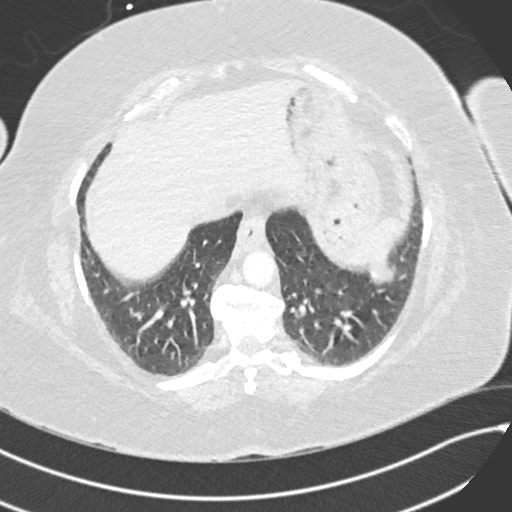
[im 54/240  soft-tissue]
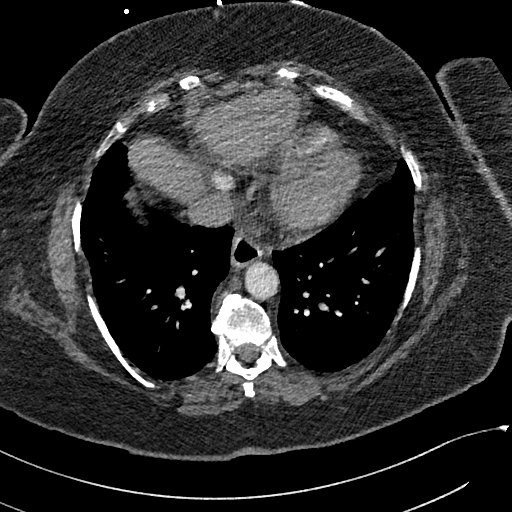
[im 80/240  lung]
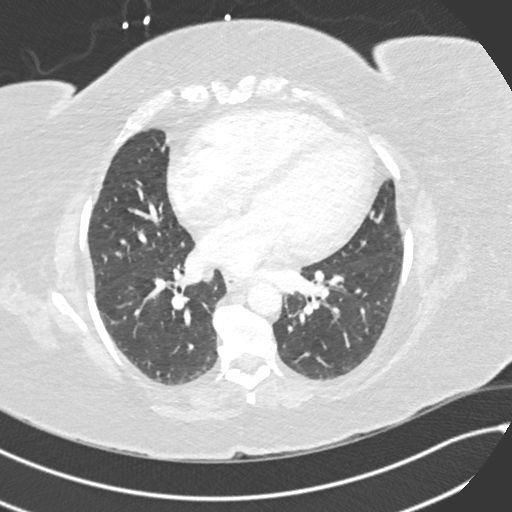
[im 93/240  soft-tissue]
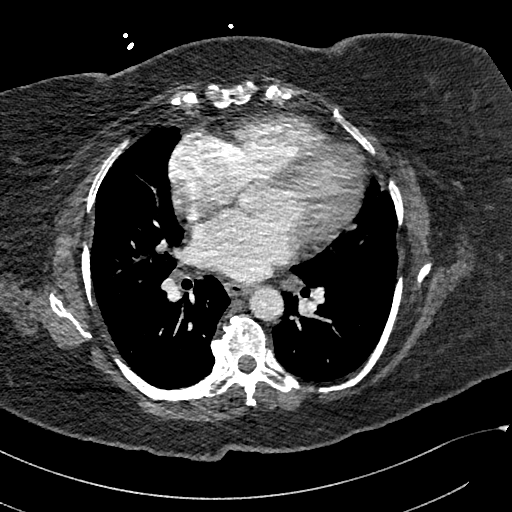
[im 107/240  lung]
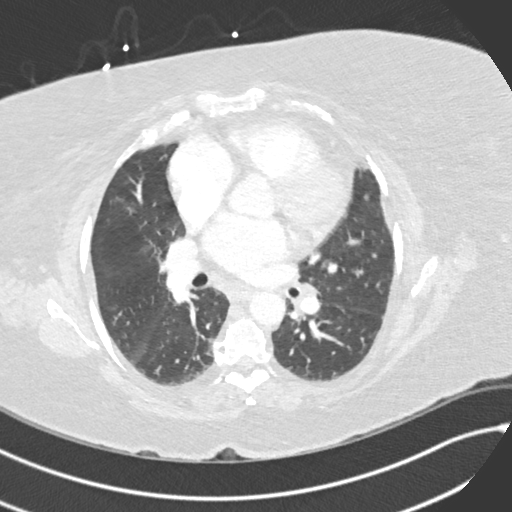
[im 120/240  soft-tissue]
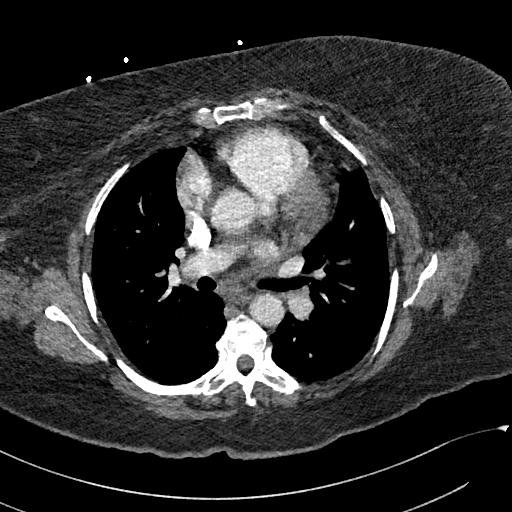
[im 133/240  lung]
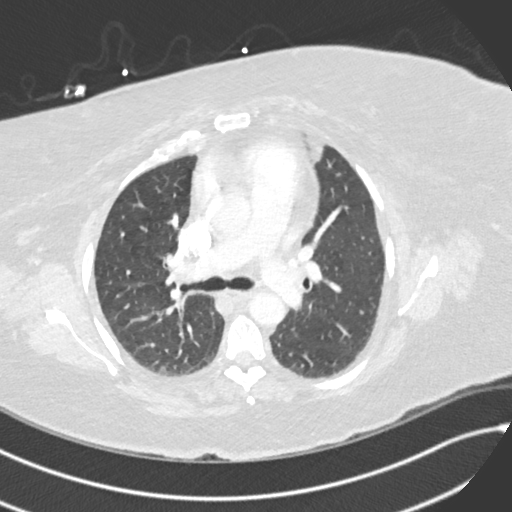
[im 147/240  soft-tissue]
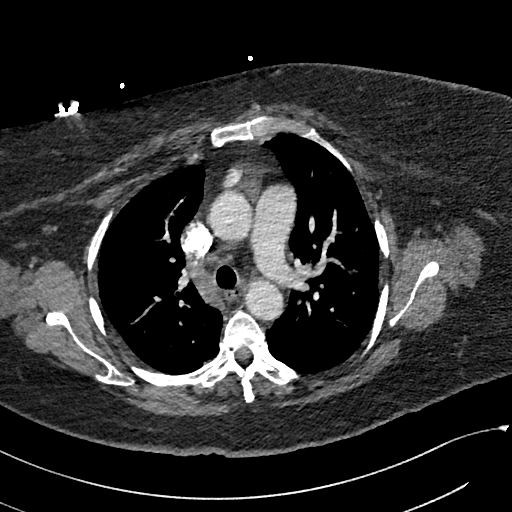
[im 160/240  lung]
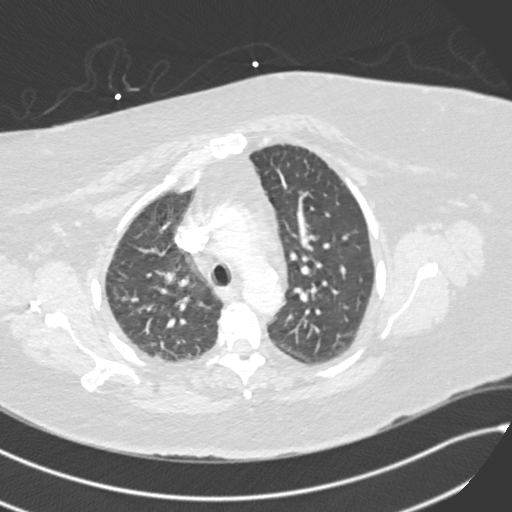
[im 186/240  soft-tissue]
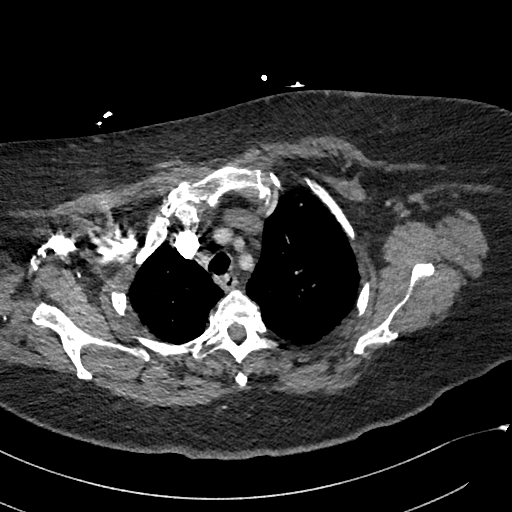
[im 200/240  lung]
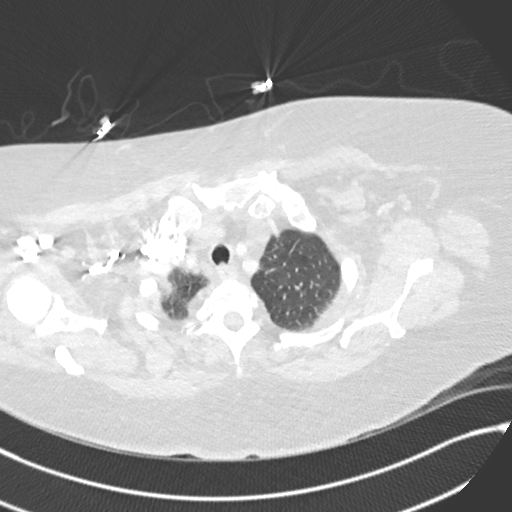
[im 213/240  soft-tissue]
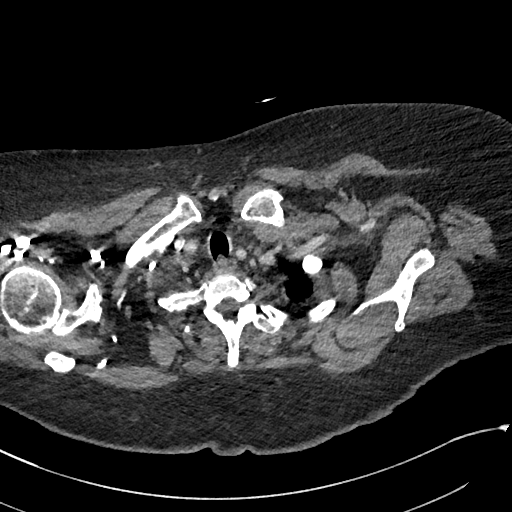
[im 226/240  lung]
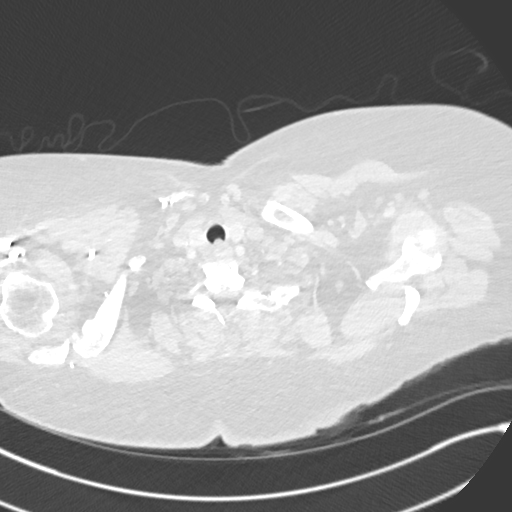

[Series 7: coronal mpr · coronal · 0.49mm/px · 3 of 129 slices shown]
[im 33/129  soft-tissue]
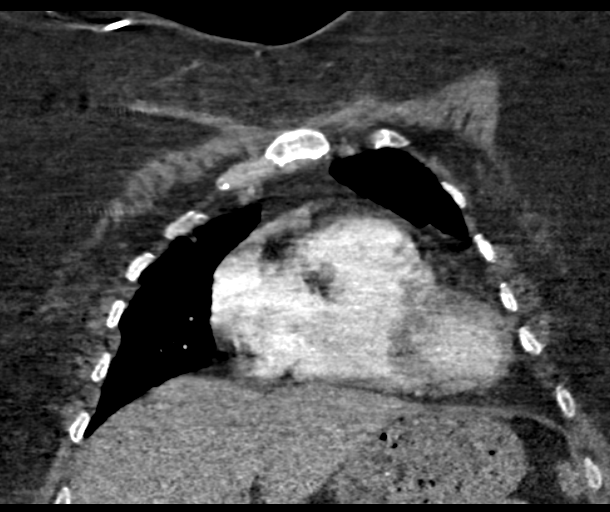
[im 65/129  soft-tissue]
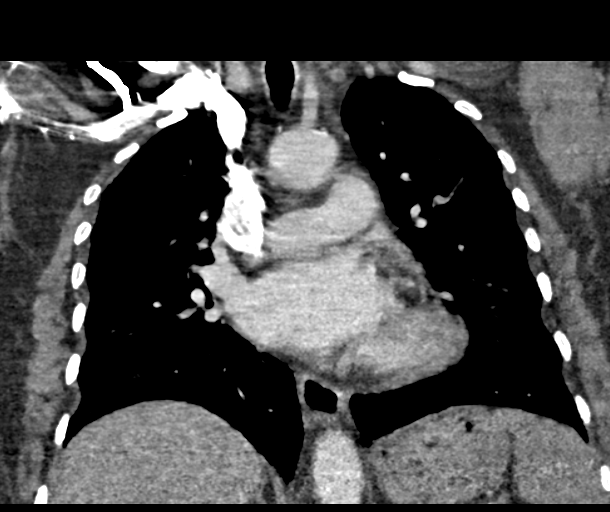
[im 97/129  soft-tissue]
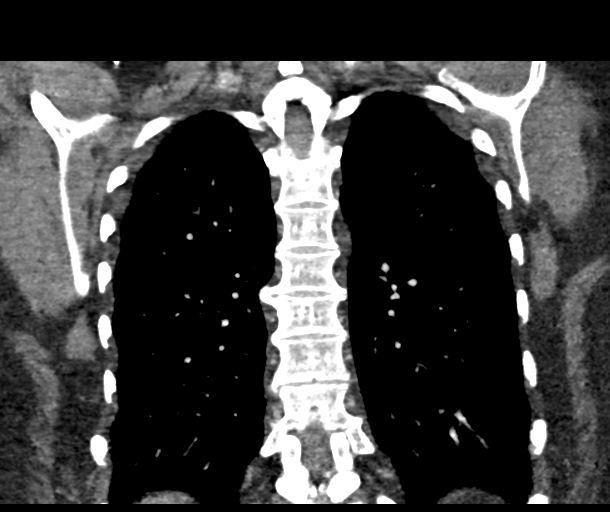

[18 of 46 positions shown; findings below may reference images not displayed]

FINDINGS: Reduced sensitivity and specificity due to patient body habitus and
timing issues. The pulmonary arterial bolus is not very
concentrated. Motion artifact also contributes to blurring and
reduced density in the pulmonary arterial tree in some locations.

Low-density in a left upper lobe pulmonary arterial branches shown
on image 93 of series 5, and could represent pulmonary embolus.
Given how patchy the density in the pulmonary arteries as, this is
somewhat uncertain.

Scattered small prevascular lymph nodes are again observed. Right
upper paratracheal node short axis 1.1 cm, previously 1.3 cm. Left
infrahilar lymph node 1.2 cm in short axis, previously 1.3 cm.

Emphysema noted. There is scarring anteriorly in the right upper
lobe just above the minor fissure. Small at eventration of the left
hemidiaphragm. Previous density posteriorly in the right upper lobe
has resolved. Previous density posteriorly in the left lower lobe
has resolved.

Review of the MIP images confirms the above findings.
IMPRESSION: 1. Heterogeneity in a left upper lobe pulmonary artery suspicious
for pulmonary embolus. Admittedly the dilute pulmonary arterial
contrast column (similar to prior exam attempts) and patient body
habitus significantly adversely affect positive and negative
predictive value of this exam. This may be a case where ventilation
perfusion lung scan and lower extremity Doppler are helpful in
providing correlating imaging.
2. Mild chronic mediastinal and left infrahilar adenopathy.
3. Emphysema.
4. Resolution of the prior airspace opacities seen in 1466.

## 2015-09-03 DIAGNOSIS — Z79899 Other long term (current) drug therapy: Secondary | ICD-10-CM | POA: Diagnosis not present

## 2015-09-03 DIAGNOSIS — I10 Essential (primary) hypertension: Secondary | ICD-10-CM | POA: Diagnosis not present

## 2015-09-03 DIAGNOSIS — E559 Vitamin D deficiency, unspecified: Secondary | ICD-10-CM | POA: Diagnosis not present

## 2015-09-05 DIAGNOSIS — R0602 Shortness of breath: Secondary | ICD-10-CM | POA: Diagnosis not present

## 2015-09-05 DIAGNOSIS — E559 Vitamin D deficiency, unspecified: Secondary | ICD-10-CM | POA: Diagnosis not present

## 2015-09-10 ENCOUNTER — Ambulatory Visit (HOSPITAL_COMMUNITY): Payer: Self-pay | Admitting: Psychiatry

## 2015-09-11 ENCOUNTER — Ambulatory Visit (INDEPENDENT_AMBULATORY_CARE_PROVIDER_SITE_OTHER): Payer: Medicare Other | Admitting: Psychiatry

## 2015-09-11 ENCOUNTER — Encounter (HOSPITAL_COMMUNITY): Payer: Self-pay | Admitting: Psychiatry

## 2015-09-11 ENCOUNTER — Other Ambulatory Visit (HOSPITAL_COMMUNITY): Payer: Self-pay | Admitting: Psychiatry

## 2015-09-11 VITALS — BP 130/76 | HR 82 | Ht 62.0 in | Wt 257.6 lb

## 2015-09-11 DIAGNOSIS — F200 Paranoid schizophrenia: Secondary | ICD-10-CM

## 2015-09-11 DIAGNOSIS — F419 Anxiety disorder, unspecified: Secondary | ICD-10-CM

## 2015-09-11 MED ORDER — CLONAZEPAM 1 MG PO TABS: 1.0000 mg | ORAL_TABLET | Freq: Every day | ORAL | Status: DC

## 2015-09-11 MED ORDER — TRAZODONE HCL 100 MG PO TABS: 200.0000 mg | ORAL_TABLET | Freq: Every day | ORAL | Status: DC

## 2015-09-11 MED ORDER — PAROXETINE HCL 40 MG PO TABS: 40.0000 mg | ORAL_TABLET | Freq: Every morning | ORAL | Status: DC

## 2015-09-11 MED ORDER — ZIPRASIDONE HCL 80 MG PO CAPS: 80.0000 mg | ORAL_CAPSULE | Freq: Every day | ORAL | Status: DC

## 2015-09-11 NOTE — Progress Notes (Signed)
[place] [phone] Progress Note  Chelsea [name] [id number] 65 y.o.  09/11/2015 3:58 PM  Chief Complaint: Medication management and followup     History of Present Illness: Chelsea [name] came for her followup appointment.  She endorse taking the medication as prescribed.  She is taking Paxil, Geodon and Klonopin and trazodone.  She has no side effects.  She sleeping good.  She denies any paranoia, hallucination or any anger issues.  Her energy level is fair.  Recently she's seen neurologist for sleep study and she was told that she has low oxygen level and recommended take oxygen at night.  Patient reported no tremors, shakes or any EPS.  She has chronic health issues and she is going to her primary care physician for her medical needs.  Patient lives by herself and she has no children.  She denies drinking or using any illegal substances.  Her appetite is okay.  Her vital signs are stable.  She wants to continue her current psychiatric medication.  She is not interested in counseling.    Suicidal Ideation: No Plan Formed: No Patient has means to carry out plan: No  Homicidal Ideation: No Plan Formed: No Patient has means to carry out plan: No  Review of Systems  Constitutional: Negative.   Cardiovascular: Negative for chest pain and palpitations.  Musculoskeletal: Positive for back pain.  Neurological: Negative for dizziness and tremors.  Psychiatric/Behavioral: Negative.     Psychiatric: Agitation: No Hallucination: No  Depressed Mood: No Insomnia: No Hypersomnia: No Altered Concentration: No Feels Worthless: No Grandiose Ideas: No Belief In Special Powers: No New/Increased Substance Abuse: No Compulsions: No  Neurologic: Headache: Yes Seizure: No Paresthesias: No  Medical history. Hypertension, morbid obesity, hyperlipidemia, sleep apnea and arthritis.  Her primary care physician is Dr. Jalene Mullet.    Outpatient Encounter Prescriptions as of 09/11/2015   Medication Sig  . acetaminophen (TYLENOL) 325 MG tablet Take 2 tablets (650 mg total) by mouth every 6 (six) hours as needed for pain.  Marland Kitchen albuterol (PROVENTIL HFA;VENTOLIN HFA) 108 (90 BASE) MCG/ACT inhaler Inhale 2 puffs into the lungs every 6 (six) hours as needed. Inhale 2 puffs every 6 hrs prn.  Inhale 2 puffs every 4-6 hrs spaced 60 sec apart.  Marland Kitchen aspirin 81 MG chewable tablet Chew 81 mg by mouth daily.  Marland Kitchen BENICAR 20 MG tablet Take 20 mg by mouth daily.  . bumetanide (BUMEX) 1 MG tablet Take 1 tablet (1 mg total) by mouth daily.  . canagliflozin (INVOKANA) 300 MG TABS tablet Take 300 mg by mouth daily before breakfast.  . clonazePAM (KLONOPIN) 1 MG tablet Take 1 tablet (1 mg total) by mouth at bedtime.  . fluticasone (FLONASE) 50 MCG/ACT nasal spray Place 2 sprays into the nose daily.  . Insulin Detemir (LEVEMIR FLEXPEN) 100 UNIT/ML SOPN Inject 70 Units into the skin at bedtime.   . Insulin Glargine (TOUJEO SOLOSTAR [place]) Inject 85 Units into the skin at bedtime.  Marland Kitchen levothyroxine (SYNTHROID) 150 MCG tablet Take 150 mcg by mouth daily.  . metFORMIN (GLUCOPHAGE) 1000 MG tablet Take 1,000 mg by mouth 2 (two) times daily with a meal.  . PARoxetine (PAXIL) 40 MG tablet Take 1 tablet (40 mg total) by mouth every morning.  . pioglitazone (ACTOS) 45 MG tablet Take 45 mg by mouth daily.  . traZODone (DESYREL) 100 MG tablet Take 2 tablets (200 mg total) by mouth at bedtime.  . ziprasidone (GEODON) 80 MG capsule Take 1 capsule (80 mg total)  by mouth at bedtime.  . [DISCONTINUED] clonazePAM (KLONOPIN) 1 MG tablet Take 1 tablet (1 mg total) by mouth at bedtime.  . [DISCONTINUED] PARoxetine (PAXIL) 40 MG tablet Take 1 tablet (40 mg total) by mouth every morning.  . [DISCONTINUED] rosuvastatin (CRESTOR) 5 MG tablet Take 5 mg by mouth.  . [DISCONTINUED] traZODone (DESYREL) 100 MG tablet Take 2 tablets (200 mg total) by mouth at bedtime.  . [DISCONTINUED] ziprasidone (GEODON) 80 MG capsule Take 1 capsule (80  mg total) by mouth at bedtime.   No facility-administered encounter medications on file as of 09/11/2015.    Past Psychiatric History/Hospitalization(s): Patient has long history of psychiatric illness.  She was admitted at [facility] and [facility].  There is no history of suicidal attempt however she has history of mania and psychosis.  She admitted talking to the picture and herself in the past.  She do not remember what medicine she had tried in the past.   Anxiety: Yes Bipolar Disorder: No Depression: Yes Mania: No Psychosis: Yes Schizophrenia: Yes Personality Disorder: No Hospitalization for psychiatric illness: Yes History of Electroconvulsive Shock Therapy: No Prior Suicide Attempts: No  Physical Exam: Constitutional:  BP 130/76 mmHg  Pulse 82  Ht 5\' 2"  (1.575 m)  Wt 257 lb 9.6 oz (116.847 kg)  BMI 47.10 kg/m2  General Appearance: obese and Maintained fair eye contact.  She is casually dressed and fairly groomed.  Musculoskeletal: Strength & Muscle Tone: Back pain Gait & Station: normal, slow to walk Patient leans: N/A  Mental status examination: Patient is a obese female who is pleasant and cooperative.  She is casually dressed.  She maintained fair eye contact.  Her speech is slow , soft but clear and coherent.  Her thought process is slow but logical.  She denies any auditory or visual hallucination.  Her attention and concentration is fair.  There were no delusions, paranoia or any obsessive thoughts.  She denies any active or passive suicidal thoughts or homicidal thought.  Her psychomotor activity is slow.  There were no tremors, shakes or any EPS.  Her fund of knowledge is fair.  Her cognition is grossly intact.  She's alert and oriented 3.  Her insight judgment and impulse control is okay.  Established Problem, Stable/Improving (1), Review of Last Therapy Session (1) and Review of Medication Regimen & Side Effects (2)  Assessment: Axis I: Schizophrenia  chronic paranoid type, anxiety disorder NOS  Axis II: Deferred  Axis III: See medical history.  Plan:  Patient is stable on her medication.  She has no side effects.  I will continue Geodon 80 mg at bedtime, Klonopin 1 mg at bedtime Paxil 40 mg daily and trazodone 200 mg at bedtime.  Discussed medication side effects and benefits.  Recommended to call us back if she has any question or any concern.  Follow-up in 3 months.   Miamarie Moll T., MD 09/11/2015

## 2015-09-12 ENCOUNTER — Ambulatory Visit (INDEPENDENT_AMBULATORY_CARE_PROVIDER_SITE_OTHER): Payer: Medicare Other | Admitting: Neurology

## 2015-09-12 ENCOUNTER — Encounter: Payer: Self-pay | Admitting: Neurology

## 2015-09-12 VITALS — BP 114/82 | HR 72 | Resp 16 | Ht 62.0 in | Wt 257.0 lb

## 2015-09-12 DIAGNOSIS — G4761 Periodic limb movement disorder: Secondary | ICD-10-CM

## 2015-09-12 DIAGNOSIS — G4733 Obstructive sleep apnea (adult) (pediatric): Secondary | ICD-10-CM

## 2015-09-12 DIAGNOSIS — G2581 Restless legs syndrome: Secondary | ICD-10-CM

## 2015-09-12 DIAGNOSIS — R0902 Hypoxemia: Secondary | ICD-10-CM

## 2015-09-12 DIAGNOSIS — G4734 Idiopathic sleep related nonobstructive alveolar hypoventilation: Secondary | ICD-10-CM

## 2015-09-12 NOTE — Patient Instructions (Signed)
Your recent sleep study did not suggest any significant obstructive sleep apnea. You should continue with your nighttime oxygen. Please monitor your restless legs symptoms, we can revisit using medication for restless legs and/or leg twitching at night at our next visit. Please continue to strive for weight loss.

## 2015-09-12 NOTE — Progress Notes (Signed)
Subjective:    Patient ID: Chelsea [name] is a 65 y.o. female.  HPI     Interim history:   Chelsea [name] is a 65 year old right-handed woman with a complex medical history of hypothyroidism, hypertension, type 2 diabetes, hyperlipidemia, morbid obesity, mood disorder, including paranoid schizophrenia for which she is followed by mental health, Dr. Adele [name], Vitamin D deficiency, allergic rhinitis, osteoarthritis, who presents for follow-up consultation of her sleep disturbance, after her recent sleep study. The patient is accompanied by her sister today. I first met her on 07/23/2015 at the request of her primary care physician, at which time patient reported a prior diagnosis of obstructive sleep apnea, on 8-10 years ago but she was no longer on CPAP therapy and was using oxygen at night. I invited her back for sleep study. She had a diagnostic sleep study on 08/12/2015. I went over her test results with her in detail today. Sleep efficiency was reduced at 82.7% with a latency to sleep of 28 minutes and wake after sleep onset of 57 minutes with mild to moderate sleep fragmentation noted. She had an elevated arousal index. She had an increased percentage of light stage sleep, absence of slow-wave sleep and a decreased percentage of REM sleep with a prolonged REM latency. She had moderate PLMS with an index of 34 per hour, resulting in 8 arousals per hour. She hadn't moderate snoring. AHI was 3.1 per hour in total, rising to 20 per hour during REM sleep. Average oxygen desaturation was 87%, nadir was 77%. Time below 90% saturation was 6 hours and 42 minutes, time below 88% saturation was 6 hours and 22 minutes. Study was conducted without supplemental oxygen, she uses oxygen at home. She was advised to continue with her home oxygen therapy.  Today, 09/12/2015: She reports No new problems. She does report some restless leg symptoms and difficulty finding a comfortable position at night to sleep, legs to twitch  at night and sometimes that bothers her, this is not a consistent problem and not severe at this time. She does use oxygen at night, 2 L/m via nasal cannula. She is trying to lose weight, has lost a few pounds.  Previously:   07/23/2015: She was previously diagnosed with obstructive sleep apnea several years ago, maybe 8-10 years ago, but is no longer using CPAP. She uses oxygen at night. Prior sleep study results are not available for my review today. I reviewed your office note from 07/05/2015, which you kindly included. She had blood work on 07/05/15 , which per sister, were okay. She also has had symptoms of restless legs and sometimes has to walk around.   She has nocturia, about 2-3 times a night. She has occasional morning headaches, about 3 times a week, does not usually take medication.   She does not watch TV in bed.   She goes to bed around 8 PM and takes several medications at night. Rise time is around 6:30 AM. She has a cat, that sleeps in her bed.   She is divorced, no children.   She had an upper respiratory infection about 4 months ago and was placed on oxygen at the time. She currently uses this at night, 2 L/m. She has an appointment with Dr. Halford [name] in pulmonology pending for June of this year. She has quit smoking many years ago, 1995. She quit drinking alcohol in 1990 and had a history of excessive alcohol use. Her father was an alcoholic. She has 1 sister, Chelsea [name], and patient  lives with her sister and sister's husband. She has no biological children. She is retired from housekeeping at the hospital. She has a high school education. She is divorced. She drinks caffeine in the form of coffee, 2 cups per day and occasional diet sodas. She moves a lot in her sleep. She would be willing to try CPAP again. Her main issue with CPAP was uncomfortable mask and pressure. She had a CPAP titration study on 07/01/2009 which I was able to review through your records: She had a titration up to 15 cm.  Her AHI was reduced according to the report, sleep efficiency 97%, REM latency 209 minutes. She had no slow-wave sleep. A baseline sleep study report was not available for my review and was also not mentioned in results in the CPAP titration study which was interpreted by Dr. Brandon [name].   She had a tonsillectomy as a child. Her Epworth sleepiness score is 6 out of 24 today, her fatigue score is 60 out of 63. She sleeps with the head of bed elevated at 45 for perforans. She feels she can breathe better at night like that. She has a hospital bed.  Her Past Medical History Is Significant For: Past Medical History  Diagnosis Date  . Obesity   . Hyperlipemia   . Arthritis   . Diabetes mellitus type II   . Hypertension   . Heart murmur   . Mental disorder     Bipolar  . Anxiety   . Shortness of breath   . Sleep apnea     uses CPAP  . COPD (chronic obstructive pulmonary disease) ([place])   . Hypothyroidism     Her Past Surgical History Is Significant For: Past Surgical History  Procedure Laterality Date  . Cholecystectomy    . Eye surgery    . Partial mastectomy with needle localization Left 05/10/2012    Procedure: PARTIAL MASTECTOMY WITH NEEDLE LOCALIZATION;  Surgeon: Chelsea Hector, MD;  Location: [facility];  Service: General;  Laterality: Left;    Her Family History Is Significant For: Family History  Problem Relation Age of Onset  . Alcohol abuse Father   . Stroke Father   . Alzheimer's disease Mother     Her Social History Is Significant For: Social History   Social History  . Marital Status: Divorced    Spouse Name: N/A  . Number of Children: 0  . Years of Education: HS   Occupational History  . Retired     Social History Main Topics  . Smoking status: Former Smoker -- 1.50 packs/day for 20 years    Types: Cigarettes    Quit date: 05/03/1993  . Smokeless tobacco: Never Used  . Alcohol Use: No  . Drug Use: No  . Sexual Activity: Not Currently   Other Topics Concern   . None   Social History Narrative   Drinks 2 cups of coffee a day    Her Allergies Are:  Allergies  Allergen Reactions  . Darvocet [Propoxyphene N-Acetaminophen] Anaphylaxis    Can take plain tylenol  . Fluoxetine Other (See Comments)    Hallucinations   . Penicillins     REACTION: rash  . Promethazine Hives  :   Her Current Medications Are:  Outpatient Encounter Prescriptions as of 09/12/2015  Medication Sig  . acetaminophen (TYLENOL) 325 MG tablet Take 2 tablets (650 mg total) by mouth every 6 (six) hours as needed for pain.  Marland Kitchen albuterol (PROVENTIL HFA;VENTOLIN HFA) 108 (90 BASE) MCG/ACT inhaler  Inhale 2 puffs into the lungs every 6 (six) hours as needed. Inhale 2 puffs every 6 hrs prn.  Inhale 2 puffs every 4-6 hrs spaced 60 sec apart.  Marland Kitchen aspirin 81 MG chewable tablet Chew 81 mg by mouth daily.  . bumetanide (BUMEX) 1 MG tablet Take 1 tablet (1 mg total) by mouth daily.  . canagliflozin (INVOKANA) 300 MG TABS tablet Take 300 mg by mouth daily before breakfast.  . clonazePAM (KLONOPIN) 1 MG tablet Take 1 tablet (1 mg total) by mouth at bedtime.  . fluticasone (FLONASE) 50 MCG/ACT nasal spray Place 2 sprays into the nose daily.  . Insulin Detemir (LEVEMIR FLEXPEN) 100 UNIT/ML SOPN Inject 70 Units into the skin at bedtime.   . Insulin Glargine (TOUJEO SOLOSTAR [place]) Inject 85 Units into the skin at bedtime.  Marland Kitchen levothyroxine (SYNTHROID) 150 MCG tablet Take 150 mcg by mouth daily.  . metFORMIN (GLUCOPHAGE) 1000 MG tablet Take 1,000 mg by mouth 2 (two) times daily with a meal.  . PARoxetine (PAXIL) 40 MG tablet Take 1 tablet (40 mg total) by mouth every morning.  . traZODone (DESYREL) 100 MG tablet Take 2 tablets (200 mg total) by mouth at bedtime.  . ziprasidone (GEODON) 80 MG capsule Take 1 capsule (80 mg total) by mouth at bedtime.  . [DISCONTINUED] BENICAR 20 MG tablet Take 20 mg by mouth daily.  . [DISCONTINUED] pioglitazone (ACTOS) 45 MG tablet Take 45 mg by mouth daily.   No  facility-administered encounter medications on file as of 09/12/2015.  :  Review of Systems:  Out of a complete 14 point review of systems, all are reviewed and negative with the exception of these symptoms as listed below:    Review of Systems  Neurological:       Patient is here to discuss sleep study. No new concerns.     Objective:  Neurologic Exam  Physical Exam Physical Examination:   Filed Vitals:   09/12/15 1548  BP: 114/82  Pulse: 72  Resp: 16    General Examination: The patient is a very pleasant 65 y.o. female in no acute distress. She appears well-developed and well-nourished and adequately groomed.   HEENT: Normocephalic, atraumatic, pupils are equal, round and reactive to light and accommodation. Extraocular tracking is good without limitation to gaze excursion or nystagmus noted. Normal smooth pursuit is noted. Hearing is grossly intact. Face is symmetric with normal facial animation and normal facial sensation. Speech is clear with no dysarthria noted. There is no hypophonia. There is no lip, neck/head, jaw or voice tremor. Neck is supple with full range of passive and active motion. There are no carotid bruits on auscultation. Oropharynx exam reveals: moderate mouth dryness, adequate dental hygiene and moderate airway crowding, due to smaller airway entry and redundant soft palate. Mallampati is class II. Tongue protrudes centrally and palate elevates symmetrically. Tonsils are absent.   Chest: Clear to auscultation without wheezing, rhonchi or crackles noted.  Heart: S1+S2+0, regular and normal without murmurs, rubs or gallops noted.   Abdomen: Soft, non-tender and non-distended with normal bowel sounds appreciated on auscultation.  Extremities: There is no pitting edema in the distal lower extremities bilaterally. Pedal pulses are intact.  Skin: Warm and dry without trophic changes noted. There are no varicose veins.  Musculoskeletal: exam reveals no obvious  joint deformities, tenderness or joint swelling or erythema, with the exception of bilateral mild knee swelling, she reports bilateral knee pain and left hip pain.   Neurologically:  Mental status:  The patient is awake, alert and oriented in all 4 spheres. Her immediate and remote memory, attention, language skills and fund of knowledge are appropriate. There is no evidence of aphasia, agnosia, apraxia or anomia. Speech is clear with normal prosody and enunciation. Thought process is linear. Mood is constricted and affect is blunted. There is mild psychomotor retardation. Cranial nerves II - XII are as described above under HEENT exam. In addition: shoulder shrug is normal with equal shoulder height noted. Motor exam: Normal bulk, strength and tone is noted. There is no drift, tremor or rebound. Romberg is negative. Reflexes are 1-2+ throughout. Fine motor skills and coordination: intact with normal finger taps, normal hand movements, normal rapid alternating patting, normal foot taps and normal foot agility.  Cerebellar testing: No dysmetria or intention tremor on finger to nose testing. Heel to shin is unremarkable bilaterally. There is no truncal or gait ataxia.  Sensory exam: intact to light touch in the UEs and LEs.  Gait, station and balance: She stands with difficulty. No veering to one side is noted. No leaning to one side is noted. Posture is age-appropriate and stance is narrow based. Gait shows slow and cautious gait, tandem walk is not possible.   Assessment and Plan:  In summary, Chelsea [name] is a very pleasant 65 year old female with a complex medical history of hypothyroidism, hypertension, type 2 diabetes, hyperlipidemia, morbid obesity, mood disorder, including paranoid schizophrenia for which she is followed by mental health, Dr. Adele [name], Vitamin D deficiency, allergic rhinitis, osteoarthritis, who presents for follow-up consultation of her sleep disturbance, after her recent sleep  study. Her recent baseline sleep study from 08/12/2015 showed no significant obstructive sleep apnea but she had lower trending oxygen saturations at night, the study was done without supplemental oxygen. She is advised to continue with her nighttime oxygen supplementation. Furthermore, she had significant leg twitching in keeping with PLMD. She does endorse some restless leg symptoms. This is not a severe problem or at this time and since she is on multiple psychotropic medications we mutually agreed to not try to add yet another medication to her regimen. We will monitor her RLS symptoms and leg twitching. She is agreeable. She is commended for trying to lose weight and is advised to stay well-hydrated and active mentally and physically. CPAP therapy is not warranted.  Physical exam is stable. I suggested a six-month checkup and we will revisit symptomatic treatment of RLS and PLMS at the time. I answered all her questions today and the patient and her sister were in agreement with the plan. I spent 25 minutes in total face-to-face time with the patient, more than 50% of which was spent in counseling and coordination of care, reviewing test results, reviewing medication and discussing or reviewing the diagnosis of PLMD, and RLS, its prognosis and treatment options.

## 2015-09-20 ENCOUNTER — Institutional Professional Consult (permissible substitution): Payer: Self-pay | Admitting: Pulmonary Disease

## 2015-09-24 DIAGNOSIS — M179 Osteoarthritis of knee, unspecified: Secondary | ICD-10-CM | POA: Diagnosis not present

## 2015-09-24 DIAGNOSIS — E119 Type 2 diabetes mellitus without complications: Secondary | ICD-10-CM | POA: Diagnosis not present

## 2015-10-09 DIAGNOSIS — I10 Essential (primary) hypertension: Secondary | ICD-10-CM | POA: Diagnosis not present

## 2015-10-09 DIAGNOSIS — E039 Hypothyroidism, unspecified: Secondary | ICD-10-CM | POA: Diagnosis not present

## 2015-10-09 DIAGNOSIS — E119 Type 2 diabetes mellitus without complications: Secondary | ICD-10-CM | POA: Diagnosis not present

## 2015-10-11 DIAGNOSIS — E119 Type 2 diabetes mellitus without complications: Secondary | ICD-10-CM | POA: Diagnosis not present

## 2015-10-11 DIAGNOSIS — I10 Essential (primary) hypertension: Secondary | ICD-10-CM | POA: Diagnosis not present

## 2015-10-11 DIAGNOSIS — E039 Hypothyroidism, unspecified: Secondary | ICD-10-CM | POA: Diagnosis not present

## 2015-10-23 ENCOUNTER — Other Ambulatory Visit (HOSPITAL_COMMUNITY): Payer: Self-pay | Admitting: Psychiatry

## 2015-10-23 DIAGNOSIS — F200 Paranoid schizophrenia: Secondary | ICD-10-CM

## 2015-10-24 ENCOUNTER — Other Ambulatory Visit (HOSPITAL_COMMUNITY): Payer: Self-pay

## 2015-10-24 DIAGNOSIS — F200 Paranoid schizophrenia: Secondary | ICD-10-CM

## 2015-10-24 MED ORDER — CLONAZEPAM 1 MG PO TABS: 1.0000 mg | ORAL_TABLET | Freq: Every day | ORAL | 0 refills | Status: DC

## 2015-11-13 DIAGNOSIS — N183 Chronic kidney disease, stage 3 (moderate): Secondary | ICD-10-CM | POA: Diagnosis not present

## 2015-11-13 DIAGNOSIS — I129 Hypertensive chronic kidney disease with stage 1 through stage 4 chronic kidney disease, or unspecified chronic kidney disease: Secondary | ICD-10-CM | POA: Diagnosis not present

## 2015-11-13 DIAGNOSIS — N390 Urinary tract infection, site not specified: Secondary | ICD-10-CM | POA: Diagnosis not present

## 2015-11-20 ENCOUNTER — Other Ambulatory Visit (HOSPITAL_COMMUNITY): Payer: Self-pay | Admitting: Psychiatry

## 2015-11-20 DIAGNOSIS — F200 Paranoid schizophrenia: Secondary | ICD-10-CM

## 2015-11-22 ENCOUNTER — Other Ambulatory Visit (HOSPITAL_COMMUNITY): Payer: Self-pay

## 2015-11-22 DIAGNOSIS — F200 Paranoid schizophrenia: Secondary | ICD-10-CM

## 2015-11-22 MED ORDER — CLONAZEPAM 1 MG PO TABS: 1.0000 mg | ORAL_TABLET | Freq: Every day | ORAL | 0 refills | Status: DC

## 2015-11-22 NOTE — Progress Notes (Signed)
Patient called today for a refill on her Klonopin, she has an appointment next month. Refill was appropriate and per protocol I sent in one month of rx to the pharmacy.

## 2015-12-05 ENCOUNTER — Other Ambulatory Visit (HOSPITAL_COMMUNITY): Payer: Self-pay | Admitting: Psychiatry

## 2015-12-05 DIAGNOSIS — F200 Paranoid schizophrenia: Secondary | ICD-10-CM

## 2015-12-12 ENCOUNTER — Ambulatory Visit (HOSPITAL_COMMUNITY): Payer: Self-pay | Admitting: Psychiatry

## 2015-12-22 ENCOUNTER — Other Ambulatory Visit (HOSPITAL_COMMUNITY): Payer: Self-pay | Admitting: Psychiatry

## 2015-12-22 DIAGNOSIS — F200 Paranoid schizophrenia: Secondary | ICD-10-CM

## 2015-12-24 ENCOUNTER — Other Ambulatory Visit (HOSPITAL_COMMUNITY): Payer: Self-pay

## 2015-12-24 DIAGNOSIS — F200 Paranoid schizophrenia: Secondary | ICD-10-CM

## 2015-12-24 NOTE — Telephone Encounter (Signed)
Medication refill - message left by patient requesting a refill of her Klonopin, last filled on 11/22/15 with no refills. Pt. last seen 09/11/15, cancelled 12/12/15 due to MD out and no current appointment scheduled.  Patient requests prescription until can be evaluated.

## 2015-12-26 ENCOUNTER — Telehealth (HOSPITAL_COMMUNITY): Payer: Self-pay

## 2015-12-27 MED ORDER — CLONAZEPAM 1 MG PO TABS: 1.0000 mg | ORAL_TABLET | Freq: Every day | ORAL | 0 refills | Status: DC

## 2015-12-27 NOTE — Telephone Encounter (Signed)
Called in medication order to [facility] per Dr. Adele Schilder authorization of a one time refill of patient's Clonazepam.  Called patient to inform her prescription was called into her [facility] and requested she call back to set up her next appointment with Dr. Adele Schilder per Dr. Adele Schilder instruction.  Patient agreed with plan and will call back if any problems filling medication order.

## 2016-01-03 ENCOUNTER — Other Ambulatory Visit (HOSPITAL_COMMUNITY): Payer: Self-pay | Admitting: Psychiatry

## 2016-01-03 DIAGNOSIS — F200 Paranoid schizophrenia: Secondary | ICD-10-CM

## 2016-01-21 ENCOUNTER — Other Ambulatory Visit (HOSPITAL_COMMUNITY): Payer: Self-pay | Admitting: Psychiatry

## 2016-01-21 DIAGNOSIS — F200 Paranoid schizophrenia: Secondary | ICD-10-CM

## 2016-01-23 ENCOUNTER — Ambulatory Visit (INDEPENDENT_AMBULATORY_CARE_PROVIDER_SITE_OTHER): Payer: Medicare Other | Admitting: Psychiatry

## 2016-01-23 ENCOUNTER — Encounter (HOSPITAL_COMMUNITY): Payer: Self-pay | Admitting: Psychiatry

## 2016-01-23 DIAGNOSIS — F200 Paranoid schizophrenia: Secondary | ICD-10-CM | POA: Diagnosis not present

## 2016-01-23 MED ORDER — ZIPRASIDONE HCL 80 MG PO CAPS: 80.0000 mg | ORAL_CAPSULE | Freq: Every day | ORAL | 2 refills | Status: DC

## 2016-01-23 MED ORDER — TRAZODONE HCL 100 MG PO TABS: 200.0000 mg | ORAL_TABLET | Freq: Every day | ORAL | 2 refills | Status: DC

## 2016-01-23 MED ORDER — PAROXETINE HCL 40 MG PO TABS: 40.0000 mg | ORAL_TABLET | Freq: Every morning | ORAL | 2 refills | Status: DC

## 2016-01-23 MED ORDER — CLONAZEPAM 1 MG PO TABS: 1.0000 mg | ORAL_TABLET | Freq: Every day | ORAL | 2 refills | Status: DC

## 2016-01-23 NOTE — Progress Notes (Signed)
[facility] [phone] Progress Note  Chelsea [name] [id number] 65 y.o.  01/23/2016 2:21 PM  Chief Complaint: I have been more anxious recently because my sister doing babysitting for 17-year-old niece .  Sometime I also keep her .       History of Present Illness: Chelsea [name] came for her followup appointment.  She is compliant with the medication.  Recently she has noticed some time anxious and nervous because her sister is keeping 80-year-old niece and there are time when she also keep her .  She has not done babysitting in a while.  She feels overwhelmed.  She sleeping okay.  She is taking Klonopin at night.  She is taking trazodone and Geodon is helping her depression.  Lately she has no hallucination or any voices.  Her energy level is fair.  Her appetite is okay.  She has no tremors or shakes.  Patient denies drinking alcohol or using any illegal substances.  She wants to continue her current psychiatric medication.  Suicidal Ideation: No Plan Formed: No Patient has means to carry out plan: No  Homicidal Ideation: No Plan Formed: No Patient has means to carry out plan: No  Review of Systems  Constitutional: Negative.   Cardiovascular: Negative for chest pain and palpitations.  Musculoskeletal: Positive for back pain.  Neurological: Negative for dizziness and tremors.  Psychiatric/Behavioral: Negative.     Psychiatric: Agitation: No Hallucination: No  Depressed Mood: No Insomnia: No Hypersomnia: No Altered Concentration: No Feels Worthless: No Grandiose Ideas: No Belief In Special Powers: No New/Increased Substance Abuse: No Compulsions: No  Neurologic: Headache: Yes Seizure: No Paresthesias: No  Medical history. Hypertension, morbid obesity, hyperlipidemia, sleep apnea and arthritis.  Her primary care physician is Dr. Jalene Mullet.    Outpatient Encounter Prescriptions as of 01/23/2016  Medication Sig Dispense Refill  . acetaminophen (TYLENOL) 325 MG tablet  Take 2 tablets (650 mg total) by mouth every 6 (six) hours as needed for pain.    Marland Kitchen albuterol (PROVENTIL HFA;VENTOLIN HFA) 108 (90 BASE) MCG/ACT inhaler Inhale 2 puffs into the lungs every 6 (six) hours as needed. Inhale 2 puffs every 6 hrs prn.  Inhale 2 puffs every 4-6 hrs spaced 60 sec apart.    Marland Kitchen aspirin 81 MG chewable tablet Chew 81 mg by mouth daily.    . bumetanide (BUMEX) 1 MG tablet Take 1 tablet (1 mg total) by mouth daily. 30 tablet 0  . canagliflozin (INVOKANA) 300 MG TABS tablet Take 300 mg by mouth daily before breakfast.    . clonazePAM (KLONOPIN) 1 MG tablet Take 1 tablet (1 mg total) by mouth at bedtime. 30 tablet 2  . fluticasone (FLONASE) 50 MCG/ACT nasal spray Place 2 sprays into the nose daily.    . Insulin Detemir (LEVEMIR FLEXPEN) 100 UNIT/ML SOPN Inject 70 Units into the skin at bedtime.     . Insulin Glargine (TOUJEO SOLOSTAR [place]) Inject 85 Units into the skin at bedtime.    Marland Kitchen levothyroxine (SYNTHROID) 150 MCG tablet Take 150 mcg by mouth daily.    . metFORMIN (GLUCOPHAGE) 1000 MG tablet Take 1,000 mg by mouth 2 (two) times daily with a meal.    . PARoxetine (PAXIL) 40 MG tablet Take 1 tablet (40 mg total) by mouth every morning. 30 tablet 2  . traZODone (DESYREL) 100 MG tablet Take 2 tablets (200 mg total) by mouth at bedtime. 60 tablet 2  . ziprasidone (GEODON) 80 MG capsule Take 1 capsule (80 mg total) by mouth  at bedtime. 30 capsule 2  . [DISCONTINUED] clonazePAM (KLONOPIN) 1 MG tablet Take 1 tablet (1 mg total) by mouth at bedtime. 30 tablet 0  . [DISCONTINUED] PARoxetine (PAXIL) 40 MG tablet TAKE 1 TABLET BY MOUTH EVERY MORNING 30 tablet 0  . [DISCONTINUED] traZODone (DESYREL) 100 MG tablet TAKE 2 TABLETS BY MOUTH AT BEDTIME 60 tablet 0  . [DISCONTINUED] ziprasidone (GEODON) 80 MG capsule TAKE 1 CAPSULE BY MOUTH AT BEDTIME 30 capsule 0   No facility-administered encounter medications on file as of 01/23/2016.     Past Psychiatric  History/Hospitalization(s): Patient has long history of psychiatric illness.  She was [facility] at [facility] and [facility].  There is no history of suicidal attempt however she has history of mania and psychosis.  She admitted talking to the picture and herself in the past.  She do not remember what medicine she had tried in the past.   Anxiety: Yes Bipolar Disorder: No Depression: Yes Mania: No Psychosis: Yes Schizophrenia: Yes Personality Disorder: No Hospitalization for psychiatric illness: Yes History of Electroconvulsive Shock Therapy: No Prior Suicide Attempts: No  Physical Exam: Constitutional:  BP 124/72   Pulse 98   Ht 5\' 2"  (1.575 m)   Wt 259 lb 12.8 oz (117.8 kg)   BMI 47.52 kg/m   General Appearance: obese and Maintained fair eye contact.  She is casually dressed and fairly groomed.  Musculoskeletal: Strength & Muscle Tone: Back pain Gait & Station: normal, slow to walk Patient leans: N/A  Mental status examination: Patient is a obese female.  She is pleasant and cooperative. She is casually dressed.  She maintained fair eye contact.  Her speech is slow , soft but clear and coherent.  Her thought process is slow but logical.  She denies any auditory or visual hallucination.  Her attention and concentration is fair.  There were no delusions, paranoia or any obsessive thoughts.  She denies any active or passive suicidal thoughts or homicidal thought.  Her psychomotor activity is slow.  There were no tremors, shakes or any EPS.  Her fund of knowledge is fair.  Her cognition is grossly intact.  She's alert and oriented 3.  Her insight judgment and impulse control is okay.  Established Problem, Stable/Improving (1), Review of Psycho-Social Stressors (1), Established Problem, Worsening (2), Review of Last Therapy Session (1) and Review of Medication Regimen & Side Effects (2)  Assessment: Axis I: Schizophrenia chronic paranoid type, anxiety disorder NOS  Axis II:  Deferred  Axis III: See medical history.  Plan:  Discuss psychosocial stressors.  Recommended to see counselor for coping skills.  At this time he does not have any side effects.  I will continue Geodon 80 mg at bedtime, Klonopin 1 mg at bedtime Paxil 40 mg daily and trazodone 200 mg at bedtime.  Discussed medication side effects and benefits.  Recommended to call us back if she has any question or any concern.  Follow-up in 3 months.   Chelsea [name] T., MD 01/23/2016                Patient ID: [name], female   DOB: 01-04-1951, 65 y.o.   MRN: [id number]

## 2016-02-05 DIAGNOSIS — Z6841 Body Mass Index (BMI) 40.0 and over, adult: Secondary | ICD-10-CM | POA: Diagnosis not present

## 2016-02-05 DIAGNOSIS — E039 Hypothyroidism, unspecified: Secondary | ICD-10-CM | POA: Diagnosis not present

## 2016-02-05 DIAGNOSIS — E119 Type 2 diabetes mellitus without complications: Secondary | ICD-10-CM | POA: Diagnosis not present

## 2016-02-05 DIAGNOSIS — Z23 Encounter for immunization: Secondary | ICD-10-CM | POA: Diagnosis not present

## 2016-02-05 DIAGNOSIS — I509 Heart failure, unspecified: Secondary | ICD-10-CM | POA: Diagnosis not present

## 2016-02-05 DIAGNOSIS — E782 Mixed hyperlipidemia: Secondary | ICD-10-CM | POA: Diagnosis not present

## 2016-02-28 ENCOUNTER — Ambulatory Visit (HOSPITAL_COMMUNITY): Payer: Self-pay | Admitting: Psychology

## 2016-03-13 ENCOUNTER — Ambulatory Visit (HOSPITAL_COMMUNITY): Payer: Self-pay | Admitting: Psychology

## 2016-03-17 ENCOUNTER — Ambulatory Visit (INDEPENDENT_AMBULATORY_CARE_PROVIDER_SITE_OTHER): Payer: Medicare Other | Admitting: Neurology

## 2016-03-17 ENCOUNTER — Encounter: Payer: Self-pay | Admitting: Neurology

## 2016-03-17 VITALS — BP 140/76 | HR 74 | Resp 20 | Ht 62.0 in | Wt 258.0 lb

## 2016-03-17 DIAGNOSIS — G2581 Restless legs syndrome: Secondary | ICD-10-CM

## 2016-03-17 MED ORDER — ROPINIROLE HCL 0.25 MG PO TABS: ORAL_TABLET | ORAL | 5 refills | Status: DC

## 2016-03-17 NOTE — Progress Notes (Signed)
Subjective:    Patient ID: Chelsea [name] is a 65 y.o. female.  HPI     Interim history:   Chelsea [name] is a 65 year old right-handed woman with a complex medical history of hypothyroidism, hypertension, type 2 diabetes, hyperlipidemia, morbid obesity, mood disorder, including paranoid schizophrenia for which she is followed by mental health, Dr. Adele Schilder, Vitamin D deficiency, allergic rhinitis, osteoarthritis, who presents for follow-up consultation of her sleep disturbance, including mild restless leg syndrome and evidence of PLMS during a sleep study. The patient is accompanied by her sister today. I last saw her on 09/12/2015 after her recent sleep study but talked about her results at the time. She reported no new issues. She had some intermittent restless legs symptoms, but we mutually agreed to monitor. She was on supplemental oxygen at night which I asked her to continue as she did not have any overt obstructive sleep apnea type changes during sleep study but lower trending oxygen saturations. Supplemental oxygen was not utilized for the study. She was trying to lose weight and lost a few pounds. I suggested a six-month recheck.  Today, 03/17/2016: She reports that her legs still bother her at night. She was recently seen by her psychiatrist on 01/23/2016. She has some symptoms when sitting still, in her recliner while watching TV for example. She sees a kidney specialist, has a history of mild kidney impairment. Per Dr. Adele Schilder, she had not med changes.    Previously:   I first met her on 07/23/2015 at the request of her primary care physician, at which time patient reported a prior diagnosis of obstructive sleep apnea, on 8-10 years ago but she was no longer on CPAP therapy and was using oxygen at night. I invited her back for sleep study. She had a diagnostic sleep study on 08/12/2015. I went over her test results with her in detail today. Sleep efficiency was reduced at 82.7% with a latency  to sleep of 28 minutes and wake after sleep onset of 57 minutes with mild to moderate sleep fragmentation noted. She had an elevated arousal index. She had an increased percentage of light stage sleep, absence of slow-wave sleep and a decreased percentage of REM sleep with a prolonged REM latency. She had moderate PLMS with an index of 34 per hour, resulting in 8 arousals per hour. She hadn't moderate snoring. AHI was 3.1 per hour in total, rising to 20 per hour during REM sleep. Average oxygen desaturation was 87%, nadir was 77%. Time below 90% saturation was 6 hours and 42 minutes, time below 88% saturation was 6 hours and 22 minutes. Study was conducted without supplemental oxygen, she uses oxygen at home. She was advised to continue with her home oxygen therapy.   07/23/2015: She was previously diagnosed with obstructive sleep apnea several years ago, maybe 8-10 years ago, but is no longer using CPAP. She uses oxygen at night. Prior sleep study results are not available for my review today. I reviewed your office note from 07/05/2015, which you kindly included. She had blood work on 07/05/15 , which per sister, were okay. She also has had symptoms of restless legs and sometimes has to walk around.   She has nocturia, about 2-3 times a night. She has occasional morning headaches, about 3 times a week, does not usually take medication.   She does not watch TV in bed.   She goes to bed around 8 PM and takes several medications at night. Rise time is around 6:30  AM. She has a cat, that sleeps in her bed.   She is divorced, no children.   She had an upper respiratory infection about 4 months ago and was placed on oxygen at the time. She currently uses this at night, 2 L/m. She has an appointment with Dr. Halford Chessman in pulmonology pending for June of this year. She has quit smoking many years ago, 1995. She quit drinking alcohol in 1990 and had a history of excessive alcohol use. Her father was an alcoholic. She has  1 sister, Chelsea [name], and patient lives with her sister and sister's husband. She has no biological children. She is retired from housekeeping at the hospital. She has a high school education. She is divorced. She drinks caffeine in the form of coffee, 2 cups per day and occasional diet sodas. She moves a lot in her sleep. She would be willing to try CPAP again. Her main issue with CPAP was uncomfortable mask and pressure. She had a CPAP titration study on 07/01/2009 which I was able to review through your records: She had a titration up to 15 cm. Her AHI was reduced according to the report, sleep efficiency 97%, REM latency 209 minutes. She had no slow-wave sleep. A baseline sleep study report was not available for my review and was also not mentioned in results in the CPAP titration study which was interpreted by Dr. Brandon Melnick.   She had a tonsillectomy as a child. Her Epworth sleepiness score is 6 out of 24 today, her fatigue score is 60 out of 63. She sleeps with the head of bed elevated at 45 for perforans. She feels she can breathe better at night like that. She has a hospital bed.  Her Past Medical History Is Significant For: Past Medical History:  Diagnosis Date  . Anxiety   . Arthritis   . COPD (chronic obstructive pulmonary disease) ([place])   . Diabetes mellitus type II   . Heart murmur   . Hyperlipemia   . Hypertension   . Hypothyroidism   . Mental disorder    Bipolar  . Obesity   . Shortness of breath   . Sleep apnea    uses CPAP    Her Past Surgical History Is Significant For: Past Surgical History:  Procedure Laterality Date  . CHOLECYSTECTOMY    . EYE SURGERY    . PARTIAL MASTECTOMY WITH NEEDLE LOCALIZATION Left 05/10/2012   Procedure: PARTIAL MASTECTOMY WITH NEEDLE LOCALIZATION;  Surgeon: Adin Hector, MD;  Location: [facility];  Service: General;  Laterality: Left;    Her Family History Is Significant For: Family History  Problem Relation Age of Onset  . Alcohol abuse Father    . Stroke Father   . Alzheimer's disease Mother     Her Social History Is Significant For: Social History   Social History  . Marital status: Divorced    Spouse name: N/A  . Number of children: 0  . Years of education: HS   Occupational History  . Retired     Social History Main Topics  . Smoking status: Former Smoker    Packs/day: 1.50    Years: 20.00    Types: Cigarettes    Quit date: 05/03/1993  . Smokeless tobacco: Never Used  . Alcohol use No  . Drug use: No  . Sexual activity: Not Currently   Other Topics Concern  . None   Social History Narrative   Drinks 2 cups of coffee a day    Her Allergies  Are:  Allergies  Allergen Reactions  . Darvocet [Propoxyphene N-Acetaminophen] Anaphylaxis    Can take plain tylenol  . Fluoxetine Other (See Comments)    Hallucinations   . Penicillins     REACTION: rash  . Promethazine Hives  :   Her Current Medications Are:  Outpatient Encounter Prescriptions as of 03/17/2016  Medication Sig  . acetaminophen (TYLENOL) 325 MG tablet Take 2 tablets (650 mg total) by mouth every 6 (six) hours as needed for pain.  Marland Kitchen aspirin 81 MG chewable tablet Chew 81 mg by mouth daily.  . bumetanide (BUMEX) 1 MG tablet Take 1 tablet (1 mg total) by mouth daily.  . clonazePAM (KLONOPIN) 1 MG tablet Take 1 tablet (1 mg total) by mouth at bedtime.  . Insulin Detemir (LEVEMIR FLEXPEN) 100 UNIT/ML SOPN Inject 90 Units into the skin at bedtime.   . Insulin Glargine (TOUJEO SOLOSTAR [place]) Inject 85 Units into the skin at bedtime.  Marland Kitchen levothyroxine (SYNTHROID) 150 MCG tablet Take 150 mcg by mouth daily.  . metFORMIN (GLUCOPHAGE) 1000 MG tablet Take 1,000 mg by mouth 2 (two) times daily with a meal.  . PARoxetine (PAXIL) 40 MG tablet Take 1 tablet (40 mg total) by mouth every morning.  . traZODone (DESYREL) 100 MG tablet Take 2 tablets (200 mg total) by mouth at bedtime.  . ziprasidone (GEODON) 80 MG capsule Take 1 capsule (80 mg total) by mouth at  bedtime.  . [DISCONTINUED] albuterol (PROVENTIL HFA;VENTOLIN HFA) 108 (90 BASE) MCG/ACT inhaler Inhale 2 puffs into the lungs every 6 (six) hours as needed. Inhale 2 puffs every 6 hrs prn.  Inhale 2 puffs every 4-6 hrs spaced 60 sec apart.  . [DISCONTINUED] canagliflozin (INVOKANA) 300 MG TABS tablet Take 300 mg by mouth daily before breakfast.  . [DISCONTINUED] fluticasone (FLONASE) 50 MCG/ACT nasal spray Place 2 sprays into the nose daily.   No facility-administered encounter medications on file as of 03/17/2016.   :  Review of Systems:  Out of a complete 14 point review of systems, all are reviewed and negative with the exception of these symptoms as listed below:  Review of Systems  Neurological:       Pt presents today to discuss her PLMD. Pt says that her legs are still restless at night.    Objective:  Neurologic Exam  Physical Exam Physical Examination:   Vitals:   03/17/16 1505  BP: 140/76  Pulse: 74  Resp: 20   General Examination: The patient is a very pleasant 65 y.o. female in no acute distress. She appears well-developed and well-nourished and adequately groomed.   HEENT: Normocephalic, atraumatic, pupils are equal, round and reactive to light and accommodation. Extraocular tracking is good without limitation to gaze excursion or nystagmus noted. Normal smooth pursuit is noted. Hearing is grossly intact. Face is symmetric with normal facial animation and normal facial sensation. Speech is clear with no dysarthria noted. There is no hypophonia. There is no lip, neck/head, jaw or voice tremor. Neck is supple with full range of passive and active motion. There are no carotid bruits on auscultation. Oropharynx exam reveals: moderate mouth dryness, adequate dental hygiene and moderate airway crowding, due to smaller airway entry and redundant soft palate. Mallampati is class II. Tongue protrudes centrally and palate elevates symmetrically. Tonsils are absent.   Chest: Clear  to auscultation without wheezing, rhonchi or crackles noted.  Heart: S1+S2+0, regular and normal without murmurs, rubs or gallops noted.   Abdomen: Soft, non-tender and non-distended with  normal bowel sounds appreciated on auscultation.  Extremities: There is no pitting edema in the distal lower extremities bilaterally. Pedal pulses are intact.  Skin: Warm and dry without trophic changes noted. There are no varicose veins.  Musculoskeletal: exam reveals no obvious joint deformities, tenderness or joint swelling or erythema, with the exception of bilateral mild knee swelling, she reports bilateral knee pain, L more than R.   Neurologically:  Mental status: The patient is awake, alert and oriented in all 4 spheres. Her immediate and remote memory, attention, language skills and fund of knowledge are appropriate. There is no evidence of aphasia, agnosia, apraxia or anomia. Speech is clear with normal prosody and enunciation. Thought process is linear. Mood is constricted and affect is blunted. There is mild psychomotor retardation. Cranial nerves II - XII are as described above under HEENT exam. In addition: shoulder shrug is normal with equal shoulder height noted. Motor exam: Normal bulk, strength and tone is noted. There is no drift, tremor or rebound. Romberg is negative. Reflexes are 1-2+ throughout. Fine motor skills and coordination: intact with normal finger taps, normal hand movements, normal rapid alternating patting, normal foot taps and normal foot agility.  Cerebellar testing: No dysmetria or intention tremor on finger to nose testing. Heel to shin is unremarkable bilaterally. There is no truncal or gait ataxia.  Sensory exam: intact to light touch in the UEs and LEs.  Gait, station and balance: She stands with difficulty. No veering to one side is noted. No leaning to one side is noted. Posture is age-appropriate and stance is narrow based. Gait shows slow and cautious gait, tandem walk  is not possible.   Assessment and Plan:  In summary, MCKINLEIGH SCHUCHART is a very pleasant 65 year old female with a complex medical history of hypothyroidism, hypertension, type 2 diabetes, hyperlipidemia, morbid obesity, mood disorder, including paranoid schizophrenia for which she is followed by mental health, Dr. Adele Schilder, Vitamin D deficiency, allergic rhinitis, osteoarthritis, who presents for follow-up consultation of her sleep disturbance, including her RLS and PLMs. Her sleep study from 08/12/2015 showed no significant obstructive sleep apnea but she had lower trending oxygen saturations at night, the study was done without supplemental oxygen. She was advised to continue with her nighttime oxygen supplementation. Furthermore, she had significant leg twitching in keeping with PLMs. She does endorse restless leg symptoms. She feels that this problem is disruptive to her day-to-day functioning and especially when she tries to sit still in the evening she is bothered by restless leg symptoms. At this juncture, I suggested we try low-dose Requip generic, 0.25 mg strength one pill at night for 1 week with gradual titration weekly. She has a history of kidney impairment and therefore I would like to avoid utilizing pramipexole at this time. We will be cautious with her titration because she is taking multiple other psychotropic medications, we talked about potential side effects of ropinirole at length today. She is advised to take it at least 2 hours before her typical bedtime. She usually goes to bed between 9:30 and 10 and is advised to take ropinirole at 7 or 7:30 PM. We will monitor her RLS symptoms and I  suggested a 4 month checkup, sooner as needed. Her exam is otherwise stable. She is encouraged to try to lose weight and consider using a cane for gait safety especially since she has significant joint pain in both knees. I answered all their questions today and the patient and her sister were in agreement  with the plan. I spent 25 minutes in total face-to-face time with the patient, more than 50% of which was spent in counseling and coordination of care, reviewing test results, reviewing medication and discussing or reviewing the diagnosis of PLMD, and RLS, its prognosis and treatment options.

## 2016-03-17 NOTE — Patient Instructions (Signed)
For your restless legs symptoms, let's try you on low dose Requip (generic name: ropinirole) 0.25 mg: Take one pill each evening for 1 week, then 2 pills each evening for 1 week, then 3 pills each evening thereafter. Take at least 2 hours before bedtime, around 7:30 or 7 PM.   Common side effects reported are: Sedation, sleepiness, nausea, vomiting, and rare side effects are confusion, hallucinations, swelling in legs, and abnormal behaviors, including impulse control problems, which can manifest as excessive eating, obsessions with food or gambling, or hypersexuality.  Please have Dr. Ernie Hew fax your blood test results to me as well, when you go back in January.   Use a cane for gait safety.

## 2016-03-18 ENCOUNTER — Other Ambulatory Visit (HOSPITAL_COMMUNITY): Payer: Self-pay | Admitting: Psychiatry

## 2016-03-18 DIAGNOSIS — F200 Paranoid schizophrenia: Secondary | ICD-10-CM

## 2016-04-01 ENCOUNTER — Other Ambulatory Visit (HOSPITAL_COMMUNITY): Payer: Self-pay | Admitting: Psychiatry

## 2016-04-01 DIAGNOSIS — F200 Paranoid schizophrenia: Secondary | ICD-10-CM

## 2016-04-14 ENCOUNTER — Other Ambulatory Visit (HOSPITAL_COMMUNITY): Payer: Self-pay | Admitting: Psychiatry

## 2016-04-18 ENCOUNTER — Other Ambulatory Visit (HOSPITAL_COMMUNITY): Payer: Self-pay | Admitting: Psychiatry

## 2016-04-18 DIAGNOSIS — G4733 Obstructive sleep apnea (adult) (pediatric): Secondary | ICD-10-CM | POA: Diagnosis not present

## 2016-04-18 DIAGNOSIS — R0602 Shortness of breath: Secondary | ICD-10-CM | POA: Diagnosis not present

## 2016-04-18 DIAGNOSIS — F200 Paranoid schizophrenia: Secondary | ICD-10-CM

## 2016-04-19 DIAGNOSIS — G4733 Obstructive sleep apnea (adult) (pediatric): Secondary | ICD-10-CM | POA: Diagnosis not present

## 2016-04-19 DIAGNOSIS — R0602 Shortness of breath: Secondary | ICD-10-CM | POA: Diagnosis not present

## 2016-04-22 ENCOUNTER — Telehealth (HOSPITAL_COMMUNITY): Payer: Self-pay

## 2016-04-22 DIAGNOSIS — F200 Paranoid schizophrenia: Secondary | ICD-10-CM

## 2016-04-22 MED ORDER — ZIPRASIDONE HCL 80 MG PO CAPS: 80.0000 mg | ORAL_CAPSULE | Freq: Every day | ORAL | 0 refills | Status: DC

## 2016-04-22 MED ORDER — TRAZODONE HCL 100 MG PO TABS: 200.0000 mg | ORAL_TABLET | Freq: Every day | ORAL | 0 refills | Status: DC

## 2016-04-22 MED ORDER — PAROXETINE HCL 40 MG PO TABS: 40.0000 mg | ORAL_TABLET | Freq: Every morning | ORAL | 0 refills | Status: DC

## 2016-04-22 MED ORDER — CLONAZEPAM 1 MG PO TABS: 1.0000 mg | ORAL_TABLET | Freq: Every day | ORAL | 0 refills | Status: DC

## 2016-04-22 NOTE — Telephone Encounter (Signed)
Medication refills - Telephone call with patient to follow up on message she left she would be out of Klonopin as of today and needed all new refills. Reminded patient of her scheduled appointment set with Dr. Adele Schilder for 04/24/16 at 3:45pm and patient stated plan to keep appointment but states she needs all new medication orders; Klonopin, Trazodone, Geodon, and Paxil before then.  Informed Dr. Lovena Le authorized a one time refill of each until patient comes in for new orders and agreed to call these into patient's requested [facility].  Called in new Klonopin 30 day order and e-scribed in other 3 orders to [facility] with Hannah,pharmacist, each with no refills per Dr. Lovena Le approval.  Patient to keep appointment with Dr. Adele Schilder 04/24/16.

## 2016-04-24 ENCOUNTER — Ambulatory Visit (INDEPENDENT_AMBULATORY_CARE_PROVIDER_SITE_OTHER): Payer: Medicare Other | Admitting: Psychiatry

## 2016-04-24 ENCOUNTER — Encounter (HOSPITAL_COMMUNITY): Payer: Self-pay | Admitting: Psychiatry

## 2016-04-24 DIAGNOSIS — F200 Paranoid schizophrenia: Secondary | ICD-10-CM | POA: Diagnosis not present

## 2016-04-24 DIAGNOSIS — Z9049 Acquired absence of other specified parts of digestive tract: Secondary | ICD-10-CM

## 2016-04-24 DIAGNOSIS — Z810 Family history of intellectual disabilities: Secondary | ICD-10-CM | POA: Diagnosis not present

## 2016-04-24 DIAGNOSIS — Z880 Allergy status to penicillin: Secondary | ICD-10-CM

## 2016-04-24 DIAGNOSIS — Z811 Family history of alcohol abuse and dependence: Secondary | ICD-10-CM

## 2016-04-24 DIAGNOSIS — Z823 Family history of stroke: Secondary | ICD-10-CM | POA: Diagnosis not present

## 2016-04-24 DIAGNOSIS — Z9889 Other specified postprocedural states: Secondary | ICD-10-CM

## 2016-04-24 DIAGNOSIS — Z87891 Personal history of nicotine dependence: Secondary | ICD-10-CM | POA: Diagnosis not present

## 2016-04-24 DIAGNOSIS — Z79899 Other long term (current) drug therapy: Secondary | ICD-10-CM

## 2016-04-24 DIAGNOSIS — Z888 Allergy status to other drugs, medicaments and biological substances status: Secondary | ICD-10-CM | POA: Diagnosis not present

## 2016-04-24 DIAGNOSIS — Z7982 Long term (current) use of aspirin: Secondary | ICD-10-CM | POA: Diagnosis not present

## 2016-04-24 MED ORDER — PAROXETINE HCL 40 MG PO TABS: 40.0000 mg | ORAL_TABLET | Freq: Every morning | ORAL | 0 refills | Status: DC

## 2016-04-24 MED ORDER — TRAZODONE HCL 100 MG PO TABS: 200.0000 mg | ORAL_TABLET | Freq: Every day | ORAL | 0 refills | Status: DC

## 2016-04-24 MED ORDER — CLONAZEPAM 1 MG PO TABS: 1.0000 mg | ORAL_TABLET | Freq: Every day | ORAL | 2 refills | Status: DC

## 2016-04-24 MED ORDER — ZIPRASIDONE HCL 80 MG PO CAPS: 80.0000 mg | ORAL_CAPSULE | Freq: Every day | ORAL | 0 refills | Status: DC

## 2016-04-24 NOTE — Progress Notes (Signed)
BH MD/PA/NP OP Progress Note  04/24/2016 3:54 PM Chelsea [name]  MRN:  [id number]  Chief Complaint:  Subjective:  I had a good Christmas.  I'm taking my medication.  My niece had another baby last month.   HPI: Chelsea [name] came for her follow-up appointment.  She is taking her medication as prescribed.  She is excited because her niece has another baby who is one 1 old.  Patient's sister is babysitting and some time she also helps her sister.  Patient had a good Christmas.  She likes her medication which is helping her paranoia, hallucination, psychosis.  She sleeping good.  She denies any irritability, anger, mania or any psychosis.  She denies any panic attack and actually enjoys some time helping her sister for babysitting.  She sleeping good.  Her energy level is fair.  She recently seen her primary care physician Dr. Sarina Ill for the management of diabetes.  She also saw a neurologist Dr. Tillie Fantasia last month was started her on Requip for restless.  She is doing much better since taking the Requip.  Her restless leg is much better.  Patient wants to continue her current psychiatric medication which is Geodon, Klonopin and Paxil.  She has no tremors shakes or any EPS.  Her appetite is okay.  Her vital signs are stable.  Visit Diagnosis:    ICD-9-CM ICD-10-CM   1. Paranoid schizophrenia ([place]) 295.30 F20.0 ziprasidone (GEODON) 80 MG capsule     clonazePAM (KLONOPIN) 1 MG tablet     PARoxetine (PAXIL) 40 MG tablet     traZODone (DESYREL) 100 MG tablet    Past Psychiatric History: Reviewed. Patient has long history of psychiatric illness.  She was [facility] at [facility] and [facility].  There is no history of suicidal attempt however she has history of mania and psychosis.  She admitted talking to the picture and herself in the past.  She do not remember what medicine she had tried in the past.    Past Medical History:  Past Medical History:  Diagnosis Date  . Anxiety   . Arthritis   . COPD  (chronic obstructive pulmonary disease) ([place])   . Diabetes mellitus type II   . Heart murmur   . Hyperlipemia   . Hypertension   . Hypothyroidism   . Mental disorder    Bipolar  . Obesity   . Shortness of breath   . Sleep apnea    uses CPAP    Past Surgical History:  Procedure Laterality Date  . CHOLECYSTECTOMY    . EYE SURGERY    . PARTIAL MASTECTOMY WITH NEEDLE LOCALIZATION Left 05/10/2012   Procedure: PARTIAL MASTECTOMY WITH NEEDLE LOCALIZATION;  Surgeon: Adin Hector, MD;  Location: [facility];  Service: General;  Laterality: Left;    Family Psychiatric History: Reviewed.  Family History:  Family History  Problem Relation Age of Onset  . Alcohol abuse Father   . Stroke Father   . Alzheimer's disease Mother     Social History:  Social History   Social History  . Marital status: Divorced    Spouse name: N/A  . Number of children: 0  . Years of education: HS   Occupational History  . Retired     Social History Main Topics  . Smoking status: Former Smoker    Packs/day: 1.50    Years: 20.00    Types: Cigarettes    Quit date: 05/03/1993  . Smokeless tobacco: Never Used  . Alcohol use No  .  Drug use: No  . Sexual activity: Not Currently   Other Topics Concern  . None   Social History Narrative   Drinks 2 cups of coffee a day    Allergies:  Allergies  Allergen Reactions  . Darvocet [Propoxyphene N-Acetaminophen] Anaphylaxis    Can take plain tylenol  . Fluoxetine Other (See Comments)    Hallucinations   . Penicillins     REACTION: rash  . Promethazine Hives    Metabolic Disorder Labs: Lab Results  Component Value Date   HGBA1C 6.8 (H) 12/03/2012   MPG 148 (H) 12/03/2012   No results found for: PROLACTIN No results found for: CHOL, TRIG, HDL, CHOLHDL, VLDL, LDLCALC   Current Medications: Current Outpatient Prescriptions  Medication Sig Dispense Refill  . acetaminophen (TYLENOL) 325 MG tablet Take 2 tablets (650 mg total) by mouth every 6  (six) hours as needed for pain.    Marland Kitchen aspirin 81 MG chewable tablet Chew 81 mg by mouth daily.    . bumetanide (BUMEX) 1 MG tablet Take 1 tablet (1 mg total) by mouth daily. 30 tablet 0  . clonazePAM (KLONOPIN) 1 MG tablet Take 1 tablet (1 mg total) by mouth at bedtime. 30 tablet 0  . Insulin Detemir (LEVEMIR FLEXPEN) 100 UNIT/ML SOPN Inject 90 Units into the skin at bedtime.     . Insulin Glargine (TOUJEO SOLOSTAR [place]) Inject 85 Units into the skin at bedtime.    Marland Kitchen levothyroxine (SYNTHROID) 150 MCG tablet Take 150 mcg by mouth daily.    . metFORMIN (GLUCOPHAGE) 1000 MG tablet Take 1,000 mg by mouth 2 (two) times daily with a meal.    . PARoxetine (PAXIL) 40 MG tablet Take 1 tablet (40 mg total) by mouth every morning. 30 tablet 0  . rOPINIRole (REQUIP) 0.25 MG tablet 1 pill nightly x 1 week, then 2 pills nightly x 1 w, then 3 pills nightly thereafter. 2 hours before bedtime. 90 tablet 5  . traZODone (DESYREL) 100 MG tablet Take 2 tablets (200 mg total) by mouth at bedtime. 60 tablet 0  . ziprasidone (GEODON) 80 MG capsule Take 1 capsule (80 mg total) by mouth at bedtime. 30 capsule 0   No current facility-administered medications for this visit.     Neurologic: Headache: No Seizure: No Paresthesias: No  Musculoskeletal: Strength & Muscle Tone: within normal limits Gait & Station: normal Patient leans: N/A  Psychiatric Specialty Exam: Review of Systems  Constitutional: Negative.   HENT: Negative.   Musculoskeletal: Negative.   Skin: Negative.   Neurological: Negative.     Blood pressure 124/60, pulse 83, height 5\' 2"  (1.575 m), weight 257 lb (116.6 kg).Body mass index is 47.01 kg/m.  General Appearance: Fairly Groomed  Eye Contact:  Fair  Speech:  Clear and Coherent  Volume:  Normal  Mood:  Euthymic  Affect:  Appropriate  Thought Process:  Goal Directed  Orientation:  Full (Time, Place, and Person)  Thought Content: WDL and Logical   Suicidal Thoughts:  No  Homicidal  Thoughts:  No  Memory:  Immediate;   Fair Recent;   Fair Remote;   Fair  Judgement:  Fair  Insight:  Good  Psychomotor Activity:  Normal  Concentration:  Concentration: Good and Attention Span: Fair  Recall:  [employer] of Knowledge: Good  Language: Good  Akathisia:  No  Handed:  Right  AIMS (if indicated):  0  Assets:  Communication Skills Desire for Improvement Housing Resilience  ADL's:  Intact  Cognition:  WNL  Sleep:  good   Assessment: Schizophrenia chronic paranoid type.  Anxiety disorder NOS.  Plan: Patient is a stable on her current psychiatric medication.  She has no tremors or shakes.  I will continue Paxil 40 mg daily, trazodone 200 mg at bedtime,Klonopin 1 mg at bedtime and Geodon 80 mg at bedtime.  Discussed medication side effects and benefits.  Recommended to call us back if she has any question, concern if she feels worsening of symptoms.  Follow-up in 3 months.  Miciah Shealy T., MD 04/24/2016, 3:54 PM

## 2016-05-13 DIAGNOSIS — E039 Hypothyroidism, unspecified: Secondary | ICD-10-CM | POA: Diagnosis not present

## 2016-05-13 DIAGNOSIS — N19 Unspecified kidney failure: Secondary | ICD-10-CM | POA: Diagnosis not present

## 2016-05-13 DIAGNOSIS — E119 Type 2 diabetes mellitus without complications: Secondary | ICD-10-CM | POA: Diagnosis not present

## 2016-05-13 DIAGNOSIS — I509 Heart failure, unspecified: Secondary | ICD-10-CM | POA: Diagnosis not present

## 2016-05-15 DIAGNOSIS — E039 Hypothyroidism, unspecified: Secondary | ICD-10-CM | POA: Diagnosis not present

## 2016-05-15 DIAGNOSIS — E119 Type 2 diabetes mellitus without complications: Secondary | ICD-10-CM | POA: Diagnosis not present

## 2016-05-15 DIAGNOSIS — I509 Heart failure, unspecified: Secondary | ICD-10-CM | POA: Diagnosis not present

## 2016-05-15 DIAGNOSIS — M170 Bilateral primary osteoarthritis of knee: Secondary | ICD-10-CM | POA: Diagnosis not present

## 2016-05-15 DIAGNOSIS — N19 Unspecified kidney failure: Secondary | ICD-10-CM | POA: Diagnosis not present

## 2016-05-19 ENCOUNTER — Other Ambulatory Visit: Payer: Self-pay | Admitting: Family Medicine

## 2016-05-19 DIAGNOSIS — Z1231 Encounter for screening mammogram for malignant neoplasm of breast: Secondary | ICD-10-CM

## 2016-05-20 ENCOUNTER — Other Ambulatory Visit (HOSPITAL_COMMUNITY): Payer: Self-pay | Admitting: Psychiatry

## 2016-05-20 DIAGNOSIS — F200 Paranoid schizophrenia: Secondary | ICD-10-CM

## 2016-06-02 ENCOUNTER — Encounter (HOSPITAL_COMMUNITY): Payer: Self-pay

## 2016-06-02 ENCOUNTER — Ambulatory Visit (HOSPITAL_COMMUNITY)
Admission: EM | Admit: 2016-06-02 | Discharge: 2016-06-03 | Disposition: A | Discharge status: Home / Self Care | Payer: Medicare Other | Attending: Emergency Medicine | Admitting: Emergency Medicine

## 2016-06-02 DIAGNOSIS — B3781 Candidal esophagitis: Secondary | ICD-10-CM | POA: Diagnosis not present

## 2016-06-02 DIAGNOSIS — R1310 Dysphagia, unspecified: Secondary | ICD-10-CM | POA: Diagnosis not present

## 2016-06-02 DIAGNOSIS — W44F3XA Food entering into or through a natural orifice, initial encounter: Secondary | ICD-10-CM | POA: Diagnosis present

## 2016-06-02 DIAGNOSIS — K2900 Acute gastritis without bleeding: Secondary | ICD-10-CM | POA: Insufficient documentation

## 2016-06-02 DIAGNOSIS — X58XXXA Exposure to other specified factors, initial encounter: Secondary | ICD-10-CM | POA: Diagnosis not present

## 2016-06-02 DIAGNOSIS — T18128A Food in esophagus causing other injury, initial encounter: Secondary | ICD-10-CM | POA: Diagnosis present

## 2016-06-02 DIAGNOSIS — K222 Esophageal obstruction: Secondary | ICD-10-CM | POA: Diagnosis not present

## 2016-06-02 LAB — CBC WITH DIFFERENTIAL/PLATELET
BASOS ABS: 0 10*3/uL (ref 0.0–0.1)
Basophils Relative: 0 %
EOS ABS: 0.1 10*3/uL (ref 0.0–0.7)
EOS PCT: 2 %
HCT: 38.4 % (ref 36.0–46.0)
Hemoglobin: 12.4 g/dL (ref 12.0–15.0)
Lymphocytes Relative: 32 %
Lymphs Abs: 2.8 10*3/uL (ref 0.7–4.0)
MCH: 28.1 pg (ref 26.0–34.0)
MCHC: 32.3 g/dL (ref 30.0–36.0)
MCV: 86.9 fL (ref 78.0–100.0)
MONO ABS: 0.9 10*3/uL (ref 0.1–1.0)
Monocytes Relative: 10 %
Neutro Abs: 5.1 10*3/uL (ref 1.7–7.7)
Neutrophils Relative %: 56 %
PLATELETS: 203 10*3/uL (ref 150–400)
RBC: 4.42 MIL/uL (ref 3.87–5.11)
RDW: 14 % (ref 11.5–15.5)
WBC: 9 10*3/uL (ref 4.0–10.5)

## 2016-06-02 MED ORDER — GLUCAGON HCL RDNA (DIAGNOSTIC) 1 MG IJ SOLR
1.0000 mg | Freq: Once | INTRAMUSCULAR | Status: AC
  Administered 2016-06-03: 1 mg via INTRAVENOUS
  Filled 2016-06-02: qty 1

## 2016-06-02 NOTE — ED Triage Notes (Signed)
Pt states eating this evening, feels like a piece of beef is lodged in throat. Pt states unable to pass food via water or pudding. Pt denies any chest pain or SOB. Pt states no hx of same.

## 2016-06-02 NOTE — ED Provider Notes (Addendum)
[place] DEPT Provider Note   CSN: [id number] Arrival date & time: 06/02/16  2005  By signing my name below, I, [name], attest that this documentation has been prepared under the direction and in the presence of Delora Fuel, MD. Electronically Signed: [name], Scribe. 06/02/16. 11:27 PM.   History   Chief Complaint Chief Complaint  Patient presents with  . Food stuck in throat    HPI Chelsea [name] is a 66 y.o. female with history of COPD, HTN, HLD, diabetes, and schizophrenia who presents to the ED with sensation of foreign body in throat. This patient states that approximately 4 hours ago she was eating beef stir-fry when she believes a food bolus became lodged in her esophagus. She has been unable to tolerate liquids since that time. At interview, she denies any difficulty respirating. No difficulty swallowing. No throat pain. No prior GI history.  The history is provided by the patient. No language interpreter was used.    Past Medical History:  Diagnosis Date  . Anxiety   . Arthritis   . COPD (chronic obstructive pulmonary disease) ([place])   . Diabetes mellitus type II   . Heart murmur   . Hyperlipemia   . Hypertension   . Hypothyroidism   . Mental disorder    Bipolar  . Obesity   . Shortness of breath   . Sleep apnea    uses CPAP    Patient Active Problem List   Diagnosis Date Noted  . Peripheral edema 12/04/2012  . Morbid obesity ([place]) 12/04/2012  . Type II or unspecified type diabetes mellitus without mention of complication, not stated as uncontrolled 12/03/2012  . Chest pain 12/03/2012  . Dyspnea 12/03/2012  . Abnormal mammogram with microcalcification 04/23/2012  . HYPERTENSION, UNSPECIFIED 12/27/2008  . DIZZINESS 12/27/2008  . DYSPNEA 12/27/2008    Past Surgical History:  Procedure Laterality Date  . CHOLECYSTECTOMY    . EYE SURGERY    . PARTIAL MASTECTOMY WITH NEEDLE LOCALIZATION Left 05/10/2012   Procedure: PARTIAL  MASTECTOMY WITH NEEDLE LOCALIZATION;  Surgeon: Adin Hector, MD;  Location: [facility];  Service: General;  Laterality: Left;    OB History    No data available       Home Medications    Prior to Admission medications   Medication Sig Start Date End Date Taking? Authorizing Provider  acetaminophen (TYLENOL) 325 MG tablet Take 2 tablets (650 mg total) by mouth every 6 (six) hours as needed for pain. 12/04/12   Delfina Redwood, MD  aspirin 81 MG chewable tablet Chew 81 mg by mouth daily.    Historical Provider, MD  atorvastatin (LIPITOR) 20 MG tablet  04/03/16   Historical Provider, MD  bumetanide (BUMEX) 1 MG tablet Take 1 tablet (1 mg total) by mouth daily. 12/04/12   Delfina Redwood, MD  clonazePAM (KLONOPIN) 1 MG tablet Take 1 tablet (1 mg total) by mouth at bedtime. 04/24/16   Kathlee Nations, MD  Insulin Detemir (LEVEMIR FLEXPEN) 100 UNIT/ML SOPN Inject 90 Units into the skin at bedtime.     Historical Provider, MD  Insulin Glargine (TOUJEO SOLOSTAR [place]) Inject 85 Units into the skin at bedtime.    Fanny Bien, MD  INVOKANA 300 MG TABS tablet  04/03/16   Historical Provider, MD  levothyroxine (SYNTHROID) 150 MCG tablet Take 150 mcg by mouth daily.    Fanny Bien, MD  metFORMIN (GLUCOPHAGE) 1000 MG tablet Take 1,000 mg by mouth 2 (two) times  daily with a meal.    Historical Provider, MD  PARoxetine (PAXIL) 40 MG tablet Take 1 tablet (40 mg total) by mouth every morning. 04/24/16   Kathlee Nations, MD  rOPINIRole (REQUIP) 0.25 MG tablet 1 pill nightly x 1 week, then 2 pills nightly x 1 w, then 3 pills nightly thereafter. 2 hours before bedtime. 03/17/16   Star Age, MD  traZODone (DESYREL) 100 MG tablet Take 2 tablets (200 mg total) by mouth at bedtime. 04/24/16   Kathlee Nations, MD  ziprasidone (GEODON) 80 MG capsule Take 1 capsule (80 mg total) by mouth at bedtime. 04/24/16   Kathlee Nations, MD    Family History Family History  Problem Relation Age of Onset  . Alcohol abuse Father    . Stroke Father   . Alzheimer's disease Mother     Social History Social History  Substance Use Topics  . Smoking status: Former Smoker    Packs/day: 1.50    Years: 20.00    Types: Cigarettes    Quit date: 05/03/1993  . Smokeless tobacco: Never Used  . Alcohol use No     Allergies   Darvocet [propoxyphene n-acetaminophen]; Fluoxetine; Penicillins; and Promethazine   Review of Systems Review of Systems  HENT:       No difficulty respirating.   Gastrointestinal:       Sensation of foreign body in throat.   All other systems reviewed and are negative.    Physical Exam Updated Vital Signs BP 123/80 (BP Location: Left Arm)   Pulse 98   Temp 98.2 F (36.8 C) (Oral)   Resp 18   SpO2 97%   Physical Exam  Constitutional: She is oriented to person, place, and time. She appears well-developed and well-nourished.  HENT:  Head: Normocephalic and atraumatic.  Eyes: EOM are normal. Pupils are equal, round, and reactive to light.  Neck: Normal range of motion. Neck supple. No JVD present.  Cardiovascular: Normal rate, regular rhythm and normal heart sounds.   No murmur heard. Pulmonary/Chest: Effort normal and breath sounds normal. She has no wheezes. She has no rales. She exhibits no tenderness.  Abdominal: Soft. Bowel sounds are normal. She exhibits no distension and no mass. There is no tenderness.  Musculoskeletal: Normal range of motion. She exhibits no edema.  Lymphadenopathy:    She has no cervical adenopathy.  Neurological: She is alert and oriented to person, place, and time. No cranial nerve deficit. She exhibits normal muscle tone. Coordination normal.  Skin: Skin is warm and dry. No rash noted.  Psychiatric: She has a normal mood and affect. Her behavior is normal. Judgment and thought content normal.  Nursing note and vitals reviewed.    ED Treatments / Results  DIAGNOSTIC STUDIES: Oxygen Saturation is 97 percent on room air which is normal by my  interpretation.    COORDINATION OF CARE: 11:22 PM Discussed treatment plan with pt at bedside and pt agreed to plan.  Labs (all labs ordered are listed, but only abnormal results are displayed) Labs Reviewed  BASIC METABOLIC PANEL - Abnormal; Notable for the following:       Result Value   Potassium 3.4 (*)    Chloride 98 (*)    Glucose, Bld 122 (*)    BUN 27 (*)    Creatinine, Ser 1.61 (*)    GFR calc non Af Amer 33 (*)    GFR calc Af Amer 38 (*)    All other components within normal limits  CBC WITH DIFFERENTIAL/PLATELET    Procedures Procedures (including critical care time)  Medications Ordered in ED Medications - No data to display   Initial Impression / Assessment and Plan / ED Course  I have reviewed the triage vital signs and the nursing notes.  Pertinent lab results that were available during my care of the patient were reviewed by me and considered in my medical decision making (see chart for details).  Esophageal meat impaction. Old records are reviewed, and she has no relevant past visits. She is given a dose of glucagon with no response. Case is discussed with Dr. Michail Sermon gastroenterology service who states that he will perform endoscopy on her, but will not be available to do so until 6 AM.  Final Clinical Impressions(s) / ED Diagnoses   Final diagnoses:  Esophageal obstruction due to food impaction    New Prescriptions New Prescriptions   No medications on file   I personally performed the services described in this documentation, which was scribed in my presence. The recorded information has been reviewed and is accurate.      Delora Fuel, MD [phone] [id number]    Delora Fuel, MD [id number]

## 2016-06-03 ENCOUNTER — Encounter (HOSPITAL_COMMUNITY)
Admission: EM | Disposition: A | Discharge status: Home / Self Care | Payer: Self-pay | Source: Home / Self Care | Attending: Emergency Medicine

## 2016-06-03 ENCOUNTER — Encounter (HOSPITAL_COMMUNITY): Payer: Self-pay

## 2016-06-03 DIAGNOSIS — W44F3XA Food entering into or through a natural orifice, initial encounter: Secondary | ICD-10-CM | POA: Diagnosis present

## 2016-06-03 DIAGNOSIS — B3781 Candidal esophagitis: Secondary | ICD-10-CM | POA: Diagnosis not present

## 2016-06-03 DIAGNOSIS — T18128A Food in esophagus causing other injury, initial encounter: Secondary | ICD-10-CM | POA: Diagnosis not present

## 2016-06-03 DIAGNOSIS — K222 Esophageal obstruction: Secondary | ICD-10-CM | POA: Diagnosis not present

## 2016-06-03 DIAGNOSIS — R1310 Dysphagia, unspecified: Secondary | ICD-10-CM | POA: Diagnosis not present

## 2016-06-03 HISTORY — PX: ESOPHAGOGASTRODUODENOSCOPY: SHX5428

## 2016-06-03 LAB — BASIC METABOLIC PANEL
Anion gap: 11 (ref 5–15)
BUN: 27 mg/dL — AB (ref 6–20)
CO2: 29 mmol/L (ref 22–32)
CREATININE: 1.61 mg/dL — AB (ref 0.44–1.00)
Calcium: 10.1 mg/dL (ref 8.9–10.3)
Chloride: 98 mmol/L — ABNORMAL LOW (ref 101–111)
GFR calc Af Amer: 38 mL/min — ABNORMAL LOW (ref >60)
GFR calc non Af Amer: 33 mL/min — ABNORMAL LOW (ref >60)
Glucose, Bld: 122 mg/dL — ABNORMAL HIGH (ref 65–99)
Potassium: 3.4 mmol/L — ABNORMAL LOW (ref 3.5–5.1)
SODIUM: 138 mmol/L (ref 135–145)

## 2016-06-03 SURGERY — EGD (ESOPHAGOGASTRODUODENOSCOPY)
Anesthesia: Moderate Sedation

## 2016-06-03 MED ORDER — FENTANYL CITRATE (PF) 100 MCG/2ML IJ SOLN
INTRAMUSCULAR | Status: DC | PRN
  Administered 2016-06-03 (×3): 25 ug via INTRAVENOUS

## 2016-06-03 MED ORDER — BUTAMBEN-TETRACAINE-BENZOCAINE 2-2-14 % EX AERO
INHALATION_SPRAY | CUTANEOUS | Status: DC | PRN
  Administered 2016-06-03: 2 via TOPICAL

## 2016-06-03 MED ORDER — PANTOPRAZOLE SODIUM 40 MG IV SOLR
40.0000 mg | Freq: Once | INTRAVENOUS | Status: AC
  Administered 2016-06-03: 40 mg via INTRAVENOUS
  Filled 2016-06-03: qty 40

## 2016-06-03 MED ORDER — FENTANYL CITRATE (PF) 100 MCG/2ML IJ SOLN
INTRAMUSCULAR | Status: AC
  Filled 2016-06-03: qty 2

## 2016-06-03 MED ORDER — MIDAZOLAM HCL 5 MG/ML IJ SOLN
INTRAMUSCULAR | Status: AC
  Filled 2016-06-03: qty 2

## 2016-06-03 MED ORDER — MIDAZOLAM HCL 10 MG/2ML IJ SOLN
INTRAMUSCULAR | Status: DC | PRN
  Administered 2016-06-03 (×2): 1 mg via INTRAVENOUS
  Administered 2016-06-03 (×2): 2 mg via INTRAVENOUS

## 2016-06-03 NOTE — Interval H&P Note (Signed)
History and Physical Interval Note:  06/03/2016 6:47 AM  [name]  has presented today for surgery, with the diagnosis of food impaction  The various methods of treatment have been discussed with the patient and family. After consideration of risks, benefits and other options for treatment, the patient has consented to  Procedure(s): ESOPHAGOGASTRODUODENOSCOPY (EGD) (N/A) as a surgical intervention .  The patient's history has been reviewed, patient examined, no change in status, stable for surgery.  I have reviewed the patient's chart and labs.  Questions were answered to the patient's satisfaction.     [place] C.

## 2016-06-03 NOTE — Op Note (Signed)
[facility] Patient Name: Chelsea [name] Procedure Date : 06/03/2016 MRN: [id number] Attending MD: Lear Ng , MD Date of Birth: September 20, 1950 CSN: [id number] Age: 66 Admit Type: Outpatient Procedure:                Upper GI endoscopy Indications:              Dysphagia, Foreign body in the esophagus Providers:                Lear Ng, MD, Burtis Junes, RN, Alfonso Patten,                            Technician Referring MD:              Medicines:                Fentanyl 75 micrograms IV, Midazolam 7 mg IV,                            Cetacaine spray Complications:            No immediate complications. Estimated Blood Loss:     Estimated blood loss was minimal. Estimated blood                            loss was minimal. Procedure:                Pre-Anesthesia Assessment:                           - Prior to the procedure, a History and Physical                            was performed, and patient medications and                            allergies were reviewed. The patient's tolerance of                            previous anesthesia was also reviewed. The risks                            and benefits of the procedure and the sedation                            options and risks were discussed with the patient.                            All questions were answered, and informed consent                            was obtained. Prior Anticoagulants: The patient has                            taken no previous anticoagulant or antiplatelet  agents. ASA Grade Assessment: III - A patient with                            severe systemic disease. After reviewing the risks                            and benefits, the patient was deemed in                            satisfactory condition to undergo the procedure.                           After obtaining informed consent, the endoscope was                            passed under direct  vision. Throughout the                            procedure, the patient's blood pressure, pulse, and                            oxygen saturations were monitored continuously. The                            [id number]) scope was introduced through the                            mouth, and advanced to the second part of duodenum.                            The upper GI endoscopy was accomplished without                            difficulty. The patient tolerated the procedure                            well. Scope In: Scope Out: Findings:      Food was found in the lower third of the esophagus. Removal of food was       accomplished. Part of the food was removed with the rescue net and after       removing the proximal end of this food the remaining food passed into       the stomach.      One moderate (circumferential scarring or stenosis; an endoscope may       pass) benign-appearing, intrinsic stenosis was found 38 cm from the       incisors. And was traversed. The lesion was not amenable to dilation,       and this was not attempted due to the food impaction that was cleared.       After clearance of the food bolus bleeding was noted at one end of the       distal esophageal stricture at the GEJ consistent with trauma from the       food bolus passage. This bleeding was noted prior to advancement of the  endoscope into the stomach.      Localized mild inflammation characterized by congestion (edema),       erosions, erythema and shallow ulcerations was found in the gastric       antrum and in the prepyloric region of the stomach.      The cardia and gastric fundus were normal on retroflexion.      Patchy mild mucosal changes characterized by congestion and erythema       were found in the duodenal bulb.      The second portion of the duodenum was normal.      Patchy candidiasis was found in the proximal esophagus. Impression:               - Food in the lower third of  the esophagus. Removal                            was successful.                           - Benign-appearing esophageal stenosis. Lesion not                            amenable to dilation, and not attempted.                           - Acute gastritis.                           - Mucosal changes in the duodenum.                           - Normal second portion of the duodenum.                           - Monilial esophagitis. Moderate Sedation:      Moderate (conscious) sedation was administered by the endoscopy nurse       and supervised by the endoscopist. The following parameters were       monitored: oxygen saturation, heart rate, blood pressure, and response       to care. Recommendation:           - Clear liquid diet for 2 hours and then advance to                            full liquids only today. Starting tomorrow advance                            diet further as tolerated. Chew food very well, eat                            slow, and eat small bites.                           - Use Prilosec (omeprazole) 20 mg PO daily.                           - Repeat upper endoscopy in 3  weeks to do dilation                            in the near future.                           - Nystatin suspension 100,000 units PO QID for 1                            week.                           - Post procedure medication orders were given. Procedure Code(s):        --- Professional ---                           [phone], Esophagogastroduodenoscopy, flexible,                            transoral; with removal of foreign body(s) Diagnosis Code(s):        --- Professional ---                           [id number], Food in esophagus causing other injury,                            initial encounter                           K22.2, Esophageal obstruction                           K29.00, Acute gastritis without bleeding                           R13.10, Dysphagia, unspecified                            T18.108A, Unspecified foreign body in esophagus                            causing other injury, initial encounter                           B37.81, Candidal esophagitis                           K31.89, Other diseases of stomach and duodenum CPT copyright 2016 American Medical Association. All rights reserved. The codes documented in this report are preliminary and upon coder review may  be revised to meet current compliance requirements. Lear Ng, MD 06/03/2016 7:20:52 AM This report has been signed electronically. Number of Addenda: 0

## 2016-06-03 NOTE — Progress Notes (Signed)
[facility] Gastroenterology Progress Note  Chelsea [name] 66 y.o. Jun 07, 1950   Subjective: Had sensation of food hang up last night when she was eating stir fry and sensation occurred as soon as she took her first bite of the meat. Reports intermittent solid food dysphagia but this is the first time in years and usually she can regurgitate the food or it passes down. This is the first time that she has had an EGD that needed to be done. Unable to tolerate her saliva or swallow liquids since the onset of this sensation. Denies vomiting since the food impaction occurred. ER note reviewed prior to the EGD.  Objective: Vital signs in last 24 hours: Vitals:   06/03/16 0620 06/03/16 0625  BP: 158/70 165/66  Pulse: 65 64  Resp: 10 12  Temp:    T 98.2  Physical Exam: Gen: alert, no acute distress, morbidly obese HEENT: anicteric sclera, oropharynx clear CV: RRR Chest: CTA anteriorly Abd: soft, nontender, nondistended, +BS, obese Ext: no edema  Lab Results:  Recent Labs  06/02/16 2343  NA 138  K 3.4*  CL 98*  CO2 29  GLUCOSE 122*  BUN 27*  CREATININE 1.61*  CALCIUM 10.1   No results for input(s): AST, ALT, ALKPHOS, BILITOT, PROT, ALBUMIN in the last 72 hours.  Recent Labs  06/02/16 2343  WBC 9.0  NEUTROABS 5.1  HGB 12.4  HCT 38.4  MCV 86.9  PLT 203   No results for input(s): LABPROT, INR in the last 72 hours.    Assessment/Plan: Food impaction in need of an EGD to clear the food bolus and further assess her esophagus. Bedside EGD in ER and if a stricture or ring is seen then she will need a f/u EGD in near future to dilate her esophagus.   Chelsea [name]. 06/03/2016, 6:41 AM   Pager [phone]  If no answer or after 5 PM call [phone] ID: Chelsea [name], female   DOB: [birth year], 66 y.o.   MRN: [id number]

## 2016-06-03 NOTE — H&P (View-Only) (Signed)
[facility] Gastroenterology Progress Note  Chelsea [name] 66 y.o. 11-20-1950   Subjective: Had sensation of food hang up last night when she was eating stir fry and sensation occurred as soon as she took her first bite of the meat. Reports intermittent solid food dysphagia but this is the first time in years and usually she can regurgitate the food or it passes down. This is the first time that she has had an EGD that needed to be done. Unable to tolerate her saliva or swallow liquids since the onset of this sensation. Denies vomiting since the food impaction occurred. ER note reviewed prior to the EGD.  Objective: Vital signs in last 24 hours: Vitals:   06/03/16 0620 06/03/16 0625  BP: 158/70 165/66  Pulse: 65 64  Resp: 10 12  Temp:    T 98.2  Physical Exam: Gen: alert, no acute distress, morbidly obese HEENT: anicteric sclera, oropharynx clear CV: RRR Chest: CTA anteriorly Abd: soft, nontender, nondistended, +BS, obese Ext: no edema  Lab Results:  Recent Labs  06/02/16 2343  NA 138  K 3.4*  CL 98*  CO2 29  GLUCOSE 122*  BUN 27*  CREATININE 1.61*  CALCIUM 10.1   No results for input(s): AST, ALT, ALKPHOS, BILITOT, PROT, ALBUMIN in the last 72 hours.  Recent Labs  06/02/16 2343  WBC 9.0  NEUTROABS 5.1  HGB 12.4  HCT 38.4  MCV 86.9  PLT 203   No results for input(s): LABPROT, INR in the last 72 hours.    Assessment/Plan: Food impaction in need of an EGD to clear the food bolus and further assess her esophagus. Bedside EGD in ER and if a stricture or ring is seen then she will need a f/u EGD in near future to dilate her esophagus.   Edmonson C. 06/03/2016, 6:41 AM   Pager [phone]  If no answer or after 5 PM call [phone] ID: Chelsea [name], female   DOB: October 15, 1950, 66 y.o.   MRN: [id number]

## 2016-06-03 NOTE — Discharge Instructions (Signed)
Clear liquid diet only for 2 hours and then advance to full liquids only today. Starting tomorrow advance diet further as tolerated. Chew food very well, eat slow, and eat small bites. Our office will call you to arrange a repeat endoscopy for dilation. Our office will call in a prescription for Nystatin swish and swallow to use for the next 7 days. Obtain Omeprazole over-the-counter 20 mg once a day (take 30 minutes before breakfast).

## 2016-06-03 NOTE — Brief Op Note (Signed)
Large food bolus cleared revealing a moderate distal esophageal stricture. See endpro note for details and recs. Diet instructions listed on post-procedure section. Ok to go home from a GI standpoint when her ride comes.

## 2016-06-09 ENCOUNTER — Encounter (HOSPITAL_COMMUNITY): Payer: Self-pay | Admitting: *Deleted

## 2016-06-09 ENCOUNTER — Other Ambulatory Visit: Payer: Self-pay | Admitting: Gastroenterology

## 2016-06-09 DIAGNOSIS — E119 Type 2 diabetes mellitus without complications: Secondary | ICD-10-CM | POA: Diagnosis not present

## 2016-06-09 NOTE — Progress Notes (Signed)
Pt SDW- pre-op call completed by both pt and pt sister, [name] with pt consent. Pt denies SOB and chest pain but is under the care of Dr. Einar Gip, Cardiology. Pt sister made aware to have pt take on 76 units of Levemir tonight, no Invokana and Metformin the morning of surgery, check BG every 2 hours prior to arrival to hospital and intervention for BG < 70 ( take 4 glucose tabs, recheck BG 15 minutes after taking tabs, if BG remains < 70 call the Endo unit). Pt sister made aware to have pt stop taking vitamins, fish oil and herbal medications. Do not take any NSAIDs ie: Ibuprofen, Advil, Naproxen, BC and Goody Powder or any medication containing Aspirin. Pt sister verbalized understanding of all pre-op instructions.

## 2016-06-10 ENCOUNTER — Ambulatory Visit (HOSPITAL_COMMUNITY): Payer: Medicare Other | Admitting: Anesthesiology

## 2016-06-10 ENCOUNTER — Encounter (HOSPITAL_COMMUNITY): Payer: Self-pay | Admitting: *Deleted

## 2016-06-10 ENCOUNTER — Encounter (HOSPITAL_COMMUNITY)
Admission: RE | Disposition: A | Discharge status: Home / Self Care | Payer: Self-pay | Source: Ambulatory Visit | Attending: Gastroenterology

## 2016-06-10 ENCOUNTER — Ambulatory Visit (HOSPITAL_COMMUNITY)
Admission: RE | Admit: 2016-06-10 | Discharge: 2016-06-10 | Disposition: A | Discharge status: Home / Self Care | Payer: Medicare Other | Source: Ambulatory Visit | Attending: Gastroenterology | Admitting: Gastroenterology

## 2016-06-10 DIAGNOSIS — E119 Type 2 diabetes mellitus without complications: Secondary | ICD-10-CM | POA: Insufficient documentation

## 2016-06-10 DIAGNOSIS — Z823 Family history of stroke: Secondary | ICD-10-CM | POA: Insufficient documentation

## 2016-06-10 DIAGNOSIS — I10 Essential (primary) hypertension: Secondary | ICD-10-CM | POA: Diagnosis not present

## 2016-06-10 DIAGNOSIS — K222 Esophageal obstruction: Secondary | ICD-10-CM

## 2016-06-10 DIAGNOSIS — Z9889 Other specified postprocedural states: Secondary | ICD-10-CM | POA: Insufficient documentation

## 2016-06-10 DIAGNOSIS — Z79899 Other long term (current) drug therapy: Secondary | ICD-10-CM | POA: Insufficient documentation

## 2016-06-10 DIAGNOSIS — Z7984 Long term (current) use of oral hypoglycemic drugs: Secondary | ICD-10-CM | POA: Insufficient documentation

## 2016-06-10 DIAGNOSIS — R1310 Dysphagia, unspecified: Secondary | ICD-10-CM | POA: Diagnosis not present

## 2016-06-10 DIAGNOSIS — E039 Hypothyroidism, unspecified: Secondary | ICD-10-CM | POA: Insufficient documentation

## 2016-06-10 DIAGNOSIS — Z885 Allergy status to narcotic agent status: Secondary | ICD-10-CM | POA: Insufficient documentation

## 2016-06-10 DIAGNOSIS — Z888 Allergy status to other drugs, medicaments and biological substances status: Secondary | ICD-10-CM | POA: Insufficient documentation

## 2016-06-10 DIAGNOSIS — Z7951 Long term (current) use of inhaled steroids: Secondary | ICD-10-CM | POA: Insufficient documentation

## 2016-06-10 DIAGNOSIS — F419 Anxiety disorder, unspecified: Secondary | ICD-10-CM | POA: Insufficient documentation

## 2016-06-10 DIAGNOSIS — F99 Mental disorder, not otherwise specified: Secondary | ICD-10-CM | POA: Diagnosis not present

## 2016-06-10 DIAGNOSIS — Z880 Allergy status to penicillin: Secondary | ICD-10-CM | POA: Diagnosis not present

## 2016-06-10 DIAGNOSIS — Z7982 Long term (current) use of aspirin: Secondary | ICD-10-CM | POA: Insufficient documentation

## 2016-06-10 DIAGNOSIS — R0600 Dyspnea, unspecified: Secondary | ICD-10-CM | POA: Diagnosis not present

## 2016-06-10 DIAGNOSIS — Z818 Family history of other mental and behavioral disorders: Secondary | ICD-10-CM | POA: Insufficient documentation

## 2016-06-10 DIAGNOSIS — Z811 Family history of alcohol abuse and dependence: Secondary | ICD-10-CM | POA: Diagnosis not present

## 2016-06-10 DIAGNOSIS — Z9049 Acquired absence of other specified parts of digestive tract: Secondary | ICD-10-CM | POA: Diagnosis not present

## 2016-06-10 DIAGNOSIS — G4730 Sleep apnea, unspecified: Secondary | ICD-10-CM | POA: Insufficient documentation

## 2016-06-10 DIAGNOSIS — M1990 Unspecified osteoarthritis, unspecified site: Secondary | ICD-10-CM | POA: Diagnosis not present

## 2016-06-10 DIAGNOSIS — J449 Chronic obstructive pulmonary disease, unspecified: Secondary | ICD-10-CM | POA: Diagnosis not present

## 2016-06-10 DIAGNOSIS — E785 Hyperlipidemia, unspecified: Secondary | ICD-10-CM | POA: Diagnosis not present

## 2016-06-10 DIAGNOSIS — Z87891 Personal history of nicotine dependence: Secondary | ICD-10-CM | POA: Insufficient documentation

## 2016-06-10 DIAGNOSIS — Z9010 Acquired absence of unspecified breast and nipple: Secondary | ICD-10-CM | POA: Insufficient documentation

## 2016-06-10 DIAGNOSIS — R079 Chest pain, unspecified: Secondary | ICD-10-CM | POA: Diagnosis not present

## 2016-06-10 HISTORY — PX: ESOPHAGOGASTRODUODENOSCOPY: SHX5428

## 2016-06-10 HISTORY — PX: BALLOON DILATION: SHX5330

## 2016-06-10 HISTORY — DX: Idiopathic sleep related nonobstructive alveolar hypoventilation: G47.34

## 2016-06-10 HISTORY — DX: Schizophrenia, unspecified: F20.9

## 2016-06-10 HISTORY — DX: Gastro-esophageal reflux disease without esophagitis: K21.9

## 2016-06-10 HISTORY — DX: Esophageal obstruction: K22.2

## 2016-06-10 HISTORY — DX: Bipolar disorder, unspecified: F31.9

## 2016-06-10 LAB — GLUCOSE, CAPILLARY: Glucose-Capillary: 122 mg/dL — ABNORMAL HIGH (ref 65–99)

## 2016-06-10 SURGERY — EGD (ESOPHAGOGASTRODUODENOSCOPY)
Anesthesia: Monitor Anesthesia Care

## 2016-06-10 MED ORDER — PROPOFOL 500 MG/50ML IV EMUL
INTRAVENOUS | Status: DC | PRN
  Administered 2016-06-10: 100 ug/kg/min via INTRAVENOUS

## 2016-06-10 MED ORDER — LACTATED RINGERS IV SOLN
INTRAVENOUS | Status: DC | PRN
  Administered 2016-06-10: 14:00:00 via INTRAVENOUS

## 2016-06-10 NOTE — Anesthesia Postprocedure Evaluation (Signed)
Anesthesia Post Note  Patient: Chelsea [name]  Procedure(s) Performed: Procedure(s) (LRB): ESOPHAGOGASTRODUODENOSCOPY (EGD) (N/A) BALLOON DILATION (N/A)  Patient location during evaluation: PACU Anesthesia Type: MAC Level of consciousness: awake and alert Pain management: pain level controlled Vital Signs Assessment: post-procedure vital signs reviewed and stable Respiratory status: spontaneous breathing, nonlabored ventilation and respiratory function stable Cardiovascular status: stable and blood pressure returned to baseline Anesthetic complications: no       Last Vitals:  Vitals:   06/10/16 1445 06/10/16 1455  BP: (!) 145/69 (!) 159/79  Pulse: 67 62  Resp: 16 15  Temp:      Last Pain:  Vitals:   06/10/16 1435  TempSrc: Oral                 Caitriona Sundquist,W. EDMOND

## 2016-06-10 NOTE — H&P (Signed)
Date of Initial H&P: 06/09/16  History reviewed, patient examined, no change in status, stable for surgery.

## 2016-06-10 NOTE — Discharge Instructions (Addendum)
YOU HAD AN ENDOSCOPIC PROCEDURE TODAY: Refer to the procedure report and other information in the discharge instructions given to you for any specific questions about what was found during the examination. If this information does not answer your questions, please call [facility] office at [phone] to clarify.   YOU SHOULD EXPECT: Some feelings of bloating in the abdomen. Passage of more gas than usual. Walking can help get rid of the air that was put into your GI tract during the procedure and reduce the bloating. If you had a lower endoscopy (such as a colonoscopy or flexible sigmoidoscopy) you may notice spotting of blood in your stool or on the toilet paper. Some abdominal soreness may be present for a day or two, also.  DIET: Your first meal following the procedure should be a light meal and then it is ok to progress to your normal diet. A half-sandwich or bowl of soup is an example of a good first meal. Heavy or fried foods are harder to digest and may make you feel nauseous or bloated. Drink plenty of fluids but you should avoid alcoholic beverages for 24 hours. If you had a esophageal dilation, please see attached instructions for diet.   ACTIVITY: Your care partner should take you home directly after the procedure. You should plan to take it easy, moving slowly for the rest of the day. You can resume normal activity the day after the procedure however YOU SHOULD NOT DRIVE, use power tools, machinery or perform tasks that involve climbing or major physical exertion for 24 hours (because of the sedation medicines used during the test).   SYMPTOMS TO REPORT IMMEDIATELY: A gastroenterologist can be reached at any hour. Please call [phone]  for any of the following symptoms:  Following upper endoscopy (EGD, EUS, ERCP, esophageal dilation) Vomiting of blood or coffee ground material  New, significant abdominal pain  New, significant chest pain or pain under the shoulder blades  Painful or  persistently difficult swallowing  New shortness of breath  Black, tarry-looking or red, bloody stools  FOLLOW UP:  If any biopsies were taken you will be contacted by phone or by letter within the next 1-3 weeks. Call [phone]  if you have not heard about the biopsies in 3 weeks.  Please also call with any specific questions about appointments or follow up tests.   Esophageal Dilatation Esophageal dilatation is a procedure to open a blocked or narrowed part of the esophagus. The esophagus is the long tube in your throat that carries food and liquid from your mouth to your stomach. The procedure is also called esophageal dilation. You may need this procedure if you have a buildup of scar tissue in your esophagus that makes it difficult, painful, or even impossible to swallow. This can be caused by gastroesophageal reflux disease (GERD). In rare cases, people need this procedure because they have cancer of the esophagus or a problem with the way food moves through the esophagus. Sometimes you may need to have another dilatation to enlarge the opening of the esophagus gradually. Tell a health care provider about:  Any allergies you have.  All medicines you are taking, including vitamins, herbs, eye drops, creams, and over-the-counter medicines.  Any problems you or family members have had with anesthetic medicines.  Any blood disorders you have.  Any surgeries you have had.  Any medical conditions you have.  Any antibiotic medicines you are required to take before dental procedures. What are the risks? Generally, this is  a safe procedure. However, problems can occur and include:  Bleeding from a tear in the lining of the esophagus.  A hole (perforation) in the esophagus. What happens before the procedure?  Do not eat or drink anything after midnight on the night before the procedure or as directed by your health care provider.  Ask your health care provider about changing or  stopping your regular medicines. This is especially important if you are taking diabetes medicines or blood thinners.  Plan to have someone take you home after the procedure. What happens during the procedure?  You will be given a medicine that makes you relaxed and sleepy (sedative).  A medicine may be sprayed or gargled to numb the back of the throat.  Your health care provider can use various instruments to do an esophageal dilatation. During the procedure, the instrument used will be placed in your mouth and passed down into your esophagus. Options include:  Simple dilators. This instrument is carefully placed in the esophagus to stretch it.  Guided wire bougies. In this method, a flexible tube (endoscope) is used to insert a wire into the esophagus. The dilator is passed over this wire to enlarge the esophagus. Then the wire is removed.  Balloon dilators. An endoscope with a small balloon at the end is passed down into the esophagus. Inflating the balloon gently stretches the esophagus and opens it up. What happens after the procedure?  Your blood pressure, heart rate, breathing rate, and blood oxygen level will be monitored often until the medicines you were given have worn off.  Your throat may feel slightly sore and will probably still feel numb. This will improve slowly over time.  You will not be allowed to eat or drink until the throat numbness has resolved.  If this is a same-day procedure, you may be allowed to go home once you have been able to drink, urinate, and sit on the edge of the bed without nausea or dizziness.  If this is a same-day procedure, you should have a friend or family member with you for the next 24 hours after the procedure. This information is not intended to replace advice given to you by your health care provider. Make sure you discuss any questions you have with your health care provider. Document Released: 05/08/2005 Document Revised: 08/23/2015  Document Reviewed: 07/27/2013 Elsevier Interactive Patient Education  2017 [place] your food really well; take small bites and eat slow.  START Omeprazole (Prilosec) 20 mg once a day (obtain over-the-counter) for the next 4 weeks.

## 2016-06-10 NOTE — Anesthesia Preprocedure Evaluation (Addendum)
Anesthesia Evaluation  Patient identified by MRN, date of birth, ID band Patient awake    Reviewed: Allergy & Precautions, NPO status , Patient's Chart, lab work & pertinent test results  Airway Mallampati: II  TM Distance: >3 FB     Dental  (+) Missing, Chipped, Lower Dentures, Upper Dentures,    Pulmonary former smoker,    breath sounds clear to auscultation       Cardiovascular hypertension,  Rhythm:Regular Rate:Normal     Neuro/Psych    GI/Hepatic   Endo/Other  diabetes  Renal/GU      Musculoskeletal   Abdominal (+) + obese,   Peds  Hematology   Anesthesia Other Findings   Reproductive/Obstetrics                            Anesthesia Physical Anesthesia Plan  ASA: III  Anesthesia Plan: MAC   Post-op Pain Management:    Induction: Intravenous  Airway Management Planned: Natural Airway and Nasal Cannula  Additional Equipment:   Intra-op Plan:   Post-operative Plan:   Informed Consent: I have reviewed the patients History and Physical, chart, labs and discussed the procedure including the risks, benefits and alternatives for the proposed anesthesia with the patient or authorized representative who has indicated his/her understanding and acceptance.     Plan Discussed with: CRNA and Anesthesiologist  Anesthesia Plan Comments: (Htn Type 2 DM glucose 122 Schizophrenia per records S/P food impaction 3/10  Plan MAC)        Anesthesia Quick Evaluation

## 2016-06-10 NOTE — Op Note (Addendum)
[facility] Patient Name: Chelsea [name] Procedure Date : 06/10/2016 MRN: [id number] Attending MD: Lear Ng , MD Date of Birth: 02-Mar-1951 CSN: [id number] Age: 66 Admit Type: Outpatient Procedure:                Upper GI endoscopy Indications:              Dysphagia, Stenosis of the esophagus Providers:                Lear Ng, MD, Vista Lawman, RN, Cherylynn Ridges, Technician, Lavona Mound, CRNA Referring MD:              Medicines:                Propofol per Anesthesia, Monitored Anesthesia Care Complications:            No immediate complications. Estimated Blood Loss:     Estimated blood loss: none. Procedure:                Pre-Anesthesia Assessment:                           - Prior to the procedure, a History and Physical                            was performed, and patient medications and                            allergies were reviewed. The patient's tolerance of                            previous anesthesia was also reviewed. The risks                            and benefits of the procedure and the sedation                            options and risks were discussed with the patient.                            All questions were answered, and informed consent                            was obtained. Prior Anticoagulants: The patient has                            taken no previous anticoagulant or antiplatelet                            agents. ASA Grade Assessment: III - A patient with                            severe systemic disease. After reviewing the risks  and benefits, the patient was deemed in                            satisfactory condition to undergo the procedure.                           After obtaining informed consent, the endoscope was                            passed under direct vision. Throughout the                            procedure, the patient's blood  pressure, pulse, and                            oxygen saturations were monitored continuously. The                            EG-[id number] ([id number]) scope was introduced through the                            mouth, and advanced to the second part of duodenum.                            The upper GI endoscopy was accomplished without                            difficulty. The patient tolerated the procedure                            well. Scope In: Scope Out: Findings:      A low-grade of narrowing and non-obstructing Schatzki ring (acquired)       was found at the gastroesophageal junction. A TTS dilator was passed       through the scope. Dilation with an 18-19-20 mm balloon dilator was       performed to 20 mm. The dilation site was examined and showed no change       and no bleeding, mucosal tear or perforation.      The entire examined stomach was normal.      The cardia and gastric fundus were normal on retroflexion.      The examined duodenum was normal. Impression:               - Low-grade of narrowing and non-obstructing                            Schatzki ring. Dilated.                           - Normal stomach.                           - Normal examined duodenum.                           - No specimens collected. Moderate  Sedation:      N/A - MAC procedure Recommendation:           - Use Prilosec (omeprazole) 20 mg PO daily.                           - Return to my office in 2 months.                           - Patient has a contact number available for                            emergencies. The signs and symptoms of potential                            delayed complications were discussed with the                            patient. Return to normal activities tomorrow.                            Written discharge instructions were provided to the                            patient.                           - Resume previous diet.                           -  Continue present medications. Procedure Code(s):        --- Professional ---                           [phone], Esophagogastroduodenoscopy, flexible,                            transoral; with transendoscopic balloon dilation of                            esophagus (less than 30 mm diameter) Diagnosis Code(s):        --- Professional ---                           R13.10, Dysphagia, unspecified                           K22.2, Esophageal obstruction CPT copyright 2016 American Medical Association. All rights reserved. The codes documented in this report are preliminary and upon coder review may  be revised to meet current compliance requirements. Lear Ng, MD 06/10/2016 2:36:50 PM This report has been signed electronically. Number of Addenda: 0

## 2016-06-10 NOTE — Transfer of Care (Signed)
Immediate Anesthesia Transfer of Care Note  Patient: Chelsea MCMANIGAL  Procedure(s) Performed: Procedure(s): ESOPHAGOGASTRODUODENOSCOPY (EGD) (N/A) BALLOON DILATION (N/A) SAVORY DILATION (N/A)  Patient Location: Endoscopy Unit  Anesthesia Type:MAC  Level of Consciousness: awake, alert  and oriented  Airway & Oxygen Therapy: Patient Spontanous Breathing and Patient connected to nasal cannula oxygen  Post-op Assessment: Report given to RN, Post -op Vital signs reviewed and stable and Patient moving all extremities  Post vital signs: Reviewed and stable  Last Vitals:  Vitals:   06/10/16 1238 06/10/16 1435  BP: (!) 153/61 120/63  Pulse: 62 75  Resp: 10 14  Temp: 36.4 C 36.7 C    Last Pain:  Vitals:   06/10/16 1435  TempSrc: Oral         Complications: No apparent anesthesia complications

## 2016-06-10 NOTE — Addendum Note (Signed)
Addended by: Wilford Corner on: 06/10/2016 12:53 PM   Modules accepted: Orders

## 2016-06-10 NOTE — Interval H&P Note (Signed)
History and Physical Interval Note:  06/10/2016 2:04 PM  Chelsea [name]  has presented today for surgery, with the diagnosis of esophageal stricture  The various methods of treatment have been discussed with the patient and family. After consideration of risks, benefits and other options for treatment, the patient has consented to  Procedure(s): ESOPHAGOGASTRODUODENOSCOPY (EGD) (N/A) BALLOON DILATION (N/A) SAVORY DILATION (N/A) as a surgical intervention .  The patient's history has been reviewed, patient examined, no change in status, stable for surgery.  I have reviewed the patient's chart and labs.  Questions were answered to the patient's satisfaction.     [place] C.

## 2016-06-10 NOTE — Anesthesia Procedure Notes (Signed)
Procedure Name: MAC Date/Time: 06/10/2016 2:09 PM Performed by: Rush Farmer E Pre-anesthesia Checklist: Patient identified, Emergency Drugs available, Suction available and Patient being monitored Patient Re-evaluated:Patient Re-evaluated prior to inductionOxygen Delivery Method: Nasal cannula Intubation Type: IV induction Placement Confirmation: positive ETCO2 Dental Injury: Teeth and Oropharynx as per pre-operative assessment

## 2016-06-11 ENCOUNTER — Encounter (HOSPITAL_COMMUNITY): Payer: Self-pay | Admitting: Gastroenterology

## 2016-06-11 DIAGNOSIS — E039 Hypothyroidism, unspecified: Secondary | ICD-10-CM | POA: Diagnosis not present

## 2016-06-11 DIAGNOSIS — E119 Type 2 diabetes mellitus without complications: Secondary | ICD-10-CM | POA: Diagnosis not present

## 2016-06-11 DIAGNOSIS — I10 Essential (primary) hypertension: Secondary | ICD-10-CM | POA: Diagnosis not present

## 2016-06-18 ENCOUNTER — Ambulatory Visit
Admission: RE | Admit: 2016-06-18 | Discharge: 2016-06-18 | Disposition: A | Discharge status: Home / Self Care | Payer: Medicare Other | Source: Ambulatory Visit | Attending: Family Medicine | Admitting: Family Medicine

## 2016-06-18 DIAGNOSIS — Z1231 Encounter for screening mammogram for malignant neoplasm of breast: Secondary | ICD-10-CM

## 2016-07-21 ENCOUNTER — Other Ambulatory Visit (HOSPITAL_COMMUNITY): Payer: Self-pay | Admitting: Psychiatry

## 2016-07-21 DIAGNOSIS — F200 Paranoid schizophrenia: Secondary | ICD-10-CM

## 2016-07-22 ENCOUNTER — Ambulatory Visit (INDEPENDENT_AMBULATORY_CARE_PROVIDER_SITE_OTHER): Payer: Medicare Other | Admitting: Neurology

## 2016-07-22 ENCOUNTER — Encounter: Payer: Self-pay | Admitting: Neurology

## 2016-07-22 VITALS — BP 127/74 | HR 77 | Ht 62.0 in | Wt 244.0 lb

## 2016-07-22 DIAGNOSIS — G4761 Periodic limb movement disorder: Secondary | ICD-10-CM

## 2016-07-22 DIAGNOSIS — G2581 Restless legs syndrome: Secondary | ICD-10-CM

## 2016-07-22 MED ORDER — ROPINIROLE HCL 0.25 MG PO TABS: 0.7500 mg | ORAL_TABLET | Freq: Every day | ORAL | 5 refills | Status: DC

## 2016-07-22 NOTE — Progress Notes (Signed)
Subjective:    Patient ID: Chelsea Chelsea [name] Chelsea [name] is a 66 y.o. female.  HPI     Interim history:   Chelsea Chelsea [name] Chelsea [name] is a 66 year old right-handed woman with a complex medical history of hypothyroidism, hypertension, type 2 diabetes, hyperlipidemia, morbid obesity, mood disorder, (incl. schizophrenia for which she is followed by mental health), Vitamin D deficiency, allergic rhinitis, osteoarthritis, who presents for follow-up consultation of Chelsea Chelsea [name] sleep disturbance, including mild restless leg syndrome and evidence of PLMs during Chelsea Chelsea [name] sleep study. The patient is accompanied by Chelsea Chelsea [name] brother in law today. I last saw Chelsea Chelsea [name] on 03/17/2016, at which time she reported residual restless leg symptoms. I suggested she start a trial of Requip with gradual titration.  Today, 07/22/2016 (all dictated new, as well as above notes, some dictation done in note pad or Word, outside of chart, may appear as copied):  She reports doing well with Requip. She feels that is has helped. She takes 0.25 mg strength 3 pills at night. She has no side effects including no significant daytime somnolence, nausea or vomiting, hallucinations, or impulse control disorder. She brings in a copy of Chelsea Chelsea [name] latest blood work from Chelsea Chelsea [name] primary care physician. This was blood work from 05/13/2016. A1c was 7.8 which is slightly increased, otherwise CMP was normal, TSH normal.  She went to the emergency room in March secondary to food stuck in Chelsea Chelsea [name] throat. She had esophageal dilatation at the time.  The patient's allergies, current medications, family history, past medical history, past social history, past surgical history and problem list were reviewed and updated as appropriate.   Previously (copied from previous notes for reference):   I saw Chelsea Chelsea [name] on 09/12/2015 after Chelsea Chelsea [name] recent sleep study but talked about Chelsea Chelsea [name] results at the time. She reported no new issues. She had some intermittent restless legs symptoms, but we mutually agreed to monitor. She was on  supplemental oxygen at night which I asked Chelsea Chelsea [name] to continue as she did not have any overt obstructive sleep apnea type changes during sleep study but lower trending oxygen saturations. Supplemental oxygen was not utilized for the study. She was trying to lose weight and lost a few pounds. I suggested a six-month recheck.   I first met Chelsea Chelsea [name] on 07/23/2015 at the request of Chelsea Chelsea [name] primary care physician, at which time patient reported a prior diagnosis of obstructive sleep apnea, on 8-10 years ago but she was no longer on CPAP therapy and was using oxygen at night. I invited Chelsea Chelsea [name] back for sleep study. She had a diagnostic sleep study on 08/12/2015. I went over Chelsea Chelsea [name] test results with Chelsea Chelsea [name] in detail today. Sleep efficiency was reduced at 82.7% with a latency to sleep of 28 minutes and wake after sleep onset of 57 minutes with mild to moderate sleep fragmentation noted. She had an elevated arousal index. She had an increased percentage of light stage sleep, absence of slow-wave sleep and a decreased percentage of REM sleep with a prolonged REM latency. She had moderate PLMS with an index of 34 per hour, resulting in 8 arousals per hour. She hadn't moderate snoring. AHI was 3.1 per hour in total, rising to 20 per hour during REM sleep. Average oxygen desaturation was 87%, nadir was 77%. Time below 90% saturation was 6 hours and 42 minutes, time below 88% saturation was 6 hours and 22 minutes. Study was conducted without supplemental oxygen, she uses oxygen at home. She was advised to continue with Chelsea Chelsea [name] home oxygen therapy.   07/23/2015: She was previously diagnosed with  obstructive sleep apnea several years ago, maybe 8-10 years ago, but is no longer using CPAP. She uses oxygen at night. Prior sleep study results are not available for my review today. I reviewed your office note from 07/05/2015, which you kindly included. She had blood work on 07/05/15 , which per Chelsea [name], were okay. She also has had symptoms of restless legs  and sometimes has to walk around.   She has nocturia, about 2-3 times a night. She has occasional morning headaches, about 3 times a week, does not usually take medication.   She does not watch TV in bed.   She goes to bed around 8 PM and takes several medications at night. Rise time is around 6:30 AM. She has a cat, that sleeps in Chelsea Chelsea [name] bed.   She is divorced, no children.   She had an upper respiratory infection about 4 months ago and was placed on oxygen at the time. She currently uses this at night, 2 L/m. She has an appointment with Dr. Halford Chelsea [name] in pulmonology pending for June of this year. She has quit smoking many years ago, 1995. She quit drinking alcohol in 1990 and had a history of excessive alcohol use. Chelsea Chelsea [name] father was an alcoholic. She has 1 Chelsea [name], Chelsea Chelsea [name] Chelsea [name], and patient lives with Chelsea Chelsea [name] Chelsea [name] and Chelsea [name]'s husband. She has no biological children. She is retired from housekeeping at the hospital. She has a high school education. She is divorced. She drinks caffeine in the form of coffee, 2 cups per day and occasional diet sodas. She moves a lot in Chelsea Chelsea [name] sleep. She would be willing to try CPAP again. Chelsea Chelsea [name] main issue with CPAP was uncomfortable mask and pressure. She had a CPAP titration study on 07/01/2009 which I was able to review through your records: She had a titration up to 15 cm. Chelsea Chelsea [name] AHI was reduced according to the report, sleep efficiency 97%, REM latency 209 minutes. She had no slow-wave sleep. A baseline sleep study report was not available for my review and was also not mentioned in results in the CPAP titration study which was interpreted by Dr. Brandon Chelsea [name].   She had a tonsillectomy as a child. Chelsea Chelsea [name] Epworth sleepiness score is 6 out of 24 today, Chelsea Chelsea [name] fatigue score is 60 out of 63. She sleeps with the head of bed elevated at 45 for perforans. She feels she can breathe better at night like that. She has a hospital bed.  Chelsea Chelsea [name] Past Medical History Is Significant For: Past Medical History:  Diagnosis Date   . Anxiety   . Arthritis   . Bipolar disorder ([place])   . COPD (chronic obstructive pulmonary disease) ([place])   . Diabetes mellitus type II   . Esophageal stricture    moderate distal  . GERD (gastroesophageal reflux disease)   . Heart murmur   . Hyperlipemia   . Hypertension   . Hypothyroidism   . Mental disorder    Bipolar  . Obesity   . Oxygen desaturation during sleep    wears 2 liters at bedtime   . Schizophrenia ([place])   . Shortness of breath   . Sleep apnea    wears 2 liters of oxygen at bedtime instead of CPAP    Chelsea Chelsea [name] Past Surgical History Is Significant For: Past Surgical History:  Procedure Laterality Date  . BALLOON DILATION N/A 06/10/2016   Procedure: BALLOON DILATION;  Surgeon: Wilford Corner, MD;  Location: [facility] ENDOSCOPY;  Service: Endoscopy;  Laterality: N/A;  . BREAST EXCISIONAL BIOPSY Left 05/10/2012  benign  . CHOLECYSTECTOMY    . ESOPHAGOGASTRODUODENOSCOPY N/A 06/03/2016   Procedure: ESOPHAGOGASTRODUODENOSCOPY (EGD);  Surgeon: Wilford Corner, MD;  Location: [facility] ENDOSCOPY;  Service: Endoscopy;  Laterality: N/A;  . ESOPHAGOGASTRODUODENOSCOPY N/A 06/10/2016   Procedure: ESOPHAGOGASTRODUODENOSCOPY (EGD);  Surgeon: Wilford Corner, MD;  Location: [facility] ENDOSCOPY;  Service: Endoscopy;  Laterality: N/A;  . EYE SURGERY    . PARTIAL MASTECTOMY WITH NEEDLE LOCALIZATION Left 05/10/2012   Procedure: PARTIAL MASTECTOMY WITH NEEDLE LOCALIZATION;  Surgeon: Adin Hector, MD;  Location: [facility];  Service: General;  Laterality: Left;  . TONSILLECTOMY      Chelsea Chelsea [name] Family History Is Significant For: Family History  Problem Relation Age of Onset  . Alcohol abuse Father   . Stroke Father   . Alzheimer's disease Mother     Chelsea Chelsea [name] Social History Is Significant For: Social History   Social History  . Marital status: Divorced    Spouse name: N/A  . Number of children: 0  . Years of education: HS   Occupational History  . Retired     Social History Main Topics  . Smoking  status: Former Smoker    Packs/day: 1.50    Years: 20.00    Types: Cigarettes    Quit date: 05/03/1993  . Smokeless tobacco: Never Used  . Alcohol use No  . Drug use: No  . Sexual activity: Not Currently   Other Topics Concern  . None   Social History Narrative   Drinks 2 cups of coffee a day    Chelsea Chelsea [name] Allergies Are:  Allergies  Allergen Reactions  . Darvocet [Propoxyphene N-Acetaminophen] Anaphylaxis    Can take plain tylenol  . Fluoxetine Other (See Comments)    Hallucinations   . Penicillins     REACTION: rash  . Promethazine Hives  :   Chelsea Chelsea [name] Current Medications Are:  Outpatient Encounter Prescriptions as of 07/22/2016  Medication Sig  . acetaminophen (TYLENOL) 325 MG tablet Take 2 tablets (650 mg total) by mouth every 6 (six) hours as needed for pain.  Marland Kitchen aspirin 81 MG chewable tablet Chew 81 mg by mouth daily.  Marland Kitchen atorvastatin (LIPITOR) 20 MG tablet Take 20 mg by mouth daily.   . bumetanide (BUMEX) 1 MG tablet Take 1 tablet (1 mg total) by mouth daily.  . clonazePAM (KLONOPIN) 1 MG tablet Take 1 tablet (1 mg total) by mouth at bedtime.  . fluticasone furoate-vilanterol (BREO ELLIPTA) 200-25 MCG/INH AEPB Inhale 1 puff into the lungs every morning.  . Insulin Detemir (LEVEMIR FLEXPEN) 100 UNIT/ML SOPN Inject 95 Units into the skin at bedtime.   . INVOKANA 300 MG TABS tablet Take 300 mg by mouth daily before breakfast.   . levothyroxine (SYNTHROID) 150 MCG tablet Take 150 mcg by mouth daily.  . metFORMIN (GLUCOPHAGE) 1000 MG tablet Take 500 mg by mouth 2 (two) times daily with a meal.   . PARoxetine (PAXIL) 40 MG tablet Take 1 tablet (40 mg total) by mouth every morning.  Marland Kitchen rOPINIRole (REQUIP) 0.25 MG tablet 1 pill nightly x 1 week, then 2 pills nightly x 1 w, then 3 pills nightly thereafter. 2 hours before bedtime. (Patient taking differently: Take 0.75 mg by mouth at bedtime. )  . traZODone (DESYREL) 100 MG tablet Take 2 tablets (200 mg total) by mouth at bedtime.  .  ziprasidone (GEODON) 80 MG capsule Take 1 capsule (80 mg total) by mouth at bedtime.   No facility-administered encounter medications on file as of 07/22/2016.   :  Review  of Systems:  Out of a complete 14 point review of systems, all are reviewed and negative with the exception of these symptoms as listed below: Review of Systems  Neurological:       Pt presents today to discuss Chelsea Chelsea [name] RLS. Pt says that Chelsea Chelsea [name] RLS is doing better and is taking requip 0.67m at bedtime. Pt brought a copy of Chelsea Chelsea [name] recent labwork.    Objective:  Neurologic Exam  Physical Exam Physical Examination:   Vitals:   07/22/16 1517  BP: 127/74  Pulse: 77   General Examination: The patient is a very pleasant 66y.o. female in no acute distress. She appears well-developed and well-nourished and adequately groomed.   HEENT: Normocephalic, atraumatic, pupils are equal, round and reactive to light and accommodation. Extraocular tracking is Slightly difficult for Chelsea Chelsea [name]. Face is symmetric. She has no hypophonia, no significant speech issues, mouth is moderately dry, affecting his speech. She has no lip, neck or jaw tremor. Oropharynx exam reveals: moderate mouth dryness, adequate dental hygiene and moderate airway crowding, due to smaller airway entry and redundant soft palate. Mallampati is class II. Tongue protrudes centrally and palate elevates symmetrically. Tonsils are absent.   Chest: Clear to auscultation without wheezing, rhonchi or crackles noted.  Heart: S1+S2+0, regular and normal without murmurs, rubs or gallops noted.   Abdomen: Soft, non-tender and non-distended with normal bowel sounds appreciated on auscultation.  Extremities: There is no pitting edema in the distal lower extremities bilaterally. Pedal pulses are intact.  Skin: Warm and dry without trophic changes noted. There are no varicose veins.  Musculoskeletal: exam reveals no obvious joint deformities, tenderness or joint swelling or erythema,  with the exception of bilateral mild knee discomfort. She also reports having intermittent discomfort in the lower back, sometimes radiating to the left but not currently bothersome.  Neurologically:  Mental status: The patient is awake, alert and oriented in all 4 spheres. Chelsea Chelsea [name] immediate and remote memory, attention, language skills and fund of knowledge are fairly appropriate. Mood is constricted and affect is blunted. There is mild psychomotor retardation, stable. Cranial nerves II - XII are as described above under HEENT exam. In addition: shoulder shrug is normal with equal shoulder height noted. Motor exam: Normal bulk, strength and tone is noted. There is no drift, tremor or rebound. Romberg is negative. Reflexes are 1+ throughout. Fine motor skills and coordination: intact, slightly slow.  Cerebellar testing: No dysmetria or intention tremor. There is no truncal or gait ataxia.  Sensory exam: intact to light touch in the UEs and LEs.  Gait, station and balance: She stands with mild difficulty. No veering to one side is noted. No leaning to one side is noted. Posture is age-appropriate and stance is narrow based. Gait shows slow and cautious gait, tandem walk is not possible.    Assessment and Plan:  In summary, NTAYLR MEUTHis a very pleasant 66year old female with an underlying complex medical history of hypothyroidism, hypertension, type 2 diabetes, hyperlipidemia, morbid obesity, mood disorder, including paranoid schizophrenia for which [facility] vitamin D deficiency, allergic rhinitis, osteoarthritis, who presents for follow-up consultation of Chelsea Chelsea [name] restless leg symptoms and periodic leg movements. Chelsea Chelsea [name] baseline sleep study from 08/12/2015 showed no significant obstructive sleep apnea but she had lower trending oxygen saturations at night, the study was done without supplemental oxygen.  she was started on ropinirole with gradual titration in December 2017 and  indicates good results. She is encouraged to continue with  it at 0.75 mg each night. I renewed Chelsea Chelsea [name] prescription for 90 days supply. She reports no side effects including no swelling, no severe sleepiness, hallucinations or nausea or vomiting. She endorses no signs of impulse control disorder.  I  suggested a 4 month checkup, sooner as needed. Chelsea Chelsea [name] exam is otherwise stable. She is encouraged to try to lose weight and consider using a cane for gait safety especially since she has significant joint pain in both knees. I answered all their questions today and the patient and Chelsea Chelsea [name] Chelsea [name] were in agreement with the plan

## 2016-07-22 NOTE — Patient Instructions (Signed)
For your leg movements and restless legs, we will continue with your Requip (generic name: ropinirole) 0.25 mg: Take 3 pills at night. Common side effects reported are: Sedation, sleepiness, nausea, vomiting, and rare side effects are confusion, hallucinations, swelling in legs, and abnormal behaviors, including impulse control problems, which can manifest as excessive eating, obsessions with food or gambling, or hypersexuality.  We can see you in 6 months, you can see one of our nurse practitioners as you are stable. I will see you after that.

## 2016-07-23 DIAGNOSIS — Z139 Encounter for screening, unspecified: Secondary | ICD-10-CM | POA: Diagnosis not present

## 2016-07-23 DIAGNOSIS — Z23 Encounter for immunization: Secondary | ICD-10-CM | POA: Diagnosis not present

## 2016-07-23 DIAGNOSIS — E119 Type 2 diabetes mellitus without complications: Secondary | ICD-10-CM | POA: Diagnosis not present

## 2016-07-24 ENCOUNTER — Ambulatory Visit (INDEPENDENT_AMBULATORY_CARE_PROVIDER_SITE_OTHER): Payer: Medicare Other | Admitting: Psychiatry

## 2016-07-24 ENCOUNTER — Encounter (HOSPITAL_COMMUNITY): Payer: Self-pay | Admitting: Psychiatry

## 2016-07-24 DIAGNOSIS — Z794 Long term (current) use of insulin: Secondary | ICD-10-CM

## 2016-07-24 DIAGNOSIS — Z79899 Other long term (current) drug therapy: Secondary | ICD-10-CM

## 2016-07-24 DIAGNOSIS — Z810 Family history of intellectual disabilities: Secondary | ICD-10-CM

## 2016-07-24 DIAGNOSIS — Z811 Family history of alcohol abuse and dependence: Secondary | ICD-10-CM

## 2016-07-24 DIAGNOSIS — F200 Paranoid schizophrenia: Secondary | ICD-10-CM | POA: Diagnosis not present

## 2016-07-24 DIAGNOSIS — F419 Anxiety disorder, unspecified: Secondary | ICD-10-CM | POA: Diagnosis not present

## 2016-07-24 DIAGNOSIS — Z87891 Personal history of nicotine dependence: Secondary | ICD-10-CM | POA: Diagnosis not present

## 2016-07-24 MED ORDER — PAROXETINE HCL 40 MG PO TABS: 40.0000 mg | ORAL_TABLET | Freq: Every morning | ORAL | 0 refills | Status: DC

## 2016-07-24 MED ORDER — TRAZODONE HCL 100 MG PO TABS: 200.0000 mg | ORAL_TABLET | Freq: Every day | ORAL | 0 refills | Status: DC

## 2016-07-24 MED ORDER — CLONAZEPAM 1 MG PO TABS: 1.0000 mg | ORAL_TABLET | Freq: Every day | ORAL | 2 refills | Status: DC

## 2016-07-24 MED ORDER — ZIPRASIDONE HCL 80 MG PO CAPS: 80.0000 mg | ORAL_CAPSULE | Freq: Every day | ORAL | 0 refills | Status: DC

## 2016-07-24 NOTE — Progress Notes (Signed)
BH MD/PA/NP OP Progress Note  07/24/2016 3:32 PM Chelsea [name]  MRN:  [id number]  Chief Complaint:  Subjective:  I lost weight.  I'm doing good.  HPI: Chelsea [name] came for her follow-up appointment.  She is compliant with medication and reported no side effects.  She lost another 10 pounds in past 3 months.  She is more active, sociable and have more energy level.  She denies any irritability, mania, anger, hallucination or any paranoid thinking.  She sleeping good.  She has no tremors or shakes.  She enjoys her niece baby who is now 61 months old.  Patient's sister does babysitting and sometime patient also helps her sister.  Recently she was seen in the emergency room and then admitted to the hospital because of dysphagia after eating food.  Patient mentioned something stuck in her throat and it was very painful.  She was given treatment and now she is feeling better.  Patient like to continue her current psychiatric medication.  She also saw recently neurologist for restless leg at taking weekly which is working very well.  Patient is taking Geodon, Klonopin, Paxil and trazodone.  She has no concern.  Patient denies drinking alcohol or using any illegal substances.  Visit Diagnosis:    ICD-9-CM ICD-10-CM   1. Paranoid schizophrenia ([place]) 295.30 F20.0 ziprasidone (GEODON) 80 MG capsule     traZODone (DESYREL) 100 MG tablet     PARoxetine (PAXIL) 40 MG tablet     clonazePAM (KLONOPIN) 1 MG tablet    Past Psychiatric History: Reviewed. Patient has long history of psychiatric illness. She [facility]. There is no history of suicidal attempt however she has history of mania and psychosis. She admitted talking to the picture and herself in the past. She do not remember what medicine she had tried in the past.   Past Medical History:  Past Medical History:  Diagnosis Date  . Anxiety   . Arthritis   . Bipolar disorder ([place])   . COPD (chronic obstructive  pulmonary disease) ([place])   . Diabetes mellitus type II   . Esophageal stricture    moderate distal  . GERD (gastroesophageal reflux disease)   . Heart murmur   . Hyperlipemia   . Hypertension   . Hypothyroidism   . Mental disorder    Bipolar  . Obesity   . Oxygen desaturation during sleep    wears 2 liters at bedtime   . Schizophrenia ([place])   . Shortness of breath   . Sleep apnea    wears 2 liters of oxygen at bedtime instead of CPAP    Past Surgical History:  Procedure Laterality Date  . BALLOON DILATION N/A 06/10/2016   Procedure: BALLOON DILATION;  Surgeon: Wilford Corner, MD;  Location: [facility] ENDOSCOPY;  Service: Endoscopy;  Laterality: N/A;  . BREAST EXCISIONAL BIOPSY Left 05/10/2012   benign  . CHOLECYSTECTOMY    . ESOPHAGOGASTRODUODENOSCOPY N/A 06/03/2016   Procedure: ESOPHAGOGASTRODUODENOSCOPY (EGD);  Surgeon: Wilford Corner, MD;  Location: [facility] ENDOSCOPY;  Service: Endoscopy;  Laterality: N/A;  . ESOPHAGOGASTRODUODENOSCOPY N/A 06/10/2016   Procedure: ESOPHAGOGASTRODUODENOSCOPY (EGD);  Surgeon: Wilford Corner, MD;  Location: [facility] ENDOSCOPY;  Service: Endoscopy;  Laterality: N/A;  . EYE SURGERY    . PARTIAL MASTECTOMY WITH NEEDLE LOCALIZATION Left 05/10/2012   Procedure: PARTIAL MASTECTOMY WITH NEEDLE LOCALIZATION;  Surgeon: Adin Hector, MD;  Location: [place];  Service: General;  Laterality: Left;  . TONSILLECTOMY      Family  Psychiatric History: Reviewed.  Family History:  Family History  Problem Relation Age of Onset  . Alcohol abuse Father   . Stroke Father   . Alzheimer's disease Mother     Social History:  Social History   Social History  . Marital status: Divorced    Spouse name: N/A  . Number of children: 0  . Years of education: HS   Occupational History  . Retired     Social History Main Topics  . Smoking status: Former Smoker    Packs/day: 1.50    Years: 20.00    Types: Cigarettes    Quit date: 05/03/1993  . Smokeless tobacco: Never Used   . Alcohol use No  . Drug use: No  . Sexual activity: Not Currently   Other Topics Concern  . Not on file   Social History Narrative   Drinks 2 cups of coffee a day    Allergies:  Allergies  Allergen Reactions  . Darvocet [Propoxyphene N-Acetaminophen] Anaphylaxis    Can take plain tylenol  . Fluoxetine Other (See Comments)    Hallucinations   . Penicillins     REACTION: rash  . Promethazine Hives    Metabolic Disorder Labs: Lab Results  Component Value Date   HGBA1C 6.8 (H) 12/03/2012   MPG 148 (H) 12/03/2012   No results found for: PROLACTIN No results found for: CHOL, TRIG, HDL, CHOLHDL, VLDL, LDLCALC   Current Medications: Current Outpatient Prescriptions  Medication Sig Dispense Refill  . acetaminophen (TYLENOL) 325 MG tablet Take 2 tablets (650 mg total) by mouth every 6 (six) hours as needed for pain.    Marland Kitchen aspirin 81 MG chewable tablet Chew 81 mg by mouth daily.    Marland Kitchen atorvastatin (LIPITOR) 20 MG tablet Take 20 mg by mouth daily.     . bumetanide (BUMEX) 1 MG tablet Take 1 tablet (1 mg total) by mouth daily. 30 tablet 0  . clonazePAM (KLONOPIN) 1 MG tablet Take 1 tablet (1 mg total) by mouth at bedtime. 30 tablet 2  . fluticasone furoate-vilanterol (BREO ELLIPTA) 200-25 MCG/INH AEPB Inhale 1 puff into the lungs every morning.    . Insulin Detemir (LEVEMIR FLEXPEN) 100 UNIT/ML SOPN Inject 95 Units into the skin at bedtime.     . INVOKANA 300 MG TABS tablet Take 300 mg by mouth daily before breakfast.     . levothyroxine (SYNTHROID) 150 MCG tablet Take 150 mcg by mouth daily.    . metFORMIN (GLUCOPHAGE) 1000 MG tablet Take 500 mg by mouth 2 (two) times daily with a meal.     . PARoxetine (PAXIL) 40 MG tablet Take 1 tablet (40 mg total) by mouth every morning. 90 tablet 0  . rOPINIRole (REQUIP) 0.25 MG tablet Take 3 tablets (0.75 mg total) by mouth at bedtime. 90 tablet 5  . traZODone (DESYREL) 100 MG tablet Take 2 tablets (200 mg total) by mouth at bedtime. 180  tablet 0  . ziprasidone (GEODON) 80 MG capsule Take 1 capsule (80 mg total) by mouth at bedtime. 90 capsule 0   No current facility-administered medications for this visit.     Neurologic: Headache: No Seizure: No Paresthesias: No  Musculoskeletal: Strength & Muscle Tone: within normal limits Gait & Station: normal Patient leans: N/A  Psychiatric Specialty Exam: ROS  Blood pressure 131/76, pulse 99, height 5\' 2"  (1.575 m), weight 245 lb (111.1 kg).Body mass index is 44.81 kg/m.  General Appearance: Casual  Eye Contact:  Good  Speech:  Slow  Volume:  Normal  Mood:  Euthymic  Affect:  Congruent  Thought Process:  Goal Directed  Orientation:  Full (Time, Place, and Person)  Thought Content: WDL and Logical   Suicidal Thoughts:  No  Homicidal Thoughts:  No  Memory:  Immediate;   Good Recent;   Fair Remote;   Good  Judgement:  Good  Insight:  Good  Psychomotor Activity:  Normal  Concentration:  Concentration: Fair and Attention Span: Fair  Recall:  Good  Fund of Knowledge: Good  Language: Good  Akathisia:  No  Handed:  Right  AIMS (if indicated):  0  Assets:  Communication Skills Desire for Improvement Housing Resilience Social Support  ADL's:  Intact  Cognition: WNL  Sleep:  Adequate    Assessment: Schizophrenia chronic paranoid type.  Anxiety disorder NOS.  Plan: Patient is a stable on her current psychiatric medication.  I will continue Paxil 40 mg daily, trazodone 200 mg at bedtime, Klonopin 1 mg at bedtime and Geodon 80 mg at bedtime.  Discussed medication side effects and benefits.  Patient has no tremors, shakes or any EPS.  Recommended to call us back if she has any question, concern or if she feels worsening of the symptom.  Follow-up in 3 months.  Chelsea [name] T., MD 07/24/2016, 3:32 PM

## 2016-08-08 DIAGNOSIS — J309 Allergic rhinitis, unspecified: Secondary | ICD-10-CM | POA: Diagnosis not present

## 2016-08-14 ENCOUNTER — Other Ambulatory Visit: Payer: Self-pay | Admitting: Neurology

## 2016-08-15 DIAGNOSIS — H5203 Hypermetropia, bilateral: Secondary | ICD-10-CM | POA: Diagnosis not present

## 2016-08-15 DIAGNOSIS — H25813 Combined forms of age-related cataract, bilateral: Secondary | ICD-10-CM | POA: Diagnosis not present

## 2016-08-15 DIAGNOSIS — H524 Presbyopia: Secondary | ICD-10-CM | POA: Diagnosis not present

## 2016-08-15 DIAGNOSIS — H5000 Unspecified esotropia: Secondary | ICD-10-CM | POA: Diagnosis not present

## 2016-08-15 DIAGNOSIS — E119 Type 2 diabetes mellitus without complications: Secondary | ICD-10-CM | POA: Diagnosis not present

## 2016-08-15 DIAGNOSIS — H52223 Regular astigmatism, bilateral: Secondary | ICD-10-CM | POA: Diagnosis not present

## 2016-08-15 DIAGNOSIS — H353131 Nonexudative age-related macular degeneration, bilateral, early dry stage: Secondary | ICD-10-CM | POA: Diagnosis not present

## 2016-08-20 DIAGNOSIS — J309 Allergic rhinitis, unspecified: Secondary | ICD-10-CM | POA: Diagnosis not present

## 2016-08-20 DIAGNOSIS — J449 Chronic obstructive pulmonary disease, unspecified: Secondary | ICD-10-CM | POA: Diagnosis not present

## 2016-08-20 DIAGNOSIS — R05 Cough: Secondary | ICD-10-CM | POA: Diagnosis not present

## 2016-08-21 ENCOUNTER — Other Ambulatory Visit (HOSPITAL_COMMUNITY): Payer: Self-pay

## 2016-08-21 DIAGNOSIS — F200 Paranoid schizophrenia: Secondary | ICD-10-CM

## 2016-08-21 MED ORDER — ZIPRASIDONE HCL 80 MG PO CAPS: 80.0000 mg | ORAL_CAPSULE | Freq: Every day | ORAL | 0 refills | Status: DC

## 2016-09-17 DIAGNOSIS — K219 Gastro-esophageal reflux disease without esophagitis: Secondary | ICD-10-CM | POA: Diagnosis not present

## 2016-09-17 DIAGNOSIS — Z1211 Encounter for screening for malignant neoplasm of colon: Secondary | ICD-10-CM | POA: Diagnosis not present

## 2016-09-17 DIAGNOSIS — E119 Type 2 diabetes mellitus without complications: Secondary | ICD-10-CM | POA: Diagnosis not present

## 2016-09-17 DIAGNOSIS — Z6841 Body Mass Index (BMI) 40.0 and over, adult: Secondary | ICD-10-CM | POA: Diagnosis not present

## 2016-09-17 DIAGNOSIS — M170 Bilateral primary osteoarthritis of knee: Secondary | ICD-10-CM | POA: Diagnosis not present

## 2016-09-17 DIAGNOSIS — K222 Esophageal obstruction: Secondary | ICD-10-CM | POA: Diagnosis not present

## 2016-09-17 DIAGNOSIS — M179 Osteoarthritis of knee, unspecified: Secondary | ICD-10-CM | POA: Diagnosis not present

## 2016-10-15 DIAGNOSIS — N390 Urinary tract infection, site not specified: Secondary | ICD-10-CM | POA: Diagnosis not present

## 2016-10-15 DIAGNOSIS — Z6841 Body Mass Index (BMI) 40.0 and over, adult: Secondary | ICD-10-CM | POA: Diagnosis not present

## 2016-10-15 DIAGNOSIS — Z1211 Encounter for screening for malignant neoplasm of colon: Secondary | ICD-10-CM | POA: Diagnosis not present

## 2016-10-15 DIAGNOSIS — Z0000 Encounter for general adult medical examination without abnormal findings: Secondary | ICD-10-CM | POA: Diagnosis not present

## 2016-10-23 ENCOUNTER — Ambulatory Visit (INDEPENDENT_AMBULATORY_CARE_PROVIDER_SITE_OTHER): Payer: Medicare Other | Admitting: Psychiatry

## 2016-10-23 ENCOUNTER — Other Ambulatory Visit (HOSPITAL_COMMUNITY): Payer: Self-pay | Admitting: Psychiatry

## 2016-10-23 ENCOUNTER — Encounter (HOSPITAL_COMMUNITY): Payer: Self-pay | Admitting: Psychiatry

## 2016-10-23 DIAGNOSIS — F200 Paranoid schizophrenia: Secondary | ICD-10-CM

## 2016-10-23 DIAGNOSIS — Z811 Family history of alcohol abuse and dependence: Secondary | ICD-10-CM

## 2016-10-23 DIAGNOSIS — Z810 Family history of intellectual disabilities: Secondary | ICD-10-CM | POA: Diagnosis not present

## 2016-10-23 DIAGNOSIS — Z87891 Personal history of nicotine dependence: Secondary | ICD-10-CM

## 2016-10-23 MED ORDER — CLONAZEPAM 1 MG PO TABS: 1.0000 mg | ORAL_TABLET | Freq: Every day | ORAL | 2 refills | Status: DC

## 2016-10-23 MED ORDER — ZIPRASIDONE HCL 80 MG PO CAPS: 80.0000 mg | ORAL_CAPSULE | Freq: Every day | ORAL | 0 refills | Status: DC

## 2016-10-23 MED ORDER — PAROXETINE HCL 40 MG PO TABS: 40.0000 mg | ORAL_TABLET | Freq: Every morning | ORAL | 0 refills | Status: DC

## 2016-10-23 MED ORDER — TRAZODONE HCL 100 MG PO TABS: 200.0000 mg | ORAL_TABLET | Freq: Every day | ORAL | 0 refills | Status: DC

## 2016-10-23 NOTE — Progress Notes (Signed)
BH MD/PA/NP OP Progress Note  10/23/2016 3:38 PM Chelsea [name]  MRN:  [id number]  Chief Complaint:  Chief Complaint    Follow-up     Subjective:  I'm doing good.  I need my refills.  HPI: Chelsea [name] came for her follow-up appointment.  She is taking the medication as prescribed.  She sleeping good.  She denies any irritability, anger, mania, psychosis or any hallucination.  She lost her mother a few pounds from the last visit.  She had good energy level.  She is social active denies any issues with the medication.  She has no tremors shakes or any EPS.  She enjoys spending time with her niece`s baby who is now 6-month-old.  Patient's sister does babysitting and sometime she also help her sister.  She denies any suicidal thoughts or homicidal thought.  She denies any crying spells or any feeling of hopelessness or worthlessness.  She is taking Requip is prescribed by neurologist which is helping her restless leg.  Patient denies drinking alcohol or using any illegal substances.  She wants to continue Geodon, Klonopin, Paxil and trazodone.  Visit Diagnosis:    ICD-10-CM   1. Paranoid schizophrenia ([place]) F20.0 ziprasidone (GEODON) 80 MG capsule    PARoxetine (PAXIL) 40 MG tablet    clonazePAM (KLONOPIN) 1 MG tablet    traZODone (DESYREL) 100 MG tablet    Past Psychiatric History: Reviewed. Patient has long history of psychiatric illness. She was admitted at [facility] and [facility]. There is no history of suicidal attempt however she has history of mania and psychosis. She admitted talking to the picture and herself in the past. She do not remember what medicine she had tried in the past.   Past Medical History:  Past Medical History:  Diagnosis Date  . Anxiety   . Arthritis   . Bipolar disorder ([place])   . COPD (chronic obstructive pulmonary disease) ([place])   . Diabetes mellitus type II   . Esophageal stricture    moderate distal  . GERD (gastroesophageal reflux disease)   .  Heart murmur   . Hyperlipemia   . Hypertension   . Hypothyroidism   . Mental disorder    Bipolar  . Obesity   . Oxygen desaturation during sleep    wears 2 liters at bedtime   . Schizophrenia ([place])   . Shortness of breath   . Sleep apnea    wears 2 liters of oxygen at bedtime instead of CPAP    Past Surgical History:  Procedure Laterality Date  . BALLOON DILATION N/A 06/10/2016   Procedure: BALLOON DILATION;  Surgeon: Wilford Corner, MD;  Location: [facility] ENDOSCOPY;  Service: Endoscopy;  Laterality: N/A;  . BREAST EXCISIONAL BIOPSY Left 05/10/2012   benign  . CHOLECYSTECTOMY    . ESOPHAGOGASTRODUODENOSCOPY N/A 06/03/2016   Procedure: ESOPHAGOGASTRODUODENOSCOPY (EGD);  Surgeon: Wilford Corner, MD;  Location: [facility] ENDOSCOPY;  Service: Endoscopy;  Laterality: N/A;  . ESOPHAGOGASTRODUODENOSCOPY N/A 06/10/2016   Procedure: ESOPHAGOGASTRODUODENOSCOPY (EGD);  Surgeon: Wilford Corner, MD;  Location: [facility] ENDOSCOPY;  Service: Endoscopy;  Laterality: N/A;  . EYE SURGERY    . PARTIAL MASTECTOMY WITH NEEDLE LOCALIZATION Left 05/10/2012   Procedure: PARTIAL MASTECTOMY WITH NEEDLE LOCALIZATION;  Surgeon: Adin Hector, MD;  Location: [place];  Service: General;  Laterality: Left;  . TONSILLECTOMY      Family Psychiatric History: Reviewed.  Family History:  Family History  Problem Relation Age of Onset  . Alcohol abuse Father   . Stroke Father   .  Alzheimer's disease Mother     Social History:  Social History   Social History  . Marital status: Divorced    Spouse name: N/A  . Number of children: 0  . Years of education: HS   Occupational History  . Retired     Social History Main Topics  . Smoking status: Former Smoker    Packs/day: 1.50    Years: 20.00    Types: Cigarettes    Quit date: 05/03/1993  . Smokeless tobacco: Never Used  . Alcohol use No  . Drug use: No  . Sexual activity: Not Currently   Other Topics Concern  . None   Social History Narrative   Drinks 2 cups  of coffee a day    Allergies:  Allergies  Allergen Reactions  . Darvocet [Propoxyphene N-Acetaminophen] Anaphylaxis    Can take plain tylenol  . Fluoxetine Other (See Comments)    Hallucinations   . Penicillins     REACTION: rash  . Promethazine Hives    Metabolic Disorder Labs: Lab Results  Component Value Date   HGBA1C 6.8 (H) 12/03/2012   MPG 148 (H) 12/03/2012   No results found for: PROLACTIN No results found for: CHOL, TRIG, HDL, CHOLHDL, VLDL, LDLCALC   Current Medications: Current Outpatient Prescriptions  Medication Sig Dispense Refill  . acetaminophen (TYLENOL) 325 MG tablet Take 2 tablets (650 mg total) by mouth every 6 (six) hours as needed for pain.    Marland Kitchen aspirin 81 MG chewable tablet Chew 81 mg by mouth daily.    Marland Kitchen atorvastatin (LIPITOR) 20 MG tablet Take 20 mg by mouth daily.     . bumetanide (BUMEX) 1 MG tablet Take 1 tablet (1 mg total) by mouth daily. 30 tablet 0  . clonazePAM (KLONOPIN) 1 MG tablet Take 1 tablet (1 mg total) by mouth at bedtime. 30 tablet 2  . fluticasone furoate-vilanterol (BREO ELLIPTA) 200-25 MCG/INH AEPB Inhale 1 puff into the lungs every morning.    . Insulin Detemir (LEVEMIR FLEXPEN) 100 UNIT/ML SOPN Inject 95 Units into the skin at bedtime.     . INVOKANA 300 MG TABS tablet Take 300 mg by mouth daily before breakfast.     . levothyroxine (SYNTHROID) 150 MCG tablet Take 150 mcg by mouth daily.    . metFORMIN (GLUCOPHAGE) 1000 MG tablet Take 500 mg by mouth 2 (two) times daily with a meal.     . PARoxetine (PAXIL) 40 MG tablet Take 1 tablet (40 mg total) by mouth every morning. 90 tablet 0  . rOPINIRole (REQUIP) 0.25 MG tablet Take 3 tablets (0.75 mg total) by mouth at bedtime. 90 tablet 5  . traZODone (DESYREL) 100 MG tablet Take 2 tablets (200 mg total) by mouth at bedtime. 180 tablet 0  . ziprasidone (GEODON) 80 MG capsule Take 1 capsule (80 mg total) by mouth at bedtime. 90 capsule 0   No current facility-administered medications  for this visit.     Neurologic: Headache: No Seizure: No Paresthesias: No  Musculoskeletal: Strength & Muscle Tone: within normal limits Gait & Station: normal Patient leans: N/A  Psychiatric Specialty Exam: ROS  Blood pressure 128/76, pulse 95, height 5' 2.75" (1.594 m), weight 238 lb (108 kg).Body mass index is 42.5 kg/m.  General Appearance: Casual  Eye Contact:  Good  Speech:  Clear and Coherent  Volume:  Normal  Mood:  Euthymic  Affect:  Congruent  Thought Process:  Goal Directed  Orientation:  Full (Time, Place, and Person)  Thought  Content: Logical   Suicidal Thoughts:  No  Homicidal Thoughts:  No  Memory:  Immediate;   Good Recent;   Good Remote;   Good  Judgement:  Good  Insight:  Good  Psychomotor Activity:  Normal  Concentration:  Concentration: Good and Attention Span: Good  Recall:  Good  Fund of Knowledge: Good  Language: Good  Akathisia:  No  Handed:  Right  AIMS (if indicated):  0  Assets:  Communication Skills Desire for Improvement Resilience Social Support  ADL's:  Intact  Cognition: WNL  Sleep:  ok    Assessment: Schizophrenia chronic paranoid type.  Plan: Patient is a stable on her current psychiatric medication.  She does not want to change her meds.  I will continue Paxil 40 mg daily, trazodone 200 mg at bedtime, Klonopin 1 mg at bedtime and Geodon 80 mg at bedtime.  Discussed medication side effects and benefits.  Recommended to call us back if she has any question, concern or if she feels worsening of the symptom.  Follow-up in 3 months.  Savir Blanke T., MD 10/23/2016, 3:38 PM

## 2016-11-19 DIAGNOSIS — N183 Chronic kidney disease, stage 3 (moderate): Secondary | ICD-10-CM | POA: Diagnosis not present

## 2016-11-19 DIAGNOSIS — N390 Urinary tract infection, site not specified: Secondary | ICD-10-CM | POA: Diagnosis not present

## 2016-11-19 DIAGNOSIS — I129 Hypertensive chronic kidney disease with stage 1 through stage 4 chronic kidney disease, or unspecified chronic kidney disease: Secondary | ICD-10-CM | POA: Diagnosis not present

## 2016-11-26 ENCOUNTER — Encounter: Payer: Self-pay | Admitting: Family Medicine

## 2016-12-05 DIAGNOSIS — N390 Urinary tract infection, site not specified: Secondary | ICD-10-CM | POA: Diagnosis not present

## 2016-12-16 ENCOUNTER — Other Ambulatory Visit: Payer: Self-pay | Admitting: Family Medicine

## 2016-12-16 ENCOUNTER — Ambulatory Visit
Admission: RE | Admit: 2016-12-16 | Discharge: 2016-12-16 | Disposition: A | Discharge status: Home / Self Care | Payer: Medicare Other | Source: Ambulatory Visit | Attending: Family Medicine | Admitting: Family Medicine

## 2016-12-16 DIAGNOSIS — M179 Osteoarthritis of knee, unspecified: Secondary | ICD-10-CM

## 2016-12-16 DIAGNOSIS — M1710 Unilateral primary osteoarthritis, unspecified knee: Secondary | ICD-10-CM

## 2016-12-17 DIAGNOSIS — E119 Type 2 diabetes mellitus without complications: Secondary | ICD-10-CM | POA: Diagnosis not present

## 2016-12-17 DIAGNOSIS — Z23 Encounter for immunization: Secondary | ICD-10-CM | POA: Diagnosis not present

## 2016-12-17 DIAGNOSIS — M170 Bilateral primary osteoarthritis of knee: Secondary | ICD-10-CM | POA: Diagnosis not present

## 2016-12-17 DIAGNOSIS — J449 Chronic obstructive pulmonary disease, unspecified: Secondary | ICD-10-CM | POA: Diagnosis not present

## 2016-12-23 DIAGNOSIS — N390 Urinary tract infection, site not specified: Secondary | ICD-10-CM | POA: Diagnosis not present

## 2017-01-06 ENCOUNTER — Other Ambulatory Visit: Payer: Self-pay | Admitting: Gastroenterology

## 2017-01-16 ENCOUNTER — Other Ambulatory Visit (HOSPITAL_COMMUNITY): Payer: Self-pay | Admitting: Psychiatry

## 2017-01-16 DIAGNOSIS — F200 Paranoid schizophrenia: Secondary | ICD-10-CM

## 2017-01-20 ENCOUNTER — Encounter (HOSPITAL_COMMUNITY): Payer: Self-pay | Admitting: Psychiatry

## 2017-01-20 ENCOUNTER — Ambulatory Visit (INDEPENDENT_AMBULATORY_CARE_PROVIDER_SITE_OTHER): Payer: Medicare Other | Admitting: Psychiatry

## 2017-01-20 ENCOUNTER — Other Ambulatory Visit (HOSPITAL_COMMUNITY): Payer: Self-pay

## 2017-01-20 DIAGNOSIS — F200 Paranoid schizophrenia: Secondary | ICD-10-CM

## 2017-01-20 DIAGNOSIS — Z811 Family history of alcohol abuse and dependence: Secondary | ICD-10-CM | POA: Diagnosis not present

## 2017-01-20 DIAGNOSIS — E039 Hypothyroidism, unspecified: Secondary | ICD-10-CM | POA: Diagnosis not present

## 2017-01-20 DIAGNOSIS — Z810 Family history of intellectual disabilities: Secondary | ICD-10-CM | POA: Diagnosis not present

## 2017-01-20 DIAGNOSIS — Z794 Long term (current) use of insulin: Secondary | ICD-10-CM | POA: Diagnosis not present

## 2017-01-20 DIAGNOSIS — Z87891 Personal history of nicotine dependence: Secondary | ICD-10-CM | POA: Diagnosis not present

## 2017-01-20 DIAGNOSIS — I10 Essential (primary) hypertension: Secondary | ICD-10-CM | POA: Diagnosis not present

## 2017-01-20 DIAGNOSIS — E119 Type 2 diabetes mellitus without complications: Secondary | ICD-10-CM | POA: Diagnosis not present

## 2017-01-20 DIAGNOSIS — E559 Vitamin D deficiency, unspecified: Secondary | ICD-10-CM | POA: Diagnosis not present

## 2017-01-20 DIAGNOSIS — E782 Mixed hyperlipidemia: Secondary | ICD-10-CM | POA: Diagnosis not present

## 2017-01-20 DIAGNOSIS — Z79899 Other long term (current) drug therapy: Secondary | ICD-10-CM | POA: Diagnosis not present

## 2017-01-20 MED ORDER — CLONAZEPAM 1 MG PO TABS: 1.0000 mg | ORAL_TABLET | Freq: Every day | ORAL | 2 refills | Status: DC

## 2017-01-20 MED ORDER — PAROXETINE HCL 40 MG PO TABS: 40.0000 mg | ORAL_TABLET | Freq: Every morning | ORAL | 0 refills | Status: DC

## 2017-01-20 MED ORDER — TRAZODONE HCL 100 MG PO TABS: 200.0000 mg | ORAL_TABLET | Freq: Every day | ORAL | 0 refills | Status: DC

## 2017-01-20 MED ORDER — ZIPRASIDONE HCL 80 MG PO CAPS: 80.0000 mg | ORAL_CAPSULE | Freq: Every day | ORAL | 0 refills | Status: DC

## 2017-01-20 NOTE — Progress Notes (Signed)
BH MD/PA/NP OP Progress Note  01/20/2017 11:27 AM Chelsea [name]  MRN:  [id number]  Chief Complaint: I am doing fine.  I need my refills.  HPI: Chelsea [name] came for her follow-up appointment.  She is compliant with medication and denies any side effects.  Recently her insulin dose was increased by her primary care physician because high blood sugar.  She scheduled to have blood work for hemoglobin A1c on coming Friday.  Overall she described her mood is good.  She sleeping good she denies any irritability, anger, mania or any psychosis.  She denies any paranoid thinking.  She denies any agitation or any anger.  Her energy level is fair.  She has no tremors, shakes or any EPS.  She does not want to change her medication.  Patient denies drinking alcohol or using any illegal substances.  She lives with her sister. She wants to continue Klonopin, Paxil, trazodone and Geodon.  Visit Diagnosis:    ICD-10-CM   1. Paranoid schizophrenia ([place]) F20.0 traZODone (DESYREL) 100 MG tablet    ziprasidone (GEODON) 80 MG capsule    PARoxetine (PAXIL) 40 MG tablet    clonazePAM (KLONOPIN) 1 MG tablet    Past Psychiatric History: Reviewed. Patient has long history of psychiatric illness. She was admitted at [facility] and [facility]. There is no history of suicidal attempt however she has history of mania and psychosis. She admitted talking to the picture and herself in the past. She do not remember what medicine she had tried in the past.   Past Medical History:  Past Medical History:  Diagnosis Date  . Anxiety   . Arthritis   . Bipolar disorder ([place])   . COPD (chronic obstructive pulmonary disease) ([place])   . Diabetes mellitus type II   . Esophageal stricture    moderate distal  . GERD (gastroesophageal reflux disease)   . Heart murmur   . Hyperlipemia   . Hypertension   . Hypothyroidism   . Mental disorder    Bipolar  . Obesity   . Oxygen desaturation during sleep    wears 2 liters at  bedtime   . Schizophrenia ([place])   . Shortness of breath   . Sleep apnea    wears 2 liters of oxygen at bedtime instead of CPAP    Past Surgical History:  Procedure Laterality Date  . BALLOON DILATION N/A 06/10/2016   Procedure: BALLOON DILATION;  Surgeon: Wilford Corner, MD;  Location: [facility] ENDOSCOPY;  Service: Endoscopy;  Laterality: N/A;  . BREAST EXCISIONAL BIOPSY Left 05/10/2012   benign  . CHOLECYSTECTOMY    . ESOPHAGOGASTRODUODENOSCOPY N/A 06/03/2016   Procedure: ESOPHAGOGASTRODUODENOSCOPY (EGD);  Surgeon: Wilford Corner, MD;  Location: [facility] ENDOSCOPY;  Service: Endoscopy;  Laterality: N/A;  . ESOPHAGOGASTRODUODENOSCOPY N/A 06/10/2016   Procedure: ESOPHAGOGASTRODUODENOSCOPY (EGD);  Surgeon: Wilford Corner, MD;  Location: [facility] ENDOSCOPY;  Service: Endoscopy;  Laterality: N/A;  . EYE SURGERY    . PARTIAL MASTECTOMY WITH NEEDLE LOCALIZATION Left 05/10/2012   Procedure: PARTIAL MASTECTOMY WITH NEEDLE LOCALIZATION;  Surgeon: Adin Hector, MD;  Location: [place];  Service: General;  Laterality: Left;  . TONSILLECTOMY      Family Psychiatric History: reviewed.  Family History:  Family History  Problem Relation Age of Onset  . Alcohol abuse Father   . Stroke Father   . Alzheimer's disease Mother     Social History:  Social History   Social History  . Marital status: Divorced    Spouse name: N/A  .  Number of children: 0  . Years of education: HS   Occupational History  . Retired     Social History Main Topics  . Smoking status: Former Smoker    Packs/day: 1.50    Years: 20.00    Types: Cigarettes    Quit date: 05/03/1993  . Smokeless tobacco: Never Used  . Alcohol use No  . Drug use: No  . Sexual activity: Not Currently   Other Topics Concern  . None   Social History Narrative   Drinks 2 cups of coffee a day    Allergies:  Allergies  Allergen Reactions  . Darvocet [Propoxyphene N-Acetaminophen] Anaphylaxis    Can take plain tylenol  . Fluoxetine Other (See  Comments)    Hallucinations   . Penicillins     REACTION: rash  . Promethazine Hives    Metabolic Disorder Labs: Lab Results  Component Value Date   HGBA1C 6.8 (H) 12/03/2012   MPG 148 (H) 12/03/2012   No results found for: PROLACTIN No results found for: CHOL, TRIG, HDL, CHOLHDL, VLDL, LDLCALC Lab Results  Component Value Date   TSH 1.509 12/03/2012    Therapeutic Level Labs: No results found for: LITHIUM No results found for: VALPROATE No components found for:  CBMZ  Current Medications: Current Outpatient Prescriptions  Medication Sig Dispense Refill  . acetaminophen (TYLENOL) 325 MG tablet Take 2 tablets (650 mg total) by mouth every 6 (six) hours as needed for pain.    Marland Kitchen aspirin 81 MG chewable tablet Chew 81 mg by mouth daily.    Marland Kitchen atorvastatin (LIPITOR) 20 MG tablet Take 20 mg by mouth daily.     . bumetanide (BUMEX) 1 MG tablet Take 1 tablet (1 mg total) by mouth daily. 30 tablet 0  . clonazePAM (KLONOPIN) 1 MG tablet Take 1 tablet (1 mg total) by mouth at bedtime. 30 tablet 2  . fluticasone furoate-vilanterol (BREO ELLIPTA) 200-25 MCG/INH AEPB Inhale 1 puff into the lungs every morning.    . Insulin Detemir (LEVEMIR FLEXPEN) 100 UNIT/ML SOPN Inject 95 Units into the skin at bedtime.     . INVOKANA 300 MG TABS tablet Take 300 mg by mouth daily before breakfast.     . levothyroxine (SYNTHROID) 150 MCG tablet Take 150 mcg by mouth daily.    . metFORMIN (GLUCOPHAGE) 1000 MG tablet Take 500 mg by mouth 2 (two) times daily with a meal.     . PARoxetine (PAXIL) 40 MG tablet Take 1 tablet (40 mg total) by mouth every morning. 90 tablet 0  . rOPINIRole (REQUIP) 0.25 MG tablet Take 3 tablets (0.75 mg total) by mouth at bedtime. 90 tablet 5  . traZODone (DESYREL) 100 MG tablet Take 2 tablets (200 mg total) by mouth at bedtime. 180 tablet 0  . ziprasidone (GEODON) 80 MG capsule Take 1 capsule (80 mg total) by mouth at bedtime. 90 capsule 0   No current facility-administered  medications for this visit.      Musculoskeletal: Strength & Muscle Tone: within normal limits Gait & Station: normal Patient leans: N/A  Psychiatric Specialty Exam: ROS  Blood pressure 128/75, pulse (!) 109, height 5\' 2"  (1.575 m), weight 234 lb 12.8 oz (106.5 kg).Body mass index is 42.95 kg/m.  General Appearance: Casual  Eye Contact:  Good  Speech:  Clear and Coherent and Slow  Volume:  Normal  Mood:  Euthymic  Affect:  Appropriate  Thought Process:  Coherent  Orientation:  Full (Time, Place, and Person)  Thought  Content: Logical   Suicidal Thoughts:  No  Homicidal Thoughts:  No  Memory:  Immediate;   Fair Recent;   Fair Remote;   Fair  Judgement:  Good  Insight:  Good  Psychomotor Activity:  Normal  Concentration:  Concentration: Fair and Attention Span: Fair  Recall:  Good  Fund of Knowledge: Good  Language: Good  Akathisia:  No  Handed:  Right  AIMS (if indicated): not done  Assets:  Communication Skills Desire for Improvement Housing Resilience Social Support  ADL's:  Intact  Cognition: WNL  Sleep:  Good   Screenings:   Assessment and Plan: schizophrenia chronic paranoid type.  Patient is a stable on her current psychiatric medication. Encouraged exercise and watch her calorie intake.  Patient is having a blood work this Friday for hemoglobin A1c and I recommended to have her blood work results faxed to [place].  Continue trazodone 200 mg at bedtime, Klonopin 1 mg at bedtime, Geodon 80 mg at bedtime and Paxil 40 mg daily.  Discussed meditation side effects and benefits.  Patient is not interested in counseling.  Recommended to call us back if she has any question or any concern.  Follow-up in 3 months.   Keiondre Colee T., MD 01/20/2017, 11:27 AM

## 2017-01-21 ENCOUNTER — Other Ambulatory Visit: Payer: Self-pay | Admitting: Gastroenterology

## 2017-01-21 ENCOUNTER — Other Ambulatory Visit: Payer: Self-pay | Admitting: Neurology

## 2017-01-22 ENCOUNTER — Ambulatory Visit (HOSPITAL_COMMUNITY): Payer: Self-pay | Admitting: Psychiatry

## 2017-01-23 ENCOUNTER — Other Ambulatory Visit (HOSPITAL_COMMUNITY): Payer: Self-pay | Admitting: Psychiatry

## 2017-01-23 DIAGNOSIS — F200 Paranoid schizophrenia: Secondary | ICD-10-CM

## 2017-01-23 DIAGNOSIS — E039 Hypothyroidism, unspecified: Secondary | ICD-10-CM | POA: Diagnosis not present

## 2017-01-23 DIAGNOSIS — E119 Type 2 diabetes mellitus without complications: Secondary | ICD-10-CM | POA: Diagnosis not present

## 2017-01-23 DIAGNOSIS — E782 Mixed hyperlipidemia: Secondary | ICD-10-CM | POA: Diagnosis not present

## 2017-01-23 DIAGNOSIS — E559 Vitamin D deficiency, unspecified: Secondary | ICD-10-CM | POA: Diagnosis not present

## 2017-01-26 ENCOUNTER — Other Ambulatory Visit (HOSPITAL_COMMUNITY): Payer: Self-pay

## 2017-01-26 DIAGNOSIS — F200 Paranoid schizophrenia: Secondary | ICD-10-CM

## 2017-01-26 MED ORDER — PAROXETINE HCL 40 MG PO TABS: 40.0000 mg | ORAL_TABLET | Freq: Every morning | ORAL | 0 refills | Status: DC

## 2017-01-26 MED ORDER — ZIPRASIDONE HCL 80 MG PO CAPS: 80.0000 mg | ORAL_CAPSULE | Freq: Every day | ORAL | 0 refills | Status: DC

## 2017-01-26 MED ORDER — TRAZODONE HCL 100 MG PO TABS: 200.0000 mg | ORAL_TABLET | Freq: Every day | ORAL | 0 refills | Status: DC

## 2017-01-29 ENCOUNTER — Encounter (HOSPITAL_COMMUNITY): Payer: Self-pay | Admitting: *Deleted

## 2017-01-30 ENCOUNTER — Encounter (HOSPITAL_COMMUNITY): Payer: Self-pay | Admitting: *Deleted

## 2017-01-30 ENCOUNTER — Ambulatory Visit (HOSPITAL_COMMUNITY): Payer: Medicare Other | Admitting: Anesthesiology

## 2017-01-30 ENCOUNTER — Encounter (HOSPITAL_COMMUNITY)
Admission: RE | Disposition: A | Discharge status: Home / Self Care | Payer: Self-pay | Source: Ambulatory Visit | Attending: Gastroenterology

## 2017-01-30 ENCOUNTER — Ambulatory Visit (HOSPITAL_COMMUNITY)
Admission: RE | Admit: 2017-01-30 | Discharge: 2017-01-30 | Disposition: A | Discharge status: Home / Self Care | Payer: Medicare Other | Source: Ambulatory Visit | Attending: Gastroenterology | Admitting: Gastroenterology

## 2017-01-30 DIAGNOSIS — Z87891 Personal history of nicotine dependence: Secondary | ICD-10-CM | POA: Diagnosis not present

## 2017-01-30 DIAGNOSIS — I10 Essential (primary) hypertension: Secondary | ICD-10-CM | POA: Diagnosis not present

## 2017-01-30 DIAGNOSIS — K635 Polyp of colon: Secondary | ICD-10-CM | POA: Diagnosis not present

## 2017-01-30 DIAGNOSIS — Z6841 Body Mass Index (BMI) 40.0 and over, adult: Secondary | ICD-10-CM | POA: Insufficient documentation

## 2017-01-30 DIAGNOSIS — J449 Chronic obstructive pulmonary disease, unspecified: Secondary | ICD-10-CM | POA: Insufficient documentation

## 2017-01-30 DIAGNOSIS — K640 First degree hemorrhoids: Secondary | ICD-10-CM | POA: Diagnosis not present

## 2017-01-30 DIAGNOSIS — Z79899 Other long term (current) drug therapy: Secondary | ICD-10-CM | POA: Insufficient documentation

## 2017-01-30 DIAGNOSIS — Z1211 Encounter for screening for malignant neoplasm of colon: Secondary | ICD-10-CM | POA: Diagnosis not present

## 2017-01-30 DIAGNOSIS — K219 Gastro-esophageal reflux disease without esophagitis: Secondary | ICD-10-CM | POA: Insufficient documentation

## 2017-01-30 DIAGNOSIS — Z9981 Dependence on supplemental oxygen: Secondary | ICD-10-CM | POA: Insufficient documentation

## 2017-01-30 DIAGNOSIS — Z7984 Long term (current) use of oral hypoglycemic drugs: Secondary | ICD-10-CM | POA: Insufficient documentation

## 2017-01-30 DIAGNOSIS — G4730 Sleep apnea, unspecified: Secondary | ICD-10-CM | POA: Insufficient documentation

## 2017-01-30 DIAGNOSIS — E119 Type 2 diabetes mellitus without complications: Secondary | ICD-10-CM | POA: Insufficient documentation

## 2017-01-30 DIAGNOSIS — D120 Benign neoplasm of cecum: Secondary | ICD-10-CM | POA: Diagnosis not present

## 2017-01-30 DIAGNOSIS — K5140 Inflammatory polyps of colon without complications: Secondary | ICD-10-CM | POA: Insufficient documentation

## 2017-01-30 HISTORY — PX: COLONOSCOPY WITH PROPOFOL: SHX5780

## 2017-01-30 LAB — GLUCOSE, CAPILLARY: GLUCOSE-CAPILLARY: 152 mg/dL — AB (ref 65–99)

## 2017-01-30 SURGERY — COLONOSCOPY WITH PROPOFOL
Anesthesia: Monitor Anesthesia Care

## 2017-01-30 MED ORDER — PROPOFOL 10 MG/ML IV BOLUS
INTRAVENOUS | Status: DC | PRN
  Administered 2017-01-30: 50 mg via INTRAVENOUS

## 2017-01-30 MED ORDER — PROPOFOL 500 MG/50ML IV EMUL
INTRAVENOUS | Status: DC | PRN
  Administered 2017-01-30: 125 ug/kg/min via INTRAVENOUS

## 2017-01-30 MED ORDER — LACTATED RINGERS IV SOLN
INTRAVENOUS | Status: DC | PRN
  Administered 2017-01-30: 09:00:00 via INTRAVENOUS

## 2017-01-30 MED ORDER — SODIUM CHLORIDE 0.9 % IV SOLN: INTRAVENOUS | Status: DC

## 2017-01-30 MED ORDER — LIDOCAINE HCL (CARDIAC) 20 MG/ML IV SOLN
INTRAVENOUS | Status: DC | PRN
  Administered 2017-01-30: 100 mg via INTRATRACHEAL

## 2017-01-30 MED ORDER — ONDANSETRON HCL 4 MG/2ML IJ SOLN
INTRAMUSCULAR | Status: DC | PRN
  Administered 2017-01-30: 4 mg via INTRAVENOUS

## 2017-01-30 SURGICAL SUPPLY — 21 items

## 2017-01-30 NOTE — Anesthesia Procedure Notes (Signed)
Procedure Name: MAC Date/Time: 01/30/2017 7:42 AM Performed by: Lance Coon Pre-anesthesia Checklist: Patient identified, Emergency Drugs available, Suction available, Patient being monitored and Timeout performed Patient Re-evaluated:Patient Re-evaluated prior to induction Oxygen Delivery Method: Nasal cannula

## 2017-01-30 NOTE — Transfer of Care (Signed)
Immediate Anesthesia Transfer of Care Note  Patient: Chelsea [name]  Procedure(s) Performed: COLONOSCOPY WITH PROPOFOL (N/A )  Patient Location: Endoscopy Unit  Anesthesia Type:MAC  Level of Consciousness: awake and patient cooperative  Airway & Oxygen Therapy: Patient Spontanous Breathing  Post-op Assessment: Report given to RN and Post -op Vital signs reviewed and stable  Post vital signs: Reviewed and stable  Last Vitals:  Vitals:   01/30/17 0716  BP: (!) 153/52  Pulse: 67  Resp: 13  Temp: 36.5 C  SpO2: 94%    Last Pain:  Vitals:   01/30/17 0716  TempSrc: Oral         Complications: No apparent anesthesia complications

## 2017-01-30 NOTE — Interval H&P Note (Signed)
History and Physical Interval Note:  01/30/2017 7:54 AM  [name]  has presented today for surgery, with the diagnosis of Screening  The various methods of treatment have been discussed with the patient and family. After consideration of risks, benefits and other options for treatment, the patient has consented to  Procedure(s): COLONOSCOPY WITH PROPOFOL (N/A) as a surgical intervention .  The patient's history has been reviewed, patient examined, no change in status, stable for surgery.  I have reviewed the patient's chart and labs.  Questions were answered to the patient's satisfaction.     Colbert C.

## 2017-01-30 NOTE — Anesthesia Preprocedure Evaluation (Signed)
Anesthesia Evaluation  Patient identified by MRN, date of birth, ID band Patient awake    Reviewed: Allergy & Precautions, NPO status , Patient's Chart, lab work & pertinent test results  Airway Mallampati: II  TM Distance: >3 FB Neck ROM: Full    Dental  (+) Dental Advisory Given, Lower Dentures, Upper Dentures   Pulmonary sleep apnea , COPD,  COPD inhaler and oxygen dependent, former smoker,    Pulmonary exam normal breath sounds clear to auscultation       Cardiovascular hypertension, Pt. on medications Normal cardiovascular exam Rhythm:Regular Rate:Normal     Neuro/Psych PSYCHIATRIC DISORDERS Anxiety Bipolar Disorder Schizophrenia negative neurological ROS     GI/Hepatic Neg liver ROS, GERD  ,  Endo/Other  diabetes, Type 2, Oral Hypoglycemic AgentsHypothyroidism Morbid obesity  Renal/GU negative Renal ROS     Musculoskeletal  (+) Arthritis ,   Abdominal   Peds  Hematology negative hematology ROS (+)   Anesthesia Other Findings Day of surgery medications reviewed with the patient.  Reproductive/Obstetrics                             Anesthesia Physical Anesthesia Plan  ASA: III  Anesthesia Plan: MAC   Post-op Pain Management:    Induction: Intravenous  PONV Risk Score and Plan: 2 and Ondansetron, Propofol infusion and Treatment may vary due to age or medical condition  Airway Management Planned: Nasal Cannula  Additional Equipment:   Intra-op Plan:   Post-operative Plan:   Informed Consent: I have reviewed the patients History and Physical, chart, labs and discussed the procedure including the risks, benefits and alternatives for the proposed anesthesia with the patient or authorized representative who has indicated his/her understanding and acceptance.   Dental advisory given  Plan Discussed with: CRNA and Anesthesiologist  Anesthesia Plan Comments: (Discussed  risks/benefits/alternatives to MAC sedation including need for ventilatory support, hypotension, need for conversion to general anesthesia.  All patient questions answered.  Patient/guardian wishes to proceed.)        Anesthesia Quick Evaluation

## 2017-01-30 NOTE — Anesthesia Postprocedure Evaluation (Signed)
Anesthesia Post Note  Patient: Chelsea [name]  Procedure(s) Performed: COLONOSCOPY WITH PROPOFOL (N/A )     Patient location during evaluation: PACU Anesthesia Type: MAC Level of consciousness: awake and alert Pain management: pain level controlled Vital Signs Assessment: post-procedure vital signs reviewed and stable Respiratory status: spontaneous breathing Cardiovascular status: stable Anesthetic complications: no    Last Vitals:  Vitals:   01/30/17 0910 01/30/17 0925  BP: 107/67 112/63  Pulse:    Resp: 17 18  Temp: 36.6 C   SpO2: 94% 97%    Last Pain:  Vitals:   01/30/17 0910  TempSrc: Oral                 Nolon Nations

## 2017-01-30 NOTE — H&P (Signed)
Date of Initial H&P: 01/21/17  History reviewed, patient examined, no change in status, stable for surgery.

## 2017-01-30 NOTE — Op Note (Signed)
[facility] Patient Name: Chelsea [name] Procedure Date : 01/30/2017 MRN: [id number] Attending MD: Lear Ng , MD Date of Birth: [birth year] CSN: [id number] Age: 66 Admit Type: Inpatient Procedure:                Colonoscopy Indications:              Screening for colorectal malignant neoplasm, This                            is the patient's first colonoscopy Providers:                Lear Ng, MD, Cleda Daub, RN, Alan Mulder, Technician Referring MD:              Medicines:                Propofol per Anesthesia, Monitored Anesthesia Care Complications:            No immediate complications. Estimated Blood Loss:     Estimated blood loss: none. Procedure:                Pre-Anesthesia Assessment:                           - Prior to the procedure, a History and Physical                            was performed, and patient medications and                            allergies were reviewed. The patient's tolerance of                            previous anesthesia was also reviewed. The risks                            and benefits of the procedure and the sedation                            options and risks were discussed with the patient.                            All questions were answered, and informed consent                            was obtained. Prior Anticoagulants: The patient has                            taken no previous anticoagulant or antiplatelet                            agents. ASA Grade Assessment: III - A patient with  severe systemic disease. After reviewing the risks                            and benefits, the patient was deemed in                            satisfactory condition to undergo the procedure.                           After obtaining informed consent, the colonoscope                            was passed under direct vision. Throughout the                procedure, the patient's blood pressure, pulse, and                            oxygen saturations were monitored continuously. The                            EC-[id number] ([id number]) scope was introduced through                            the anus and advanced to the the cecum, identified                            by appendiceal orifice and ileocecal valve. The                            colonoscopy was performed with difficulty due to                            significant looping and a tortuous colon.                            Successful completion of the procedure was aided by                            straightening and shortening the scope to obtain                            bowel loop reduction and applying abdominal                            pressure. The patient tolerated the procedure well.                            The quality of the bowel preparation was adequate                            and good. The appendiceal orifice and the rectum                            were photographed.  Scope In: 8:50:13 AM Scope Out: 9:05:43 AM Scope Withdrawal Time: 0 hours 9 minutes 10 seconds  Total Procedure Duration: 0 hours 15 minutes 30 seconds  Findings:      The perianal and digital rectal examinations were normal.      A 8 mm polyp was found in the cecum. The polyp was multi-lobulated. The       polyp was removed with a hot snare. Resection and retrieval were       complete. Estimated blood loss: none.      Internal hemorrhoids were found during retroflexion. The hemorrhoids       were medium-sized and Grade I (internal hemorrhoids that do not       prolapse). Impression:               - One 8 mm polyp in the cecum, removed with a hot                            snare. Resected and retrieved.                           - Internal hemorrhoids. Moderate Sedation:      N/A - MAC procedure Recommendation:           - Patient has a contact number available for                             emergencies. The signs and symptoms of potential                            delayed complications were discussed with the                            patient. Return to normal activities tomorrow.                            Written discharge instructions were provided to the                            patient.                           - High fiber diet.                           - Await pathology results.                           - Repeat colonoscopy for surveillance based on                            pathology results.                           - No ibuprofen, naproxen, or other non-steroidal                            anti-inflammatory drugs for 2 weeks. Procedure Code(s):        ---  Professional ---                           [phone], Colonoscopy, flexible; with removal of                            tumor(s), polyp(s), or other lesion(s) by snare                            technique Diagnosis Code(s):        --- Professional ---                           Z12.11, Encounter for screening for malignant                            neoplasm of colon                           D12.0, Benign neoplasm of cecum                           K64.0, First degree hemorrhoids CPT copyright 2016 [employer]. All rights reserved. The codes documented in this report are preliminary and upon coder review may  be revised to meet current compliance requirements. Lear Ng, MD 01/30/2017 9:17:38 AM This report has been signed electronically. Number of Addenda: 0

## 2017-01-30 NOTE — Discharge Instructions (Addendum)
YOU HAD AN ENDOSCOPIC PROCEDURE TODAY: Refer to the procedure report and other information in the discharge instructions given to you for any specific questions about what was found during the examination. If this information does not answer your questions, please call [facility] office at [phone] to clarify.   YOU SHOULD EXPECT: Some feelings of bloating in the abdomen. Passage of more gas than usual. Walking can help get rid of the air that was put into your GI tract during the procedure and reduce the bloating. If you had a lower endoscopy (such as a colonoscopy or flexible sigmoidoscopy) you may notice spotting of blood in your stool or on the toilet paper. Some abdominal soreness may be present for a day or two, also.  DIET: Your first meal following the procedure should be a light meal and then it is ok to progress to your normal diet. A half-sandwich or bowl of soup is an example of a good first meal. Heavy or fried foods are harder to digest and may make you feel nauseous or bloated. Drink plenty of fluids but you should avoid alcoholic beverages for 24 hours. If you had a esophageal dilation, please see attached instructions for diet.   ACTIVITY: Your care partner should take you home directly after the procedure. You should plan to take it easy, moving slowly for the rest of the day. You can resume normal activity the day after the procedure however YOU SHOULD NOT DRIVE, use power tools, machinery or perform tasks that involve climbing or major physical exertion for 24 hours (because of the sedation medicines used during the test).   SYMPTOMS TO REPORT IMMEDIATELY: A gastroenterologist can be reached at any hour. Please call [phone]  for any of the following symptoms:  Following lower endoscopy (colonoscopy, flexible sigmoidoscopy) Excessive amounts of blood in the stool  Significant tenderness, worsening of abdominal pains  Swelling of the abdomen that is new, acute  Fever of 100 or  higher  Following upper endoscopy (EGD, EUS, ERCP, esophageal dilation) Vomiting of blood or coffee ground material  New, significant abdominal pain  New, significant chest pain or pain under the shoulder blades  Painful or persistently difficult swallowing  New shortness of breath  Black, tarry-looking or red, bloody stools  FOLLOW UP:  If any biopsies were taken you will be contacted by phone or by letter within the next 1-3 weeks. Call [phone]  if you have not heard about the biopsies in 3 weeks.  Please also call with any specific questions about appointments or follow up tests. Will call you when polyp results are complete.

## 2017-02-02 ENCOUNTER — Encounter (HOSPITAL_COMMUNITY): Payer: Self-pay | Admitting: Gastroenterology

## 2017-02-20 DIAGNOSIS — R309 Painful micturition, unspecified: Secondary | ICD-10-CM | POA: Diagnosis not present

## 2017-02-27 DIAGNOSIS — E119 Type 2 diabetes mellitus without complications: Secondary | ICD-10-CM | POA: Diagnosis not present

## 2017-02-27 DIAGNOSIS — N952 Postmenopausal atrophic vaginitis: Secondary | ICD-10-CM | POA: Diagnosis not present

## 2017-02-27 DIAGNOSIS — N390 Urinary tract infection, site not specified: Secondary | ICD-10-CM | POA: Diagnosis not present

## 2017-04-09 ENCOUNTER — Other Ambulatory Visit (HOSPITAL_COMMUNITY): Payer: Self-pay | Admitting: Psychiatry

## 2017-04-09 DIAGNOSIS — F200 Paranoid schizophrenia: Secondary | ICD-10-CM

## 2017-04-11 ENCOUNTER — Other Ambulatory Visit (HOSPITAL_COMMUNITY): Payer: Self-pay | Admitting: Psychiatry

## 2017-04-11 DIAGNOSIS — F200 Paranoid schizophrenia: Secondary | ICD-10-CM

## 2017-04-22 ENCOUNTER — Ambulatory Visit (HOSPITAL_COMMUNITY): Payer: Self-pay | Admitting: Psychiatry

## 2017-04-28 ENCOUNTER — Other Ambulatory Visit (HOSPITAL_COMMUNITY): Payer: Self-pay | Admitting: Psychiatry

## 2017-04-29 ENCOUNTER — Other Ambulatory Visit (HOSPITAL_COMMUNITY): Payer: Self-pay

## 2017-04-29 DIAGNOSIS — F200 Paranoid schizophrenia: Secondary | ICD-10-CM

## 2017-04-29 MED ORDER — PAROXETINE HCL 40 MG PO TABS: 40.0000 mg | ORAL_TABLET | Freq: Every morning | ORAL | 0 refills | Status: DC

## 2017-05-04 DIAGNOSIS — I10 Essential (primary) hypertension: Secondary | ICD-10-CM | POA: Diagnosis not present

## 2017-05-04 DIAGNOSIS — E119 Type 2 diabetes mellitus without complications: Secondary | ICD-10-CM | POA: Diagnosis not present

## 2017-05-04 DIAGNOSIS — N952 Postmenopausal atrophic vaginitis: Secondary | ICD-10-CM | POA: Diagnosis not present

## 2017-05-04 DIAGNOSIS — M179 Osteoarthritis of knee, unspecified: Secondary | ICD-10-CM | POA: Diagnosis not present

## 2017-05-05 ENCOUNTER — Other Ambulatory Visit (HOSPITAL_COMMUNITY): Payer: Self-pay

## 2017-05-05 ENCOUNTER — Other Ambulatory Visit (HOSPITAL_COMMUNITY): Payer: Self-pay | Admitting: Psychiatry

## 2017-05-05 DIAGNOSIS — F200 Paranoid schizophrenia: Secondary | ICD-10-CM

## 2017-05-05 MED ORDER — CLONAZEPAM 1 MG PO TABS: 1.0000 mg | ORAL_TABLET | Freq: Every day | ORAL | 0 refills | Status: DC

## 2017-05-12 ENCOUNTER — Encounter (HOSPITAL_COMMUNITY): Payer: Self-pay | Admitting: Psychiatry

## 2017-05-12 ENCOUNTER — Ambulatory Visit (INDEPENDENT_AMBULATORY_CARE_PROVIDER_SITE_OTHER): Payer: Medicare Other | Admitting: Psychiatry

## 2017-05-12 DIAGNOSIS — Z811 Family history of alcohol abuse and dependence: Secondary | ICD-10-CM

## 2017-05-12 DIAGNOSIS — Z818 Family history of other mental and behavioral disorders: Secondary | ICD-10-CM | POA: Diagnosis not present

## 2017-05-12 DIAGNOSIS — F200 Paranoid schizophrenia: Secondary | ICD-10-CM

## 2017-05-12 DIAGNOSIS — F411 Generalized anxiety disorder: Secondary | ICD-10-CM | POA: Diagnosis not present

## 2017-05-12 DIAGNOSIS — Z87891 Personal history of nicotine dependence: Secondary | ICD-10-CM | POA: Diagnosis not present

## 2017-05-12 DIAGNOSIS — Z79899 Other long term (current) drug therapy: Secondary | ICD-10-CM | POA: Diagnosis not present

## 2017-05-12 MED ORDER — PAROXETINE HCL 40 MG PO TABS: 40.0000 mg | ORAL_TABLET | Freq: Every morning | ORAL | 0 refills | Status: DC

## 2017-05-12 MED ORDER — TRAZODONE HCL 100 MG PO TABS: 200.0000 mg | ORAL_TABLET | Freq: Every day | ORAL | 0 refills | Status: DC

## 2017-05-12 MED ORDER — ZIPRASIDONE HCL 80 MG PO CAPS: 80.0000 mg | ORAL_CAPSULE | Freq: Every day | ORAL | 0 refills | Status: DC

## 2017-05-12 MED ORDER — CLONAZEPAM 1 MG PO TABS: 1.0000 mg | ORAL_TABLET | Freq: Every day | ORAL | 2 refills | Status: DC

## 2017-05-12 NOTE — Progress Notes (Signed)
BH MD/PA/NP OP Progress Note  05/12/2017 3:52 PM Chelsea [name]  MRN:  [id number]  Chief Complaint: I am taking my medication.  I had a good holidays.  HPI: Chelsea [name] came for her follow-up appointment.  She is taking her medication as prescribed and reported no side effects.  She recently seen her primary care physician and she had blood work.  She was told her hemoglobin A1c is stable.  We do not have the results.  Patient denies any irritability, anger, mania or any psychosis.  She had good Thanksgiving and holidays.  She wants to continue her current medication.  She has no paranoia, delusion or any hallucination.  Patient denies drinking alcohol or using any illegal substances.  She has no tremors, shakes or any EPS.  Visit Diagnosis:    ICD-10-CM   1. Paranoid schizophrenia ([place]) F20.0 ziprasidone (GEODON) 80 MG capsule    traZODone (DESYREL) 100 MG tablet    PARoxetine (PAXIL) 40 MG tablet    clonazePAM (KLONOPIN) 1 MG tablet    Past Psychiatric History: Reviewed. Patient has long history of psychiatric illness. She [facility]. There is no history of suicidal attempt however she has history of mania and psychosis. She admitted talking to the picture and herself in the past. She do not remember what medicine she had tried in the past.   Past Medical History:  Past Medical History:  Diagnosis Date  . Anxiety   . Arthritis   . Bipolar disorder ([place])   . COPD (chronic obstructive pulmonary disease) ([place])   . Diabetes mellitus type II   . Esophageal stricture    moderate distal  . GERD (gastroesophageal reflux disease)   . Heart murmur    "HAD AN ECHO YEARS AGO- NOTHING TO BE CONCERNED ABOUT"  . Hyperlipemia   . Hypertension   . Hypothyroidism   . Mental disorder    Bipolar  . Obesity   . Oxygen desaturation during sleep    wears 2 liters at bedtime   . Schizophrenia [facility])    patient reports that she is unaware of this  . Shortness of  breath    with a little activy  . Sleep apnea    wears 2 liters of oxygen at bedtime instead of CPAP    Past Surgical History:  Procedure Laterality Date  . BALLOON DILATION N/A 06/10/2016   Procedure: BALLOON DILATION;  Surgeon: Wilford Corner, MD;  Location: [facility] ENDOSCOPY;  Service: Endoscopy;  Laterality: N/A;  . BREAST EXCISIONAL BIOPSY Left 05/10/2012   benign  . CHOLECYSTECTOMY    . COLONOSCOPY WITH PROPOFOL N/A 01/30/2017   Procedure: COLONOSCOPY WITH PROPOFOL;  Surgeon: Wilford Corner, MD;  Location: [place];  Service: Endoscopy;  Laterality: N/A;  . ESOPHAGOGASTRODUODENOSCOPY N/A 06/03/2016   Procedure: ESOPHAGOGASTRODUODENOSCOPY (EGD);  Surgeon: Wilford Corner, MD;  Location[facility] ENDOSCOPY;  Service: Endoscopy;  Laterality: N/A;  . ESOPHAGOGASTRODUODENOSCOPY N/A 06/10/2016   Procedure: ESOPHAGOGASTRODUODENOSCOPY (EGD);  Surgeon: Wilford Corner, MD;  Location: [facility] ENDOSCOPY;  Service: Endoscopy;  Laterality: N/A;  . EYE SURGERY    . PARTIAL MASTECTOMY WITH NEEDLE LOCALIZATION Left 05/10/2012   Procedure: PARTIAL MASTECTOMY WITH NEEDLE LOCALIZATION;  Surgeon: Adin Hector, MD;  Location: [facility];  Service: General;  Laterality: Left;  . TONSILLECTOMY      Family Psychiatric History: Viewed.  Family History:  Family History  Problem Relation Age of Onset  . Alcohol abuse Father   . Stroke Father   .  Alzheimer's disease Mother     Social History:  Social History   Socioeconomic History  . Marital status: Divorced    Spouse name: Not on file  . Number of children: 0  . Years of education: HS  . Highest education level: Not on file  Social Needs  . Financial resource strain: Not on file  . Food insecurity - worry: Not on file  . Food insecurity - inability: Not on file  . Transportation needs - medical: Not on file  . Transportation needs - non-medical: Not on file  Occupational History  . Occupation: Retired   Tobacco Use  . Smoking status: Former  Smoker    Years: 29.00    Types: Cigarettes    Last attempt to quit: 05/03/1993    Years since quitting: 24.0  . Smokeless tobacco: Never Used  Substance and Sexual Activity  . Alcohol use: No    Alcohol/week: 0.0 oz  . Drug use: No  . Sexual activity: Not Currently  Other Topics Concern  . Not on file  Social History Narrative   Drinks 2 cups of coffee a day    Allergies:  Allergies  Allergen Reactions  . Darvocet [Propoxyphene N-Acetaminophen] Anaphylaxis    Can take plain tylenol  . Fluoxetine Other (See Comments)    Hallucinations   . Penicillins     REACTION: rash  . Promethazine Hives    Metabolic Disorder Labs: Lab Results  Component Value Date   HGBA1C 6.8 (H) 12/03/2012   MPG 148 (H) 12/03/2012   No results found for: PROLACTIN No results found for: CHOL, TRIG, HDL, CHOLHDL, VLDL, LDLCALC Lab Results  Component Value Date   TSH 1.509 12/03/2012    Therapeutic Level Labs: No results found for: LITHIUM No results found for: VALPROATE No components found for:  CBMZ  Current Medications: Current Outpatient Medications  Medication Sig Dispense Refill  . acetaminophen (TYLENOL) 325 MG tablet Take 2 tablets (650 mg total) by mouth every 6 (six) hours as needed for pain.    Marland Kitchen aspirin 81 MG chewable tablet Chew 81 mg by mouth daily.    Marland Kitchen atorvastatin (LIPITOR) 20 MG tablet Take 20 mg by mouth daily.     . bumetanide (BUMEX) 1 MG tablet Take 1 tablet (1 mg total) by mouth daily. 30 tablet 0  . clonazePAM (KLONOPIN) 1 MG tablet Take 1 tablet (1 mg total) by mouth at bedtime. 30 tablet 0  . fluticasone furoate-vilanterol (BREO ELLIPTA) 200-25 MCG/INH AEPB Inhale 1 puff into the lungs every morning.    . Insulin Detemir (LEVEMIR FLEXPEN) 100 UNIT/ML SOPN Inject 100 Units into the skin at bedtime.     . INVOKANA 300 MG TABS tablet Take 300 mg by mouth daily before breakfast.     . levothyroxine (SYNTHROID) 150 MCG tablet Take 150 mcg by mouth daily.    . metFORMIN  (GLUCOPHAGE) 1000 MG tablet Take 500 mg by mouth 2 (two) times daily with a meal.     . PARoxetine (PAXIL) 40 MG tablet Take 1 tablet (40 mg total) by mouth every morning. 90 tablet 0  . rOPINIRole (REQUIP) 0.25 MG tablet TAKE 3 TABLETS BY MOUTH AT BEDTIME 90 tablet 5  . traZODone (DESYREL) 100 MG tablet Take 2 tablets (200 mg total) by mouth at bedtime. 180 tablet 0  . ziprasidone (GEODON) 80 MG capsule Take 1 capsule (80 mg total) by mouth at bedtime. 90 capsule 0   No current facility-administered medications for this  visit.      Musculoskeletal: Strength & Muscle Tone: within normal limits Gait & Station: normal Patient leans: N/A  Psychiatric Specialty Exam: ROS  Blood pressure 128/76, pulse 77, height 5\' 2"  (1.575 m), weight 256 lb (116.1 kg).There is no height or weight on file to calculate BMI.  General Appearance: Casual  Eye Contact:  Fair  Speech:  Slow  Volume:  Decreased  Mood:  Anxious  Affect:  Appropriate  Thought Process:  Goal Directed  Orientation:  Full (Time, Place, and Person)  Thought Content: Logical   Suicidal Thoughts:  No  Homicidal Thoughts:  No  Memory:  Immediate;   Fair Recent;   Fair Remote;   Fair  Judgement:  Good  Insight:  Good  Psychomotor Activity:  Normal  Concentration:  Concentration: Fair and Attention Span: Fair  Recall:  Good  Fund of Knowledge: Good  Language: Good  Akathisia:  No  Handed:  Right  AIMS (if indicated): not done  Assets:  Communication Skills Desire for Improvement Housing  ADL's:  Intact  Cognition: WNL  Sleep:  Good   Screenings:   Assessment and Plan: Schizophrenia chronic paranoid type.  Generalized anxiety disorder.  Patient is a stable on her current medication.  We will get her consent so we can contact her primary care physician for recent blood work results.  Patient has no side effects.  Continue Paxil 40 mg daily, trazodone 200 mg at bedtime, Klonopin 1 mg at bedtime and Geodon 80 mg at  bedtime.  Discussed medication side effects and benefits.  Patient is not interested in counseling.  Recommended to call us back if she has any question or any concern.  Follow-up in 3 months.   Kathlee Nations, MD 05/12/2017, 3:52 PM

## 2017-05-18 ENCOUNTER — Other Ambulatory Visit: Payer: Self-pay | Admitting: Family Medicine

## 2017-05-18 DIAGNOSIS — Z1231 Encounter for screening mammogram for malignant neoplasm of breast: Secondary | ICD-10-CM

## 2017-06-22 ENCOUNTER — Ambulatory Visit
Admission: RE | Admit: 2017-06-22 | Discharge: 2017-06-22 | Disposition: A | Discharge status: Home / Self Care | Payer: Medicare Other | Source: Ambulatory Visit | Attending: Family Medicine | Admitting: Family Medicine

## 2017-06-22 DIAGNOSIS — Z1231 Encounter for screening mammogram for malignant neoplasm of breast: Secondary | ICD-10-CM | POA: Diagnosis not present

## 2017-06-29 ENCOUNTER — Other Ambulatory Visit: Payer: Self-pay | Admitting: Neurology

## 2017-06-29 NOTE — Telephone Encounter (Signed)
Pt needs an appt for further  refills 

## 2017-07-22 DIAGNOSIS — E113293 Type 2 diabetes mellitus with mild nonproliferative diabetic retinopathy without macular edema, bilateral: Secondary | ICD-10-CM | POA: Diagnosis not present

## 2017-07-22 DIAGNOSIS — H524 Presbyopia: Secondary | ICD-10-CM | POA: Diagnosis not present

## 2017-07-22 DIAGNOSIS — H353131 Nonexudative age-related macular degeneration, bilateral, early dry stage: Secondary | ICD-10-CM | POA: Diagnosis not present

## 2017-07-22 DIAGNOSIS — H25813 Combined forms of age-related cataract, bilateral: Secondary | ICD-10-CM | POA: Diagnosis not present

## 2017-07-22 DIAGNOSIS — H52223 Regular astigmatism, bilateral: Secondary | ICD-10-CM | POA: Diagnosis not present

## 2017-07-22 DIAGNOSIS — H5203 Hypermetropia, bilateral: Secondary | ICD-10-CM | POA: Diagnosis not present

## 2017-07-22 DIAGNOSIS — H35033 Hypertensive retinopathy, bilateral: Secondary | ICD-10-CM | POA: Diagnosis not present

## 2017-07-28 ENCOUNTER — Other Ambulatory Visit (HOSPITAL_COMMUNITY): Payer: Self-pay | Admitting: Psychiatry

## 2017-07-28 DIAGNOSIS — F200 Paranoid schizophrenia: Secondary | ICD-10-CM

## 2017-07-29 DIAGNOSIS — B372 Candidiasis of skin and nail: Secondary | ICD-10-CM | POA: Diagnosis not present

## 2017-07-31 DIAGNOSIS — I10 Essential (primary) hypertension: Secondary | ICD-10-CM | POA: Diagnosis not present

## 2017-07-31 DIAGNOSIS — N19 Unspecified kidney failure: Secondary | ICD-10-CM | POA: Diagnosis not present

## 2017-07-31 DIAGNOSIS — E559 Vitamin D deficiency, unspecified: Secondary | ICD-10-CM | POA: Diagnosis not present

## 2017-07-31 DIAGNOSIS — E119 Type 2 diabetes mellitus without complications: Secondary | ICD-10-CM | POA: Diagnosis not present

## 2017-07-31 DIAGNOSIS — Z79899 Other long term (current) drug therapy: Secondary | ICD-10-CM | POA: Diagnosis not present

## 2017-07-31 DIAGNOSIS — E039 Hypothyroidism, unspecified: Secondary | ICD-10-CM | POA: Diagnosis not present

## 2017-07-31 DIAGNOSIS — E782 Mixed hyperlipidemia: Secondary | ICD-10-CM | POA: Diagnosis not present

## 2017-08-03 ENCOUNTER — Ambulatory Visit (HOSPITAL_COMMUNITY): Payer: Self-pay | Admitting: Psychiatry

## 2017-08-03 DIAGNOSIS — N390 Urinary tract infection, site not specified: Secondary | ICD-10-CM | POA: Diagnosis not present

## 2017-08-03 DIAGNOSIS — E119 Type 2 diabetes mellitus without complications: Secondary | ICD-10-CM | POA: Diagnosis not present

## 2017-08-03 DIAGNOSIS — E782 Mixed hyperlipidemia: Secondary | ICD-10-CM | POA: Diagnosis not present

## 2017-08-03 DIAGNOSIS — I10 Essential (primary) hypertension: Secondary | ICD-10-CM | POA: Diagnosis not present

## 2017-08-03 DIAGNOSIS — E039 Hypothyroidism, unspecified: Secondary | ICD-10-CM | POA: Diagnosis not present

## 2017-08-03 DIAGNOSIS — Z6841 Body Mass Index (BMI) 40.0 and over, adult: Secondary | ICD-10-CM | POA: Diagnosis not present

## 2017-08-06 ENCOUNTER — Encounter (HOSPITAL_COMMUNITY): Payer: Self-pay | Admitting: Psychiatry

## 2017-08-06 ENCOUNTER — Ambulatory Visit (INDEPENDENT_AMBULATORY_CARE_PROVIDER_SITE_OTHER): Payer: Medicare Other | Admitting: Psychiatry

## 2017-08-06 DIAGNOSIS — Z810 Family history of intellectual disabilities: Secondary | ICD-10-CM

## 2017-08-06 DIAGNOSIS — F200 Paranoid schizophrenia: Secondary | ICD-10-CM | POA: Diagnosis not present

## 2017-08-06 DIAGNOSIS — Z87891 Personal history of nicotine dependence: Secondary | ICD-10-CM

## 2017-08-06 DIAGNOSIS — Z811 Family history of alcohol abuse and dependence: Secondary | ICD-10-CM

## 2017-08-06 DIAGNOSIS — F411 Generalized anxiety disorder: Secondary | ICD-10-CM | POA: Diagnosis not present

## 2017-08-06 DIAGNOSIS — R450 Nervousness: Secondary | ICD-10-CM | POA: Diagnosis not present

## 2017-08-06 MED ORDER — PAROXETINE HCL 40 MG PO TABS: 40.0000 mg | ORAL_TABLET | Freq: Every morning | ORAL | 0 refills | Status: DC

## 2017-08-06 MED ORDER — ZIPRASIDONE HCL 80 MG PO CAPS: 80.0000 mg | ORAL_CAPSULE | Freq: Every day | ORAL | 0 refills | Status: DC

## 2017-08-06 MED ORDER — CLONAZEPAM 1 MG PO TABS: 1.0000 mg | ORAL_TABLET | Freq: Every day | ORAL | 2 refills | Status: DC

## 2017-08-06 MED ORDER — TRAZODONE HCL 100 MG PO TABS: 200.0000 mg | ORAL_TABLET | Freq: Every day | ORAL | 0 refills | Status: DC

## 2017-08-06 NOTE — Progress Notes (Signed)
BH MD/PA/NP OP Progress Note  08/06/2017 12:45 PM Chelsea [name]  MRN:  [id number]  Chief Complaint: I am taking my medication.  HPI: Chelsea [name] came for her follow-up appointment.  She is taking the medication as prescribed.  She admitted lately more anxious because she is talking to her friend who is going through depression and sometimes she feel sucked in the situation.  She endorsed there are nights that she do not sleep very well.  She also endorsed emotional eating and recently her hemoglobin A1c jumped to 7.7.  However she is losing weight.  She is happy about it.  She recently seen her primary care physician Dr. Ernie Hew.  She denies any paranoia, hallucination, suicidal thoughts.  Her energy level is fair.  She denies any crying spells or any feeling of hopelessness or worthlessness.  She has no tremors shakes or any EPS.  She wants to continue her current medication.  Visit Diagnosis:    ICD-10-CM   1. Paranoid schizophrenia ([place]) F20.0 ziprasidone (GEODON) 80 MG capsule    traZODone (DESYREL) 100 MG tablet    PARoxetine (PAXIL) 40 MG tablet    clonazePAM (KLONOPIN) 1 MG tablet    Past Psychiatric History: Reviewed Patient has long history of psychiatric illness. She [facility] at [facility] and [facility]. There is no history of suicidal attempt however she has history of mania and psychosis. She admitted talking to the picture and herself in the past. She do not remember what medicine she had tried in the past.   Past Medical History:  Past Medical History:  Diagnosis Date  . Anxiety   . Arthritis   . Bipolar disorder ([place])   . COPD (chronic obstructive pulmonary disease) ([place])   . Diabetes mellitus type II   . Esophageal stricture    moderate distal  . GERD (gastroesophageal reflux disease)   . Heart murmur    "HAD AN ECHO YEARS AGO- NOTHING TO BE CONCERNED ABOUT"  . Hyperlipemia   . Hypertension   . Hypothyroidism   . Mental disorder    Bipolar  . Obesity   .  Oxygen desaturation during sleep    wears 2 liters at bedtime   . Schizophrenia [facility])    patient reports that she is unaware of this  . Shortness of breath    with a little activy  . Sleep apnea    wears 2 liters of oxygen at bedtime instead of CPAP    Past Surgical History:  Procedure Laterality Date  . BALLOON DILATION N/A 06/10/2016   Procedure: BALLOON DILATION;  Surgeon: Wilford Corner, MD;  Location: [facility] ENDOSCOPY;  Service: Endoscopy;  Laterality: N/A;  . BREAST EXCISIONAL BIOPSY Left 05/10/2012   benign  . CHOLECYSTECTOMY    . COLONOSCOPY WITH PROPOFOL N/A 01/30/2017   Procedure: COLONOSCOPY WITH PROPOFOL;  Surgeon: Wilford Corner, MD;  Location: [place];  Service: Endoscopy;  Laterality: N/A;  . ESOPHAGOGASTRODUODENOSCOPY N/A 06/03/2016   Procedure: ESOPHAGOGASTRODUODENOSCOPY (EGD);  Surgeon: Wilford Corner, MD;  Location: [facility] ENDOSCOPY;  Service: Endoscopy;  Laterality: N/A;  . ESOPHAGOGASTRODUODENOSCOPY N/A 06/10/2016   Procedure: ESOPHAGOGASTRODUODENOSCOPY (EGD);  Surgeon: Wilford Corner, MD;  Location: [facility] ENDOSCOPY;  Service: Endoscopy;  Laterality: N/A;  . EYE SURGERY    . PARTIAL MASTECTOMY WITH NEEDLE LOCALIZATION Left 05/10/2012   Procedure: PARTIAL MASTECTOMY WITH NEEDLE LOCALIZATION;  Surgeon: Adin Hector, MD;  Location: [place];  Service: General;  Laterality: Left;  . TONSILLECTOMY      Family Psychiatric  History: Reviewed.  Family History:  Family History  Problem Relation Age of Onset  . Alcohol abuse Father   . Stroke Father   . Alzheimer's disease Mother     Social History:  Social History   Socioeconomic History  . Marital status: Divorced    Spouse name: Not on file  . Number of children: 0  . Years of education: HS  . Highest education level: Not on file  Occupational History  . Occupation: Retired   Scientific laboratory technician  . Financial resource strain: Not on file  . Food insecurity:    Worry: Not on file    Inability: Not on file  .  Transportation needs:    Medical: Not on file    Non-medical: Not on file  Tobacco Use  . Smoking status: Former Smoker    Years: 29.00    Types: Cigarettes    Last attempt to quit: 05/03/1993    Years since quitting: 24.2  . Smokeless tobacco: Never Used  Substance and Sexual Activity  . Alcohol use: No    Alcohol/week: 0.0 oz  . Drug use: No  . Sexual activity: Not Currently  Lifestyle  . Physical activity:    Days per week: Not on file    Minutes per session: Not on file  . Stress: Not on file  Relationships  . Social connections:    Talks on phone: Not on file    Gets together: Not on file    Attends religious service: Not on file    Active member of club or organization: Not on file    Attends meetings of clubs or organizations: Not on file    Relationship status: Not on file  Other Topics Concern  . Not on file  Social History Narrative   Drinks 2 cups of coffee a day    Allergies:  Allergies  Allergen Reactions  . Darvocet [Propoxyphene N-Acetaminophen] Anaphylaxis    Can take plain tylenol  . Fluoxetine Other (See Comments)    Hallucinations   . Penicillins     REACTION: rash  . Promethazine Hives    Metabolic Disorder Labs: Lab Results  Component Value Date   HGBA1C 6.8 (H) 12/03/2012   MPG 148 (H) 12/03/2012   No results found for: PROLACTIN No results found for: CHOL, TRIG, HDL, CHOLHDL, VLDL, LDLCALC Lab Results  Component Value Date   TSH 1.509 12/03/2012    Therapeutic Level Labs: No results found for: LITHIUM No results found for: VALPROATE No components found for:  CBMZ  Current Medications: Current Outpatient Medications  Medication Sig Dispense Refill  . acetaminophen (TYLENOL) 325 MG tablet Take 2 tablets (650 mg total) by mouth every 6 (six) hours as needed for pain.    Marland Kitchen aspirin 81 MG chewable tablet Chew 81 mg by mouth daily.    Marland Kitchen atorvastatin (LIPITOR) 20 MG tablet Take 20 mg by mouth daily.     . bumetanide (BUMEX) 1 MG  tablet Take 1 tablet (1 mg total) by mouth daily. 30 tablet 0  . clonazePAM (KLONOPIN) 1 MG tablet Take 1 tablet (1 mg total) by mouth at bedtime. 30 tablet 2  . fluticasone furoate-vilanterol (BREO ELLIPTA) 200-25 MCG/INH AEPB Inhale 1 puff into the lungs every morning.    . Insulin Detemir (LEVEMIR FLEXPEN) 100 UNIT/ML SOPN Inject 100 Units into the skin at bedtime.     . INVOKANA 300 MG TABS tablet Take 300 mg by mouth daily before breakfast.     .  levothyroxine (SYNTHROID) 150 MCG tablet Take 150 mcg by mouth daily.    . metFORMIN (GLUCOPHAGE) 1000 MG tablet Take 500 mg by mouth 2 (two) times daily with a meal.     . PARoxetine (PAXIL) 40 MG tablet Take 1 tablet (40 mg total) by mouth every morning. 90 tablet 0  . rOPINIRole (REQUIP) 0.25 MG tablet TAKE 3 TABLETS BY MOUTH AT BEDTIME 90 tablet 0  . rosuvastatin (CRESTOR) 20 MG tablet Take 20 mg by mouth daily.    . traZODone (DESYREL) 100 MG tablet Take 2 tablets (200 mg total) by mouth at bedtime. 180 tablet 0  . ziprasidone (GEODON) 80 MG capsule Take 1 capsule (80 mg total) by mouth at bedtime. 90 capsule 0   No current facility-administered medications for this visit.      Musculoskeletal: Strength & Muscle Tone: within normal limits Gait & Station: normal Patient leans: N/A  Psychiatric Specialty Exam: Review of Systems  Constitutional: Positive for weight loss.  Psychiatric/Behavioral: The patient is nervous/anxious.     Blood pressure (!) 142/72, pulse 90, height 5\' 2"  (1.575 m), weight 221 lb 11.2 oz (100.6 kg).Body mass index is 40.55 kg/m.  General Appearance: Casual  Eye Contact:  Fair  Speech:  Slow  Volume:  Decreased  Mood:  Anxious  Affect:  Constricted  Thought Process:  Goal Directed  Orientation:  Full (Time, Place, and Person)  Thought Content: Logical   Suicidal Thoughts:  No  Homicidal Thoughts:  No  Memory:  Immediate;   Fair Recent;   Fair Remote;   Good  Judgement:  Good  Insight:  Good   Psychomotor Activity:  Decreased  Concentration:  Concentration: Fair and Attention Span: Fair  Recall:  Good  Fund of Knowledge: Good  Language: Fair  Akathisia:  No  Handed:  Right  AIMS (if indicated): not done  Assets:  Communication Skills Desire for Improvement Housing Resilience  ADL's:  Intact  Cognition: WNL  Sleep:  Good   Screenings:   Assessment and Plan: Schizophrenia chronic paranoid type.  Generalized anxiety disorder.  Discussed medication side effects.  Patient lost a lot of weight she recently saw her primary care physician for high hemoglobin A1c.  Her metformin is adjusted.  Patient does not want to change her psychiatric medication.  She is not interested in counseling.  Continue Paxil 40 mg daily, trazodone 200 mg at bedtime, Klonopin 1 mg at bedtime and Geodon 80 mg at bedtime.  We will get records from her primary care physician.  Follow-up in 3 months.  Recommended to call us back if she has any question, concern if she feels worsening of the symptoms.     Kathlee Nations, MD 08/06/2017, 12:45 PM

## 2017-08-14 DIAGNOSIS — Z6839 Body mass index (BMI) 39.0-39.9, adult: Secondary | ICD-10-CM | POA: Diagnosis not present

## 2017-08-14 DIAGNOSIS — R05 Cough: Secondary | ICD-10-CM | POA: Diagnosis not present

## 2017-08-14 DIAGNOSIS — J069 Acute upper respiratory infection, unspecified: Secondary | ICD-10-CM | POA: Diagnosis not present

## 2017-09-02 ENCOUNTER — Other Ambulatory Visit: Payer: Self-pay | Admitting: Neurology

## 2017-09-14 DIAGNOSIS — E119 Type 2 diabetes mellitus without complications: Secondary | ICD-10-CM | POA: Diagnosis not present

## 2017-09-14 DIAGNOSIS — E782 Mixed hyperlipidemia: Secondary | ICD-10-CM | POA: Diagnosis not present

## 2017-09-14 DIAGNOSIS — I10 Essential (primary) hypertension: Secondary | ICD-10-CM | POA: Diagnosis not present

## 2017-09-14 DIAGNOSIS — Z6841 Body Mass Index (BMI) 40.0 and over, adult: Secondary | ICD-10-CM | POA: Diagnosis not present

## 2017-09-14 DIAGNOSIS — E039 Hypothyroidism, unspecified: Secondary | ICD-10-CM | POA: Diagnosis not present

## 2017-10-10 ENCOUNTER — Other Ambulatory Visit: Payer: Self-pay | Admitting: Neurology

## 2017-10-21 DIAGNOSIS — Z6839 Body mass index (BMI) 39.0-39.9, adult: Secondary | ICD-10-CM | POA: Diagnosis not present

## 2017-10-21 DIAGNOSIS — Z1211 Encounter for screening for malignant neoplasm of colon: Secondary | ICD-10-CM | POA: Diagnosis not present

## 2017-10-21 DIAGNOSIS — Z0000 Encounter for general adult medical examination without abnormal findings: Secondary | ICD-10-CM | POA: Diagnosis not present

## 2017-10-26 ENCOUNTER — Other Ambulatory Visit (HOSPITAL_COMMUNITY): Payer: Self-pay | Admitting: Psychiatry

## 2017-10-26 DIAGNOSIS — F200 Paranoid schizophrenia: Secondary | ICD-10-CM

## 2017-11-03 ENCOUNTER — Ambulatory Visit: Payer: Medicare Other | Admitting: Adult Health

## 2017-11-05 ENCOUNTER — Encounter: Payer: Self-pay | Admitting: Adult Health

## 2017-11-05 ENCOUNTER — Ambulatory Visit (INDEPENDENT_AMBULATORY_CARE_PROVIDER_SITE_OTHER): Payer: Medicare Other | Admitting: Adult Health

## 2017-11-05 VITALS — BP 122/64 | HR 78 | Ht 62.0 in | Wt 217.0 lb

## 2017-11-05 DIAGNOSIS — G2581 Restless legs syndrome: Secondary | ICD-10-CM | POA: Diagnosis not present

## 2017-11-05 MED ORDER — ROPINIROLE HCL 0.25 MG PO TABS: 0.7500 mg | ORAL_TABLET | Freq: Every day | ORAL | 5 refills | Status: DC

## 2017-11-05 NOTE — Patient Instructions (Signed)
Your Plan:  Restart requip 0.25- 3 tablets at bedtime If your symptoms worsen or you develop new symptoms please let us know.   Thank you for coming to see [place] at [facility]. I hope we have been able to provide you high quality care today.  You may receive a patient satisfaction survey over the next few weeks. We would appreciate your feedback and comments so that we may continue to improve ourselves and the health of our patients.

## 2017-11-05 NOTE — Progress Notes (Addendum)
PATIENT: Chelsea [name] DOB: 1950-06-22  REASON FOR VISIT: follow up HISTORY FROM: patient  HISTORY OF PRESENT ILLNESS: Today 11/05/17:  Chelsea [name] is a 67 year old female with a history of restless leg syndrome.  The patient states that she was doing well on Requip however she has been out of medication since May.  She states that since she has been off the medication her restless legs have gotten worse.  She states that her symptoms primarily affect her at bedtime.  Reports that she has a hard time falling asleep due to restless legs.  She denies any new neurological symptoms.  She returns today for an evaluation.   HISTORY /07/22/2016 Copied From Dr. Guadelupe Sabin note: She reports doing well with Requip. She feels that is has helped. She takes 0.25 mg strength 3 pills at night. She has no side effects including no significant daytime somnolence, nausea or vomiting, hallucinations, or impulse control disorder. She brings in a copy of her latest blood work from her primary care physician. This was blood work from 05/13/2016. A1c was 7.8 which is slightly increased, otherwise CMP was normal, TSH normal.  She went to the emergency room in March secondary to food stuck in her throat. She had esophageal dilatation at the time.   REVIEW OF SYSTEMS: Out of a complete 14 system review of symptoms, the patient complains only of the following symptoms, and all other reviewed systems are negative.  ALLERGIES: Allergies  Allergen Reactions  . Darvocet [Propoxyphene N-Acetaminophen] Anaphylaxis    Can take plain tylenol  . Fluoxetine Other (See Comments)    Hallucinations   . Penicillins     REACTION: rash  . Promethazine Hives    HOME MEDICATIONS: Outpatient Medications Prior to Visit  Medication Sig Dispense Refill  . aspirin 81 MG chewable tablet Chew 81 mg by mouth daily.    . bumetanide (BUMEX) 1 MG tablet Take 1 tablet (1 mg total) by mouth daily. 30 tablet 0  . clonazePAM (KLONOPIN)  1 MG tablet Take 1 tablet (1 mg total) by mouth at bedtime. 30 tablet 2  . fluticasone furoate-vilanterol (BREO ELLIPTA) 200-25 MCG/INH AEPB Inhale 1 puff into the lungs every morning.    . Insulin Detemir (LEVEMIR FLEXPEN) 100 UNIT/ML SOPN Inject 100 Units into the skin at bedtime.     . INVOKANA 300 MG TABS tablet Take 300 mg by mouth daily before breakfast.     . levothyroxine (SYNTHROID) 150 MCG tablet Take 150 mcg by mouth daily.    . metFORMIN (GLUCOPHAGE) 1000 MG tablet Take 500 mg by mouth 2 (two) times daily with a meal.     . PARoxetine (PAXIL) 40 MG tablet Take 1 tablet (40 mg total) by mouth every morning. 90 tablet 0  . rosuvastatin (CRESTOR) 20 MG tablet Take 20 mg by mouth daily.    . traZODone (DESYREL) 100 MG tablet Take 2 tablets (200 mg total) by mouth at bedtime. 180 tablet 0  . ziprasidone (GEODON) 80 MG capsule Take 1 capsule (80 mg total) by mouth at bedtime. 90 capsule 0  . rOPINIRole (REQUIP) 0.25 MG tablet TAKE 3 TABLETS BY MOUTH AT BEDTIME (Patient not taking: Reported on 11/05/2017) 90 tablet 0  . acetaminophen (TYLENOL) 325 MG tablet Take 2 tablets (650 mg total) by mouth every 6 (six) hours as needed for pain.    Marland Kitchen atorvastatin (LIPITOR) 20 MG tablet Take 20 mg by mouth daily.      No facility-administered medications prior  to visit.     PAST MEDICAL HISTORY: Past Medical History:  Diagnosis Date  . Anxiety   . Arthritis   . Bipolar disorder ([place])   . COPD (chronic obstructive pulmonary disease) ([place])   . Diabetes mellitus type II   . Esophageal stricture    moderate distal  . GERD (gastroesophageal reflux disease)   . Heart murmur    "HAD AN ECHO YEARS AGO- NOTHING TO BE CONCERNED ABOUT"  . Hyperlipemia   . Hypertension   . Hypothyroidism   . Mental disorder    Bipolar  . Obesity   . Oxygen desaturation during sleep    wears 2 liters at bedtime   . Schizophrenia [facility])    patient reports that she is unaware of this  . Shortness of breath    with a  little activy  . Sleep apnea    wears 2 liters of oxygen at bedtime instead of CPAP    PAST SURGICAL HISTORY: Past Surgical History:  Procedure Laterality Date  . BALLOON DILATION N/A 06/10/2016   Procedure: BALLOON DILATION;  Surgeon: Wilford Corner, MD;  Location: [facility] ENDOSCOPY;  Service: Endoscopy;  Laterality: N/A;  . BREAST EXCISIONAL BIOPSY Left 05/10/2012   benign  . CHOLECYSTECTOMY    . COLONOSCOPY WITH PROPOFOL N/A 01/30/2017   Procedure: COLONOSCOPY WITH PROPOFOL;  Surgeon: Wilford Corner, MD;  Location: [place];  Service: Endoscopy;  Laterality: N/A;  . ESOPHAGOGASTRODUODENOSCOPY N/A 06/03/2016   Procedure: ESOPHAGOGASTRODUODENOSCOPY (EGD);  Surgeon: Wilford Corner, MD;  Location[facility] ENDOSCOPY;  Service: Endoscopy;  Laterality: N/A;  . ESOPHAGOGASTRODUODENOSCOPY N/A 06/10/2016   Procedure: ESOPHAGOGASTRODUODENOSCOPY (EGD);  Surgeon: Wilford Corner, MD;  Location: [facility] ENDOSCOPY;  Service: Endoscopy;  Laterality: N/A;  . EYE SURGERY    . PARTIAL MASTECTOMY WITH NEEDLE LOCALIZATION Left 05/10/2012   Procedure: PARTIAL MASTECTOMY WITH NEEDLE LOCALIZATION;  Surgeon: Adin Hector, MD;  Location: [place];  Service: General;  Laterality: Left;  . TONSILLECTOMY      FAMILY HISTORY: Family History  Problem Relation Age of Onset  . Alcohol abuse Father   . Stroke Father   . Alzheimer's disease Mother     SOCIAL HISTORY: Social History   Socioeconomic History  . Marital status: Divorced    Spouse name: Not on file  . Number of children: 0  . Years of education: HS  . Highest education level: Not on file  Occupational History  . Occupation: Retired   Scientific laboratory technician  . Financial resource strain: Not on file  . Food insecurity:    Worry: Not on file    Inability: Not on file  . Transportation needs:    Medical: Not on file    Non-medical: Not on file  Tobacco Use  . Smoking status: Former Smoker    Years: 29.00    Types: Cigarettes    Last attempt to quit:  05/03/1993    Years since quitting: 24.5  . Smokeless tobacco: Never Used  Substance and Sexual Activity  . Alcohol use: No    Alcohol/week: 0.0 [name] drinks  . Drug use: No  . Sexual activity: Not Currently  Lifestyle  . Physical activity:    Days per week: Not on file    Minutes per session: Not on file  . Stress: Not on file  Relationships  . Social connections:    Talks on phone: Not on file    Gets together: Not on file    Attends religious service: Not on file  Active member of club or organization: Not on file    Attends meetings of clubs or organizations: Not on file    Relationship status: Not on file  . Intimate partner violence:    Fear of current or ex partner: Not on file    Emotionally abused: Not on file    Physically abused: Not on file    Forced sexual activity: Not on file  Other Topics Concern  . Not on file  Social History Narrative   Drinks 2 cups of coffee a day      PHYSICAL EXAM  Vitals:   11/05/17 1432  BP: 122/64  Pulse: 78  Weight: 217 lb (98.4 kg)  Height: 5\' 2"  (1.575 m)   Body mass index is 39.69 kg/m.  Generalized: Well developed, in no acute distress   Neurological examination  Mentation: Alert oriented to time, place, history taking. Follows all commands speech and language fluent Cranial nerve II-XII: Pupils were equal round reactive to light. Extraocular movements were full, visual field were full on confrontational test. Facial sensation and strength were normal. Uvula tongue midline. Head turning and shoulder shrug  were normal and symmetric. Motor: The motor testing reveals 5 over 5 strength of all 4 extremities. Good symmetric motor tone is noted throughout.  Sensory: Sensory testing is intact to soft touch on all 4 extremities. No evidence of extinction is noted.  Coordination: Cerebellar testing reveals good finger-nose-finger and heel-to-shin bilaterally.  Gait and station: Gait is normal.  Reflexes: Deep tendon  reflexes are symmetric and normal bilaterally.   DIAGNOSTIC DATA (LABS, IMAGING, TESTING) - I reviewed patient records, labs, notes, testing and imaging myself where available.  Lab Results  Component Value Date   WBC 9.0 06/02/2016   HGB 12.4 06/02/2016   HCT 38.4 06/02/2016   MCV 86.9 06/02/2016   PLT 203 06/02/2016      Component Value Date/Time   NA 138 06/02/2016 2343   K 3.4 (L) 06/02/2016 2343   CL 98 (L) 06/02/2016 2343   CO2 29 06/02/2016 2343   GLUCOSE 122 (H) 06/02/2016 2343   BUN 27 (H) 06/02/2016 2343   CREATININE 1.61 (H) 06/02/2016 2343   CALCIUM 10.1 06/02/2016 2343   PROT 5.9 (L) 12/04/2012 0512   ALBUMIN 2.9 (L) 12/04/2012 0512   AST 10 12/04/2012 0512   ALT 8 12/04/2012 0512   ALKPHOS 67 12/04/2012 0512   BILITOT 0.2 (L) 12/04/2012 0512   GFRNONAA 33 (L) 06/02/2016 2343   GFRAA 38 (L) 06/02/2016 2343   No results found for: CHOL, HDL, LDLCALC, LDLDIRECT, TRIG, CHOLHDL Lab Results  Component Value Date   HGBA1C 6.8 (H) 12/03/2012   No results found for: VITAMINB12 Lab Results  Component Value Date   TSH 1.509 12/03/2012      ASSESSMENT AND PLAN 67 y.o. year old female  has a past medical history of Anxiety, Arthritis, Bipolar disorder ([place]), COPD (chronic obstructive pulmonary disease) ([place]), Diabetes mellitus type II, Esophageal stricture, GERD (gastroesophageal reflux disease), Heart murmur, Hyperlipemia, Hypertension, Hypothyroidism, Mental disorder, Obesity, Oxygen desaturation during sleep, Schizophrenia ([place]), Shortness of breath, and Sleep apnea. here with:  1.  Restless leg syndrome  The patient will be restarted on Requip 0.75 at bedtime.  I have advised the patient that if her symptoms worsen or she develops new symptoms she should let us know.  We reviewed potential side effects of the medication.  She is advised that if her symptoms worsen or she develops new symptoms she  should let us know.  She will follow-up in 6 months or sooner if  needed.  I spent 15 minutes with the patient. 50% of this time was spent reviewing her medication   Ward Givens, MSN, NP-C 11/05/2017, 2:53 PM [facility] [address] [place], [place] [address] [phone]  I reviewed the above note and documentation by the Nurse Practitioner and agree with the history, physical exam, assessment and plan as outlined above. I was immediately available for face-to-face consultation. Star Age, MD, PhD [facility])

## 2017-11-09 ENCOUNTER — Ambulatory Visit (HOSPITAL_COMMUNITY): Payer: Self-pay | Admitting: Psychiatry

## 2017-11-23 ENCOUNTER — Other Ambulatory Visit: Payer: Self-pay | Admitting: Family Medicine

## 2017-11-23 ENCOUNTER — Ambulatory Visit
Admission: RE | Admit: 2017-11-23 | Discharge: 2017-11-23 | Disposition: A | Discharge status: Home / Self Care | Payer: Medicare Other | Source: Ambulatory Visit | Attending: Family Medicine | Admitting: Family Medicine

## 2017-11-23 DIAGNOSIS — D649 Anemia, unspecified: Secondary | ICD-10-CM | POA: Diagnosis not present

## 2017-11-23 DIAGNOSIS — E119 Type 2 diabetes mellitus without complications: Secondary | ICD-10-CM | POA: Diagnosis not present

## 2017-11-23 DIAGNOSIS — R0600 Dyspnea, unspecified: Secondary | ICD-10-CM

## 2017-11-23 DIAGNOSIS — J069 Acute upper respiratory infection, unspecified: Secondary | ICD-10-CM | POA: Diagnosis not present

## 2017-11-23 DIAGNOSIS — R05 Cough: Secondary | ICD-10-CM | POA: Diagnosis not present

## 2017-11-23 DIAGNOSIS — R5383 Other fatigue: Secondary | ICD-10-CM | POA: Diagnosis not present

## 2017-11-23 DIAGNOSIS — E559 Vitamin D deficiency, unspecified: Secondary | ICD-10-CM | POA: Diagnosis not present

## 2017-11-23 DIAGNOSIS — Z6839 Body mass index (BMI) 39.0-39.9, adult: Secondary | ICD-10-CM | POA: Diagnosis not present

## 2017-11-23 DIAGNOSIS — N19 Unspecified kidney failure: Secondary | ICD-10-CM | POA: Diagnosis not present

## 2017-11-27 DIAGNOSIS — F329 Major depressive disorder, single episode, unspecified: Secondary | ICD-10-CM | POA: Diagnosis not present

## 2017-11-27 DIAGNOSIS — R300 Dysuria: Secondary | ICD-10-CM | POA: Diagnosis not present

## 2017-11-27 DIAGNOSIS — E559 Vitamin D deficiency, unspecified: Secondary | ICD-10-CM | POA: Diagnosis not present

## 2017-11-27 DIAGNOSIS — N390 Urinary tract infection, site not specified: Secondary | ICD-10-CM | POA: Diagnosis not present

## 2017-11-27 DIAGNOSIS — Z6839 Body mass index (BMI) 39.0-39.9, adult: Secondary | ICD-10-CM | POA: Diagnosis not present

## 2017-11-27 DIAGNOSIS — E119 Type 2 diabetes mellitus without complications: Secondary | ICD-10-CM | POA: Diagnosis not present

## 2017-11-27 DIAGNOSIS — Z23 Encounter for immunization: Secondary | ICD-10-CM | POA: Diagnosis not present

## 2017-11-27 DIAGNOSIS — R0600 Dyspnea, unspecified: Secondary | ICD-10-CM | POA: Diagnosis not present

## 2017-12-01 ENCOUNTER — Other Ambulatory Visit (HOSPITAL_COMMUNITY): Payer: Self-pay

## 2017-12-01 DIAGNOSIS — F200 Paranoid schizophrenia: Secondary | ICD-10-CM

## 2017-12-01 MED ORDER — ZIPRASIDONE HCL 80 MG PO CAPS: 80.0000 mg | ORAL_CAPSULE | Freq: Every day | ORAL | 0 refills | Status: DC

## 2017-12-01 MED ORDER — CLONAZEPAM 1 MG PO TABS: 1.0000 mg | ORAL_TABLET | Freq: Every day | ORAL | 2 refills | Status: DC

## 2017-12-01 MED ORDER — TRAZODONE HCL 100 MG PO TABS: 200.0000 mg | ORAL_TABLET | Freq: Every day | ORAL | 0 refills | Status: DC

## 2017-12-09 ENCOUNTER — Ambulatory Visit (INDEPENDENT_AMBULATORY_CARE_PROVIDER_SITE_OTHER): Payer: Medicare Other | Admitting: Psychiatry

## 2017-12-09 ENCOUNTER — Encounter (HOSPITAL_COMMUNITY): Payer: Self-pay | Admitting: Psychiatry

## 2017-12-09 VITALS — BP 131/72 | HR 103 | Temp 97.5°F | Ht 62.0 in | Wt 217.0 lb

## 2017-12-09 DIAGNOSIS — Z87891 Personal history of nicotine dependence: Secondary | ICD-10-CM | POA: Diagnosis not present

## 2017-12-09 DIAGNOSIS — Z811 Family history of alcohol abuse and dependence: Secondary | ICD-10-CM | POA: Diagnosis not present

## 2017-12-09 DIAGNOSIS — F3289 Other specified depressive episodes: Secondary | ICD-10-CM | POA: Diagnosis not present

## 2017-12-09 DIAGNOSIS — F411 Generalized anxiety disorder: Secondary | ICD-10-CM

## 2017-12-09 DIAGNOSIS — Z79899 Other long term (current) drug therapy: Secondary | ICD-10-CM | POA: Diagnosis not present

## 2017-12-09 DIAGNOSIS — Z810 Family history of intellectual disabilities: Secondary | ICD-10-CM

## 2017-12-09 DIAGNOSIS — F200 Paranoid schizophrenia: Secondary | ICD-10-CM

## 2017-12-09 MED ORDER — PAROXETINE HCL 40 MG PO TABS: 40.0000 mg | ORAL_TABLET | Freq: Every morning | ORAL | 0 refills | Status: DC

## 2017-12-09 NOTE — Progress Notes (Signed)
BH MD/PA/NP OP Progress Note  12/09/2017 2:35 PM GETHSEMANE [name]  MRN:  [id number]  Chief Complaint: I am doing better.  HPI: Chelsea [name] came for her follow-up appointment.  She is taking the medication as prescribed: Paxil 40 mg daily, trazodone 200 mg at bedtime, Klonopin 1 mg at bedtime and Geodon 80 mg at bedtime..  She had been more anxious because she is talking to her friend who is going through depression and sometimes she feel sucked in the situation.  However since last visit she has been able to cut her friend back to calling no more than twice a day.  She is still feeling some stress from it, but she is proud that she was able to assert herself, and feels good that she is able to help her friend.  Chelsea [name] reports that she is able to talk to her sister about it which is helpful, and she feels supported by her sister.  Chelsea [name] states she has not had benefit from therapy or IOP in the past.  She reports good appetite and no problems with sleep taking the medications she is prescribed.  She states she has a sleep doctor, and no longer uses CPAP at night, but wears oxygen at night.  She describes that she only has panic attacks when she feels like she can't breathe from her COPD.  [name] has regular appointments with her primary care physician Dr. Ernie Hew who checks her blood sugar, thyroid and cholesterol (I am unable to view recent labs today).  She states last hemoglobin A1c jumped to 7.7.   She denies any paranoia, AV hallucination, suicidal thoughts.  Her energy level is fair.  She denies any crying spells or any feeling of hopelessness or worthlessness.  She has no tremors shakes or any EPS.  She wants to continue her current medication.   Visit Diagnosis:    ICD-10-CM   1. Paranoid schizophrenia ([place]) F20.0 PARoxetine (PAXIL) 40 MG tablet  2. Other depression F32.89   3. Generalized anxiety disorder F41.1   4. Encounter for medication management Z79.899     Past Psychiatric History:  Reviewed Patient has long history of psychiatric illness. She was admitted at [facility]. There is no history of suicidal attempt however she has history of mania and psychosis. She admitted talking to the picture and herself in the past. She does not remember what medicine she had tried in the past.   Past Medical History:  Past Medical History:  Diagnosis Date  . Anxiety   . Arthritis   . Bipolar disorder ([place])   . COPD (chronic obstructive pulmonary disease) ([place])   . Diabetes mellitus type II   . Esophageal stricture    moderate distal  . GERD (gastroesophageal reflux disease)   . Heart murmur    "HAD AN ECHO YEARS AGO- NOTHING TO BE CONCERNED ABOUT"  . Hyperlipemia   . Hypertension   . Hypothyroidism   . Mental disorder    Bipolar  . Obesity   . Oxygen desaturation during sleep    wears 2 liters at bedtime   . Schizophrenia [name])    patient reports that she is unaware of this  . Shortness of breath    with a little activy  . Sleep apnea    wears 2 liters of oxygen at bedtime instead of CPAP    Past Surgical History:  Procedure Laterality Date  . BALLOON DILATION N/A 06/10/2016   Procedure: BALLOON DILATION;  Surgeon: Evette Doffing  Michail Sermon, MD;  Location: [place];  Service: Endoscopy;  Laterality: N/A;  . BREAST EXCISIONAL BIOPSY Left 05/10/2012   benign  . CHOLECYSTECTOMY    . COLONOSCOPY WITH PROPOFOL N/A 01/30/2017   Procedure: COLONOSCOPY WITH PROPOFOL;  Surgeon: Wilford Corner, MD;  Location: [place];  Service: Endoscopy;  Laterality: N/A;  . ESOPHAGOGASTRODUODENOSCOPY N/A 06/03/2016   Procedure: ESOPHAGOGASTRODUODENOSCOPY (EGD);  Surgeon: Wilford Corner, MD;  Location: [facility] ENDOSCOPY;  Service: Endoscopy;  Laterality: N/A;  . ESOPHAGOGASTRODUODENOSCOPY N/A 06/10/2016   Procedure: ESOPHAGOGASTRODUODENOSCOPY (EGD);  Surgeon: Wilford Corner, MD;  Location: [facility] ENDOSCOPY;  Service: Endoscopy;  Laterality: N/A;  . EYE SURGERY    .  PARTIAL MASTECTOMY WITH NEEDLE LOCALIZATION Left 05/10/2012   Procedure: PARTIAL MASTECTOMY WITH NEEDLE LOCALIZATION;  Surgeon: Adin Hector, MD;  Location: [place];  Service: General;  Laterality: Left;  . TONSILLECTOMY      Family Psychiatric History: Reviewed.  Family History:  Family History  Problem Relation Age of Onset  . Alcohol abuse Father   . Stroke Father   . Alzheimer's disease Mother     Social History:  Zyniah stays with older sister and brother-in-law for many years. She has one cat (Moonlight) and 2 dogs (a lab and a small dog).  Social History   Socioeconomic History  . Marital status: Divorced    Spouse name: Not on file  . Number of children: 0  . Years of education: HS  . Highest education level: Not on file  Occupational History  . Occupation: Retired   Scientific laboratory technician  . Financial resource strain: Not on file  . Food insecurity:    Worry: Not on file    Inability: Not on file  . Transportation needs:    Medical: Not on file    Non-medical: Not on file  Tobacco Use  . Smoking status: Former Smoker    Years: 29.00    Types: Cigarettes    Last attempt to quit: 05/03/1993    Years since quitting: 24.6  . Smokeless tobacco: Never Used  Substance and Sexual Activity  . Alcohol use: No    Alcohol/week: 0.0 standard drinks  . Drug use: No  . Sexual activity: Not Currently  Lifestyle  . Physical activity:    Days per week: Not on file    Minutes per session: Not on file  . Stress: Not on file  Relationships  . Social connections:    Talks on phone: Not on file    Gets together: Not on file    Attends religious service: Not on file    Active member of club or organization: Not on file    Attends meetings of clubs or organizations: Not on file    Relationship status: Not on file  Other Topics Concern  . Not on file  Social History Narrative   Drinks 2 cups of coffee a day    Allergies:  Allergies  Allergen Reactions  . Darvocet  [Propoxyphene N-Acetaminophen] Anaphylaxis    Can take plain tylenol  . Fluoxetine Other (See Comments)    Hallucinations   . Penicillins     REACTION: rash  . Promethazine Hives    Metabolic Disorder Labs: Lab Results  Component Value Date   HGBA1C 6.8 (H) 12/03/2012   MPG 148 (H) 12/03/2012   No results found for: PROLACTIN No results found for: CHOL, TRIG, HDL, CHOLHDL, VLDL, LDLCALC Lab Results  Component Value Date   TSH 1.509 12/03/2012    Therapeutic Level Labs:  No results found for: LITHIUM No results found for: VALPROATE No components found for:  CBMZ  Current Medications: Current Outpatient Medications  Medication Sig Dispense Refill  . aspirin 81 MG chewable tablet Chew 81 mg by mouth daily.    . bumetanide (BUMEX) 1 MG tablet Take 1 tablet (1 mg total) by mouth daily. 30 tablet 0  . Cholecalciferol (VITAMIN D3) 5000 units TABS Take by mouth.    . clonazePAM (KLONOPIN) 1 MG tablet Take 1 tablet (1 mg total) by mouth at bedtime. 30 tablet 2  . Ferrous Sulfate (IRON SUPPLEMENT PO) Take by mouth.    . fluticasone furoate-vilanterol (BREO ELLIPTA) 200-25 MCG/INH AEPB Inhale 1 puff into the lungs every morning.    . Insulin Detemir (LEVEMIR FLEXPEN) 100 UNIT/ML SOPN Inject 100 Units into the skin at bedtime.     Marland Kitchen levothyroxine (SYNTHROID) 150 MCG tablet Take 150 mcg by mouth daily.    . metFORMIN (GLUCOPHAGE) 1000 MG tablet Take 500 mg by mouth 2 (two) times daily with a meal.     . PARoxetine (PAXIL) 40 MG tablet Take 1 tablet (40 mg total) by mouth every morning. 90 tablet 0  . rOPINIRole (REQUIP) 0.25 MG tablet Take 3 tablets (0.75 mg total) by mouth at bedtime. 90 tablet 5  . rosuvastatin (CRESTOR) 20 MG tablet Take 20 mg by mouth daily.    . traZODone (DESYREL) 100 MG tablet Take 2 tablets (200 mg total) by mouth at bedtime. 180 tablet 0  . ziprasidone (GEODON) 80 MG capsule Take 1 capsule (80 mg total) by mouth at bedtime. 90 capsule 0  . INVOKANA 300 MG TABS  tablet Take 300 mg by mouth daily before breakfast.      No current facility-administered medications for this visit.      Musculoskeletal: Strength & Muscle Tone: within normal limits Gait & Station: normal Patient leans: N/A  Psychiatric Specialty Exam: Review of Systems  Constitutional: Positive for weight loss.  Respiratory: Positive for cough and shortness of breath.   Cardiovascular: Positive for chest pain. Negative for palpitations.  Gastrointestinal: Negative.   Musculoskeletal: Negative.   Neurological: Negative.   Endo/Heme/Allergies: Positive for environmental allergies.  Psychiatric/Behavioral: Positive for depression (5/10 at baseline). Negative for hallucinations, substance abuse and suicidal ideas. The patient is nervous/anxious (3/10). The patient does not have insomnia.     Blood pressure 131/72, pulse (!) 103, temperature (!) 97.5 F (36.4 C), height 5\' 2"  (1.575 m), weight 217 lb (98.4 kg).Body mass index is 39.69 kg/m.  General Appearance: Casual  Eye Contact:  Fair  Speech:  Slow  Volume:  Decreased  Mood:  Anxious and Dysphoric  Affect:  Flat  Thought Process:  Coherent and Linear  Orientation:  Full (Time, Place, and Person)  Thought Content: Logical and Hallucinations: None   Suicidal Thoughts:  No  Homicidal Thoughts:  No  Memory:  Immediate;   Fair Recent;   Fair Remote;   Fair  Judgement:  Good  Insight:  Fair  Psychomotor Activity:  Decreased  Concentration:  Concentration: Fair and Attention Span: Fair  Recall:  [employer] of Knowledge: Good  Language: Fair  Akathisia:  No  Handed:  Right  AIMS (if indicated): 0  Assets:  Communication Skills Desire for Improvement Housing Resilience  ADL's:  Intact  Cognition: WNL  Sleep:  Good   Screenings:  N/A  Assessment and Plan: Schizophrenia chronic paranoid type.  Generalized anxiety disorder. Depressed mood  Discussed medication side effects.  Patient does not want to change her  psychiatric medication.  She is not interested in counseling.  Continue Paxil 40 mg daily, trazodone 200 mg at bedtime, Klonopin 1 mg at bedtime and Geodon 80 mg at bedtime.  We will get request recent labs from her primary care physician, Dr. Ernie Hew.  Recommended to call us back if she has any question, concern if she feels worsening of the symptoms.     Time spent 25 minutes.  More than 50% of the time spent in medication education, psychoeducation, counseling and coordination of care.  Discuss safety plan that anytime having active suicidal thoughts or homicidal thoughts then patient need to call 911 or go to the local emergency room.   Follow-up in 3 months   Lavella Hammock, MD 12/09/2017, 2:35 PM

## 2017-12-16 DIAGNOSIS — I129 Hypertensive chronic kidney disease with stage 1 through stage 4 chronic kidney disease, or unspecified chronic kidney disease: Secondary | ICD-10-CM | POA: Diagnosis not present

## 2017-12-16 DIAGNOSIS — N183 Chronic kidney disease, stage 3 (moderate): Secondary | ICD-10-CM | POA: Diagnosis not present

## 2017-12-16 DIAGNOSIS — N390 Urinary tract infection, site not specified: Secondary | ICD-10-CM | POA: Diagnosis not present

## 2017-12-21 DIAGNOSIS — Z79899 Other long term (current) drug therapy: Secondary | ICD-10-CM | POA: Diagnosis not present

## 2017-12-21 DIAGNOSIS — D509 Iron deficiency anemia, unspecified: Secondary | ICD-10-CM | POA: Diagnosis not present

## 2017-12-21 DIAGNOSIS — I10 Essential (primary) hypertension: Secondary | ICD-10-CM | POA: Diagnosis not present

## 2017-12-21 DIAGNOSIS — E782 Mixed hyperlipidemia: Secondary | ICD-10-CM | POA: Diagnosis not present

## 2017-12-25 DIAGNOSIS — Z6839 Body mass index (BMI) 39.0-39.9, adult: Secondary | ICD-10-CM | POA: Diagnosis not present

## 2017-12-25 DIAGNOSIS — E119 Type 2 diabetes mellitus without complications: Secondary | ICD-10-CM | POA: Diagnosis not present

## 2017-12-25 DIAGNOSIS — D509 Iron deficiency anemia, unspecified: Secondary | ICD-10-CM | POA: Diagnosis not present

## 2017-12-25 DIAGNOSIS — E039 Hypothyroidism, unspecified: Secondary | ICD-10-CM | POA: Diagnosis not present

## 2018-01-18 DIAGNOSIS — Z79899 Other long term (current) drug therapy: Secondary | ICD-10-CM | POA: Diagnosis not present

## 2018-01-18 DIAGNOSIS — R0602 Shortness of breath: Secondary | ICD-10-CM | POA: Diagnosis not present

## 2018-01-18 DIAGNOSIS — D509 Iron deficiency anemia, unspecified: Secondary | ICD-10-CM | POA: Diagnosis not present

## 2018-01-18 DIAGNOSIS — N19 Unspecified kidney failure: Secondary | ICD-10-CM | POA: Diagnosis not present

## 2018-01-18 DIAGNOSIS — I10 Essential (primary) hypertension: Secondary | ICD-10-CM | POA: Diagnosis not present

## 2018-01-21 DIAGNOSIS — I10 Essential (primary) hypertension: Secondary | ICD-10-CM | POA: Diagnosis not present

## 2018-01-21 DIAGNOSIS — R0600 Dyspnea, unspecified: Secondary | ICD-10-CM | POA: Diagnosis not present

## 2018-01-21 DIAGNOSIS — Z6841 Body Mass Index (BMI) 40.0 and over, adult: Secondary | ICD-10-CM | POA: Diagnosis not present

## 2018-01-21 DIAGNOSIS — D509 Iron deficiency anemia, unspecified: Secondary | ICD-10-CM | POA: Diagnosis not present

## 2018-01-21 DIAGNOSIS — G4733 Obstructive sleep apnea (adult) (pediatric): Secondary | ICD-10-CM | POA: Diagnosis not present

## 2018-01-27 ENCOUNTER — Other Ambulatory Visit (HOSPITAL_COMMUNITY): Payer: Self-pay | Admitting: Psychiatry

## 2018-01-27 DIAGNOSIS — F200 Paranoid schizophrenia: Secondary | ICD-10-CM

## 2018-02-02 NOTE — Telephone Encounter (Signed)
Given prescription on September 2 with 2 additional refills.  She should not be getting early refills of Klonopin.

## 2018-02-15 DIAGNOSIS — E119 Type 2 diabetes mellitus without complications: Secondary | ICD-10-CM | POA: Diagnosis not present

## 2018-02-15 DIAGNOSIS — D509 Iron deficiency anemia, unspecified: Secondary | ICD-10-CM | POA: Diagnosis not present

## 2018-02-17 DIAGNOSIS — D509 Iron deficiency anemia, unspecified: Secondary | ICD-10-CM | POA: Diagnosis not present

## 2018-02-17 DIAGNOSIS — J449 Chronic obstructive pulmonary disease, unspecified: Secondary | ICD-10-CM | POA: Diagnosis not present

## 2018-02-17 DIAGNOSIS — E782 Mixed hyperlipidemia: Secondary | ICD-10-CM | POA: Diagnosis not present

## 2018-02-17 DIAGNOSIS — E119 Type 2 diabetes mellitus without complications: Secondary | ICD-10-CM | POA: Diagnosis not present

## 2018-02-17 DIAGNOSIS — I10 Essential (primary) hypertension: Secondary | ICD-10-CM | POA: Diagnosis not present

## 2018-02-17 DIAGNOSIS — E559 Vitamin D deficiency, unspecified: Secondary | ICD-10-CM | POA: Diagnosis not present

## 2018-02-17 DIAGNOSIS — E039 Hypothyroidism, unspecified: Secondary | ICD-10-CM | POA: Diagnosis not present

## 2018-02-17 DIAGNOSIS — I509 Heart failure, unspecified: Secondary | ICD-10-CM | POA: Diagnosis not present

## 2018-02-17 DIAGNOSIS — E662 Morbid (severe) obesity with alveolar hypoventilation: Secondary | ICD-10-CM | POA: Diagnosis not present

## 2018-02-24 ENCOUNTER — Other Ambulatory Visit (HOSPITAL_COMMUNITY): Payer: Self-pay | Admitting: Psychiatry

## 2018-02-24 DIAGNOSIS — F200 Paranoid schizophrenia: Secondary | ICD-10-CM

## 2018-03-04 DIAGNOSIS — R5381 Other malaise: Secondary | ICD-10-CM | POA: Diagnosis not present

## 2018-03-04 DIAGNOSIS — R5383 Other fatigue: Secondary | ICD-10-CM | POA: Diagnosis not present

## 2018-03-04 DIAGNOSIS — E662 Morbid (severe) obesity with alveolar hypoventilation: Secondary | ICD-10-CM | POA: Diagnosis not present

## 2018-03-04 DIAGNOSIS — R0602 Shortness of breath: Secondary | ICD-10-CM | POA: Diagnosis not present

## 2018-03-11 ENCOUNTER — Encounter (HOSPITAL_COMMUNITY): Payer: Self-pay | Admitting: Psychiatry

## 2018-03-11 ENCOUNTER — Ambulatory Visit (INDEPENDENT_AMBULATORY_CARE_PROVIDER_SITE_OTHER): Payer: Medicare Other | Admitting: Psychiatry

## 2018-03-11 VITALS — BP 128/60 | HR 74 | Ht 62.0 in | Wt 224.0 lb

## 2018-03-11 DIAGNOSIS — F411 Generalized anxiety disorder: Secondary | ICD-10-CM | POA: Diagnosis not present

## 2018-03-11 DIAGNOSIS — F200 Paranoid schizophrenia: Secondary | ICD-10-CM | POA: Diagnosis not present

## 2018-03-11 MED ORDER — TRAZODONE HCL 100 MG PO TABS: 200.0000 mg | ORAL_TABLET | Freq: Every day | ORAL | 0 refills | Status: DC

## 2018-03-11 MED ORDER — CLONAZEPAM 1 MG PO TABS: 1.0000 mg | ORAL_TABLET | Freq: Every day | ORAL | 0 refills | Status: DC

## 2018-03-11 MED ORDER — ZIPRASIDONE HCL 80 MG PO CAPS: 80.0000 mg | ORAL_CAPSULE | Freq: Every day | ORAL | 0 refills | Status: DC

## 2018-03-11 MED ORDER — PAROXETINE HCL 40 MG PO TABS: 40.0000 mg | ORAL_TABLET | Freq: Every morning | ORAL | 0 refills | Status: DC

## 2018-03-11 NOTE — Progress Notes (Signed)
BH MD/PA/NP OP Progress Note  03/11/2018 11:34 AM Chelsea [name]  MRN:  [id number]  Chief Complaint: I am doing good.  I gained few pounds after Thanksgiving.  HPI: Chelsea [name] came for her follow-up appointment.  She is compliant with her psychiatric medication.  She denies any recent paranoia, hallucination or any agitation.  She regret gaining weight after Thanksgiving because she admitted not able to control her diet.  Overall she describes her mood is stable.  She is sleeping good.  We received blood work results from nephrology.  Her last creatinine is 1.08 which is improved from the past.  Her energy level is fair.  She had a good support from her sister who lives close by.  She is not interested in therapy or counseling.  She denies any major panic attack.  She does not ask early refills for her benzodiazepine.  She was seen last by Dr. Laurance Flatten and her medicines were not changed.  Patient has mild tremors that does not interfere in her daily routine.  Visit Diagnosis:    ICD-10-CM   1. Generalized anxiety disorder F41.1 clonazePAM (KLONOPIN) 1 MG tablet  2. Paranoid schizophrenia ([place]) F20.0 ziprasidone (GEODON) 80 MG capsule    traZODone (DESYREL) 100 MG tablet    PARoxetine (PAXIL) 40 MG tablet    clonazePAM (KLONOPIN) 1 MG tablet    Past Psychiatric History: Reviewed.  History of admision at [facility] and [facility] for mania and psychosis. No history of suicidal attempt.    Past Medical History:  Past Medical History:  Diagnosis Date  . Anxiety   . Arthritis   . Bipolar disorder ([place])   . COPD (chronic obstructive pulmonary disease) ([place])   . Diabetes mellitus type II   . Esophageal stricture    moderate distal  . GERD (gastroesophageal reflux disease)   . Heart murmur    "HAD AN ECHO YEARS AGO- NOTHING TO BE CONCERNED ABOUT"  . Hyperlipemia   . Hypertension   . Hypothyroidism   . Mental disorder    Bipolar  . Obesity   . Oxygen desaturation during sleep    wears  2 liters at bedtime   . Schizophrenia [facility])    patient reports that she is unaware of this  . Shortness of breath    with a little activy  . Sleep apnea    wears 2 liters of oxygen at bedtime instead of CPAP    Past Surgical History:  Procedure Laterality Date  . BALLOON DILATION N/A 06/10/2016   Procedure: BALLOON DILATION;  Surgeon: Wilford Corner, MD;  Location: [facility] ENDOSCOPY;  Service: Endoscopy;  Laterality: N/A;  . BREAST EXCISIONAL BIOPSY Left 05/10/2012   benign  . CHOLECYSTECTOMY    . COLONOSCOPY WITH PROPOFOL N/A 01/30/2017   Procedure: COLONOSCOPY WITH PROPOFOL;  Surgeon: Wilford Corner, MD;  Location: [place];  Service: Endoscopy;  Laterality: N/A;  . ESOPHAGOGASTRODUODENOSCOPY N/A 06/03/2016   Procedure: ESOPHAGOGASTRODUODENOSCOPY (EGD);  Surgeon: Wilford Corner, MD;  Location[facility] ENDOSCOPY;  Service: Endoscopy;  Laterality: N/A;  . ESOPHAGOGASTRODUODENOSCOPY N/A 06/10/2016   Procedure: ESOPHAGOGASTRODUODENOSCOPY (EGD);  Surgeon: Wilford Corner, MD;  Location: [facility] ENDOSCOPY;  Service: Endoscopy;  Laterality: N/A;  . EYE SURGERY    . PARTIAL MASTECTOMY WITH NEEDLE LOCALIZATION Left 05/10/2012   Procedure: PARTIAL MASTECTOMY WITH NEEDLE LOCALIZATION;  Surgeon: Adin Hector, MD;  Location: [place];  Service: General;  Laterality: Left;  . TONSILLECTOMY      Family Psychiatric History: Reviewed.  Family History:  Family History  Problem Relation Age of Onset  . Alcohol abuse Father   . Stroke Father   . Alzheimer's disease Mother     Social History:  Social History   Socioeconomic History  . Marital status: Divorced    Spouse name: Not on file  . Number of children: 0  . Years of education: HS  . Highest education level: Not on file  Occupational History  . Occupation: Retired   Scientific laboratory technician  . Financial resource strain: Not on file  . Food insecurity:    Worry: Not on file    Inability: Not on file  . Transportation needs:    Medical: Not on  file    Non-medical: Not on file  Tobacco Use  . Smoking status: Former Smoker    Years: 29.00    Types: Cigarettes    Last attempt to quit: 05/03/1993    Years since quitting: 24.8  . Smokeless tobacco: Never Used  Substance and Sexual Activity  . Alcohol use: No    Alcohol/week: 0.0 standard drinks  . Drug use: No  . Sexual activity: Not Currently  Lifestyle  . Physical activity:    Days per week: Not on file    Minutes per session: Not on file  . Stress: Not on file  Relationships  . Social connections:    Talks on phone: Not on file    Gets together: Not on file    Attends religious service: Not on file    Active member of club or organization: Not on file    Attends meetings of clubs or organizations: Not on file    Relationship status: Not on file  Other Topics Concern  . Not on file  Social History Narrative   Drinks 2 cups of coffee a day    Allergies:  Allergies  Allergen Reactions  . Darvocet [Propoxyphene N-Acetaminophen] Anaphylaxis    Can take plain tylenol  . Fluoxetine Other (See Comments)    Hallucinations   . Penicillins     REACTION: rash  . Promethazine Hives    Metabolic Disorder Labs: Lab Results  Component Value Date   HGBA1C 6.8 (H) 12/03/2012   MPG 148 (H) 12/03/2012   No results found for: PROLACTIN No results found for: CHOL, TRIG, HDL, CHOLHDL, VLDL, LDLCALC Lab Results  Component Value Date   TSH 1.509 12/03/2012    Therapeutic Level Labs: No results found for: LITHIUM No results found for: VALPROATE No components found for:  CBMZ  Current Medications: Current Outpatient Medications  Medication Sig Dispense Refill  . aspirin 81 MG chewable tablet Chew 81 mg by mouth daily.    . bumetanide (BUMEX) 1 MG tablet Take 1 tablet (1 mg total) by mouth daily. 30 tablet 0  . Cholecalciferol (VITAMIN D3) 5000 units TABS Take by mouth.    . clonazePAM (KLONOPIN) 1 MG tablet TAKE 1 TABLET BY MOUTH AT BEDTIME 30 tablet 0  . Ferrous  Sulfate (IRON SUPPLEMENT PO) Take by mouth.    . fluticasone furoate-vilanterol (BREO ELLIPTA) 200-25 MCG/INH AEPB Inhale 1 puff into the lungs every morning.    . Insulin Detemir (LEVEMIR FLEXPEN) 100 UNIT/ML SOPN Inject 100 Units into the skin at bedtime.     . INVOKANA 300 MG TABS tablet Take 300 mg by mouth daily before breakfast.     . levothyroxine (SYNTHROID) 150 MCG tablet Take 150 mcg by mouth daily.    . metFORMIN (GLUCOPHAGE) 1000 MG tablet Take 500 mg  by mouth 2 (two) times daily with a meal.     . PARoxetine (PAXIL) 40 MG tablet Take 1 tablet (40 mg total) by mouth every morning. 90 tablet 0  . rOPINIRole (REQUIP) 0.25 MG tablet Take 3 tablets (0.75 mg total) by mouth at bedtime. 90 tablet 5  . rosuvastatin (CRESTOR) 20 MG tablet Take 20 mg by mouth daily.    . traZODone (DESYREL) 100 MG tablet Take 2 tablets (200 mg total) by mouth at bedtime. 180 tablet 0  . ziprasidone (GEODON) 80 MG capsule Take 1 capsule (80 mg total) by mouth at bedtime. 90 capsule 0   No current facility-administered medications for this visit.      Musculoskeletal: Strength & Muscle Tone: within normal limits Gait & Station: normal Patient leans: N/A  Psychiatric Specialty Exam: ROS  Blood pressure 128/60, pulse 74, height 5\' 2"  (1.575 m), weight 224 lb (101.6 kg).Body mass index is 40.97 kg/m.  General Appearance: Casual  Eye Contact:  Fair  Speech:  Slow  Volume:  Decreased  Mood:  Euthymic  Affect:  Congruent  Thought Process:  Descriptions of Associations: Intact  Orientation:  Full (Time, Place, and Person)  Thought Content: Logical   Suicidal Thoughts:  No  Homicidal Thoughts:  No  Memory:  Immediate;   Fair Recent;   Fair Remote;   Fair  Judgement:  Good  Insight:  Good  Psychomotor Activity:  Decreased and Tremor  Concentration:  Concentration: Fair and Attention Span: Fair  Recall:  [employer] of Knowledge: Good  Language: Good  Akathisia:  No  Handed:  Right  AIMS (if  indicated): not done  Assets:  Communication Skills Desire for Improvement Housing Resilience Social Support  ADL's:  Intact  Cognition: WNL  Sleep:  Good   Screenings:   Assessment and Plan: Schizophrenia chronic paranoid type.  Generalized anxiety disorder.  Patient is a stable on her current medication.  She has mild tremors but she does not want to take any medication as it does not interfere in her daily routine.  Discussed healthy lifestyle and watch her calorie intake.  She regret getting 4 pound since the last visit.  She does not want to change her medication.  Continue Geodon 80 mg at bedtime, Klonopin 1 mg at bedtime, trazodone 200 mg at bedtime and Paxil 40 mg daily.  She is seeing regularly her physician Dr. Ernie Hew for primary care needs.  Her last lithium level was 1.09.  Recommended to call us back if is any question or any concern.  She is not interested in therapy.  Follow-up in 3 months.   Kathlee Nations, MD 03/11/2018, 11:35 AM

## 2018-03-23 ENCOUNTER — Other Ambulatory Visit: Payer: Self-pay | Admitting: Adult Health

## 2018-04-01 DIAGNOSIS — R5383 Other fatigue: Secondary | ICD-10-CM | POA: Diagnosis not present

## 2018-04-01 DIAGNOSIS — E662 Morbid (severe) obesity with alveolar hypoventilation: Secondary | ICD-10-CM | POA: Diagnosis not present

## 2018-04-01 DIAGNOSIS — R5381 Other malaise: Secondary | ICD-10-CM | POA: Diagnosis not present

## 2018-04-01 DIAGNOSIS — R0602 Shortness of breath: Secondary | ICD-10-CM | POA: Diagnosis not present

## 2018-04-27 ENCOUNTER — Encounter: Payer: Self-pay | Admitting: Neurology

## 2018-04-27 ENCOUNTER — Ambulatory Visit (INDEPENDENT_AMBULATORY_CARE_PROVIDER_SITE_OTHER): Payer: Medicare Other | Admitting: Neurology

## 2018-04-27 VITALS — BP 142/82 | HR 71 | Ht 62.0 in | Wt 227.0 lb

## 2018-04-27 DIAGNOSIS — G2581 Restless legs syndrome: Secondary | ICD-10-CM | POA: Diagnosis not present

## 2018-04-27 DIAGNOSIS — R0683 Snoring: Secondary | ICD-10-CM

## 2018-04-27 DIAGNOSIS — R351 Nocturia: Secondary | ICD-10-CM

## 2018-04-27 DIAGNOSIS — Z9981 Dependence on supplemental oxygen: Secondary | ICD-10-CM | POA: Diagnosis not present

## 2018-04-27 DIAGNOSIS — Z6841 Body Mass Index (BMI) 40.0 and over, adult: Secondary | ICD-10-CM

## 2018-04-27 DIAGNOSIS — G4719 Other hypersomnia: Secondary | ICD-10-CM | POA: Diagnosis not present

## 2018-04-27 DIAGNOSIS — G4734 Idiopathic sleep related nonobstructive alveolar hypoventilation: Secondary | ICD-10-CM

## 2018-04-27 NOTE — Patient Instructions (Signed)
Here is what we discussed today and what we came up with as our plan for you:    Based on your symptoms and your exam you could be at risk for obstructive sleep apnea (aka OSA), and I think we should proceed with a sleep study to determine whether you do or do not have OSA and how severe it is.   Even, if you have mild OSA, I may want you to consider treatment with CPAP, as treatment of even borderline or mild sleep apnea can result and improvement of symptoms such as sleep disruption, daytime sleepiness, nighttime bathroom breaks, restless leg symptoms, improvement of headache syndromes, even improved mood disorder.   Please remember, the long-term risks and ramifications of untreated moderate to severe obstructive sleep apnea are: increased Cardiovascular disease, including congestive heart failure, stroke, difficult to control hypertension, treatment resistant obesity, arrhythmias, especially irregular heartbeat commonly known as A. Fib. (atrial fibrillation); even type 2 diabetes has been linked to untreated OSA.   Sleep apnea can cause disruption of sleep and sleep deprivation in most cases, which, in turn, can cause recurrent headaches, problems with memory, mood, concentration, focus, and vigilance. Most people with untreated sleep apnea report excessive daytime sleepiness, which can affect their ability to drive. Please do not drive if you feel sleepy. Patients with sleep apnea developed difficulty initiating and maintaining sleep (aka insomnia).   Having sleep apnea may increase your risk for other sleep disorders, including involuntary behaviors sleep such as sleep terrors, sleep talking, sleepwalking.    Having sleep apnea can also increase your risk for restless leg syndrome and leg movements at night.   Please note that untreated obstructive sleep apnea may carry additional perioperative morbidity. Patients with significant obstructive sleep apnea (typically, in the moderate to severe  degree) should receive, if possible, perioperative PAP (positive airway pressure) therapy and the surgeons and particularly the anesthesiologists should be informed of the diagnosis and the severity of the sleep disordered breathing.   I will likely see you back after your sleep study to go over the test results and where to go from there. We will call you after your sleep study to advise about the results (most likely, you will hear from [place], my nurse) and to set up an appointment at the time, as necessary.    Our sleep lab administrative assistant will call you to schedule your sleep study and give you further instructions, regarding the check in process for the sleep study, arrival time, what to bring, when you can expect to leave after the study, etc., and to answer any other logistical questions you may have. If you don't hear back from her by about 2 weeks from now, please feel free to call her direct line at [phone] or you can call our general clinic number, or email [place] through My Chart.

## 2018-04-27 NOTE — Progress Notes (Signed)
Subjective:    Patient ID: Chelsea [name] is a 68 y.o. female.  HPI     Interim history:   Chelsea [name] is a 67 year old right-handed woman with a complex medical history of hypothyroidism, hypertension, type 2 diabetes, hyperlipidemia, morbid obesity, mood disorder, (incl. schizophrenia for which she is followed by mental health), Vitamin D deficiency, allergic rhinitis, osteoarthritis, who presents for follow-up consultation of her sleep disturbance, in particular, reevaluation of her prior diagnosis of obstructive sleep apnea. The patient is accompanied by her brother in law today. I last saw her on 07/22/2016, at which time he was doing well on Requip low-dose. She saw Ward Givens, NP, in the interim 11/05/2017, at which time she was restarted on Requip.   Today, 04/27/2018: She reports having a hard time sleeping. She has a late BT, around 1 AM, but for some reason she takes her bedtime meds already at 7 PM. On high dose Trazodone and Geodon and Klonopin at night. She dozes off and of and does not always use her O2 during naps or at night, as she sleeps in the recliner and the O2 concentrator is in the BR. Rise time around 9 AM or sooner or later. Nocturia about 1-2/night. Some SOB reported, does not move that much d/t knee pains. Hx is primarily provided by her brother in law. She has been referred for reevaluation of OSA by her primary care physician. I reviewed Dr. Ival Bible note from 01/21/2018. Of note, her sleep study from 08/12/2015 showed no significant sleep disordered breathing with an AHI of 3.1, she did have lower oxygen saturations with a nadir of 77%. The patient was advised to continue with her nighttime oxygen supplementation. Take ropinirole 0.75 mg each night. RLS under control. Compared to her last sleep study, wt is about 33 lb less. Her Epworth sleepiness score is 7 out of 24, fatigue score is 57 out of 63. She lives with her sister and her brother-in-law.   The patient's  allergies, current medications, family history, past medical history, past social history, past surgical history and problem list were reviewed and updated as appropriate.    Previously (copied from previous notes for reference):    I saw her on 09/12/2015 after her recent sleep study but talked about her results at the time. She reported no new issues. She had some intermittent restless legs symptoms, but we mutually agreed to monitor. She was on supplemental oxygen at night which I asked her to continue as she did not have any overt obstructive sleep apnea type changes during sleep study but lower trending oxygen saturations. Supplemental oxygen was not utilized for the study. She was trying to lose weight and lost a few pounds. I suggested a six-month recheck.   I first met her on 07/23/2015 at the request of her primary care physician, at which time patient reported a prior diagnosis of obstructive sleep apnea, on 8-10 years ago but she was no longer on CPAP therapy and was using oxygen at night. I invited her back for sleep study. She had a diagnostic sleep study on 08/12/2015. I went over her test results with her in detail today. Sleep efficiency was reduced at 82.7% with a latency to sleep of 28 minutes and wake after sleep onset of 57 minutes with mild to moderate sleep fragmentation noted. She had an elevated arousal index. She had an increased percentage of light stage sleep, absence of slow-wave sleep and a decreased percentage of REM sleep with a prolonged  REM latency. She had moderate PLMS with an index of 34 per hour, resulting in 8 arousals per hour. She hadn't moderate snoring. AHI was 3.1 per hour in total, rising to 20 per hour during REM sleep. Average oxygen desaturation was 87%, nadir was 77%. Time below 90% saturation was 6 hours and 42 minutes, time below 88% saturation was 6 hours and 22 minutes. Study was conducted without supplemental oxygen, she uses oxygen at home. She was advised  to continue with her home oxygen therapy.   07/23/2015: She was previously diagnosed with obstructive sleep apnea several years ago, maybe 8-10 years ago, but is no longer using CPAP. She uses oxygen at night. Prior sleep study results are not available for my review today. I reviewed your office note from 07/05/2015, which you kindly included. She had blood work on 07/05/15 , which per sister, were okay. She also has had symptoms of restless legs and sometimes has to walk around.   She has nocturia, about 2-3 times a night. She has occasional morning headaches, about 3 times a week, does not usually take medication.   She does not watch TV in bed.   She goes to bed around 8 PM and takes several medications at night. Rise time is around 6:30 AM. She has a cat, that sleeps in her bed.   She is divorced, no children.   She had an upper respiratory infection about 4 months ago and was placed on oxygen at the time. She currently uses this at night, 2 L/m. She has an appointment with Dr. Halford Chessman in pulmonology pending for June of this year. She has quit smoking many years ago, 1995. She quit drinking alcohol in 1990 and had a history of excessive alcohol use. Her father was an alcoholic. She has 1 sister, [name], and patient lives with her sister and sister's husband. She has no biological children. She is retired from housekeeping at the hospital. She has a high school education. She is divorced. She drinks caffeine in the form of coffee, 2 cups per day and occasional diet sodas. She moves a lot in her sleep. She would be willing to try CPAP again. Her main issue with CPAP was uncomfortable mask and pressure. She had a CPAP titration study on 07/01/2009 which I was able to review through your records: She had a titration up to 15 cm. Her AHI was reduced according to the report, sleep efficiency 97%, REM latency 209 minutes. She had no slow-wave sleep. A baseline sleep study report was not available for my review and  was also not mentioned in results in the CPAP titration study which was interpreted by Dr. Brandon Melnick.   She had a tonsillectomy as a child. Her Epworth sleepiness score is 6 out of 24 today, her fatigue score is 60 out of 63. She sleeps with the head of bed elevated at 45 for perforans. She feels she can breathe better at night like that. She has a hospital bed.  Her Past Medical History Is Significant For: Past Medical History:  Diagnosis Date  . Anxiety   . Arthritis   . Bipolar disorder ([place])   . COPD (chronic obstructive pulmonary disease) ([place])   . Diabetes mellitus type II   . Esophageal stricture    moderate distal  . GERD (gastroesophageal reflux disease)   . Heart murmur    "HAD AN ECHO YEARS AGO- NOTHING TO BE CONCERNED ABOUT"  . Hyperlipemia   . Hypertension   . Hypothyroidism   .  Mental disorder    Bipolar  . Obesity   . Oxygen desaturation during sleep    wears 2 liters at bedtime   . Schizophrenia [facility])    patient reports that she is unaware of this  . Shortness of breath    with a little activy  . Sleep apnea    wears 2 liters of oxygen at bedtime instead of CPAP    Her Past Surgical History Is Significant For: Past Surgical History:  Procedure Laterality Date  . BALLOON DILATION N/A 06/10/2016   Procedure: BALLOON DILATION;  Surgeon: Wilford Corner, MD;  Location: [facility] ENDOSCOPY;  Service: Endoscopy;  Laterality: N/A;  . BREAST EXCISIONAL BIOPSY Left 05/10/2012   benign  . CHOLECYSTECTOMY    . COLONOSCOPY WITH PROPOFOL N/A 01/30/2017   Procedure: COLONOSCOPY WITH PROPOFOL;  Surgeon: Wilford Corner, MD;  Location: [facility];  Service: Endoscopy;  Laterality: N/A;  . ESOPHAGOGASTRODUODENOSCOPY N/A 06/03/2016   Procedure: ESOPHAGOGASTRODUODENOSCOPY (EGD);  Surgeon: Wilford Corner, MD;  Location: [facility] ENDOSCOPY;  Service: Endoscopy;  Laterality: N/A;  . ESOPHAGOGASTRODUODENOSCOPY N/A 06/10/2016   Procedure: ESOPHAGOGASTRODUODENOSCOPY (EGD);  Surgeon: Wilford Corner, MD;  Location: [facility] ENDOSCOPY;  Service: Endoscopy;  Laterality: N/A;  . EYE SURGERY    . PARTIAL MASTECTOMY WITH NEEDLE LOCALIZATION Left 05/10/2012   Procedure: PARTIAL MASTECTOMY WITH NEEDLE LOCALIZATION;  Surgeon: Adin Hector, MD;  Location: [facility];  Service: General;  Laterality: Left;  . TONSILLECTOMY      Her Family History Is Significant For: Family History  Problem Relation Age of Onset  . Alcohol abuse Father   . Stroke Father   . Alzheimer's disease Mother     Her Social History Is Significant For: Social History   Socioeconomic History  . Marital status: Divorced    Spouse name: Not on file  . Number of children: 0  . Years of education: HS  . Highest education level: Not on file  Occupational History  . Occupation: Retired   Scientific laboratory technician  . Financial resource strain: Not on file  . Food insecurity:    Worry: Not on file    Inability: Not on file  . Transportation needs:    Medical: Not on file    Non-medical: Not on file  Tobacco Use  . Smoking status: Former Smoker    Years: 29.00    Types: Cigarettes    Last attempt to quit: 05/03/1993    Years since quitting: 25.0  . Smokeless tobacco: Never Used  Substance and Sexual Activity  . Alcohol use: No    Alcohol/week: 0.0 standard drinks  . Drug use: No  . Sexual activity: Not Currently  Lifestyle  . Physical activity:    Days per week: Not on file    Minutes per session: Not on file  . Stress: Not on file  Relationships  . Social connections:    Talks on phone: Not on file    Gets together: Not on file    Attends religious service: Not on file    Active member of club or organization: Not on file    Attends meetings of clubs or organizations: Not on file    Relationship status: Not on file  Other Topics Concern  . Not on file  Social History Narrative   Drinks 2 cups of coffee a day    Her Allergies Are:  Allergies  Allergen Reactions  . Darvocet [Propoxyphene N-Acetaminophen]  Anaphylaxis    Can take plain tylenol  . Fluoxetine Other (  See Comments)    Hallucinations   . Penicillins Rash    REACTION: rash  . Promethazine Hives  :   Her Current Medications Are:  Outpatient Encounter Medications as of 04/27/2018  Medication Sig  . aspirin 81 MG chewable tablet Chew 81 mg by mouth daily.  . bumetanide (BUMEX) 1 MG tablet Take 1 tablet (1 mg total) by mouth daily.  . Cholecalciferol (VITAMIN D3) 5000 units TABS Take by mouth.  . clonazePAM (KLONOPIN) 1 MG tablet Take 1 tablet (1 mg total) by mouth at bedtime.  . Ferrous Sulfate (IRON SUPPLEMENT PO) Take by mouth.  . fluticasone furoate-vilanterol (BREO ELLIPTA) 200-25 MCG/INH AEPB Inhale 1 puff into the lungs every morning.  . Insulin Detemir (LEVEMIR FLEXPEN) 100 UNIT/ML SOPN Inject 100 Units into the skin at bedtime.   . INVOKANA 300 MG TABS tablet Take 300 mg by mouth daily before breakfast.   . levothyroxine (SYNTHROID) 150 MCG tablet Take 150 mcg by mouth daily.  . metFORMIN (GLUCOPHAGE) 1000 MG tablet Take 500 mg by mouth 2 (two) times daily with a meal.   . OZEMPIC, 1 MG/DOSE, 2 MG/1.5ML SOPN INJECT 1 MG SUBCUTANEOUSLY ONCE A WEEK  . PARoxetine (PAXIL) 40 MG tablet Take 1 tablet (40 mg total) by mouth every morning.  Marland Kitchen rOPINIRole (REQUIP) 0.25 MG tablet TAKE 3 TABLETS BY MOUTH AT BEDTIME  . rosuvastatin (CRESTOR) 20 MG tablet Take 20 mg by mouth daily.  . traZODone (DESYREL) 100 MG tablet Take 2 tablets (200 mg total) by mouth at bedtime.  . ziprasidone (GEODON) 80 MG capsule Take 1 capsule (80 mg total) by mouth at bedtime.   No facility-administered encounter medications on file as of 04/27/2018.   :  Review of Systems:  Out of a complete 14 point review of systems, all are reviewed and negative with the exception of these symptoms as listed below:  Review of Systems  Neurological:       Pt presents today to discuss if she needs to be on cpap. She uses nocturnal O2. Pt reports that her RLS is well  controlled with requip. Pt's family is worried about her napping during the day without using her O2.  Epworth Sleepiness Scale 0= would never doze 1= slight chance of dozing 2= moderate chance of dozing 3= high chance of dozing  Sitting and reading: 0 Watching TV: 3 Sitting inactive in a public place (ex. Theater or meeting): 0 As a passenger in a car for an hour without a break: 1 Lying down to rest in the afternoon: 2 Sitting and talking to someone: 0 Sitting quietly after lunch (no alcohol): 1 In a car, while stopped in traffic: 0 Total: 7     Objective:  Neurological Exam  Physical Exam Physical Examination:   Vitals:   04/27/18 0817  BP: (!) 142/82  Pulse: 71    General Examination: The patient is a very pleasant 68 y.o. female in no acute distress. She appears well-developed and well-nourished and well groomed.   HEENT:Normocephalic, atraumatic, pupils are equal, round and reactive to light and accommodation. Extraocular tracking is somewhat difficult for her. Face is symmetric. She has no hypophonia, no significant speech issues, mouth is moderately dry, affecting his speech. She has no lip, neck or jaw tremor. Oropharynx exam reveals: moderate mouth dryness, adequate dental hygiene and moderate airway crowding, due to smaller airway entry and redundant soft palate. Mallampati is class II. Tongue protrudes centrally and palate elevates symmetrically. Tonsils are absent. Neck  15.5 in.  Chest:Clear to auscultation without wheezing, rhonchi or crackles noted.  Heart:S1+S2+0, regular and normal without murmurs, rubs or gallops noted.   Abdomen:Soft, non-tender and non-distended with normal bowel sounds appreciated on auscultation.  Extremities:There is no pitting edema in the distal lower extremities bilaterally.   Skin: Warm and dry without trophic changes noted. There are no varicose veins.  Musculoskeletal: exam reveals no obvious joint deformities,  tenderness or joint swelling or erythema, with the exception of bilateral mild knee discomfort.   Neurologically:   Mental status: The patient is awake, alert and oriented in all 4 spheres. Her immediate and remote memory, attention, language skills and fund of knowledge are fairly appropriate, but she gives a limited Hx only. Mood is constricted and affect is blunted. There is mild psychomotor retardation, stable. Cranial nerves II - XII are as described above under HEENT exam.  Motor exam: Normal bulk, strength and tone is noted. There is no tremor, but mild motor restlessness. Fine motor skills and coordination: intact, slightly slow.  Cerebellar testing: No dysmetria or intention tremor. There is no truncal or gait ataxia.  Sensory exam: intact to light touch in the UEs and LEs.  Gait, station and balance: She stands with mild difficulty. No veering to one side is noted. No leaning to one side is noted. Posture is age-appropriate and stance is narrow based. Gait shows slow and cautious gait, tandem walk is not possible for her.    Assessment and Plan:  In summary, Chelsea [name] is a very pleasant 68 year old female with an underlying complex medical history of hypothyroidism, hypertension, type 2 diabetes, hyperlipidemia, morbid obesity, mood disorder, including paranoid schizophrenia for which she is followed by mental health, vitamin D deficiency, allergic rhinitis, osteoarthritis, who presents for reevaluation for sleep apnea. She had a sleep study in May 2017 which was negative for OSA but did show lower oxygen saturations at night, the study was done without supplemental oxygen, average oxygen saturation was 87%, nadir was 77%. She has been on nocturnal oxygen supplementation but does not use it all night because she sleeps some part in her recliner, not in her bedroom. She has been sleepy during the day and in the early evenings. She is advised to take her bedtime medication at bedtime. She  does not go to bed until 1 AM takes the trazodone which is currently fairly high dose and her Geodon and clonazepam around 7 PM for some reason. She is advised to discuss this with her psychiatrist as well as far as timing of these medicines go. She is encouraged to move up her bedtime and try to consistently sleep in her bed with her oxygen supplementation. We will revisit potential obstructive sleep disordered breathing with a sleep study. We will start the study without supplemental oxygen so not to mask any hypopneas. Of note, she has restless legs symptoms and history of PLMS, both are well controlled on ropinirole 0.75 mg at night. She was started on ropinirole with gradual titration in December 2017 and indicated good results then as well. She has lost over 30 pounds since her last sleep study. I will see her back after the sleep study; she did not need any refills on the ropinirole. I answered all their questions today and the patient and her brother-in-law were in agreement. I spent 25 minutes in total face-to-face time with the patient, more than 50% of which was spent in counseling and coordination of care, reviewing test results, reviewing medication  and discussing or reviewing the diagnosis of PLMD, RLS, noct hypoxemia, the prognosis and treatment options. Pertinent laboratory and imaging test results that were available during this visit with the patient were reviewed by me and considered in my medical decision making (see chart for details).

## 2018-05-02 ENCOUNTER — Other Ambulatory Visit: Payer: Self-pay | Admitting: Cardiology

## 2018-05-02 DIAGNOSIS — R0609 Other forms of dyspnea: Principal | ICD-10-CM

## 2018-05-14 ENCOUNTER — Ambulatory Visit: Payer: Self-pay | Admitting: Cardiology

## 2018-05-14 NOTE — Progress Notes (Deleted)
Subjective:   Chelsea [name], female    DOB: [birth year], 68 y.o.   MRN: [id number]  Fanny Bien, MD:  No chief complaint on file.   HPI: Chelsea [name]  is a 68 y.o. female  with a medical history significant for type 2 diabetes, morbid obesity, hypertension, and hyperlipidemia who presents for a follow-up for shortness of breath. Last seen in the office on 04/01/2018.  HPI     Patient with type last seen by [place] in 2017 referred back to [place] for evaluation of progressively worsening dyspnea and fatigue over the last 6 months.  She previously underwent stress testing in 2014 that was normal. Echocardiogram also performed at that time revealed diastolic dysfunction, otherwise normal echocardiogram.  She was last seen by me 4 weeks ago at the request of her PCP, Dr. doing, for progressively worsening shortness of breath and fatigue. Fatigue improved with treatment of anemia, but she continued to have shortness of breath. I have encouraged her to make diet changes to promote weight loss and also start regular exercise as I felt her shortness of breath is likely related to her obesity. She has been making diet changes and walking daily and has lost 5 pounds since last seen by me. Unfortunately, has not had improvement in shortness of breath. She denies any chest pain. No leg edema. She does have to sleep elevated due to dyspnea, but this is not new for her.  She has been referred back to sleep medicine for reevaluation of sleep apnea. Has history of this; however, does not use CPAP, only uses oxygen therapy at night.  She lives with her sister and brother-in-law.  Past Medical History:  Diagnosis Date  . Anxiety   . Arthritis   . Bipolar disorder ([place])   . COPD (chronic obstructive pulmonary disease) ([place])   . Diabetes mellitus type II   . Esophageal stricture    moderate distal  . GERD (gastroesophageal reflux disease)   . Heart murmur    "HAD AN ECHO YEARS AGO-  NOTHING TO BE CONCERNED ABOUT"  . Hyperlipemia   . Hypertension   . Hypothyroidism   . Mental disorder    Bipolar  . Obesity   . Oxygen desaturation during sleep    wears 2 liters at bedtime   . Schizophrenia [facility])    patient reports that she is unaware of this  . Shortness of breath    with a little activy  . Sleep apnea    wears 2 liters of oxygen at bedtime instead of CPAP    Past Surgical History:  Procedure Laterality Date  . BALLOON DILATION N/A 06/10/2016   Procedure: BALLOON DILATION;  Surgeon: Wilford Corner, MD;  Location: [facility] ENDOSCOPY;  Service: Endoscopy;  Laterality: N/A;  . BREAST EXCISIONAL BIOPSY Left 05/10/2012   benign  . CHOLECYSTECTOMY    . COLONOSCOPY WITH PROPOFOL N/A 01/30/2017   Procedure: COLONOSCOPY WITH PROPOFOL;  Surgeon: Wilford Corner, MD;  Location: [facility];  Service: Endoscopy;  Laterality: N/A;  . ESOPHAGOGASTRODUODENOSCOPY N/A 06/03/2016   Procedure: ESOPHAGOGASTRODUODENOSCOPY (EGD);  Surgeon: Wilford Corner, MD;  Location: [facility] ENDOSCOPY;  Service: Endoscopy;  Laterality: N/A;  . ESOPHAGOGASTRODUODENOSCOPY N/A 06/10/2016   Procedure: ESOPHAGOGASTRODUODENOSCOPY (EGD);  Surgeon: Wilford Corner, MD;  Location: [facility] ENDOSCOPY;  Service: Endoscopy;  Laterality: N/A;  . EYE SURGERY    . PARTIAL MASTECTOMY WITH NEEDLE LOCALIZATION Left 05/10/2012   Procedure: PARTIAL MASTECTOMY WITH NEEDLE LOCALIZATION;  Surgeon: Edsel Petrin  Dalbert Batman, MD;  Location: [place];  Service: General;  Laterality: Left;  . TONSILLECTOMY      Family History  Problem Relation Age of Onset  . Alcohol abuse Father   . Stroke Father   . Alzheimer's disease Mother     Social History   Socioeconomic History  . Marital status: Divorced    Spouse name: Not on file  . Number of children: 0  . Years of education: HS  . Highest education level: Not on file  Occupational History  . Occupation: Retired   Scientific laboratory technician  . Financial resource strain: Not on file  . Food  insecurity:    Worry: Not on file    Inability: Not on file  . Transportation needs:    Medical: Not on file    Non-medical: Not on file  Tobacco Use  . Smoking status: Former Smoker    Years: 29.00    Types: Cigarettes    Last attempt to quit: 05/03/1993    Years since quitting: 25.0  . Smokeless tobacco: Never Used  Substance and Sexual Activity  . Alcohol use: No    Alcohol/week: 0.0 [name] drinks  . Drug use: No  . Sexual activity: Not Currently  Lifestyle  . Physical activity:    Days per week: Not on file    Minutes per session: Not on file  . Stress: Not on file  Relationships  . Social connections:    Talks on phone: Not on file    Gets together: Not on file    Attends religious service: Not on file    Active member of club or organization: Not on file    Attends meetings of clubs or organizations: Not on file    Relationship status: Not on file  . Intimate partner violence:    Fear of current or ex partner: Not on file    Emotionally abused: Not on file    Physically abused: Not on file    Forced sexual activity: Not on file  Other Topics Concern  . Not on file  Social History Narrative   Drinks 2 cups of coffee a day    No outpatient medications have been marked as taking for the 05/14/18 encounter (Appointment) with Miquel Dunn, NP.     Review of Systems  Constitution: Negative for decreased appetite, malaise/fatigue, weight gain and weight loss.  Eyes: Negative for visual disturbance.  Cardiovascular: Negative for chest pain, claudication, dyspnea on exertion, leg swelling, orthopnea, palpitations and syncope.  Respiratory: Negative for hemoptysis and wheezing.   Endocrine: Negative for cold intolerance and heat intolerance.  Hematologic/Lymphatic: Does not bruise/bleed easily.  Skin: Negative for nail changes.  Musculoskeletal: Negative for muscle weakness and myalgias.  Gastrointestinal: Negative for abdominal pain, change in bowel habit,  nausea and vomiting.  Neurological: Negative for difficulty with concentration, dizziness, focal weakness and headaches.  Psychiatric/Behavioral: Negative for altered mental status and suicidal ideas.  All other systems reviewed and are negative.      Objective:     There were no vitals taken for this visit.  Echocardiogram 06/12/2015: Poor echo window due to bodily habitus. Left ventricle cavity is normal in size. Mild concentric hypertrophy of the left ventricle. Normal global wall motion. Doppler evidence of grade I (impaired) diastolic dysfunction. Calculated EF 55%. Left atrial cavity is mildly dilated. Trace tricuspid regurgitation. Unable to estimate PA pressure due to absence/minimal TR signal. Lexiscan Myoview stress test, 01/26/2013, 2 day protocol. No evidence of pharmacologic  induced myocardial ischemia. Mild apical thinning involving the inferolateral wall. Ejection fraction 54%. Mild hypokinesis in the mid and basal portion of the anteroseptal. Low risk study.  Nuclear stress test [05/28/2015]: 1. Resting EKG demonstrates normal sinus rhythm normal axis, poor R-wave progression, low voltage complexes. Stress EKG is nondiagnostic for ischemia as a pharmacologic stress test stress symptoms included dyspnea. 2. Perfusion imaging study demonstrating small sized mild inferoapical defect suggestive of apical thinning but no demonstrable ischemia. LVEF was 82%. This represents a low risk study.  ***Stress test***  ***Coronary angiogram***  Physical Exam  Constitutional: She is oriented to person, place, and time. Vital signs are normal. She appears well-developed and well-nourished.  HENT:  Head: Normocephalic and atraumatic.  Neck: Normal range of motion.  Cardiovascular: Normal rate, regular rhythm, normal heart sounds and intact distal pulses.  Pulmonary/Chest: Effort normal and breath sounds normal. No accessory muscle usage. No respiratory distress.  Abdominal: Soft. Bowel  sounds are normal.  Musculoskeletal: Normal range of motion.  Neurological: She is alert and oriented to person, place, and time.  Skin: Skin is warm and dry.  Vitals reviewed.          Assessment & Recommendations:   There are no diagnoses linked to this encounter.  Recommendation:  Patient with type 2 diabetes, morbid obesity, hypertension, hyperlipidemia, last seen by [place] in 2017 referred back to [place] for evaluation of progressively worsening dyspnea and fatigue over the last 6 months.  She previously underwent lexiscan nuclear stress test in 2017 that was low risk. Echocardiogram also performed at that time revealed diastolic dysfunction, otherwise normal echocardiogram.  Patient's urine 4 week office visit follow-up for shortness of breath. Patient has been making diet changes and been walking in her driveway and around her house daily and has lost 5 pounds. I congratulated her on her efforts and provided positive reinforcement. I continue to feel that her shortness of breath is related to obesity with hypoventilation and also sleep apnea. She is to be evaluated by Dr. Liylah Nordmann in the next few weeks. She continues to have shortness of breath despite losing some weight, we will obtain echocardiogram for further evaluation. Unless she has wall motion abnormality, do not feel that she needs stress testing. No symptoms of angina. And risk factors have been well-controlled except for her obesity. I will see her back in 6 weeks after her echocardiogram for follow-up.    Jeri Lager, FNP-C [facility], [place] Office: [phone] Fax: [phone]

## 2018-05-17 DIAGNOSIS — E039 Hypothyroidism, unspecified: Secondary | ICD-10-CM | POA: Diagnosis not present

## 2018-05-17 DIAGNOSIS — D509 Iron deficiency anemia, unspecified: Secondary | ICD-10-CM | POA: Diagnosis not present

## 2018-05-17 DIAGNOSIS — I10 Essential (primary) hypertension: Secondary | ICD-10-CM | POA: Diagnosis not present

## 2018-05-17 DIAGNOSIS — E119 Type 2 diabetes mellitus without complications: Secondary | ICD-10-CM | POA: Diagnosis not present

## 2018-05-18 ENCOUNTER — Ambulatory Visit (INDEPENDENT_AMBULATORY_CARE_PROVIDER_SITE_OTHER): Payer: Medicare Other | Admitting: Neurology

## 2018-05-18 DIAGNOSIS — G4720 Circadian rhythm sleep disorder, unspecified type: Secondary | ICD-10-CM

## 2018-05-18 DIAGNOSIS — G4734 Idiopathic sleep related nonobstructive alveolar hypoventilation: Secondary | ICD-10-CM | POA: Diagnosis not present

## 2018-05-18 DIAGNOSIS — R0683 Snoring: Secondary | ICD-10-CM

## 2018-05-18 DIAGNOSIS — G4719 Other hypersomnia: Secondary | ICD-10-CM

## 2018-05-18 DIAGNOSIS — Z9981 Dependence on supplemental oxygen: Secondary | ICD-10-CM

## 2018-05-18 DIAGNOSIS — G478 Other sleep disorders: Secondary | ICD-10-CM

## 2018-05-18 DIAGNOSIS — R351 Nocturia: Secondary | ICD-10-CM

## 2018-05-18 DIAGNOSIS — Z6841 Body Mass Index (BMI) 40.0 and over, adult: Secondary | ICD-10-CM

## 2018-05-18 DIAGNOSIS — G2581 Restless legs syndrome: Secondary | ICD-10-CM

## 2018-05-20 NOTE — Progress Notes (Signed)
Patient referred by PCP and last seen by me on 04/27/18 for re-eval for OSA concern.  Please call and notify the patient that the recent sleep study did not show any significant obstructive sleep apnea. Unfortunately, she did not sleep very much or very well at all, no REM sleep. No need during this study for suppl. O2 either. Limited results. She can keep her followup appointment with MM in August.   Thanks,  Star Age, MD, PhD [facility])

## 2018-05-20 NOTE — Procedures (Signed)
PATIENT'S NAME:  Chelsea [name], Chelsea [name] DOB:      1950-09-10      MR#:    [id number]     DATE OF RECORDING: 05/18/2018 REFERRING M.D.:  Rachell Cipro, MD Study Performed:   Baseline Polysomnogram HISTORY: 68 year old woman with a history of hypothyroidism, hypertension, type 2 diabetes, hyperlipidemia, morbid obesity, mood disorder, Vitamin D deficiency, allergic rhinitis, osteoarthritis, who presents for evaluation of obstructive sleep apnea. She has an O2 concentrator. The patient endorsed the Epworth Sleepiness Scale at 6/24 points. The patient's weight 227 pounds with a height of 62 (inches), resulting in a BMI of 41.8 kg/m2.  The patient's neck circumference measured 15.5 inches.  CURRENT MEDICATIONS: Aspirin, Bumex, Vitam D3, Klonop, Ferrous slfate, Levemir, Invokana, Synthroid, Glucophage, Ozempic, Paxil, Requip, Crestor, Desyrel, Geodon.   PROCEDURE:  This is a multichannel digital polysomnogram utilizing the Somnostar 11.2 system.  Electrodes and sensors were applied and monitored per AASM Specifications.   EEG, EOG, Chin and Limb EMG, were sampled at 200 Hz.  ECG, Snore and Nasal Pressure, Thermal Airflow, Respiratory Effort, CPAP Flow and Pressure, Oximetry was sampled at 50 Hz. Digital video and audio were recorded.      BASELINE STUDY  The study was conducted without supplemental oxygen. Lights Out was at 22:32 and Lights On at 05:09.  Total recording time (TRT) was 397.5 minutes, with a total sleep time (TST) of 124.5 minutes.   The patient's sleep latency was delayed. REM sleep was absent. The sleep efficiency was 31.3 %, which is markedly reduced.     SLEEP ARCHITECTURE: WASO (Wake after sleep onset) was 257.5 minutes with one very long period of wakefulness of 2 hours and 20 minutes and otherwise severe sleep fragmentation noted. There were 23 minutes in Stage N1, 101.5 minutes Stage N2, 0 minutes Stage N3 and 0 minutes in Stage REM.  The percentage of Stage N1 was 18.5%, Stage N2 was  81.5%, both markedly increased, and Stage and Stage R (REM sleep) were absent. The arousals were noted as: 64 were spontaneous, 14 were associated with PLMs, 2 were associated with respiratory events.    RESPIRATORY ANALYSIS:  There were a total of 6 respiratory events:  4 obstructive apneas, 0 central apneas and 0 mixed apneas with a total of 4 apneas and an apnea index (AI) of 1.9 /hour. There were 2 hypopneas with a hypopnea index of 1. /hour. The patient also had 0 respiratory event related arousals (RERAs).      The total APNEA/HYPOPNEA INDEX (AHI) was 2.9/hour and the total RESPIRATORY DISTURBANCE INDEX was 2.9 /hour.  0 events occurred in REM sleep and 4 events in NREM. The REM AHI was n/a, versus a non-REM AHI of 2.9. The patient spent 124.5 minutes of total sleep time in the supine position and 0 minutes in non-supine.The supine AHI was 2.9 versus a non-supine AHI of 0.0. OXYGEN SATURATION & C02:  The Wake baseline 02 saturation was 94%, with the lowest being 87%. Time spent below 89% saturation equaled 0 minutes.   PERIODIC LIMB MOVEMENTS: The patient had a total of 29 Periodic Limb Movements.  The Periodic Limb Movement (PLM) index was 14. and the PLM Arousal index was 6.7/hour.  Audio and video analysis did not show any abnormal or unusual movements, behaviors, phonations or vocalizations. The patient took 2 bathroom breaks. Mild intermittent snoring was noted. The EKG was in keeping with normal sinus rhythm (NSR).  Post-study, the patient indicated that sleep was worse than usual.  IMPRESSION:  1. Poor sleep efficiency 2. Primary Snoring 3. Dysfunctions associated with sleep stages or arousal from sleep  RECOMMENDATIONS:  1. This study does not demonstrate any significant obstructive or central sleep disordered breathing, but study was very limited due to poor sleep consolidation and absence of REM sleep. Supplemental oxygen was not used during this study.  2. This study shows  sleep fragmentation and abnormal sleep stage percentages; these are nonspecific findings and per se do not signify an intrinsic sleep disorder or a cause for the patient's sleep-related symptoms. Causes include (but are not limited to) the first night effect of the sleep study, circadian rhythm disturbances, medication effect or an underlying mood disorder or medical problem.  3. The patient should be cautioned not to drive, work at heights, or operate dangerous or heavy equipment when tired or sleepy. Review and reiteration of good sleep hygiene measures should be pursued with any patient. 4. The patient will be seen in follow-up in the sleep clinic at [facility] for discussion of the test results, symptom and treatment compliance review, further management strategies, etc. The referring provider will be notified of the test results.  I certify that I have reviewed the entire raw data recording prior to the issuance of this report in accordance with the Standards of Accreditation of the American Academy of Sleep Medicine (AASM)    Star Age, MD, PhD Diplomat, American Board of Neurology and Sleep Medicine (Neurology and Sleep Medicine)

## 2018-05-21 ENCOUNTER — Other Ambulatory Visit: Payer: Self-pay

## 2018-05-21 DIAGNOSIS — E119 Type 2 diabetes mellitus without complications: Secondary | ICD-10-CM | POA: Diagnosis not present

## 2018-05-21 DIAGNOSIS — D509 Iron deficiency anemia, unspecified: Secondary | ICD-10-CM | POA: Diagnosis not present

## 2018-05-21 DIAGNOSIS — Z6841 Body Mass Index (BMI) 40.0 and over, adult: Secondary | ICD-10-CM | POA: Diagnosis not present

## 2018-05-21 DIAGNOSIS — I10 Essential (primary) hypertension: Secondary | ICD-10-CM | POA: Diagnosis not present

## 2018-05-21 DIAGNOSIS — E039 Hypothyroidism, unspecified: Secondary | ICD-10-CM | POA: Diagnosis not present

## 2018-05-24 ENCOUNTER — Telehealth: Payer: Self-pay

## 2018-05-24 ENCOUNTER — Ambulatory Visit: Payer: Medicare Other

## 2018-05-24 DIAGNOSIS — R0609 Other forms of dyspnea: Principal | ICD-10-CM

## 2018-05-24 NOTE — Telephone Encounter (Signed)
-----   Message from Star Age, MD sent at 05/20/2018  1:43 PM EST ----- Patient referred by PCP and last seen by me on 04/27/18 for re-eval for OSA concern.  Please call and notify the patient that the recent sleep study did not show any significant obstructive sleep apnea. Unfortunately, she did not sleep very much or very well at all, no REM sleep. No need during this study for suppl. O2 either. Limited results. She can keep her followup appointment with MM in August.   Thanks,  Star Age, MD, PhD [facility])

## 2018-05-24 NOTE — Telephone Encounter (Signed)
I called pt to discuss her sleep study results. No answer, left a message asking her to call me back. 

## 2018-05-25 NOTE — Telephone Encounter (Signed)
Pt's sister, [name], per [facility], returned my call. I discussed with her pt's sleep study results. Pt's sister will discuss with Dr. Ernie Hew the need for continued O2 at night with the consideration that this sleep study offered limited results. I reminded pt's sister of pt's appt with MM in August. Pt's sister verbalized understanding of results and had no further questions at this time.

## 2018-06-03 ENCOUNTER — Encounter: Payer: Self-pay | Admitting: Cardiology

## 2018-06-03 ENCOUNTER — Ambulatory Visit (INDEPENDENT_AMBULATORY_CARE_PROVIDER_SITE_OTHER): Payer: Medicare Other | Admitting: Cardiology

## 2018-06-03 ENCOUNTER — Other Ambulatory Visit: Payer: Self-pay | Admitting: Family Medicine

## 2018-06-03 VITALS — BP 137/68 | HR 100 | Ht 62.0 in | Wt 227.0 lb

## 2018-06-03 DIAGNOSIS — Z794 Long term (current) use of insulin: Secondary | ICD-10-CM | POA: Diagnosis not present

## 2018-06-03 DIAGNOSIS — I5032 Chronic diastolic (congestive) heart failure: Secondary | ICD-10-CM | POA: Diagnosis not present

## 2018-06-03 DIAGNOSIS — R0609 Other forms of dyspnea: Secondary | ICD-10-CM

## 2018-06-03 DIAGNOSIS — E119 Type 2 diabetes mellitus without complications: Secondary | ICD-10-CM

## 2018-06-03 DIAGNOSIS — Z1231 Encounter for screening mammogram for malignant neoplasm of breast: Secondary | ICD-10-CM

## 2018-06-03 NOTE — Progress Notes (Signed)
Subjective:   Chelsea [name], female    DOB: 02-17-1951, 68 y.o.   MRN: [id number]  Fanny Bien, MD:  Chief Complaint  Patient presents with  . Hypertension    6 week F/U after ECHO    HPI: Chelsea [name]  is a 68 y.o. female  with type 2 diabetes, morbid obesity, hypertension, hyperlipidemia, last seen by [place] in 2017 referred back to [place] for evaluation of progressively worsening dyspnea and fatigue over the last 6 months.  She previously underwent stress testing in 2014 that was normal. She recently underwent echocardiogram on 05/26/2018 that showed grade 1 diastolic dysfunction and slight increase in TR to mild to moderate and mild pulmonary hypertension compared to previous echo in 2017. Her brother-in-law, in which she lives with, is present at bedside.  Patient has been counseled on the importance of diet modifications and has made some changes; however, her brother in law does mention that she does snack on junk food through out the day. She is not very active. Denies any exertional chest discomfort. Fatigue improved with correcting anemia.   Patient is currently on nocturnal oxygen and has recently had sleep study that he reports did not reveal sleep apnea.   She lives with her sister and brother-in-law.     Past Medical History:  Diagnosis Date  . Anxiety   . Arthritis   . Bipolar disorder ([place])   . COPD (chronic obstructive pulmonary disease) ([place])   . Diabetes mellitus type II   . Esophageal stricture    moderate distal  . GERD (gastroesophageal reflux disease)   . Heart murmur    "HAD AN ECHO YEARS AGO- NOTHING TO BE CONCERNED ABOUT"  . Hyperlipemia   . Hypertension   . Hypothyroidism   . Mental disorder    Bipolar  . Obesity   . Oxygen desaturation during sleep    wears 2 liters at bedtime   . Schizophrenia [facility])    patient reports that she is unaware of this  . Shortness of breath    with a little activy  . Sleep apnea    wears 2 liters of  oxygen at bedtime instead of CPAP    Past Surgical History:  Procedure Laterality Date  . BALLOON DILATION N/A 06/10/2016   Procedure: BALLOON DILATION;  Surgeon: Wilford Corner, MD;  Location: [facility] ENDOSCOPY;  Service: Endoscopy;  Laterality: N/A;  . BREAST EXCISIONAL BIOPSY Left 05/10/2012   benign  . CHOLECYSTECTOMY    . COLONOSCOPY WITH PROPOFOL N/A 01/30/2017   Procedure: COLONOSCOPY WITH PROPOFOL;  Surgeon: Wilford Corner, MD;  Location: [place];  Service: Endoscopy;  Laterality: N/A;  . ESOPHAGOGASTRODUODENOSCOPY N/A 06/03/2016   Procedure: ESOPHAGOGASTRODUODENOSCOPY (EGD);  Surgeon: Wilford Corner, MD;  Location: [facility] ENDOSCOPY;  Service: Endoscopy;  Laterality: N/A;  . ESOPHAGOGASTRODUODENOSCOPY N/A 06/10/2016   Procedure: ESOPHAGOGASTRODUODENOSCOPY (EGD);  Surgeon: Wilford Corner, MD;  Location: [facility] ENDOSCOPY;  Service: Endoscopy;  Laterality: N/A;  . EYE SURGERY    . PARTIAL MASTECTOMY WITH NEEDLE LOCALIZATION Left 05/10/2012   Procedure: PARTIAL MASTECTOMY WITH NEEDLE LOCALIZATION;  Surgeon: Adin Hector, MD;  Location: [place];  Service: General;  Laterality: Left;  . TONSILLECTOMY      Family History  Problem Relation Age of Onset  . Alcohol abuse Father   . Stroke Father   . Alzheimer's disease Mother     Social History   Socioeconomic History  . Marital status: Divorced    Spouse name:  Not on file  . Number of children: 0  . Years of education: HS  . Highest education level: Not on file  Occupational History  . Occupation: Retired   Scientific laboratory technician  . Financial resource strain: Not on file  . Food insecurity:    Worry: Not on file    Inability: Not on file  . Transportation needs:    Medical: Not on file    Non-medical: Not on file  Tobacco Use  . Smoking status: Former Smoker    Packs/day: 3.00    Years: 29.00    Pack years: 87.00    Types: Cigarettes    Last attempt to quit: 05/03/1993    Years since quitting: 25.1  . Smokeless tobacco: Never  Used  Substance and Sexual Activity  . Alcohol use: No    Alcohol/week: 0.0 [name] drinks  . Drug use: No  . Sexual activity: Not Currently  Lifestyle  . Physical activity:    Days per week: Not on file    Minutes per session: Not on file  . Stress: Not on file  Relationships  . Social connections:    Talks on phone: Not on file    Gets together: Not on file    Attends religious service: Not on file    Active member of club or organization: Not on file    Attends meetings of clubs or organizations: Not on file    Relationship status: Not on file  . Intimate partner violence:    Fear of current or ex partner: Not on file    Emotionally abused: Not on file    Physically abused: Not on file    Forced sexual activity: Not on file  Other Topics Concern  . Not on file  Social History Narrative   Drinks 2 cups of coffee a day    Current Meds  Medication Sig  . aspirin 81 MG chewable tablet Chew 81 mg by mouth daily.  . bumetanide (BUMEX) 1 MG tablet Take 1 tablet (1 mg total) by mouth daily.  . Cholecalciferol (VITAMIN D3) 5000 units TABS Take by mouth.  . clonazePAM (KLONOPIN) 1 MG tablet Take 1 tablet (1 mg total) by mouth at bedtime.  . Ferrous Sulfate (IRON SUPPLEMENT PO) Take by mouth.  . fluticasone furoate-vilanterol (BREO ELLIPTA) 200-25 MCG/INH AEPB Inhale 1 puff into the lungs every morning.  . Insulin Detemir (LEVEMIR FLEXPEN) 100 UNIT/ML SOPN Inject 100 Units into the skin at bedtime.   . INVOKANA 300 MG TABS tablet Take 300 mg by mouth daily before breakfast.   . levothyroxine (SYNTHROID) 150 MCG tablet Take 150 mcg by mouth daily.  . metFORMIN (GLUCOPHAGE) 1000 MG tablet Take 500 mg by mouth 2 (two) times daily with a meal.   . OZEMPIC, 1 MG/DOSE, 2 MG/1.5ML SOPN INJECT 1 MG SUBCUTANEOUSLY ONCE A WEEK  . PARoxetine (PAXIL) 40 MG tablet Take 1 tablet (40 mg total) by mouth every morning.  Marland Kitchen rOPINIRole (REQUIP) 0.25 MG tablet TAKE 3 TABLETS BY MOUTH AT BEDTIME  .  rosuvastatin (CRESTOR) 20 MG tablet Take 20 mg by mouth daily.  . traZODone (DESYREL) 100 MG tablet Take 2 tablets (200 mg total) by mouth at bedtime.  . ziprasidone (GEODON) 80 MG capsule Take 1 capsule (80 mg total) by mouth at bedtime.     Review of Systems  Constitution: Negative for decreased appetite, malaise/fatigue, weight gain and weight loss.  Eyes: Negative for visual disturbance.  Cardiovascular: Positive for dyspnea on  exertion. Negative for chest pain, claudication, leg swelling, orthopnea, palpitations and syncope.  Respiratory: Negative for hemoptysis and wheezing.   Endocrine: Negative for cold intolerance and heat intolerance.  Hematologic/Lymphatic: Does not bruise/bleed easily.  Skin: Negative for nail changes.  Musculoskeletal: Negative for muscle weakness and myalgias.  Gastrointestinal: Negative for abdominal pain, change in bowel habit, nausea and vomiting.  Neurological: Negative for difficulty with concentration, dizziness, focal weakness and headaches.  Psychiatric/Behavioral: Negative for altered mental status and suicidal ideas.  All other systems reviewed and are negative.      Objective:     Blood pressure 137/68, pulse 100, height '5\' 2"'  (1.575 m), weight 227 lb (103 kg), SpO2 95 %.  Cardiac studies:  Echocardiogram 05/24/2018: Left ventricle cavity is normal in size. Mild concentric hypertrophy of the left ventricle. Normal global wall motion. Doppler evidence of grade I (impaired) diastolic dysfunction, normal LAP. Calculated EF 55%. Mild to moderate tricuspid regurgitation. Estimated pulmonary artery systolic pressure 36 mmHg. Compared to previous visit in 2017, tricuspid regurgitation is more prominent, mild pulmonary hypertension is new.  Nuclear stress test [05/28/2015]: 1. Resting EKG demonstrates normal sinus rhythm normal axis, poor R-wave progression, low voltage complexes. Stress EKG is nondiagnostic for ischemia as a pharmacologic stress  test stress symptoms included dyspnea. 2. Perfusion imaging study demonstrating small sized mild inferoapical defect suggestive of apical thinning but no demonstrable ischemia. LVEF was 82%. This represents a low risk study.  Lower Extremity Venous Duplex Bilateral 12/04/2012: No evidence of deep vein or superficial thrombosisinvolving the right lower extremity and left lower extremity. No evidence of Baker's cyst on the right or left.  Split night study 05/18/2018: Impression: 1. Poor sleep efficiency. 2. Primary Snoring. 3. Dysfunctions associated with sleep stages or arousal from sleep.  Sleep Study [2011]: Sleeps with CPAP.   Recent Labs: 01/19/2018: Hemoglobin 10.7, microcytic indices, CBC otherwise normal. Potassium 4.7, creatinine 0.9, EGFR 67/78, CMP normal. Ferritin normal. 01/02/2013: HbA1c 6.7%.  Physical Exam  Constitutional: She appears well-developed. No distress.  HENT:  Head: Atraumatic.  Eyes: Conjunctivae are normal.  Neck: Neck supple. No JVD present. No thyromegaly present.  Cardiovascular: Normal rate, regular rhythm and normal heart sounds. Exam reveals no gallop.  No murmur heard. Pulses:      Carotid pulses are 2+ on the right side and 2+ on the left side.      Dorsalis pedis pulses are 2+ on the right side and 2+ on the left side.       Posterior tibial pulses are 2+ on the right side and 2+ on the left side.  Femoral and popliteal pulse difficult to feel due to patient's body habitus.   Pulmonary/Chest: Effort normal and breath sounds normal.  Abdominal: Soft. Bowel sounds are normal.  Musculoskeletal: Normal range of motion.        General: No edema.  Neurological: She is alert.  Skin: Skin is warm and dry.  Psychiatric: She has a normal mood and affect.        Assessment & Recommendations:  1. Dyspnea on exertion I have discussed recently obtained echocardiogram results with the patient and her brother in law. Had mild pulmonary hypertension on echo  that is likely secondary to COPD and also her obesity. I suspect that her dyspnea on exertion is related to obesity and deconditioning. Do not feel that she needs stress testing at this time. If she continues to have symptoms despite lifestyle modifications, will consider this.   2. Chronic diastolic (congestive)  heart failure (HCC) No clinical evidence of heart failure on exam. Continue with current medical therapy. Weight loss will also help with this.   3. Morbid obesity ([employer]) Extensive discussion regarding diet modifications. She has lost weight since I last saw her. I encouraged her to continue. I have encouraged her to try to increase her activity level and stamina with even walking up and down her hallway in her house or up and down the driveway. Her brother-in-law is a retired PT and will help her with this.   4. Controlled type 2 diabetes mellitus without complication, with long-term current use of insulin ([place]) Being managed by her PCP and is stable. Discussed that diet modifications can help with this as well.   Plan: Feel that patients symptoms are related to her obesity and inactivity. Patient and her brother-in-law agree with this. They will contact me for any worsening symptoms. I would like to see her back in 6 months to improve compliance with this and if she remains stable, will consider making her PRN at that time.       Jeri Lager, MSN, APRN, FNP-C [facility], [place] Office: [phone] Fax: [phone]

## 2018-06-04 DIAGNOSIS — Z6841 Body Mass Index (BMI) 40.0 and over, adult: Secondary | ICD-10-CM | POA: Diagnosis not present

## 2018-06-04 DIAGNOSIS — N390 Urinary tract infection, site not specified: Secondary | ICD-10-CM | POA: Diagnosis not present

## 2018-06-04 DIAGNOSIS — N159 Renal tubulo-interstitial disease, unspecified: Secondary | ICD-10-CM | POA: Diagnosis not present

## 2018-06-10 ENCOUNTER — Encounter (HOSPITAL_COMMUNITY): Payer: Self-pay | Admitting: Psychiatry

## 2018-06-10 ENCOUNTER — Other Ambulatory Visit: Payer: Self-pay

## 2018-06-10 ENCOUNTER — Ambulatory Visit (INDEPENDENT_AMBULATORY_CARE_PROVIDER_SITE_OTHER): Payer: Medicare Other | Admitting: Psychiatry

## 2018-06-10 DIAGNOSIS — F411 Generalized anxiety disorder: Secondary | ICD-10-CM

## 2018-06-10 DIAGNOSIS — F200 Paranoid schizophrenia: Secondary | ICD-10-CM

## 2018-06-10 MED ORDER — TRAZODONE HCL 100 MG PO TABS: 200.0000 mg | ORAL_TABLET | Freq: Every day | ORAL | 0 refills | Status: DC

## 2018-06-10 MED ORDER — PAROXETINE HCL 40 MG PO TABS: 40.0000 mg | ORAL_TABLET | Freq: Every morning | ORAL | 0 refills | Status: DC

## 2018-06-10 MED ORDER — ZIPRASIDONE HCL 80 MG PO CAPS: 80.0000 mg | ORAL_CAPSULE | Freq: Every day | ORAL | 0 refills | Status: DC

## 2018-06-10 MED ORDER — CLONAZEPAM 1 MG PO TABS: 1.0000 mg | ORAL_TABLET | Freq: Every day | ORAL | 0 refills | Status: DC

## 2018-06-10 NOTE — Progress Notes (Signed)
BH MD/PA/NP OP Progress Note  06/10/2018 10:53 AM Chelsea [name]  MRN:  [id number]  Chief Complaint: I am doing okay.  I am taking my medication.  HPI: Chelsea [name] came for her follow-up appointment.  She is compliant with medication and denies any paranoia, hallucination or any irritability.  Recently she had sleep study and she was told that she does not have sleep apnea.  She has restless leg and she takes Requip.  She describes her mood is okay.  She denies any suicidal thoughts or homicidal thought.  She lives with her sister and she endorsed good relationship with her.  She usually walks 2-3 times a day outside her house if weather is okay.  She regret gaining weight since last year holidays.  She has no tremors, shakes or any EPS.  She denies any anxiety attack.  She feels the current medicine helping and working.  Patient denies drinking or using any illegal substances.  She does not drive and her brother-in-law usually bring her to the appointments.  Visit Diagnosis:    ICD-10-CM   1. Paranoid schizophrenia ([place]) F20.0 ziprasidone (GEODON) 80 MG capsule    traZODone (DESYREL) 100 MG tablet    PARoxetine (PAXIL) 40 MG tablet    clonazePAM (KLONOPIN) 1 MG tablet  2. Generalized anxiety disorder F41.1 clonazePAM (KLONOPIN) 1 MG tablet    Past Psychiatric History: Reviewed. H/O inpatient at [facility] and [facility] for mania and psychosis. No h/o suicidal attempt.   Past Medical History:  Past Medical History:  Diagnosis Date  . Anxiety   . Arthritis   . Bipolar disorder ([place])   . COPD (chronic obstructive pulmonary disease) ([place])   . Diabetes mellitus type II   . Esophageal stricture    moderate distal  . GERD (gastroesophageal reflux disease)   . Heart murmur    "HAD AN ECHO YEARS AGO- NOTHING TO BE CONCERNED ABOUT"  . Hyperlipemia   . Hypertension   . Hypothyroidism   . Mental disorder    Bipolar  . Obesity   . Oxygen desaturation during sleep    wears 2 liters at  bedtime   . Schizophrenia [facility])    patient reports that she is unaware of this  . Shortness of breath    with a little activy  . Sleep apnea    wears 2 liters of oxygen at bedtime instead of CPAP    Past Surgical History:  Procedure Laterality Date  . BALLOON DILATION N/A 06/10/2016   Procedure: BALLOON DILATION;  Surgeon: Wilford Corner, MD;  Location: [facility] ENDOSCOPY;  Service: Endoscopy;  Laterality: N/A;  . BREAST EXCISIONAL BIOPSY Left 05/10/2012   benign  . CHOLECYSTECTOMY    . COLONOSCOPY WITH PROPOFOL N/A 01/30/2017   Procedure: COLONOSCOPY WITH PROPOFOL;  Surgeon: Wilford Corner, MD;  Location: [place];  Service: Endoscopy;  Laterality: N/A;  . ESOPHAGOGASTRODUODENOSCOPY N/A 06/03/2016   Procedure: ESOPHAGOGASTRODUODENOSCOPY (EGD);  Surgeon: Wilford Corner, MD;  Location: [facility] ENDOSCOPY;  Service: Endoscopy;  Laterality: N/A;  . ESOPHAGOGASTRODUODENOSCOPY N/A 06/10/2016   Procedure: ESOPHAGOGASTRODUODENOSCOPY (EGD);  Surgeon: Wilford Corner, MD;  Location: [facility] ENDOSCOPY;  Service: Endoscopy;  Laterality: N/A;  . EYE SURGERY    . PARTIAL MASTECTOMY WITH NEEDLE LOCALIZATION Left 05/10/2012   Procedure: PARTIAL MASTECTOMY WITH NEEDLE LOCALIZATION;  Surgeon: Adin Hector, MD;  Location: [place];  Service: General;  Laterality: Left;  . TONSILLECTOMY      Family Psychiatric History: Reviewed.  Family History:  Family History  Problem Relation Age of Onset  . Alcohol abuse Father   . Stroke Father   . Alzheimer's disease Mother     Social History:  Social History   Socioeconomic History  . Marital status: Divorced    Spouse name: Not on file  . Number of children: 0  . Years of education: HS  . Highest education level: Not on file  Occupational History  . Occupation: Retired   Scientific laboratory technician  . Financial resource strain: Not on file  . Food insecurity:    Worry: Not on file    Inability: Not on file  . Transportation needs:    Medical: Not on file     Non-medical: Not on file  Tobacco Use  . Smoking status: Former Smoker    Packs/day: 3.00    Years: 29.00    Pack years: 87.00    Types: Cigarettes    Last attempt to quit: 05/03/1993    Years since quitting: 25.1  . Smokeless tobacco: Never Used  Substance and Sexual Activity  . Alcohol use: No    Alcohol/week: 0.0 standard drinks  . Drug use: No  . Sexual activity: Not Currently  Lifestyle  . Physical activity:    Days per week: Not on file    Minutes per session: Not on file  . Stress: Not on file  Relationships  . Social connections:    Talks on phone: Not on file    Gets together: Not on file    Attends religious service: Not on file    Active member of club or organization: Not on file    Attends meetings of clubs or organizations: Not on file    Relationship status: Not on file  Other Topics Concern  . Not on file  Social History Narrative   Drinks 2 cups of coffee a day    Allergies:  Allergies  Allergen Reactions  . Darvocet [Propoxyphene N-Acetaminophen] Anaphylaxis    Can take plain tylenol  . Fluoxetine Other (See Comments)    Hallucinations   . Penicillins Rash    REACTION: rash  . Promethazine Hives    Metabolic Disorder Labs: Lab Results  Component Value Date   HGBA1C 6.8 (H) 12/03/2012   MPG 148 (H) 12/03/2012   No results found for: PROLACTIN No results found for: CHOL, TRIG, HDL, CHOLHDL, VLDL, LDLCALC Lab Results  Component Value Date   TSH 1.509 12/03/2012    Therapeutic Level Labs: No results found for: LITHIUM No results found for: VALPROATE No components found for:  CBMZ  Current Medications: Current Outpatient Medications  Medication Sig Dispense Refill  . aspirin 81 MG chewable tablet Chew 81 mg by mouth daily.    . bumetanide (BUMEX) 1 MG tablet Take 1 tablet (1 mg total) by mouth daily. 30 tablet 0  . Cholecalciferol (VITAMIN D3) 5000 units TABS Take by mouth.    . clonazePAM (KLONOPIN) 1 MG tablet Take 1 tablet (1 mg  total) by mouth at bedtime. 90 tablet 0  . Ferrous Sulfate (IRON SUPPLEMENT PO) Take by mouth.    . fluticasone furoate-vilanterol (BREO ELLIPTA) 200-25 MCG/INH AEPB Inhale 1 puff into the lungs every morning.    . Insulin Detemir (LEVEMIR FLEXPEN) 100 UNIT/ML SOPN Inject 100 Units into the skin at bedtime.     . INVOKANA 300 MG TABS tablet Take 300 mg by mouth daily before breakfast.     . levothyroxine (SYNTHROID) 150 MCG tablet Take 150 mcg by mouth daily.    Marland Kitchen  metFORMIN (GLUCOPHAGE) 1000 MG tablet Take 500 mg by mouth 2 (two) times daily with a meal.     . OZEMPIC, 1 MG/DOSE, 2 MG/1.5ML SOPN INJECT 1 MG SUBCUTANEOUSLY ONCE A WEEK    . PARoxetine (PAXIL) 40 MG tablet Take 1 tablet (40 mg total) by mouth every morning. 90 tablet 0  . rOPINIRole (REQUIP) 0.25 MG tablet TAKE 3 TABLETS BY MOUTH AT BEDTIME 90 tablet 5  . rosuvastatin (CRESTOR) 20 MG tablet Take 20 mg by mouth daily.    . traZODone (DESYREL) 100 MG tablet Take 2 tablets (200 mg total) by mouth at bedtime. 180 tablet 0  . ziprasidone (GEODON) 80 MG capsule Take 1 capsule (80 mg total) by mouth at bedtime. 90 capsule 0   No current facility-administered medications for this visit.      Musculoskeletal: Strength & Muscle Tone: within normal limits Gait & Station: normal Patient leans: N/A  Psychiatric Specialty Exam: ROS  Blood pressure 99/67, pulse 78, height 5\' 2"  (1.575 m), weight 228 lb (103.4 kg).Body mass index is 41.7 kg/m.  General Appearance: Casual  Eye Contact:  Fair  Speech:  Slow  Volume:  Decreased  Mood:  Euthymic  Affect:  Congruent  Thought Process:  Descriptions of Associations: Intact  Orientation:  Full (Time, Place, and Person)  Thought Content: Logical   Suicidal Thoughts:  No  Homicidal Thoughts:  No  Memory:  Immediate;   Fair Recent;   Fair Remote;   Fair  Judgement:  Good  Insight:  Good  Psychomotor Activity:  mild tremors in hand  Concentration:  Concentration: Fair and Attention  Span: Fair  Recall:  [employer] of Knowledge: Good  Language: Good  Akathisia:  No  Handed:  Right  AIMS (if indicated): not done  Assets:  Communication Skills Desire for Improvement Housing Resilience Social Support  ADL's:  Intact  Cognition: WNL  Sleep:  Fair   Screenings:   Assessment and Plan: Schizophrenia chronic paranoid type.  Generalized anxiety disorder.  Patient is a stable on her current medication.  She has mild tremors but does not interfere in her daily activities.  Recently she had sleep study and she was told that she does not have sleep apnea.  She like to continue her current medication.  I will continue Geodon 80 mg at bedtime, Klonopin 1 mg at bedtime, trazodone 200 mg at bedtime and Paxil 40 mg daily.  She is seeing Dr. Ernie Hew for her primary care needs.  Encourage healthy lifestyle and watch her calorie intake.  Recommended to call us back if she has any question or any concern.  She is not interested in therapy.  Follow-up in 3 months.   Kathlee Nations, MD 06/10/2018, 10:53 AM

## 2018-06-11 DIAGNOSIS — Z6841 Body Mass Index (BMI) 40.0 and over, adult: Secondary | ICD-10-CM | POA: Diagnosis not present

## 2018-06-11 DIAGNOSIS — E662 Morbid (severe) obesity with alveolar hypoventilation: Secondary | ICD-10-CM | POA: Diagnosis not present

## 2018-06-11 DIAGNOSIS — R0602 Shortness of breath: Secondary | ICD-10-CM | POA: Diagnosis not present

## 2018-06-11 DIAGNOSIS — N390 Urinary tract infection, site not specified: Secondary | ICD-10-CM | POA: Diagnosis not present

## 2018-06-28 ENCOUNTER — Other Ambulatory Visit (HOSPITAL_COMMUNITY): Payer: Self-pay | Admitting: Psychiatry

## 2018-06-28 DIAGNOSIS — F411 Generalized anxiety disorder: Secondary | ICD-10-CM

## 2018-06-28 DIAGNOSIS — F200 Paranoid schizophrenia: Secondary | ICD-10-CM

## 2018-07-01 ENCOUNTER — Ambulatory Visit: Payer: Self-pay

## 2018-08-16 DIAGNOSIS — D649 Anemia, unspecified: Secondary | ICD-10-CM | POA: Diagnosis not present

## 2018-08-16 DIAGNOSIS — N19 Unspecified kidney failure: Secondary | ICD-10-CM | POA: Diagnosis not present

## 2018-08-16 DIAGNOSIS — I10 Essential (primary) hypertension: Secondary | ICD-10-CM | POA: Diagnosis not present

## 2018-08-16 DIAGNOSIS — E119 Type 2 diabetes mellitus without complications: Secondary | ICD-10-CM | POA: Diagnosis not present

## 2018-08-16 DIAGNOSIS — I509 Heart failure, unspecified: Secondary | ICD-10-CM | POA: Diagnosis not present

## 2018-08-16 DIAGNOSIS — R0602 Shortness of breath: Secondary | ICD-10-CM | POA: Diagnosis not present

## 2018-08-20 DIAGNOSIS — J449 Chronic obstructive pulmonary disease, unspecified: Secondary | ICD-10-CM | POA: Diagnosis not present

## 2018-08-20 DIAGNOSIS — D509 Iron deficiency anemia, unspecified: Secondary | ICD-10-CM | POA: Diagnosis not present

## 2018-08-20 DIAGNOSIS — E782 Mixed hyperlipidemia: Secondary | ICD-10-CM | POA: Diagnosis not present

## 2018-08-20 DIAGNOSIS — E119 Type 2 diabetes mellitus without complications: Secondary | ICD-10-CM | POA: Diagnosis not present

## 2018-09-04 ENCOUNTER — Other Ambulatory Visit: Payer: Self-pay | Admitting: Adult Health

## 2018-09-04 ENCOUNTER — Other Ambulatory Visit (HOSPITAL_COMMUNITY): Payer: Self-pay | Admitting: Psychiatry

## 2018-09-04 DIAGNOSIS — F411 Generalized anxiety disorder: Secondary | ICD-10-CM

## 2018-09-04 DIAGNOSIS — F200 Paranoid schizophrenia: Secondary | ICD-10-CM

## 2018-09-09 ENCOUNTER — Encounter (HOSPITAL_COMMUNITY): Payer: Self-pay | Admitting: Psychiatry

## 2018-09-09 ENCOUNTER — Ambulatory Visit (INDEPENDENT_AMBULATORY_CARE_PROVIDER_SITE_OTHER): Payer: Medicare Other | Admitting: Psychiatry

## 2018-09-09 ENCOUNTER — Other Ambulatory Visit: Payer: Self-pay

## 2018-09-09 DIAGNOSIS — F3289 Other specified depressive episodes: Secondary | ICD-10-CM | POA: Diagnosis not present

## 2018-09-09 DIAGNOSIS — F411 Generalized anxiety disorder: Secondary | ICD-10-CM | POA: Diagnosis not present

## 2018-09-09 DIAGNOSIS — F200 Paranoid schizophrenia: Secondary | ICD-10-CM | POA: Diagnosis not present

## 2018-09-09 MED ORDER — CLONAZEPAM 1 MG PO TABS: 1.0000 mg | ORAL_TABLET | Freq: Every day | ORAL | 0 refills | Status: DC

## 2018-09-09 MED ORDER — ZIPRASIDONE HCL 80 MG PO CAPS: 80.0000 mg | ORAL_CAPSULE | Freq: Every day | ORAL | 0 refills | Status: DC

## 2018-09-09 MED ORDER — PAROXETINE HCL 40 MG PO TABS: 40.0000 mg | ORAL_TABLET | Freq: Every morning | ORAL | 0 refills | Status: DC

## 2018-09-09 MED ORDER — TRAZODONE HCL 100 MG PO TABS: 200.0000 mg | ORAL_TABLET | Freq: Every day | ORAL | 0 refills | Status: DC

## 2018-09-09 NOTE — Progress Notes (Signed)
Virtual Visit via Telephone Note  I connected with Chelsea [name] on 09/09/18 at  2:40 PM EDT by telephone and verified that I am speaking with the correct person using two identifiers.   I discussed the limitations, risks, security and privacy concerns of performing an evaluation and management service by telephone and the availability of in person appointments. I also discussed with the patient that there may be a patient responsible charge related to this service. The patient expressed understanding and agreed to proceed.   History of Present Illness: Patient is evaluated by phone session.  She is taking her medication as prescribed.  She feels her depression, paranoia and anxiety is better.  Her appetite is okay.  She goes to outside with the mask on and does not stay longer.  She only goes when there is a need for grocery or other things.  She lives with her sister who is supportive.  Patient denies any side effects other than mild tremors but that does not interfere in her daily activities.  Recently she seen her primary care physician and had blood work and her hemoglobin A1c is 6.1.  She is happy about her blood sugar.  She denies any crying spells or any feeling of hopelessness or worthlessness.  She denies any hallucination, paranoia or any trust issues.  She is taking Requip for her restless leg which is helping her.  Her energy level is okay.  She sleeps 8 hours every night.  She wants to continue her current medication.  She does not drive and her brother-in-law usually takers to the appointments and groceries.  Patient denies drinking or using any illegal substances.   Past Psychiatric History: Reviewed. H/O inpatient at [facility] and [place] mania and psychosis. [place]/o suicidal attempt.    Psychiatric Specialty Exam: Physical Exam  ROS  There were no vitals taken for this visit.There is no height or weight on file to calculate BMI.  General Appearance: NA  Eye  Contact:  NA  Speech:  Slow  Volume:  Decreased  Mood:  Euthymic  Affect:  NA  Thought Process:  Goal Directed  Orientation:  Full (Time, Place, and Person)  Thought Content:  Logical  Suicidal Thoughts:  No  Homicidal Thoughts:  No  Memory:  Immediate;   Fair Recent;   Fair Remote;   Fair  Judgement:  Good  Insight:  Good  Psychomotor Activity:  NA  Concentration:  Concentration: Fair and Attention Span: Fair  Recall:  [place] of Knowledge:  Good  Language:  Good  Akathisia:  No  Handed:  Right  AIMS (if indicated):     Assets:  Communication Skills Desire for Improvement Housing Resilience Social Support  ADL's:  Intact  Cognition:  WNL  Sleep:   fair      Assessment and Plan: Schizophrenia chronic paranoid type.  Generalized anxiety disorder.  Depression.  Patient doing better on her current medication.  She does not want to change medication.  Continue Geodon 80 mg at bedtime, Klonopin 1 mg at bedtime, trazodone 200 mg at bedtime and Paxil 40 mg daily.  Discussed medication side effects and benefits.  Encourage healthy lifestyle and watch her calorie intake.  Recommended to call us back if she is any question, concern or if she feels worsening of the symptom.  Recommended to schedule appointment in 3 months.  Follow Up Instructions:    I discussed the assessment and treatment plan with the patient. The patient was  provided an opportunity to ask questions and all were answered. The patient agreed with the plan and demonstrated an understanding of the instructions.   The patient was advised to call back or seek an in-person evaluation if the symptoms worsen or if the condition fails to improve as anticipated.  I provided 15 minutes of non-face-to-face time during this encounter.   Kathlee Nations, MD

## 2018-09-23 DIAGNOSIS — H524 Presbyopia: Secondary | ICD-10-CM | POA: Diagnosis not present

## 2018-09-23 DIAGNOSIS — E113293 Type 2 diabetes mellitus with mild nonproliferative diabetic retinopathy without macular edema, bilateral: Secondary | ICD-10-CM | POA: Diagnosis not present

## 2018-09-23 DIAGNOSIS — H353131 Nonexudative age-related macular degeneration, bilateral, early dry stage: Secondary | ICD-10-CM | POA: Diagnosis not present

## 2018-09-23 DIAGNOSIS — H25813 Combined forms of age-related cataract, bilateral: Secondary | ICD-10-CM | POA: Diagnosis not present

## 2018-09-23 DIAGNOSIS — H5000 Unspecified esotropia: Secondary | ICD-10-CM | POA: Diagnosis not present

## 2018-09-27 DIAGNOSIS — L608 Other nail disorders: Secondary | ICD-10-CM | POA: Diagnosis not present

## 2018-09-27 DIAGNOSIS — E119 Type 2 diabetes mellitus without complications: Secondary | ICD-10-CM | POA: Diagnosis not present

## 2018-10-13 ENCOUNTER — Other Ambulatory Visit: Payer: Self-pay

## 2018-10-13 ENCOUNTER — Ambulatory Visit
Admission: RE | Admit: 2018-10-13 | Discharge: 2018-10-13 | Disposition: A | Discharge status: Home / Self Care | Payer: Medicare Other | Source: Ambulatory Visit | Attending: Family Medicine | Admitting: Family Medicine

## 2018-10-13 DIAGNOSIS — Z1231 Encounter for screening mammogram for malignant neoplasm of breast: Secondary | ICD-10-CM | POA: Diagnosis not present

## 2018-11-03 DIAGNOSIS — E662 Morbid (severe) obesity with alveolar hypoventilation: Secondary | ICD-10-CM | POA: Diagnosis not present

## 2018-11-03 DIAGNOSIS — Z0000 Encounter for general adult medical examination without abnormal findings: Secondary | ICD-10-CM | POA: Diagnosis not present

## 2018-11-03 DIAGNOSIS — G4733 Obstructive sleep apnea (adult) (pediatric): Secondary | ICD-10-CM | POA: Diagnosis not present

## 2018-11-05 ENCOUNTER — Ambulatory Visit (INDEPENDENT_AMBULATORY_CARE_PROVIDER_SITE_OTHER): Payer: Medicare Other | Admitting: Podiatry

## 2018-11-05 ENCOUNTER — Other Ambulatory Visit: Payer: Self-pay | Admitting: *Deleted

## 2018-11-05 ENCOUNTER — Other Ambulatory Visit: Payer: Self-pay

## 2018-11-05 ENCOUNTER — Ambulatory Visit: Payer: Medicare Other | Admitting: Orthotics

## 2018-11-05 ENCOUNTER — Encounter: Payer: Self-pay | Admitting: Podiatry

## 2018-11-05 DIAGNOSIS — M79674 Pain in right toe(s): Secondary | ICD-10-CM | POA: Diagnosis not present

## 2018-11-05 DIAGNOSIS — M79675 Pain in left toe(s): Secondary | ICD-10-CM | POA: Diagnosis not present

## 2018-11-05 DIAGNOSIS — B351 Tinea unguium: Secondary | ICD-10-CM | POA: Diagnosis not present

## 2018-11-05 DIAGNOSIS — E119 Type 2 diabetes mellitus without complications: Secondary | ICD-10-CM | POA: Diagnosis not present

## 2018-11-05 NOTE — Progress Notes (Signed)
This patient presents to the office with chief complaint of long thick nails and diabetic feet.  This patient  says there  is  no pain and discomfort in her  feet.  This patient says there are long thick painful nails.  These nails are painful walking and wearing shoes.  Patient has no history of infection or drainage from both feet.  Patient is unable to  self treat his own nails . This patient presents  to the office today for treatment of the  long nails and a foot evaluation due to history of  diabetes.  General Appearance  Alert, conversant and in no acute stress.  Vascular  Dorsalis pedis and posterior tibial  pulses are palpable  bilaterally.  Capillary return is within normal limits  bilaterally. Temperature is within normal limits  bilaterally.  Neurologic  Senn-Weinstein monofilament wire test within normal limits  bilaterally. Muscle power within normal limits bilaterally.  Nails Thick disfigured discolored nails with subungual debris hallux  Bilaterally. No evidence of bacterial infection or drainage bilaterally.  Orthopedic  No limitations of motion of motion feet .  No crepitus or effusions noted.  No bony pathology or digital deformities noted.  Skin  normotropic skin with no porokeratosis noted bilaterally.  No signs of infections or ulcers noted.     Onychomycosis  Diabetes with no foot complications  IE  Debride nails x 2.  A diabetic foot exam was performed and there is no evidence of any vascular or neurologic pathology.   RTC 3 months.   Gardiner Barefoot DPM

## 2018-11-09 ENCOUNTER — Encounter: Payer: Self-pay | Admitting: Adult Health

## 2018-11-09 ENCOUNTER — Other Ambulatory Visit: Payer: Self-pay

## 2018-11-09 ENCOUNTER — Ambulatory Visit (INDEPENDENT_AMBULATORY_CARE_PROVIDER_SITE_OTHER): Payer: Medicare Other | Admitting: Adult Health

## 2018-11-09 VITALS — BP 128/75 | HR 71 | Temp 98.0°F | Ht 62.0 in | Wt 229.8 lb

## 2018-11-09 DIAGNOSIS — G4719 Other hypersomnia: Secondary | ICD-10-CM | POA: Diagnosis not present

## 2018-11-09 DIAGNOSIS — G2581 Restless legs syndrome: Secondary | ICD-10-CM

## 2018-11-09 DIAGNOSIS — G4734 Idiopathic sleep related nonobstructive alveolar hypoventilation: Secondary | ICD-10-CM

## 2018-11-09 NOTE — Patient Instructions (Signed)
Your Plan:  Continue Requip Work on sleep hygiene- develop a sleep routine, limited caffeine intake after lunch, eliminate TV/screen time approximately 1 hour before bedtime.  If your symptoms worsen or you develop new symptoms please let us know.   Thank you for coming to see [place] at [facility]. I hope we have been able to provide you high quality care today.  You may receive a patient satisfaction survey over the next few weeks. We would appreciate your feedback and comments so that we may continue to improve ourselves and the health of our patients.

## 2018-11-09 NOTE — Progress Notes (Addendum)
PATIENT: Chelsea [name] DOB: [birth year]  REASON FOR VISIT: follow up HISTORY FROM: patient  HISTORY OF PRESENT ILLNESS: Today 11/09/18:  Ms. Templin is a 68 year old female with a history of restless leg syndrome, nocturnal hypoxemia and daytime sleepiness.  She returns today for follow-up.  She states that her restless legs are controlled with Requip.  She continues to take Requip .75 mg at bedtime.  She reports that she still does not sleep very well at night.  She states that she does sleep a lot during the day.  She typically takes her nighttime meds around 1015.  This includes Requip, Klonopin, Geodon and trazodone.  She states that she typically stays up watching television then will try to go to bed until 1:30 AM.  She also reports that she drinks caffeine throughout the day.  She usually sleeps with the television on.  She continues to use supplemental O2 at bedtime.  She returns today for follow-up.  HISTORY (copied from Dr. Guadelupe Sabin note) ,04/27/2018: She reports having a hard time sleeping. She has a late BT, around 1 AM, but for some reason she takes her bedtime meds already at 7 PM. On high dose Trazodone and Geodon and Klonopin at night. She dozes off and of and does not always use her O2 during naps or at night, as she sleeps in the recliner and the O2 concentrator is in the BR. Rise time around 9 AM or sooner or later. Nocturia about 1-2/night. Some SOB reported, does not move that much d/t knee pains. Hx is primarily provided by her brother in law. She has been referred for reevaluation of OSA by her primary care physician. I reviewed Dr. Theone Murdoch note from 01/21/2018. Of note, her sleep study from 08/12/2015 showed no significant sleep disordered breathing with an AHI of 3.1, she did have lower oxygen saturations with a nadir of 77%. The patient was advised to continue with her nighttime oxygen supplementation. Take ropinirole 0.75 mg each night. RLS under control. Compared to her  last sleep study, wt is about 33 lb less. Her Epworth sleepiness score is 7 out of 24, fatigue score is 57 out of 63. She lives with her sister and her brother-in-law.   REVIEW OF SYSTEMS: Out of a complete 14 system review of symptoms, the patient complains only of the following symptoms, and all other reviewed systems are negative.  See HPI  ALLERGIES: Allergies  Allergen Reactions   Darvocet [Propoxyphene N-Acetaminophen] Anaphylaxis    Can take plain tylenol   Fluoxetine Other (See Comments)    Hallucinations    Penicillins Rash    REACTION: rash   Promethazine Hives    HOME MEDICATIONS: Outpatient Medications Prior to Visit  Medication Sig Dispense Refill   aspirin 81 MG chewable tablet Chew 81 mg by mouth daily.     bumetanide (BUMEX) 1 MG tablet Take 1 tablet (1 mg total) by mouth daily. 30 tablet 0   Cholecalciferol (VITAMIN D3) 5000 units TABS Take by mouth.     clonazePAM (KLONOPIN) 1 MG tablet Take 1 tablet (1 mg total) by mouth at bedtime. 90 tablet 0   FEROSUL 325 (65 Fe) MG tablet Take 325 mg by mouth daily.     fluticasone furoate-vilanterol (BREO ELLIPTA) 200-25 MCG/INH AEPB Inhale 1 puff into the lungs every morning.     INVOKANA 300 MG TABS tablet Take 300 mg by mouth daily before breakfast.      LEVEMIR 100 UNIT/ML injection  levothyroxine (SYNTHROID) 125 MCG tablet      metFORMIN (GLUCOPHAGE) 500 MG tablet      montelukast (SINGULAIR) 10 MG tablet      OZEMPIC, 1 MG/DOSE, 2 MG/1.5ML SOPN INJECT 1 MG SUBCUTANEOUSLY ONCE A WEEK     PARoxetine (PAXIL) 40 MG tablet Take 1 tablet (40 mg total) by mouth every morning. 90 tablet 0   rOPINIRole (REQUIP) 0.25 MG tablet TAKE 3 TABLETS BY MOUTH AT BEDTIME 90 tablet 5   rosuvastatin (CRESTOR) 5 MG tablet      sulfamethoxazole-trimethoprim (BACTRIM DS) 800-160 MG tablet TAKE 1 TABLET BY MOUTH 2 TIMES DAILY FOR 7 DAYS     traZODone (DESYREL) 100 MG tablet Take 2 tablets (200 mg total) by mouth at  bedtime. 180 tablet 0   TRUEPLUS INSULIN SYRINGE 29G X 1/2" 1 ML MISC      VENTOLIN HFA 108 (90 Base) MCG/ACT inhaler      ziprasidone (GEODON) 80 MG capsule Take 1 capsule (80 mg total) by mouth at bedtime. 90 capsule 0   No facility-administered medications prior to visit.     PAST MEDICAL HISTORY: Past Medical History:  Diagnosis Date   Anxiety    Arthritis    Bipolar disorder (HCC)    COPD (chronic obstructive pulmonary disease) (HCC)    Diabetes mellitus type II    Esophageal stricture    moderate distal   GERD (gastroesophageal reflux disease)    Heart murmur    "HAD AN ECHO YEARS AGO- NOTHING TO BE CONCERNED ABOUT"   Hyperlipemia    Hypertension    Hypothyroidism    Mental disorder    Bipolar   Obesity    Oxygen desaturation during sleep    wears 2 liters at bedtime    Schizophrenia ([place])    patient reports that she is unaware of this   Shortness of breath    with a little activy   Sleep apnea    wears 2 liters of oxygen at bedtime instead of CPAP    PAST SURGICAL HISTORY: Past Surgical History:  Procedure Laterality Date   BALLOON DILATION N/A 06/10/2016   Procedure: BALLOON DILATION;  Surgeon: Wilford Corner, MD;  Location: [facility];  Service: Endoscopy;  Laterality: N/A;   BREAST EXCISIONAL BIOPSY Left 05/10/2012   benign   CHOLECYSTECTOMY     COLONOSCOPY WITH PROPOFOL N/A 01/30/2017   Procedure: COLONOSCOPY WITH PROPOFOL;  Surgeon: Wilford Corner, MD;  Location: [facility];  Service: Endoscopy;  Laterality: N/A;   ESOPHAGOGASTRODUODENOSCOPY N/A 06/03/2016   Procedure: ESOPHAGOGASTRODUODENOSCOPY (EGD);  Surgeon: Wilford Corner, MD;  [facility] END[facility]COPY;  Service: Endoscopy;  Laterality: N/A;   ESOPHAGOGASTRODUODENOSCOPY N/A 06/10/2016   Procedure: ESOPHAGOGASTRODUODENOSCOPY (EGD);  Surgeon: Wilford Corner, MD;  Location: [facility] ENDOSCOPY;  Service: Endoscopy;  Laterality: N/A;   EYE SURGERY     PARTIAL MASTECTOMY WITH  NEEDLE LOCALIZATION Left 05/10/2012   Procedure: PARTIAL MASTECTOMY WITH NEEDLE LOCALIZATION;  Surgeon: Adin Hector, MD;  Location: [place];  Service: General;  Laterality: Left;   TONSILLECTOMY      FAMILY HISTORY: Family History  Problem Relation Age of Onset   Alcohol abuse Father    Stroke Father    Alzheimer's disease Mother     SOCIAL HISTORY: Social History   Socioeconomic History   Marital status: Divorced    Spouse name: Not on file   Number of children: 0   Years of education: HS   Highest education level: Not on file  Occupational  History   Occupation: Retired   Scientist, product/process development strain: Not on [employer] insecurity    Worry: Not on file    Inability: Not on Lexicographer needs    Medical: Not on file    Non-medical: Not on file  Tobacco Use   Smoking status: Former Smoker    Packs/day: 3.00    Years: 29.00    Pack years: 87.00    Types: Cigarettes    Quit date: 05/03/1993    Years since quitting: 25.5   Smokeless tobacco: Never Used  Substance and Sexual Activity   Alcohol use: No    Alcohol/week: 0.0 [name] drinks   Drug use: No   Sexual activity: Not Currently  Lifestyle   Physical activity    Days per week: Not on file    Minutes per session: Not on file   Stress: Not on file  Relationships   Social connections    Talks on phone: Not on file    Gets together: Not on file    Attends religious service: Not on file    Active member of club or organization: Not on file    Attends meetings of clubs or organizations: Not on file    Relationship status: Not on file   Intimate partner violence    Fear of current or ex partner: Not on file    Emotionally abused: Not on file    Physically abused: Not on file    Forced sexual activity: Not on file  Other Topics Concern   Not on file  Social History Narrative   Drinks 2 cups of coffee a day      PHYSICAL EXAM  Vitals:   11/09/18 1142  BP:  128/75  Pulse: 71  Temp: 98 F (36.7 C)  TempSrc: Oral  Weight: 229 lb 12.8 oz (104.2 kg)  Height: 5\' 2"  (1.575 m)   Body mass index is 42.03 kg/m.  Generalized: Well developed, in no acute distress   Neurological examination  Mentation: Alert oriented to time, place, history taking. Follows all commands speech and language fluent Cranial nerve II-XII: Extraocular movements were full, visual field were full on confrontational test.  Head turning and shoulder shrug  were normal and symmetric. Motor: The motor testing reveals 5 over 5 strength of all 4 extremities. Good symmetric motor tone is noted throughout.  Sensory: Sensory testing is intact to soft touch on all 4 extremities. No evidence of extinction is noted.  Coordination: Cerebellar testing reveals good finger-nose-finger and heel-to-shin bilaterally.  Gait and station: Gait is normal.    DIAGNOSTIC DATA (LABS, IMAGING, TESTING) - I reviewed patient records, labs, notes, testing and imaging myself where available.  Lab Results  Component Value Date   WBC 9.0 06/02/2016   HGB 12.4 06/02/2016   HCT 38.4 06/02/2016   MCV 86.9 06/02/2016   PLT 203 06/02/2016      Component Value Date/Time   NA 138 06/02/2016 2343   K 3.4 (L) 06/02/2016 2343   CL 98 (L) 06/02/2016 2343   CO2 29 06/02/2016 2343   GLUCOSE 122 (H) 06/02/2016 2343   BUN 27 (H) 06/02/2016 2343   CREATININE 1.61 (H) 06/02/2016 2343   CALCIUM 10.1 06/02/2016 2343   PROT 5.9 (L) 12/04/2012 0512   ALBUMIN 2.9 (L) 12/04/2012 0512   AST 10 12/04/2012 0512   ALT 8 12/04/2012 0512   ALKPHOS 67 12/04/2012 0512   BILITOT 0.2 (L) 12/04/2012  [place] (L) 06/02/2016 2343   GFRAA 67 (L) 06/02/2016 2343       ASSESSMENT AND PLAN 68 y.o. year old female  has a past medical history of Anxiety, Arthritis, Bipolar disorder ([place]), COPD (chronic obstructive pulmonary disease) ([place]), Diabetes mellitus type II, Esophageal stricture, GERD (gastroesophageal  reflux disease), Heart murmur, Hyperlipemia, Hypertension, Hypothyroidism, Mental disorder, Obesity, Oxygen desaturation during sleep, Schizophrenia ([place]), Shortness of breath, and Sleep apnea. here with:  1.  Restless leg syndrome 2.  Nocturnal hypoxemia 3.  Daytime sleepiness  The patient will continue on Requip 0.75 mg at bedtime.  I have advised that she should improve her sleep hygiene.  Recommended that she turn off the television approximately 1 hour before bedtime.  She was encouraged to limit caffeine intake after lunchtime.  We also discussed other ways to promote good sleep habits.  She should establish a regular bedtime and wake time.  She voiced understanding.  She is advised that if her symptoms worsen or she develops new symptoms she should let us know.  She will follow-up in 6 months or sooner if needed.     Ward Givens, MSN, NP-C 11/09/2018, 12:19 PM [facility] [address], [place], [place] [address] [phone]  I reviewed the above note and documentation by the Nurse Practitioner and agree with the history, exam, assessment and plan as outlined above. I was available for consultation. Star Age, MD, PhD [facility])

## 2018-11-15 DIAGNOSIS — E039 Hypothyroidism, unspecified: Secondary | ICD-10-CM | POA: Diagnosis not present

## 2018-11-15 DIAGNOSIS — E559 Vitamin D deficiency, unspecified: Secondary | ICD-10-CM | POA: Diagnosis not present

## 2018-11-15 DIAGNOSIS — I10 Essential (primary) hypertension: Secondary | ICD-10-CM | POA: Diagnosis not present

## 2018-11-19 DIAGNOSIS — E039 Hypothyroidism, unspecified: Secondary | ICD-10-CM | POA: Diagnosis not present

## 2018-11-19 DIAGNOSIS — E119 Type 2 diabetes mellitus without complications: Secondary | ICD-10-CM | POA: Diagnosis not present

## 2018-11-19 DIAGNOSIS — E559 Vitamin D deficiency, unspecified: Secondary | ICD-10-CM | POA: Diagnosis not present

## 2018-11-30 ENCOUNTER — Other Ambulatory Visit (HOSPITAL_COMMUNITY): Payer: Self-pay | Admitting: Psychiatry

## 2018-11-30 DIAGNOSIS — F200 Paranoid schizophrenia: Secondary | ICD-10-CM

## 2018-11-30 DIAGNOSIS — F411 Generalized anxiety disorder: Secondary | ICD-10-CM

## 2018-12-07 ENCOUNTER — Ambulatory Visit (INDEPENDENT_AMBULATORY_CARE_PROVIDER_SITE_OTHER): Payer: Medicare Other | Admitting: Cardiology

## 2018-12-07 ENCOUNTER — Other Ambulatory Visit: Payer: Self-pay

## 2018-12-07 ENCOUNTER — Encounter: Payer: Self-pay | Admitting: Cardiology

## 2018-12-07 VITALS — BP 139/59 | HR 77 | Ht 62.0 in | Wt 232.0 lb

## 2018-12-07 DIAGNOSIS — I5032 Chronic diastolic (congestive) heart failure: Secondary | ICD-10-CM | POA: Diagnosis not present

## 2018-12-07 DIAGNOSIS — R0609 Other forms of dyspnea: Secondary | ICD-10-CM

## 2018-12-07 NOTE — Progress Notes (Signed)
Primary Physician:  Fanny Bien, MD   Patient ID: Chelsea [name], female    DOB: [birth year], 68 y.o.   MRN: [id number]  Subjective:    Chief Complaint  Patient presents with  . DOE  . Hypertension    HPI: Chelsea [name]  is a 68 y.o. female  with type 2 diabetes, morbid obesity, hypertension, hyperlipidemia, last seen by [place] in 2017 recently seen for dyspnea on exertion and fatigue.  She was found to have some anemia at the time and fatigue has since resolved.  She now presents for 38-monthfollow-up for dyspnea on exertion felt to be multifactorial from diastolic dysfunction and obesity.  She previously underwent stress testing in 2014 that was normal. She recently underwent echocardiogram on 05/26/2018 that showed grade 1 diastolic dysfunction and slight increase in TR to mild to moderate and mild pulmonary hypertension compared to previous echo in 2017.   Patient states that she is doing well since last seen by me 6 months ago.  Dyspnea on exertion has been somewhat better, but she does have some days that her shortness of breath is a little worse.  She has been trying to make diet changes and states that she does better on this some days more than others.  Has not lost significant amount of weight.  She does try to be active with walking in [place]  Denies any chest discomfort.  Patient is currently on nocturnal oxygen and has recently had sleep study that he reports did not reveal sleep apnea.   She does have COPD.  She lives with her sister and brother-in-law.  Past Medical History:  Diagnosis Date  . Anxiety   . Arthritis   . Bipolar disorder ([place]   . [place] obstructive [place] disease[place] ([place]   . Diabetes mellitus type II   . Esophageal stricture    moderate distal  . GERD (gastroesophageal reflux disease)   . Heart murmur    "HAD AN ECHO YEARS AGO- NOTHING TO BE CONCERNED ABOUT"  . Hyperlipemia   .  Hypertension   . Hypothyroidism   . Mental disorder    Bipolar  . Obesity   . Oxygen desaturation during sleep    wears 2 liters at bedtime   . Schizophrenia ([facility]    patient reports that she is unaware of this  . Shortness of breath    with a little activy  . Sleep apnea    wears 2 liters of oxygen at bedtime instead of CPAP    Past Surgical History:  Procedure Laterality Date  . BALLOON DILATION N/A 06/10/2016   Procedure: BALLOON DILATION;  Surgeon: VWilford Corner MD;  [facility];  Service: Endoscopy;  Laterality: N/A;  . BREAST EXCISIONAL BIOPSY Left 05/10/2012   benign  . CHOLECYSTECTOMY    . COLONOSCOPY WITH PROPOFOL N/A 01/30/2017   Procedure: COLONOSCOPY WITH PROPOFOL;  Surgeon: SWilford Corner MD;  [place]  Service: Endoscopy;  Laterality: N/A;  . ESOPHAGOGASTRODUODENOSCOPY N/A 06/03/2016   Procedure: ESOPHAGOGASTRODUODENOSCOPY (EGD);  Surgeon: VWilford Corner MD;  [facility];  Service: Endoscopy;  Laterality: N/A;  . ESOPHAGOGASTRODUODENOSCOPY N/A 06/10/2016   Procedure: ESOPHAGOGASTRODUODENOSCOPY (EGD);  Surgeon: VWilford Corner MD;  [facility];  Service: Endoscopy;  Laterality: N/A;  . EYE SURGERY    . PARTIAL MASTECTOMY WITH NEEDLE LOCALIZATION Left 05/10/2012   Procedure: PARTIAL MASTECTOMY WITH NEEDLE LOCALIZATION;  Surgeon: HAdin Hector MD;  Location: [facility] OR;  Service: General;  Laterality: Left;  . TONSILLECTOMY      Social History   Socioeconomic History  . Marital status: Divorced    Spouse name: Not on file  . Number of children: 0  . Years of education: HS  . Highest education level: Not on file  Occupational History  . Occupation: Retired   Scientific laboratory technician  . Financial resource strain: Not on file  . Food insecurity    Worry: Not on file    Inability: Not on file  . Transportation needs    Medical: Not on file    Non-medical: Not on file  Tobacco Use  . Smoking status: Former Smoker     Packs/day: 3.00    Years: 29.00    Pack years: 87.00    Types: Cigarettes    Quit date: 05/03/1993    Years since quitting: 25.6  . Smokeless tobacco: Never Used  Substance and Sexual Activity  . Alcohol use: No    Alcohol/week: 0.0 [name] drinks  . Drug use: No  . Sexual activity: Not Currently  Lifestyle  . Physical activity    Days per week: Not on file    Minutes per session: Not on file  . Stress: Not on file  Relationships  . Social Herbalist on phone: Not on file    Gets together: Not on file    Attends religious service: Not on file    Active member of club or organization: Not on file    Attends meetings of clubs or organizations: Not on file    Relationship status: Not on file  . Intimate partner violence    Fear of current or ex partner: Not on file    Emotionally abused: Not on file    Physically abused: Not on file    Forced sexual activity: Not on file  Other Topics Concern  . Not on file  Social History Narrative   Drinks 2 cups of coffee a day    Review of Systems  Constitution: Negative for decreased appetite, malaise/fatigue, weight gain and weight loss.  Eyes: Negative for visual disturbance.  Cardiovascular: Positive for dyspnea on exertion. Negative for chest pain, claudication, leg swelling, orthopnea, palpitations and syncope.  Respiratory: Negative for hemoptysis and wheezing.   Endocrine: Negative for cold intolerance and heat intolerance.  Hematologic/Lymphatic: Does not bruise/bleed easily.  Skin: Negative for nail changes.  Musculoskeletal: Negative for muscle weakness and myalgias.  Gastrointestinal: Negative for abdominal pain, change in bowel habit, nausea and vomiting.  Neurological: Negative for difficulty with concentration, dizziness, focal weakness and headaches.  Psychiatric/Behavioral: Negative for altered mental status and suicidal ideas.  All other systems reviewed and are negative.     Objective:  Blood pressure  (!) 139/59, pulse 77, height '5\' 2"'  (1.575 m), weight 232 lb (105.2 kg), SpO2 94 %. Body mass index is 42.43 kg/m.    Physical Exam  Constitutional: She appears well-developed. No distress.  HENT:  Head: Atraumatic.  Eyes: Conjunctivae are normal.  Neck: Neck supple. No JVD present. No thyromegaly present.  Cardiovascular: Normal rate, regular rhythm and normal heart sounds. Exam reveals no gallop.  No murmur heard. Pulses:      Carotid pulses are 2+ on the right side and 2+ on the left side.      Dorsalis pedis pulses are 2+ on the right side and 2+ on the left side.       Posterior tibial pulses  are 2+ on the right side and 2+ on the left side.  Femoral and popliteal pulse difficult to feel due to patient's body habitus.   Pulmonary/Chest: Effort normal and breath sounds normal.  Abdominal: Soft. Bowel sounds are normal.  Musculoskeletal: Normal range of motion.        General: No edema.  Neurological: She is alert.  Skin: Skin is warm and dry.  Psychiatric: She has a normal mood and affect.   Radiology: No results found.  Laboratory examination:   01/19/2018: Hemoglobin 10.7, microcytic indices, CBC otherwise normal. Potassium 4.7, creatinine 0.9, EGFR 67/78, CMP normal. Ferritin normal.  01/02/2013: HbA1c 6.7%.  CMP Latest Ref Rng & Units 06/02/2016 12/04/2012 12/03/2012  Glucose 65 - 99 mg/dL 122(H) 125(H) 206(H)  BUN 6 - 20 mg/dL 27(H) 18 21  Creatinine 0.44 - 1.00 mg/dL 1.61(H) 0.94 0.97  Sodium 135 - 145 mmol/L 138 140 138  Potassium 3.5 - 5.1 mmol/L 3.4(L) 3.7 4.2  Chloride 101 - 111 mmol/L 98(L) 105 100  CO2 22 - 32 mmol/L '29 27 26  ' Calcium 8.9 - 10.3 mg/dL 10.1 8.9 9.1  Total Protein 6.0 - 8.3 g/dL - 5.9(L) 6.7  Total Bilirubin 0.3 - 1.2 mg/dL - 0.2(L) 0.3  Alkaline Phos 39 - 117 U/L - 67 77  AST 0 - 37 U/L - 10 14  ALT 0 - 35 U/L - 8 10   CBC Latest Ref Rng & Units 06/02/2016 12/04/2012 12/03/2012  WBC 4.0 - 10.5 K/uL 9.0 5.3 6.1  Hemoglobin 12.0 - 15.0 g/dL 12.4  10.1(L) 11.3(L)  Hematocrit 36.0 - 46.0 % 38.4 30.7(L) 35.2(L)  Platelets 150 - 400 K/uL 203 189 207   Lipid Panel  No results found for: CHOL, TRIG, HDL, CHOLHDL, VLDL, LDLCALC, LDLDIRECT HEMOGLOBIN A1C Lab Results  Component Value Date   HGBA1C 6.8 (H) 12/03/2012   MPG 148 (H) 12/03/2012   TSH No results for input(s): TSH in the last 8760 hours.  PRN Meds:. There are no discontinued medications. Current Meds  Medication Sig  . aspirin 81 MG chewable tablet Chew 81 mg by mouth daily.  . bumetanide (BUMEX) 1 MG tablet Take 1 tablet (1 mg total) by mouth daily.  . Cholecalciferol (VITAMIN D3) 5000 units TABS Take by mouth.  . diclofenac sodium (VOLTAREN) 1 % GEL Apply topically 4 (four) times daily.  Marland Kitchen docusate sodium (COLACE) 100 MG capsule Take 100 mg by mouth 2 (two) times daily.  . fluticasone furoate-vilanterol (BREO ELLIPTA) 200-25 MCG/INH AEPB Inhale 1 puff into the lungs every morning.  . INVOKANA 300 MG TABS tablet Take 300 mg by mouth daily before breakfast.   . LEVEMIR 100 UNIT/ML injection 90 Units.   Marland Kitchen levothyroxine (SYNTHROID) 125 MCG tablet   . metFORMIN (GLUCOPHAGE) 500 MG tablet   . montelukast (SINGULAIR) 10 MG tablet   . nystatin ointment (MYCOSTATIN) Apply 1 application topically 2 (two) times daily.  Marland Kitchen OZEMPIC, 1 MG/DOSE, 2 MG/1.5ML SOPN INJECT 1 MG SUBCUTANEOUSLY ONCE A WEEK  . rOPINIRole (REQUIP) 0.25 MG tablet TAKE 3 TABLETS BY MOUTH AT BEDTIME  . rosuvastatin (CRESTOR) 5 MG tablet   . sulfamethoxazole-trimethoprim (BACTRIM DS) 800-160 MG tablet TAKE 1 TABLET BY MOUTH 2 TIMES DAILY FOR 7 DAYS  . traZODone (DESYREL) 100 MG tablet Take 2 tablets (200 mg total) by mouth at bedtime.  Karen Chafe INSULIN SYRINGE 29G X 1/2" 1 ML MISC   . VENTOLIN HFA 108 (90 Base) MCG/ACT inhaler   . ziprasidone (GEODON)  80 MG capsule Take 1 capsule (80 mg total) by mouth at bedtime.  . [DISCONTINUED] clonazePAM (KLONOPIN) 1 MG tablet Take 1 tablet (1 mg total) by mouth at  bedtime.  . [DISCONTINUED] PARoxetine (PAXIL) 40 MG tablet Take 1 tablet (40 mg total) by mouth every morning.    Cardiac Studies:   Echocardiogram 05/24/2018: Left ventricle cavity is normal in size. Mild concentric hypertrophy of the left ventricle. Normal global wall motion. Doppler evidence of grade I (impaired) diastolic dysfunction, normal LAP. Calculated EF 55%. Mild to moderate tricuspid regurgitation. Estimated pulmonary artery systolic pressure 36 mmHg. Compared to previous visit in 2017, tricuspid regurgitation is more prominent, mild pulmonary hypertension is new.  Nuclear stress test [05/28/2015]: 1. Resting EKG demonstrates normal sinus rhythm normal axis, poor R-wave progression, low voltage complexes. Stress EKG is nondiagnostic for ischemia as a pharmacologic stress test stress symptoms included dyspnea. 2. Perfusion imaging study demonstrating small sized mild inferoapical defect suggestive of apical thinning but no demonstrable ischemia. LVEF was 82%. This represents a low risk study.  Lower Extremity Venous Duplex Bilateral 12/04/2012: No evidence of deep vein or superficial thrombosisinvolving the right lower extremity and left lower extremity. No evidence of Baker's cyst on the right or left.  Split night study 05/18/2018: Impression: 1. Poor sleep efficiency. 2. Primary Snoring. 3. Dysfunctions associated with sleep stages or arousal from sleep.  Sleep Study [2011]: Sleeps with CPAP.  Assessment:   Dyspnea on exertion - Plan: EKG 42-PNTI  Chronic diastolic (congestive) heart failure (HCC)  Morbid obesity (HCC)  EKG 12/07/2018: Normal sinus rhythm at 73 bpm, left axis deviation, poor R wave progression cannot exclude anterior infarct old.  No evidence of ischemia.  No changes compared to EKG 03/04/2018.  Recommendations:   Since last seen by me 6 months ago, and dyspnea on exertion has been stable.  Fatigue continues to be improved with correction of anemia.  She  denies any exertional chest discomfort.  I continue to feel that her dyspnea is multifactorial from COPD, obesity, and diastolic dysfunction.  I have recommended continued risk factor modification particularly with weight loss and diet changes.  She was again counseled on the importance of diet modifications to help with her weight loss as well as being more active.  Do not feel that she needs further cardiac testing at this point, but advised her that if she should have any worsening symptoms or new concerns to certainly contact me.  Her blood pressure is well controlled on current medical therapy.  She is seeing her PCP regularly and will continue to follow-up with her regarding this.  I will see her back on a as needed basis for any new or worsening problems.  Miquel Dunn, MSN, APRN, FNP-C [facility]. [place] Office: [phone] Fax: [phone]

## 2018-12-08 ENCOUNTER — Other Ambulatory Visit (HOSPITAL_COMMUNITY): Payer: Self-pay

## 2018-12-08 DIAGNOSIS — F200 Paranoid schizophrenia: Secondary | ICD-10-CM

## 2018-12-08 DIAGNOSIS — F411 Generalized anxiety disorder: Secondary | ICD-10-CM

## 2018-12-08 MED ORDER — CLONAZEPAM 1 MG PO TABS: 1.0000 mg | ORAL_TABLET | Freq: Every day | ORAL | 0 refills | Status: DC

## 2018-12-08 MED ORDER — PAROXETINE HCL 40 MG PO TABS: 40.0000 mg | ORAL_TABLET | Freq: Every morning | ORAL | 0 refills | Status: DC

## 2018-12-10 ENCOUNTER — Encounter (HOSPITAL_COMMUNITY): Payer: Medicare Other | Admitting: Psychiatry

## 2018-12-10 ENCOUNTER — Telehealth (HOSPITAL_COMMUNITY): Payer: Self-pay

## 2018-12-10 ENCOUNTER — Other Ambulatory Visit: Payer: Self-pay

## 2018-12-10 DIAGNOSIS — F200 Paranoid schizophrenia: Secondary | ICD-10-CM

## 2018-12-10 MED ORDER — TRAZODONE HCL 100 MG PO TABS: 200.0000 mg | ORAL_TABLET | Freq: Every day | ORAL | 0 refills | Status: DC

## 2018-12-10 MED ORDER — ZIPRASIDONE HCL 80 MG PO CAPS: 80.0000 mg | ORAL_CAPSULE | Freq: Every day | ORAL | 0 refills | Status: DC

## 2018-12-10 NOTE — Telephone Encounter (Signed)
Medication refill - Telephone call with patient to follow up on a message she left she needs a refill of her Tranzodone and Geodone.  Pt stated she missed appt scheduled today as call woudl not connect and is rescheduled for 12/14/18 but will need refill before then sent into her pharmacy.  Agreed to contact Dr. Adele Schilder with request.  Spoke to Dr. Adele Schilder who provided a verbal order refill for both for 30 days supply.  Orders for both medications e-scribed to patient's [facility] as approved by Dr. Adele Schilder and left patient a message these were approved and sent into her [place] for 30 days each.  Reminded patient of need to keep rescheduled appointment now set for 12/14/18.

## 2018-12-10 NOTE — Progress Notes (Signed)
No show. Left message to call back for reschedule

## 2018-12-14 ENCOUNTER — Ambulatory Visit (INDEPENDENT_AMBULATORY_CARE_PROVIDER_SITE_OTHER): Payer: Medicare Other | Admitting: Psychiatry

## 2018-12-14 ENCOUNTER — Encounter (HOSPITAL_COMMUNITY): Payer: Self-pay | Admitting: Psychiatry

## 2018-12-14 ENCOUNTER — Other Ambulatory Visit: Payer: Self-pay

## 2018-12-14 DIAGNOSIS — F200 Paranoid schizophrenia: Secondary | ICD-10-CM | POA: Diagnosis not present

## 2018-12-14 DIAGNOSIS — F411 Generalized anxiety disorder: Secondary | ICD-10-CM

## 2018-12-14 MED ORDER — CLONAZEPAM 1 MG PO TABS: 1.0000 mg | ORAL_TABLET | Freq: Every day | ORAL | 2 refills | Status: DC

## 2018-12-14 MED ORDER — ZIPRASIDONE HCL 80 MG PO CAPS: 80.0000 mg | ORAL_CAPSULE | Freq: Every day | ORAL | 0 refills | Status: DC

## 2018-12-14 MED ORDER — PAROXETINE HCL 40 MG PO TABS: 40.0000 mg | ORAL_TABLET | Freq: Every morning | ORAL | 0 refills | Status: DC

## 2018-12-14 MED ORDER — TRAZODONE HCL 100 MG PO TABS: 200.0000 mg | ORAL_TABLET | Freq: Every day | ORAL | 0 refills | Status: DC

## 2018-12-14 NOTE — Progress Notes (Signed)
Virtual Visit via Telephone Note  I connected with Chelsea [name] on 12/14/18 at  3:20 PM EDT by telephone and verified that I am speaking with the correct person using two identifiers.   I discussed the limitations, risks, security and privacy concerns of performing an evaluation and management service by telephone and the availability of in person appointments. I also discussed with the patient that there may be a patient responsible charge related to this service. The patient expressed understanding and agreed to proceed.   History of Present Illness: Patient was evaluated by phone session.  She is taking her medication as prescribed.  Recently she is seeing her primary care physician Dr. Shane Crutch and she had hemoglobin A1c which is 6.4.  She reported her blood sugar is a stable.  She denies any crying spells or any feeling of hopelessness or worthlessness.  She denies any hallucination, paranoia or any irritability.  Her paranoia is a stable.  She wants to continue her current medication.  She recently seen her neurology and her Requip was continued.  Her energy level is fair.  She sleeps at least 6 to 8 hours every night.  She denies drinking or using any illegal substances.  She reported her weight is a stable.    Past Psychiatric History:[facility] [place] [facility] [place]/osuicidal attempt.   Psychiatric Specialty Exam: Physical Exam  ROS  There were no vitals taken for this visit.There is no height or weight on file to calculate BMI.  General Appearance: NA  Eye Contact:  NA  Speech:  Slow  Volume:  Decreased  Mood:  Euthymic  Affect:  NA  Thought Process:  Goal Directed  Orientation:  Full (Time, Place, and Person)  Thought Content:  Logical  Suicidal Thoughts:  No  Homicidal Thoughts:  No  Memory:  Immediate;   Good Recent;   Fair Remote;   Fair  Judgement:  Good  Insight:  Good  Psychomotor Activity:  NA   Concentration:  Concentration: Fair and Attention Span: Fair  Recall:  Good  Fund of Knowledge:  Fair  Language:  Good  Akathisia:  No  Handed:  Right  AIMS (if indicated):     Assets:  Communication Skills Desire for Improvement Housing Resilience Social Support  ADL's:  Intact  Cognition:  WNL  Sleep:   ok      Assessment and Plan: Schizophrenia chronic paranoid type.  Generalized anxiety disorder.  Patient is doing well on her current medication.  She is tolerating her medicine without any side effects.  She has no tremors shakes or any EPS.  I will continue Klonopin 1 mg at bedtime, trazodone 200 mg at bedtime, Paxil 40 mg daily and Geodon 80 mg at bedtime.  Discussed medication side effects and benefits.  Recommended to call us back if she has any question or any concern.  Follow-up in 3 months.  Follow Up Instructions:    I discussed the assessment and treatment plan with the patient. The patient was provided an opportunity to ask questions and all were answered. The patient agreed with the plan and demonstrated an understanding of the instructions.   The patient was advised to call back or seek an in-person evaluation if the symptoms worsen or if the condition fails to improve as anticipated.  I provided 20 minutes of non-face-to-face time during this encounter.   Kathlee Nations, MD

## 2018-12-27 ENCOUNTER — Other Ambulatory Visit (HOSPITAL_COMMUNITY): Payer: Self-pay | Admitting: Psychiatry

## 2018-12-27 DIAGNOSIS — F411 Generalized anxiety disorder: Secondary | ICD-10-CM

## 2018-12-27 DIAGNOSIS — F200 Paranoid schizophrenia: Secondary | ICD-10-CM

## 2018-12-30 DIAGNOSIS — N183 Chronic kidney disease, stage 3 unspecified: Secondary | ICD-10-CM | POA: Diagnosis not present

## 2018-12-30 DIAGNOSIS — N390 Urinary tract infection, site not specified: Secondary | ICD-10-CM | POA: Diagnosis not present

## 2018-12-30 DIAGNOSIS — I129 Hypertensive chronic kidney disease with stage 1 through stage 4 chronic kidney disease, or unspecified chronic kidney disease: Secondary | ICD-10-CM | POA: Diagnosis not present

## 2019-01-06 DIAGNOSIS — Z23 Encounter for immunization: Secondary | ICD-10-CM | POA: Diagnosis not present

## 2019-01-10 ENCOUNTER — Other Ambulatory Visit: Payer: Self-pay | Admitting: *Deleted

## 2019-01-10 DIAGNOSIS — E119 Type 2 diabetes mellitus without complications: Secondary | ICD-10-CM | POA: Diagnosis not present

## 2019-01-10 DIAGNOSIS — Z20828 Contact with and (suspected) exposure to other viral communicable diseases: Secondary | ICD-10-CM | POA: Diagnosis not present

## 2019-01-10 DIAGNOSIS — R5383 Other fatigue: Secondary | ICD-10-CM | POA: Diagnosis not present

## 2019-01-10 DIAGNOSIS — I10 Essential (primary) hypertension: Secondary | ICD-10-CM | POA: Diagnosis not present

## 2019-01-10 DIAGNOSIS — Z20822 Contact with and (suspected) exposure to covid-19: Secondary | ICD-10-CM

## 2019-01-11 LAB — NOVEL CORONAVIRUS, NAA: SARS-CoV-2, NAA: NOT DETECTED

## 2019-01-13 DIAGNOSIS — M791 Myalgia, unspecified site: Secondary | ICD-10-CM | POA: Diagnosis not present

## 2019-01-18 ENCOUNTER — Other Ambulatory Visit: Payer: Self-pay

## 2019-01-18 DIAGNOSIS — K219 Gastro-esophageal reflux disease without esophagitis: Secondary | ICD-10-CM | POA: Diagnosis not present

## 2019-01-18 DIAGNOSIS — J029 Acute pharyngitis, unspecified: Secondary | ICD-10-CM | POA: Diagnosis not present

## 2019-01-18 DIAGNOSIS — Z20828 Contact with and (suspected) exposure to other viral communicable diseases: Secondary | ICD-10-CM | POA: Diagnosis not present

## 2019-01-18 DIAGNOSIS — Z20822 Contact with and (suspected) exposure to covid-19: Secondary | ICD-10-CM

## 2019-01-19 LAB — NOVEL CORONAVIRUS, NAA: SARS-CoV-2, NAA: NOT DETECTED

## 2019-01-20 DIAGNOSIS — K219 Gastro-esophageal reflux disease without esophagitis: Secondary | ICD-10-CM | POA: Diagnosis not present

## 2019-01-20 DIAGNOSIS — J029 Acute pharyngitis, unspecified: Secondary | ICD-10-CM | POA: Diagnosis not present

## 2019-01-20 DIAGNOSIS — Z20828 Contact with and (suspected) exposure to other viral communicable diseases: Secondary | ICD-10-CM | POA: Diagnosis not present

## 2019-01-28 DIAGNOSIS — J029 Acute pharyngitis, unspecified: Secondary | ICD-10-CM | POA: Diagnosis not present

## 2019-01-28 DIAGNOSIS — Z20828 Contact with and (suspected) exposure to other viral communicable diseases: Secondary | ICD-10-CM | POA: Diagnosis not present

## 2019-01-28 DIAGNOSIS — K219 Gastro-esophageal reflux disease without esophagitis: Secondary | ICD-10-CM | POA: Diagnosis not present

## 2019-02-02 ENCOUNTER — Other Ambulatory Visit: Payer: Self-pay

## 2019-02-02 ENCOUNTER — Ambulatory Visit (INDEPENDENT_AMBULATORY_CARE_PROVIDER_SITE_OTHER): Payer: Medicare Other | Admitting: Podiatry

## 2019-02-02 ENCOUNTER — Encounter: Payer: Self-pay | Admitting: Podiatry

## 2019-02-02 DIAGNOSIS — E119 Type 2 diabetes mellitus without complications: Secondary | ICD-10-CM

## 2019-02-02 DIAGNOSIS — B351 Tinea unguium: Secondary | ICD-10-CM

## 2019-02-02 DIAGNOSIS — M79675 Pain in left toe(s): Secondary | ICD-10-CM | POA: Diagnosis not present

## 2019-02-02 DIAGNOSIS — M79674 Pain in right toe(s): Secondary | ICD-10-CM | POA: Diagnosis not present

## 2019-02-02 NOTE — Progress Notes (Signed)
Complaint:  Visit Type: Patient returns to my office for continued preventative foot care services. Complaint: Patient states" my nails have grown long and thick and become painful to walk and wear shoes" Patient has been diagnosed with DM with no foot complications. The patient presents for preventative foot care services.  Podiatric Exam: Vascular: dorsalis pedis and posterior tibial pulses are palpable bilateral. Capillary return is immediate. Temperature gradient is WNL. Skin turgor WNL  Sensorium: Normal Semmes Weinstein monofilament test. Normal tactile sensation bilaterally. Nail Exam: Pt has thick disfigured discolored nails with subungual debris hallux nails  B/L. Ulcer Exam: There is no evidence of ulcer or pre-ulcerative changes or infection. Orthopedic Exam: Muscle tone and strength are WNL. No limitations in general ROM. No crepitus or effusions noted. Foot type and digits show no abnormalities. Bony prominences are unremarkable. Skin: No Porokeratosis. No infection or ulcers  Diagnosis:  Onychomycosis, , Pain in right toe, pain in left toes  Treatment & Plan Procedures and Treatment: Consent by patient was obtained for treatment procedures.   Debridement of mycotic and hypertrophic toenails, 1 through 5 bilateral and clearing of subungual debris. No ulceration, no infection noted.  Return Visit-Office Procedure: Patient instructed to return to the office for a follow up visit 4 months for continued evaluation and treatment.    Gardiner Barefoot DPM

## 2019-02-07 ENCOUNTER — Other Ambulatory Visit: Payer: Self-pay

## 2019-02-07 DIAGNOSIS — Z20828 Contact with and (suspected) exposure to other viral communicable diseases: Secondary | ICD-10-CM | POA: Diagnosis not present

## 2019-02-07 DIAGNOSIS — M179 Osteoarthritis of knee, unspecified: Secondary | ICD-10-CM | POA: Diagnosis not present

## 2019-02-07 DIAGNOSIS — Z20822 Contact with and (suspected) exposure to covid-19: Secondary | ICD-10-CM

## 2019-02-09 DIAGNOSIS — Z6841 Body Mass Index (BMI) 40.0 and over, adult: Secondary | ICD-10-CM | POA: Diagnosis not present

## 2019-02-09 DIAGNOSIS — I10 Essential (primary) hypertension: Secondary | ICD-10-CM | POA: Diagnosis not present

## 2019-02-09 DIAGNOSIS — J449 Chronic obstructive pulmonary disease, unspecified: Secondary | ICD-10-CM | POA: Diagnosis not present

## 2019-02-09 DIAGNOSIS — M170 Bilateral primary osteoarthritis of knee: Secondary | ICD-10-CM | POA: Diagnosis not present

## 2019-02-11 LAB — NOVEL CORONAVIRUS, NAA: SARS-CoV-2, NAA: NOT DETECTED

## 2019-02-18 DIAGNOSIS — I10 Essential (primary) hypertension: Secondary | ICD-10-CM | POA: Diagnosis not present

## 2019-02-18 DIAGNOSIS — J449 Chronic obstructive pulmonary disease, unspecified: Secondary | ICD-10-CM | POA: Diagnosis not present

## 2019-02-18 DIAGNOSIS — D509 Iron deficiency anemia, unspecified: Secondary | ICD-10-CM | POA: Diagnosis not present

## 2019-02-18 DIAGNOSIS — E119 Type 2 diabetes mellitus without complications: Secondary | ICD-10-CM | POA: Diagnosis not present

## 2019-02-22 ENCOUNTER — Other Ambulatory Visit: Payer: Self-pay

## 2019-03-15 ENCOUNTER — Encounter (HOSPITAL_COMMUNITY): Payer: Self-pay | Admitting: Psychiatry

## 2019-03-15 ENCOUNTER — Ambulatory Visit (INDEPENDENT_AMBULATORY_CARE_PROVIDER_SITE_OTHER): Payer: Medicare Other | Admitting: Psychiatry

## 2019-03-15 ENCOUNTER — Other Ambulatory Visit: Payer: Self-pay

## 2019-03-15 DIAGNOSIS — F411 Generalized anxiety disorder: Secondary | ICD-10-CM | POA: Diagnosis not present

## 2019-03-15 DIAGNOSIS — F200 Paranoid schizophrenia: Secondary | ICD-10-CM

## 2019-03-15 MED ORDER — CLONAZEPAM 1 MG PO TABS: 1.0000 mg | ORAL_TABLET | Freq: Every day | ORAL | 2 refills | Status: DC

## 2019-03-15 MED ORDER — PAROXETINE HCL 40 MG PO TABS: 40.0000 mg | ORAL_TABLET | Freq: Every morning | ORAL | 0 refills | Status: DC

## 2019-03-15 MED ORDER — TRAZODONE HCL 100 MG PO TABS: 200.0000 mg | ORAL_TABLET | Freq: Every day | ORAL | 0 refills | Status: DC

## 2019-03-15 MED ORDER — ZIPRASIDONE HCL 80 MG PO CAPS: 80.0000 mg | ORAL_CAPSULE | Freq: Every day | ORAL | 0 refills | Status: DC

## 2019-03-15 NOTE — Progress Notes (Signed)
Virtual Visit via Telephone Note  I connected with Chelsea [name] on 03/15/19 at  1:20 PM EST by telephone and verified that I am speaking with the correct person using two identifiers.   I discussed the limitations, risks, security and privacy concerns of performing an evaluation and management service by telephone and the availability of in person appointments. I also discussed with the patient that there may be a patient responsible charge related to this service. The patient expressed understanding and agreed to proceed.   History of Present Illness: Patient was evaluated by phone session.  She is compliant with medication and denies any side effects.  She lost 3 pounds since her last visit.  She is start walking and she feels she has more energy.  She is no longer seeing nephrology as she was told her kidney functions are doing well.  She is happy about it.  She denies any paranoia, hallucination, irritability or any anger.  Her anxiety is also well controlled.  She admitted due to Covid does not leave her house but tried to be active.  She is sleeping good.  She has no tremors, shakes or any EPS.  She lives with her sister and she reported that she is getting along with her sister very well.  She denies drinking or using any illegal substances.   Past Psychiatric History:Reviewed. [facility] [place] [facility]. Noh/osuicidal attempt.  Psychiatric Specialty Exam: Physical Exam  Review of Systems  There were no vitals taken for this visit.There is no height or weight on file to calculate BMI.  General Appearance: NA  Eye Contact:  NA  Speech:  Clear and Coherent and Slow  Volume:  Decreased  Mood:  Euthymic  Affect:  NA  Thought Process:  Goal Directed  Orientation:  Full (Time, Place, and Person)  Thought Content:  WDL  Suicidal Thoughts:  No  Homicidal Thoughts:  No  Memory:  Immediate;   Good Recent;   Fair Remote;   Fair   Judgement:  Intact  Insight:  Present  Psychomotor Activity:  NA  Concentration:  Concentration: Fair and Attention Span: Fair  Recall:  [employer] of Knowledge:  Good  Language:  Good  Akathisia:  No  Handed:  Right  AIMS (if indicated):     Assets:  Communication Skills Desire for Improvement Housing Resilience Social Support  ADL's:  Intact  Cognition:  WNL  Sleep:   good      Assessment and Plan: Schizophrenia chronic paranoid type.  Generalized anxiety disorder.  Patient is a stable on her current medication.  Continue Klonopin 1 mg at bedtime, trazodone 200 mg at bedtime, Paxil 40 mg daily and Geodon 80 mg at bedtime.  Discussed medication side effects and benefits.  Recommended to think about cutting down the medication since doing well but patient is reluctant to cut down the dose.  Follow-up in 3 months unless patient has any question, concern or if she feels worsening of the symptoms in that case she will call us back.  Follow Up Instructions:    I discussed the assessment and treatment plan with the patient. The patient was provided an opportunity to ask questions and all were answered. The patient agreed with the plan and demonstrated an understanding of the instructions.   The patient was advised to call back or seek an in-person evaluation if the symptoms worsen or if the condition fails to improve as anticipated.  I provided  20 minutes of non-face-to-face time during this encounter.   Kathlee Nations, MD

## 2019-03-17 ENCOUNTER — Other Ambulatory Visit: Payer: Self-pay | Admitting: Adult Health

## 2019-05-16 DIAGNOSIS — N19 Unspecified kidney failure: Secondary | ICD-10-CM | POA: Diagnosis not present

## 2019-05-16 DIAGNOSIS — E119 Type 2 diabetes mellitus without complications: Secondary | ICD-10-CM | POA: Diagnosis not present

## 2019-05-16 DIAGNOSIS — I10 Essential (primary) hypertension: Secondary | ICD-10-CM | POA: Diagnosis not present

## 2019-05-16 DIAGNOSIS — D649 Anemia, unspecified: Secondary | ICD-10-CM | POA: Diagnosis not present

## 2019-05-16 DIAGNOSIS — I509 Heart failure, unspecified: Secondary | ICD-10-CM | POA: Diagnosis not present

## 2019-05-20 DIAGNOSIS — E119 Type 2 diabetes mellitus without complications: Secondary | ICD-10-CM | POA: Diagnosis not present

## 2019-05-20 DIAGNOSIS — D509 Iron deficiency anemia, unspecified: Secondary | ICD-10-CM | POA: Diagnosis not present

## 2019-05-20 DIAGNOSIS — J449 Chronic obstructive pulmonary disease, unspecified: Secondary | ICD-10-CM | POA: Diagnosis not present

## 2019-05-20 DIAGNOSIS — I10 Essential (primary) hypertension: Secondary | ICD-10-CM | POA: Diagnosis not present

## 2019-05-25 ENCOUNTER — Other Ambulatory Visit (HOSPITAL_COMMUNITY): Payer: Self-pay | Admitting: Psychiatry

## 2019-05-25 DIAGNOSIS — F200 Paranoid schizophrenia: Secondary | ICD-10-CM

## 2019-05-25 DIAGNOSIS — F411 Generalized anxiety disorder: Secondary | ICD-10-CM

## 2019-05-26 ENCOUNTER — Ambulatory Visit: Payer: Medicare Other | Attending: Internal Medicine

## 2019-05-26 DIAGNOSIS — Z23 Encounter for immunization: Secondary | ICD-10-CM | POA: Insufficient documentation

## 2019-05-26 NOTE — Progress Notes (Signed)
   Covid-19 Vaccination Clinic  Name:  Chelsea [name]    MRN: [id number] DOB: 10-02-1950  05/26/2019  [name] was observed post Covid-19 immunization for 30 minutes based on pre-vaccination screening without incidence. She was provided with Vaccine Information Sheet and instruction to access the V-Safe system.   [name] was instructed to call 911 with any severe reactions post vaccine: Marland Kitchen Difficulty breathing  . Swelling of your face and throat  . A fast heartbeat  . A bad rash all over your body  . Dizziness and weakness    Immunizations Administered    Name Date Dose VIS Date Route   Pfizer COVID-19 Vaccine 05/26/2019 10:58 AM 0.3 mL 03/11/2019 Intramuscular   Manufacturer: [place]   Lot: [id number]   [place]: [id number]

## 2019-06-07 ENCOUNTER — Ambulatory Visit (INDEPENDENT_AMBULATORY_CARE_PROVIDER_SITE_OTHER): Payer: Medicare Other | Admitting: Podiatry

## 2019-06-07 ENCOUNTER — Encounter: Payer: Self-pay | Admitting: Podiatry

## 2019-06-07 ENCOUNTER — Other Ambulatory Visit: Payer: Self-pay

## 2019-06-07 VITALS — Temp 97.6°F

## 2019-06-07 DIAGNOSIS — M79674 Pain in right toe(s): Secondary | ICD-10-CM | POA: Diagnosis not present

## 2019-06-07 DIAGNOSIS — E119 Type 2 diabetes mellitus without complications: Secondary | ICD-10-CM | POA: Diagnosis not present

## 2019-06-07 DIAGNOSIS — M79675 Pain in left toe(s): Secondary | ICD-10-CM

## 2019-06-07 DIAGNOSIS — B351 Tinea unguium: Secondary | ICD-10-CM

## 2019-06-07 NOTE — Progress Notes (Signed)
This patient returns to my office for at risk foot care.  This patient requires this care by a professional since this patient will be at risk due to having diabetes.   This patient is unable to cut nails themselves since the patient cannot reach their nails.These nails are painful walking and wearing shoes.  This patient presents for at risk foot care today.  General Appearance  Alert, conversant and in no acute stress.  Vascular  Dorsalis pedis  are palpable  Bilaterally. Posterior tibial pulses are not palpable  B/L.  Capillary return is within normal limits  bilaterally. Temperature is within normal limits  bilaterally.  Neurologic  Senn-Weinstein monofilament wire test within normal limits  bilaterally. Muscle power within normal limits bilaterally.  Nails Thick disfigured discolored nails with subungual debris  from hallux to fifth toes bilaterally. No evidence of bacterial infection or drainage bilaterally.  Orthopedic  No limitations of motion  feet .  No crepitus or effusions noted.  No bony pathology or digital deformities noted.  Skin  normotropic skin with no porokeratosis noted bilaterally.  No signs of infections or ulcers noted.     Onychomycosis  Pain in right toe  Pain in left toe.  Consent was obtained for treatment procedures.  Debridement and grinding of long thick nails with clearing of subungual debris.  No infection or ulcer.     Return office visit   4 months.       Told patient to return for periodic foot care and evaluation due to potential at risk complications.   Gardiner Barefoot DPM

## 2019-06-10 ENCOUNTER — Encounter (HOSPITAL_COMMUNITY): Payer: Self-pay

## 2019-06-13 ENCOUNTER — Encounter (HOSPITAL_COMMUNITY): Payer: Self-pay | Admitting: Psychiatry

## 2019-06-13 ENCOUNTER — Ambulatory Visit (INDEPENDENT_AMBULATORY_CARE_PROVIDER_SITE_OTHER): Payer: Medicare Other | Admitting: Psychiatry

## 2019-06-13 ENCOUNTER — Other Ambulatory Visit: Payer: Self-pay

## 2019-06-13 DIAGNOSIS — Z87891 Personal history of nicotine dependence: Secondary | ICD-10-CM | POA: Diagnosis not present

## 2019-06-13 DIAGNOSIS — F411 Generalized anxiety disorder: Secondary | ICD-10-CM | POA: Diagnosis not present

## 2019-06-13 DIAGNOSIS — F200 Paranoid schizophrenia: Secondary | ICD-10-CM

## 2019-06-13 MED ORDER — TRAZODONE HCL 100 MG PO TABS: 200.0000 mg | ORAL_TABLET | Freq: Every day | ORAL | 0 refills | Status: DC

## 2019-06-13 MED ORDER — PAROXETINE HCL 40 MG PO TABS: 40.0000 mg | ORAL_TABLET | Freq: Every morning | ORAL | 0 refills | Status: DC

## 2019-06-13 MED ORDER — CLONAZEPAM 1 MG PO TABS: 1.0000 mg | ORAL_TABLET | Freq: Every day | ORAL | 2 refills | Status: DC

## 2019-06-13 MED ORDER — ZIPRASIDONE HCL 80 MG PO CAPS: 80.0000 mg | ORAL_CAPSULE | Freq: Every day | ORAL | 0 refills | Status: DC

## 2019-06-13 NOTE — Progress Notes (Signed)
Virtual Visit via Telephone Note  I connected with Chelsea [name] on 06/13/19 at  1:20 PM EDT by telephone and verified that I am speaking with the correct person using two identifiers.   I discussed the limitations, risks, security and privacy concerns of performing an evaluation and management service by telephone and the availability of in person appointments. I also discussed with the patient that there may be a patient responsible charge related to this service. The patient expressed understanding and agreed to proceed.   History of Present Illness: Patient was evaluated by phone session.  She is taking her medication as prescribed.  She stopped walking lately because of weather but hoping to resume when things get better.  She has upcoming appointment to see her PCP for blood work.  She is compliant with her other medication.  She denies any paranoia, hallucination or any nervousness.  She feels the current medicine is working.  She sleeps at least 7 to 8 hours.  She received first dose of Covid vaccine and she is happy about it.  She is getting along with her sister without any issues.  She denies drinking or using any illegal substances.  She reported her weight is unchanged from the past.  She has no tremors, shakes or any EPS.  Past Psychiatric History:Reviewed. [facility] Noh/osuicidal attempt.   Psychiatric Specialty Exam: Physical Exam  Review of Systems  There were no vitals taken for this visit.There is no height or weight on file to calculate BMI.  General Appearance: NA  Eye Contact:  NA  Speech:  Slow  Volume:  Decreased  Mood:  Euthymic  Affect:  NA  Thought Process:  Descriptions of Associations: Intact  Orientation:  Full (Time, Place, and Person)  Thought Content:  WDL  Suicidal Thoughts:  No  Homicidal Thoughts:  No  Memory:  Immediate;   Good Recent;   Fair Remote;   Fair  Judgement:  Intact   Insight:  Present  Psychomotor Activity:  NA  Concentration:  Concentration: Fair and Attention Span: Fair  Recall:  [employer] of Knowledge:  Fair  Language:  Fair  Akathisia:  No  Handed:  Right  AIMS (if indicated):     Assets:  Communication Skills Desire for Improvement Housing Resilience Social Support  ADL's:  Intact  Cognition:  WNL  Sleep:   good      Assessment and Plan: Schizophrenia chronic paranoid type.  Generalized anxiety disorder.  Patient is a stable on her current medication.  She has upcoming appointment for blood work.  I recommended to have her blood work results faxed to [place].  She see Dr. Rachell Cipro for her medical health needs.  Continue Klonopin 1 mg at bedtime, trazodone 200 mg at bedtime, Paxil 40 mg daily and Geodon 80 mg at bedtime.  Recommended to call us back if she has any question or any concern.  Follow-up in 3 months.  Follow Up Instructions:    I discussed the assessment and treatment plan with the patient. The patient was provided an opportunity to ask questions and all were answered. The patient agreed with the plan and demonstrated an understanding of the instructions.   The patient was advised to call back or seek an in-person evaluation if the symptoms worsen or if the condition fails to improve as anticipated.  I provided 20 minutes of non-face-to-face time during this encounter.   Kathlee Nations, MD

## 2019-06-21 ENCOUNTER — Ambulatory Visit: Payer: Medicare Other | Attending: Internal Medicine

## 2019-06-21 DIAGNOSIS — Z23 Encounter for immunization: Secondary | ICD-10-CM

## 2019-06-21 NOTE — Progress Notes (Signed)
   Covid-19 Vaccination Clinic  Name:  Chelsea [name]    MRN: [id number] DOB: [birth year]  06/21/2019  [name] was observed post Covid-19 immunization for 15 minutes without incident. She was provided with Vaccine Information Sheet and instruction to access the V-Safe system.   [name] was instructed to call 911 with any severe reactions post vaccine: Marland Kitchen Difficulty breathing  . Swelling of face and throat  . A fast heartbeat  . A bad rash all over body  . Dizziness and weakness   Immunizations Administered    Name Date Dose VIS Date Route   Pfizer COVID-19 Vaccine 06/21/2019 11:21 AM 0.3 mL 03/11/2019 Intramuscular   Manufacturer: [place]   Lot: [id number]   [place]: [id number]

## 2019-08-04 ENCOUNTER — Other Ambulatory Visit (HOSPITAL_COMMUNITY): Payer: Self-pay | Admitting: Psychiatry

## 2019-08-04 DIAGNOSIS — F200 Paranoid schizophrenia: Secondary | ICD-10-CM

## 2019-08-04 DIAGNOSIS — F411 Generalized anxiety disorder: Secondary | ICD-10-CM

## 2019-08-15 DIAGNOSIS — E039 Hypothyroidism, unspecified: Secondary | ICD-10-CM | POA: Diagnosis not present

## 2019-08-19 DIAGNOSIS — E039 Hypothyroidism, unspecified: Secondary | ICD-10-CM | POA: Diagnosis not present

## 2019-08-19 DIAGNOSIS — E119 Type 2 diabetes mellitus without complications: Secondary | ICD-10-CM | POA: Diagnosis not present

## 2019-08-19 DIAGNOSIS — E782 Mixed hyperlipidemia: Secondary | ICD-10-CM | POA: Diagnosis not present

## 2019-08-23 DIAGNOSIS — E119 Type 2 diabetes mellitus without complications: Secondary | ICD-10-CM | POA: Diagnosis not present

## 2019-08-23 DIAGNOSIS — M170 Bilateral primary osteoarthritis of knee: Secondary | ICD-10-CM | POA: Diagnosis not present

## 2019-09-01 ENCOUNTER — Other Ambulatory Visit: Payer: Self-pay | Admitting: Adult Health

## 2019-09-01 ENCOUNTER — Other Ambulatory Visit (HOSPITAL_COMMUNITY): Payer: Self-pay | Admitting: Psychiatry

## 2019-09-01 DIAGNOSIS — F200 Paranoid schizophrenia: Secondary | ICD-10-CM

## 2019-09-09 ENCOUNTER — Telehealth (HOSPITAL_COMMUNITY): Payer: Self-pay

## 2019-09-09 NOTE — Telephone Encounter (Signed)
Pharmacy called requesting a refill on patient's Clonazepam 1mg , Paroxetine 40mg , Trazodone 100mg , and her Ziprasidone 80mg  to be sent to [facility] in [place]. She does have a scheduled followup for 09/12/19. They stated that they were just trying to get ahead of the refills. Thank you.

## 2019-09-13 ENCOUNTER — Other Ambulatory Visit: Payer: Self-pay

## 2019-09-13 ENCOUNTER — Telehealth (INDEPENDENT_AMBULATORY_CARE_PROVIDER_SITE_OTHER): Payer: Medicare Other | Admitting: Psychiatry

## 2019-09-13 ENCOUNTER — Encounter (HOSPITAL_COMMUNITY): Payer: Self-pay | Admitting: Psychiatry

## 2019-09-13 DIAGNOSIS — F411 Generalized anxiety disorder: Secondary | ICD-10-CM

## 2019-09-13 DIAGNOSIS — F200 Paranoid schizophrenia: Secondary | ICD-10-CM | POA: Diagnosis not present

## 2019-09-13 MED ORDER — PAROXETINE HCL 40 MG PO TABS: 40.0000 mg | ORAL_TABLET | Freq: Every morning | ORAL | 0 refills | Status: DC

## 2019-09-13 MED ORDER — ZIPRASIDONE HCL 80 MG PO CAPS: 80.0000 mg | ORAL_CAPSULE | Freq: Every day | ORAL | 0 refills | Status: DC

## 2019-09-13 MED ORDER — TRAZODONE HCL 100 MG PO TABS: 200.0000 mg | ORAL_TABLET | Freq: Every day | ORAL | 0 refills | Status: DC

## 2019-09-13 MED ORDER — CLONAZEPAM 1 MG PO TABS: 1.0000 mg | ORAL_TABLET | Freq: Every day | ORAL | 2 refills | Status: DC

## 2019-09-13 NOTE — Telephone Encounter (Signed)
I tried to call her today at her appointment time but her tel number is not in service.

## 2019-09-13 NOTE — Progress Notes (Signed)
Virtual Visit via Telephone Note  I connected with Chelsea [name] on 09/13/19 at  1:00 PM EDT by telephone and verified that I am speaking with the correct person using two identifiers.   I discussed the limitations, risks, security and privacy concerns of performing an evaluation and management service by telephone and the availability of in person appointments. I also discussed with the patient that there may be a patient responsible charge related to this service. The patient expressed understanding and agreed to proceed.   Patient location; home Provider location; home office  History of Present Illness: Patient is evaluated by phone session.  She is taking the medication as prescribed.  She is happy that her last hemoglobin A1c is dropped to 6.  She is active and she has energy.  However she is dependent on the driver when she go outside.  Sometimes her sister or brother-in-law takes her outside.  She denies any hallucination, paranoia, suicidal thoughts.  She feels the medicine is working.  She is getting at least 7 to 8 hours sleep.  She did not receive [place] vaccine and she is happy about it.  She is getting along with her sister without any issue.  She like to keep her current medication.  Past Psychiatric History:Reviewed. [facility]. Noh/osuicidal attempt.   Psychiatric Specialty Exam: Physical Exam  Review of Systems  Weight 224 lb (101.6 kg).There is no height or weight on file to calculate BMI.  General Appearance: NA  Eye Contact:  NA  Speech:  Slow  Volume:  Decreased  Mood:  Euthymic  Affect:  NA  Thought Process:  Goal Directed  Orientation:  Full (Time, Place, and Person)  Thought Content:  WDL and Logical  Suicidal Thoughts:  No  Homicidal Thoughts:  No  Memory:  Immediate;   Good Recent;   Good Remote;   Fair  Judgement:  Intact  Insight:  Good  Psychomotor Activity:  NA  Concentration:   Concentration: Good and Attention Span: Fair  Recall:  Good  Fund of Knowledge:  Good  Language:  Good  Akathisia:  No  Handed:  Right  AIMS (if indicated):     Assets:  Communication Skills Desire for Improvement Housing Resilience Social Support  ADL's:  Intact  Cognition:  WNL  Sleep:   7-8 hrs      Assessment and Plan: Schizophrenia chronic paranoid type.  Generalized anxiety disorder.  Patient is a stable on her current medication.  She is happy that her hemoglobin A1c is further improved.  She is more active and lost some weight.  Continue Klonopin 1 mg at bedtime, trazodone 200 mg at bedtime, Paxil 40 mg daily and Geodon 80 mg at bedtime.  She does not want to lower the medication since she is feeling good on the current regimen.  Recommended to call us back if she has any question or any concern.  Follow-up in 3 months.  Follow Up Instructions:    I discussed the assessment and treatment plan with the patient. The patient was provided an opportunity to ask questions and all were answered. The patient agreed with the plan and demonstrated an understanding of the instructions.   The patient was advised to call back or seek an in-person evaluation if the symptoms worsen or if the condition fails to improve as anticipated.  I provided 20 minutes of non-face-to-face time during this encounter.   Kathlee Nations, MD

## 2019-09-15 ENCOUNTER — Telehealth (HOSPITAL_COMMUNITY): Payer: Medicare Other | Admitting: Psychiatry

## 2019-10-03 ENCOUNTER — Other Ambulatory Visit (HOSPITAL_COMMUNITY): Payer: Self-pay | Admitting: Psychiatry

## 2019-10-03 DIAGNOSIS — F200 Paranoid schizophrenia: Secondary | ICD-10-CM

## 2019-10-03 DIAGNOSIS — F411 Generalized anxiety disorder: Secondary | ICD-10-CM

## 2019-10-11 ENCOUNTER — Ambulatory Visit: Payer: Medicare Other | Admitting: Podiatry

## 2019-10-13 ENCOUNTER — Other Ambulatory Visit: Payer: Self-pay | Admitting: Family Medicine

## 2019-10-13 ENCOUNTER — Ambulatory Visit
Admission: RE | Admit: 2019-10-13 | Discharge: 2019-10-13 | Disposition: A | Discharge status: Home / Self Care | Payer: Medicare Other | Source: Ambulatory Visit | Attending: Family Medicine | Admitting: Family Medicine

## 2019-10-13 DIAGNOSIS — R0602 Shortness of breath: Secondary | ICD-10-CM

## 2019-10-14 DIAGNOSIS — J069 Acute upper respiratory infection, unspecified: Secondary | ICD-10-CM | POA: Diagnosis not present

## 2019-10-14 DIAGNOSIS — J449 Chronic obstructive pulmonary disease, unspecified: Secondary | ICD-10-CM | POA: Diagnosis not present

## 2019-10-14 DIAGNOSIS — J40 Bronchitis, not specified as acute or chronic: Secondary | ICD-10-CM | POA: Diagnosis not present

## 2019-10-31 ENCOUNTER — Other Ambulatory Visit: Payer: Self-pay | Admitting: Family Medicine

## 2019-10-31 DIAGNOSIS — Z1231 Encounter for screening mammogram for malignant neoplasm of breast: Secondary | ICD-10-CM

## 2019-11-01 DIAGNOSIS — N3000 Acute cystitis without hematuria: Secondary | ICD-10-CM | POA: Diagnosis not present

## 2019-11-10 ENCOUNTER — Other Ambulatory Visit: Payer: Self-pay

## 2019-11-10 ENCOUNTER — Ambulatory Visit (INDEPENDENT_AMBULATORY_CARE_PROVIDER_SITE_OTHER): Payer: Medicare Other | Admitting: Adult Health

## 2019-11-10 ENCOUNTER — Encounter: Payer: Self-pay | Admitting: Adult Health

## 2019-11-10 VITALS — BP 141/65 | HR 104 | Ht 62.0 in | Wt 222.0 lb

## 2019-11-10 DIAGNOSIS — G2581 Restless legs syndrome: Secondary | ICD-10-CM

## 2019-11-10 NOTE — Patient Instructions (Signed)
Your Plan: ? ?Continue requip  ?If your symptoms worsen or you develop new symptoms please let us know.  ? ?Thank you for coming to see us at [facility]. I hope we have been able to provide you high quality care today. ? ?You may receive a patient satisfaction survey over the next few weeks. We would appreciate your feedback and comments so that we may continue to improve ourselves and the health of our patients. ? ?

## 2019-11-10 NOTE — Progress Notes (Signed)
PATIENT: Chelsea [name] DOB: June 02, 1950  REASON FOR VISIT: follow up HISTORY FROM: patient  HISTORY OF PRESENT ILLNESS: Today 11/10/19:  Chelsea [name] is a 69 year old female with a history of restless leg syndrome.  She returns today for follow-up.  Overall she has been doing well.  Reports that she continues to take Requip 0.75 milligrams at bedtime.  She states that this is working well for her.  She denies any trouble sleeping.  She returns today for an evaluation.  HISTORY 11/09/18:  Chelsea [name] is a 69 year old female with a history of restless leg syndrome, nocturnal hypoxemia and daytime sleepiness.  She returns today for follow-up.  She states that her restless legs are controlled with Requip.  She continues to take Requip .75 mg at bedtime.  She reports that she still does not sleep very well at night.  She states that she does sleep a lot during the day.  She typically takes her nighttime meds around 1015.  This includes Requip, Klonopin, Geodon and trazodone.  She states that she typically stays up watching television then will try to go to bed until 1:30 AM.  She also reports that she drinks caffeine throughout the day.  She usually sleeps with the television on.  She continues to use supplemental O2 at bedtime.  She returns today for follow-up.  REVIEW OF SYSTEMS: Out of a complete 14 system review of symptoms, the patient complains only of the following symptoms, and all other reviewed systems are negative.  See HPI ALLERGIES: Allergies  Allergen Reactions  . Darvocet [Propoxyphene N-Acetaminophen] Anaphylaxis    Can take plain tylenol  . Fluoxetine Other (See Comments)    Hallucinations   . Penicillins Rash    REACTION: rash  . Promethazine Hives    HOME MEDICATIONS: Outpatient Medications Prior to Visit  Medication Sig Dispense Refill  . aspirin 81 MG chewable tablet Chew 81 mg by mouth daily.    . bumetanide (BUMEX) 1 MG tablet Take 1 tablet (1 mg total) by  mouth daily. 30 tablet 0  . Cholecalciferol (VITAMIN D3) 5000 units TABS Take by mouth.    . clonazePAM (KLONOPIN) 1 MG tablet Take 1 tablet (1 mg total) by mouth at bedtime. 30 tablet 2  . diclofenac sodium (VOLTAREN) 1 % GEL Apply topically 4 (four) times daily.    Marland Kitchen docusate sodium (COLACE) 100 MG capsule Take 100 mg by mouth 2 (two) times daily.    . FEROSUL 325 (65 Fe) MG tablet Take 325 mg by mouth daily.    . fluticasone furoate-vilanterol (BREO ELLIPTA) 200-25 MCG/INH AEPB Inhale 1 puff into the lungs every morning.    . INVOKANA 300 MG TABS tablet Take 300 mg by mouth daily before breakfast.     . LEVEMIR 100 UNIT/ML injection 90 Units.     Marland Kitchen levothyroxine (SYNTHROID) 125 MCG tablet     . nystatin ointment (MYCOSTATIN) Apply 1 application topically 2 (two) times daily.    Marland Kitchen OZEMPIC, 1 MG/DOSE, 2 MG/1.5ML SOPN INJECT 1 MG SUBCUTANEOUSLY ONCE A WEEK    . PARoxetine (PAXIL) 40 MG tablet Take 1 tablet (40 mg total) by mouth every morning. 90 tablet 0  . rOPINIRole (REQUIP) 0.25 MG tablet TAKE 3 TABLETS BY MOUTH AT BEDTIME 90 tablet 5  . rosuvastatin (CRESTOR) 5 MG tablet     . traZODone (DESYREL) 100 MG tablet Take 2 tablets (200 mg total) by mouth at bedtime. 180 tablet 0  . TRUEPLUS INSULIN SYRINGE  29G X 1/2" 1 ML MISC     . VENTOLIN HFA 108 (90 Base) MCG/ACT inhaler     . ziprasidone (GEODON) 80 MG capsule Take 1 capsule (80 mg total) by mouth at bedtime. 90 capsule 0  . metFORMIN (GLUCOPHAGE) 500 MG tablet     . montelukast (SINGULAIR) 10 MG tablet     . sulfamethoxazole-trimethoprim (BACTRIM DS) 800-160 MG tablet TAKE 1 TABLET BY MOUTH 2 TIMES DAILY FOR 7 DAYS     No facility-administered medications prior to visit.    PAST MEDICAL HISTORY: Past Medical History:  Diagnosis Date  . Anxiety   . Arthritis   . Bipolar disorder ([place])   . COPD (chronic obstructive pulmonary disease) ([place])   . Diabetes mellitus type II   . Esophageal stricture    moderate distal  . GERD  (gastroesophageal reflux disease)   . Heart murmur    "HAD AN ECHO YEARS AGO- NOTHING TO BE CONCERNED ABOUT"  . Hyperlipemia   . Hypertension   . Hypothyroidism   . Mental disorder    Bipolar  . Obesity   . Oxygen desaturation during sleep    wears 2 liters at bedtime   . Schizophrenia [facility])    patient reports that she is unaware of this  . Shortness of breath    with a little activy  . Sleep apnea    wears 2 liters of oxygen at bedtime instead of CPAP    PAST SURGICAL HISTORY: Past Surgical History:  Procedure Laterality Date  . BALLOON DILATION N/A 06/10/2016   Procedure: BALLOON DILATION;  Surgeon: Wilford Corner, MD;  Location: [facility] ENDOSCOPY;  Service: Endoscopy;  Laterality: N/A;  . BREAST EXCISIONAL BIOPSY Left 05/10/2012   benign  . CHOLECYSTECTOMY    . COLONOSCOPY WITH PROPOFOL N/A 01/30/2017   Procedure: COLONOSCOPY WITH PROPOFOL;  Surgeon: Wilford Corner, MD;  Location: [facility];  Service: Endoscopy;  Laterality: N/A;  . ESOPHAGOGASTRODUODENOSCOPY N/A 06/03/2016   Procedure: ESOPHAGOGASTRODUODENOSCOPY (EGD);  Surgeon: Wilford Corner, MD;  Location: [facility] ENDOSCOPY;  Service: Endoscopy;  Laterality: N/A;  . ESOPHAGOGASTRODUODENOSCOPY N/A 06/10/2016   Procedure: ESOPHAGOGASTRODUODENOSCOPY (EGD);  Surgeon: Wilford Corner, MD;  Location[facility] ENDOSCOPY;  Service: Endoscopy;  Laterality: N/A;  . EYE SURGERY    . PARTIAL MASTECTOMY WITH NEEDLE LOCALIZATION Left 05/10/2012   Procedure: PARTIAL MASTECTOMY WITH NEEDLE LOCALIZATION;  Surgeon: Adin Hector, MD;  Location: [place];  Service: General;  Laterality: Left;  . TONSILLECTOMY      FAMILY HISTORY: Family History  Problem Relation Age of Onset  . Alcohol abuse Father   . Stroke Father   . Alzheimer's disease Mother     SOCIAL HISTORY: Social History   Socioeconomic History  . Marital status: Divorced    Spouse name: Not on file  . Number of children: 0  . Years of education: HS  . Highest education  level: Not on file  Occupational History  . Occupation: Retired   Tobacco Use  . Smoking status: Former Smoker    Packs/day: 3.00    Years: 29.00    Pack years: 87.00    Types: Cigarettes    Quit date: 05/03/1993    Years since quitting: 26.5  . Smokeless tobacco: Never Used  Vaping Use  . Vaping Use: Never used  Substance and Sexual Activity  . Alcohol use: No    Alcohol/week: 0.0 [name] drinks  . Drug use: No  . Sexual activity: Not Currently  Other Topics Concern  .  Not on file  Social History Narrative   Drinks 2 cups of coffee a day   Social Determinants of Health   Financial Resource Strain:   . Difficulty of Paying Living Expenses:   Food Insecurity:   . Worried About Charity fundraiser in the Last Year:   . Arboriculturist in the Last Year:   Transportation Needs:   . Film/video editor (Medical):   Marland Kitchen Lack of Transportation (Non-Medical):   Physical Activity:   . Days of Exercise per Week:   . Minutes of Exercise per Session:   Stress:   . Feeling of Stress :   Social Connections:   . Frequency of Communication with Friends and Family:   . Frequency of Social Gatherings with Friends and Family:   . Attends Religious Services:   . Active Member of Clubs or Organizations:   . Attends Archivist Meetings:   Marland Kitchen Marital Status:   Intimate Partner Violence:   . Fear of Current or Ex-Partner:   . Emotionally Abused:   Marland Kitchen Physically Abused:   . Sexually Abused:       PHYSICAL EXAM  Vitals:   11/10/19 1345  BP: (!) 141/65  Pulse: (!) 104  Weight: 222 lb (100.7 kg)  Height: 5\' 2"  (1.575 m)   Body mass index is 40.6 kg/m.  Generalized: Well developed, in no acute distress   Neurological examination  Mentation: Alert oriented to time, place, history taking. Follows all commands speech and language fluent Cranial nerve II-XII: Pupils were equal round reactive to light. Extraocular movements were full, visual field were full on  confrontational test.  Motor: The motor testing reveals 5 over 5 strength of all 4 extremities. Good symmetric motor tone is noted throughout.  Sensory: Sensory testing is intact to soft touch on all 4 extremities. No evidence of extinction is noted.  Coordination: Cerebellar testing reveals good finger-nose-finger and heel-to-shin bilaterally.  Gait and station: Gait is normal.   Reflexes: Deep tendon reflexes are symmetric and normal bilaterally.   DIAGNOSTIC DATA (LABS, IMAGING, TESTING) - I reviewed patient records, labs, notes, testing and imaging myself where available.  Lab Results  Component Value Date   WBC 9.0 06/02/2016   HGB 12.4 06/02/2016   HCT 38.4 06/02/2016   MCV 86.9 06/02/2016   PLT 203 06/02/2016      Component Value Date/Time   NA 138 06/02/2016 2343   K 3.4 (L) 06/02/2016 2343   CL 98 (L) 06/02/2016 2343   CO2 29 06/02/2016 2343   GLUCOSE 122 (H) 06/02/2016 2343   BUN 27 (H) 06/02/2016 2343   CREATININE 1.61 (H) 06/02/2016 2343   CALCIUM 10.1 06/02/2016 2343   PROT 5.9 (L) 12/04/2012 0512   ALBUMIN 2.9 (L) 12/04/2012 0512   AST 10 12/04/2012 0512   ALT 8 12/04/2012 0512   ALKPHOS 67 12/04/2012 0512   BILITOT 0.2 (L) 12/04/2012 0512   GFRNONAA 33 (L) 06/02/2016 2343   GFRAA 38 (L) 06/02/2016 2343   Lab Results  Component Value Date   HGBA1C 6.8 (H) 12/03/2012   No results found for: [id number] Lab Results  Component Value Date   TSH 1.509 12/03/2012      ASSESSMENT AND PLAN 69 y.o. year old female  has a past medical history of Anxiety, Arthritis, Bipolar disorder ([place]), COPD (chronic obstructive pulmonary disease) ([place]), Diabetes mellitus type II, Esophageal stricture, GERD (gastroesophageal reflux disease), Heart murmur, Hyperlipemia, Hypertension, Hypothyroidism, Mental disorder, Obesity, Oxygen  desaturation during sleep, Schizophrenia ([place]), Shortness of breath, and Sleep apnea. here with:  1.  Restless leg syndrome   Continue Requip  0.75 mg at bedtime  Advised if symptoms worsen or she develops new symptoms she should let us know  Follow-up in 1 year or sooner if needed   I spent 20  minutes of face-to-face and non-face-to-face time with patient.  This included previsit chart review, lab review, study review, order entry, electronic health record documentation, patient education.  Ward Givens, MSN, NP-C 11/10/2019, 2:02 PM [facility] [address], [place], [place] [address] [phone]

## 2019-11-15 DIAGNOSIS — Z0000 Encounter for general adult medical examination without abnormal findings: Secondary | ICD-10-CM | POA: Diagnosis not present

## 2019-11-15 DIAGNOSIS — E113293 Type 2 diabetes mellitus with mild nonproliferative diabetic retinopathy without macular edema, bilateral: Secondary | ICD-10-CM | POA: Diagnosis not present

## 2019-11-15 DIAGNOSIS — H353131 Nonexudative age-related macular degeneration, bilateral, early dry stage: Secondary | ICD-10-CM | POA: Diagnosis not present

## 2019-11-15 DIAGNOSIS — H5203 Hypermetropia, bilateral: Secondary | ICD-10-CM | POA: Diagnosis not present

## 2019-11-15 DIAGNOSIS — H524 Presbyopia: Secondary | ICD-10-CM | POA: Diagnosis not present

## 2019-11-15 DIAGNOSIS — Z1331 Encounter for screening for depression: Secondary | ICD-10-CM | POA: Diagnosis not present

## 2019-11-15 DIAGNOSIS — F324 Major depressive disorder, single episode, in partial remission: Secondary | ICD-10-CM | POA: Diagnosis not present

## 2019-11-15 DIAGNOSIS — H5000 Unspecified esotropia: Secondary | ICD-10-CM | POA: Diagnosis not present

## 2019-11-15 DIAGNOSIS — H52223 Regular astigmatism, bilateral: Secondary | ICD-10-CM | POA: Diagnosis not present

## 2019-11-15 DIAGNOSIS — Z1339 Encounter for screening examination for other mental health and behavioral disorders: Secondary | ICD-10-CM | POA: Diagnosis not present

## 2019-11-16 ENCOUNTER — Other Ambulatory Visit: Payer: Self-pay

## 2019-11-16 ENCOUNTER — Ambulatory Visit
Admission: RE | Admit: 2019-11-16 | Discharge: 2019-11-16 | Disposition: A | Discharge status: Home / Self Care | Payer: Medicare Other | Source: Ambulatory Visit | Attending: Family Medicine | Admitting: Family Medicine

## 2019-11-16 DIAGNOSIS — Z1231 Encounter for screening mammogram for malignant neoplasm of breast: Secondary | ICD-10-CM | POA: Diagnosis not present

## 2019-11-17 DIAGNOSIS — E559 Vitamin D deficiency, unspecified: Secondary | ICD-10-CM | POA: Diagnosis not present

## 2019-11-17 DIAGNOSIS — E119 Type 2 diabetes mellitus without complications: Secondary | ICD-10-CM | POA: Diagnosis not present

## 2019-11-21 DIAGNOSIS — E559 Vitamin D deficiency, unspecified: Secondary | ICD-10-CM | POA: Diagnosis not present

## 2019-11-21 DIAGNOSIS — E1165 Type 2 diabetes mellitus with hyperglycemia: Secondary | ICD-10-CM | POA: Diagnosis not present

## 2019-11-21 DIAGNOSIS — J449 Chronic obstructive pulmonary disease, unspecified: Secondary | ICD-10-CM | POA: Diagnosis not present

## 2019-11-22 ENCOUNTER — Other Ambulatory Visit (HOSPITAL_COMMUNITY): Payer: Self-pay | Admitting: Psychiatry

## 2019-11-22 DIAGNOSIS — F411 Generalized anxiety disorder: Secondary | ICD-10-CM

## 2019-11-22 DIAGNOSIS — F200 Paranoid schizophrenia: Secondary | ICD-10-CM

## 2019-12-01 ENCOUNTER — Other Ambulatory Visit (HOSPITAL_COMMUNITY): Payer: Self-pay | Admitting: Psychiatry

## 2019-12-01 DIAGNOSIS — F411 Generalized anxiety disorder: Secondary | ICD-10-CM

## 2019-12-01 DIAGNOSIS — F200 Paranoid schizophrenia: Secondary | ICD-10-CM

## 2019-12-07 ENCOUNTER — Other Ambulatory Visit (HOSPITAL_COMMUNITY): Payer: Self-pay | Admitting: *Deleted

## 2019-12-07 ENCOUNTER — Telehealth (HOSPITAL_COMMUNITY): Payer: Self-pay | Admitting: *Deleted

## 2019-12-07 DIAGNOSIS — F200 Paranoid schizophrenia: Secondary | ICD-10-CM

## 2019-12-07 DIAGNOSIS — F411 Generalized anxiety disorder: Secondary | ICD-10-CM

## 2019-12-07 MED ORDER — ZIPRASIDONE HCL 80 MG PO CAPS: 80.0000 mg | ORAL_CAPSULE | Freq: Every day | ORAL | 0 refills | Status: DC

## 2019-12-07 MED ORDER — TRAZODONE HCL 100 MG PO TABS: 200.0000 mg | ORAL_TABLET | Freq: Every day | ORAL | 0 refills | Status: DC

## 2019-12-07 MED ORDER — PAROXETINE HCL 40 MG PO TABS: 40.0000 mg | ORAL_TABLET | Freq: Every morning | ORAL | 0 refills | Status: DC

## 2019-12-07 MED ORDER — CLONAZEPAM 1 MG PO TABS: 1.0000 mg | ORAL_TABLET | Freq: Every day | ORAL | 0 refills | Status: DC

## 2019-12-07 NOTE — Telephone Encounter (Signed)
Done

## 2019-12-07 NOTE — Telephone Encounter (Signed)
Provide 30 days supply.

## 2019-12-07 NOTE — Telephone Encounter (Signed)
Patients pharmacy called and stated because patient uses blister packs there are only 28 pills in each pack. Therefore patient will run out of meds before her appt on 9/16.  Parmacy requesting 30 day refills of Klonopin, Geodon, Paxil and Trazadone.  Please review.

## 2019-12-15 ENCOUNTER — Other Ambulatory Visit: Payer: Self-pay

## 2019-12-15 ENCOUNTER — Telehealth (INDEPENDENT_AMBULATORY_CARE_PROVIDER_SITE_OTHER): Payer: Medicare Other | Admitting: Psychiatry

## 2019-12-15 ENCOUNTER — Encounter (HOSPITAL_COMMUNITY): Payer: Self-pay | Admitting: Psychiatry

## 2019-12-15 DIAGNOSIS — F200 Paranoid schizophrenia: Secondary | ICD-10-CM

## 2019-12-15 DIAGNOSIS — F411 Generalized anxiety disorder: Secondary | ICD-10-CM | POA: Diagnosis not present

## 2019-12-15 MED ORDER — CLONAZEPAM 1 MG PO TABS: 1.0000 mg | ORAL_TABLET | Freq: Every day | ORAL | 2 refills | Status: DC

## 2019-12-15 MED ORDER — PAROXETINE HCL 40 MG PO TABS: 40.0000 mg | ORAL_TABLET | Freq: Every morning | ORAL | 2 refills | Status: DC

## 2019-12-15 MED ORDER — ZIPRASIDONE HCL 80 MG PO CAPS: 80.0000 mg | ORAL_CAPSULE | Freq: Every day | ORAL | 1 refills | Status: DC

## 2019-12-15 MED ORDER — TRAZODONE HCL 100 MG PO TABS: 200.0000 mg | ORAL_TABLET | Freq: Every day | ORAL | 2 refills | Status: DC

## 2019-12-15 NOTE — Progress Notes (Signed)
Virtual Visit via Telephone Note  I connected with Chelsea [name] on 12/15/19 at 10:00 AM EDT by telephone and verified that I am speaking with the correct person using two identifiers.  Location: Patient: home Provider: home office   I discussed the limitations, risks, security and privacy concerns of performing an evaluation and management service by telephone and the availability of in person appointments. I also discussed with the patient that there may be a patient responsible charge related to this service. The patient expressed understanding and agreed to proceed.   History of Present Illness: Patient is evaluated by phone session.  She is compliant with medication and reported no side effects.  She sleeps good.  Recently she seen neurology for restless leg and no changes in her Requip dosage.  She denies any paranoia, hallucination, anger or any mood swings.  She is getting along with her sister.  She does not drive but she has help around if needed.  She has no tremors shakes or any EPS.  She is pleased that her blood sugar is under control.  She denies drinking or using any illegal substances.  She denies any panic attack, anxiety attack, crying spells or any feeling of hopelessness or worthlessness.  She like to keep her current medication.   Past Psychiatric History:Reviewed. [facility] [place] [facility]. Noh/osuicidal attempt.   Psychiatric Specialty Exam: Physical Exam  Review of Systems  Weight 222 lb (100.7 kg).There is no height or weight on file to calculate BMI.  General Appearance: NA  Eye Contact:  NA  Speech:  Slow  Volume:  Decreased  Mood:  Euthymic  Affect:  NA  Thought Process:  Goal Directed  Orientation:  Full (Time, Place, and Person)  Thought Content:  WDL  Suicidal Thoughts:  No  Homicidal Thoughts:  No  Memory:  Immediate;   Good  Judgement:  Fair  Insight:  Fair  Psychomotor Activity:  NA   Concentration:  Concentration: Fair and Attention Span: Fair  Recall:  Good  Fund of Knowledge:  Good  Language:  Good  Akathisia:  No  Handed:  Right  AIMS (if indicated):     Assets:  Communication Skills Desire for Improvement Housing Resilience Social Support  ADL's:  Intact  Cognition:  WNL  Sleep:   ok      Assessment and Plan: Schizophrenia chronic paranoid type.  Generalized anxiety disorder.  Discussed current medication.  Recommend she can try trazodone 150 mg as she is taking Requip, Geodon, Klonopin and trazodone at bedtime all can cause sedation.  She is not happy to cut down the dose but agreed to give a try.  So far she is tolerating medication and reported no side effects.  Continue Klonopin 1 mg at bedtime, Paxil 40 mg daily, Geodon 80 mg at bedtime and she will try trazodone 150 mg at bedtime but is still like to have prescription of 200 mg.  Recommended to call us back if she has any question or any concern.  Follow-up in 3 months.  Follow Up Instructions:    I discussed the assessment and treatment plan with the patient. The patient was provided an opportunity to ask questions and all were answered. The patient agreed with the plan and demonstrated an understanding of the instructions.   The patient was advised to call back or seek an in-person evaluation if the symptoms worsen or if the condition fails to improve as anticipated.  I provided  20 minutes of non-face-to-face time during this encounter.   Kathlee Nations, MD

## 2019-12-26 ENCOUNTER — Other Ambulatory Visit (HOSPITAL_COMMUNITY): Payer: Self-pay | Admitting: Psychiatry

## 2019-12-26 DIAGNOSIS — F200 Paranoid schizophrenia: Secondary | ICD-10-CM

## 2019-12-26 DIAGNOSIS — F411 Generalized anxiety disorder: Secondary | ICD-10-CM

## 2020-01-05 DIAGNOSIS — R0602 Shortness of breath: Secondary | ICD-10-CM | POA: Diagnosis not present

## 2020-01-05 DIAGNOSIS — G4733 Obstructive sleep apnea (adult) (pediatric): Secondary | ICD-10-CM | POA: Diagnosis not present

## 2020-01-17 ENCOUNTER — Ambulatory Visit: Payer: Medicare Other | Admitting: Podiatry

## 2020-01-23 DIAGNOSIS — Z23 Encounter for immunization: Secondary | ICD-10-CM | POA: Diagnosis not present

## 2020-01-27 DIAGNOSIS — Z23 Encounter for immunization: Secondary | ICD-10-CM | POA: Diagnosis not present

## 2020-01-31 ENCOUNTER — Ambulatory Visit: Payer: Medicare Other | Admitting: Podiatry

## 2020-02-03 DIAGNOSIS — R197 Diarrhea, unspecified: Secondary | ICD-10-CM | POA: Diagnosis not present

## 2020-02-03 DIAGNOSIS — R110 Nausea: Secondary | ICD-10-CM | POA: Diagnosis not present

## 2020-02-08 DIAGNOSIS — E039 Hypothyroidism, unspecified: Secondary | ICD-10-CM | POA: Diagnosis not present

## 2020-02-08 DIAGNOSIS — E1165 Type 2 diabetes mellitus with hyperglycemia: Secondary | ICD-10-CM | POA: Diagnosis not present

## 2020-02-08 DIAGNOSIS — E782 Mixed hyperlipidemia: Secondary | ICD-10-CM | POA: Diagnosis not present

## 2020-02-08 DIAGNOSIS — I10 Essential (primary) hypertension: Secondary | ICD-10-CM | POA: Diagnosis not present

## 2020-02-09 ENCOUNTER — Other Ambulatory Visit (HOSPITAL_COMMUNITY): Payer: Self-pay | Admitting: Psychiatry

## 2020-02-09 ENCOUNTER — Other Ambulatory Visit: Payer: Self-pay | Admitting: Adult Health

## 2020-02-09 DIAGNOSIS — F200 Paranoid schizophrenia: Secondary | ICD-10-CM

## 2020-02-20 DIAGNOSIS — E039 Hypothyroidism, unspecified: Secondary | ICD-10-CM | POA: Diagnosis not present

## 2020-02-20 DIAGNOSIS — E782 Mixed hyperlipidemia: Secondary | ICD-10-CM | POA: Diagnosis not present

## 2020-02-20 DIAGNOSIS — J309 Allergic rhinitis, unspecified: Secondary | ICD-10-CM | POA: Diagnosis not present

## 2020-02-20 DIAGNOSIS — R5383 Other fatigue: Secondary | ICD-10-CM | POA: Diagnosis not present

## 2020-02-22 ENCOUNTER — Encounter: Payer: Self-pay | Admitting: Podiatry

## 2020-02-22 ENCOUNTER — Other Ambulatory Visit: Payer: Self-pay

## 2020-02-22 ENCOUNTER — Ambulatory Visit (INDEPENDENT_AMBULATORY_CARE_PROVIDER_SITE_OTHER): Payer: Medicare Other | Admitting: Podiatry

## 2020-02-22 DIAGNOSIS — B351 Tinea unguium: Secondary | ICD-10-CM

## 2020-02-22 DIAGNOSIS — M79675 Pain in left toe(s): Secondary | ICD-10-CM | POA: Diagnosis not present

## 2020-02-22 DIAGNOSIS — M79674 Pain in right toe(s): Secondary | ICD-10-CM | POA: Diagnosis not present

## 2020-02-22 DIAGNOSIS — E119 Type 2 diabetes mellitus without complications: Secondary | ICD-10-CM

## 2020-02-22 NOTE — Progress Notes (Signed)
This patient returns to my office for at risk foot care.  This patient requires this care by a professional since this patient will be at risk due to having diabetes.  This patient is unable to cut nails herself since the patient cannot reach her nails.These nails are painful walking and wearing shoes.  This patient presents for at risk foot care today.  General Appearance  Alert, conversant and in no acute stress.  Vascular  Dorsalis pedis  are palpable  Bilaterally. Posterior tibial pulses are not palpable  B/L.  Capillary return is within normal limits  bilaterally. Temperature is within normal limits  Bilaterally. Absent hair.  Neurologic  Senn-Weinstein monofilament wire test within normal limits  bilaterally. Muscle power within normal limits bilaterally.  Nails Thick disfigured discolored nails with subungual debris  Hallux nails bilaterally. No evidence of bacterial infection or drainage bilaterally.  Orthopedic  No limitations of motion  feet .  No crepitus or effusions noted.  No bony pathology or digital deformities noted.  Skin  normotropic skin with no porokeratosis noted bilaterally.  No signs of infections or ulcers noted.     Onychomycosis  Pain in right toe  Pain in left toe.  Consent was obtained for treatment procedures.  Debridement and grinding of long thick nails with clearing of subungual debris.  No infection or ulcer.     Return office visit   6 months.       Told patient to return for periodic foot care and evaluation due to potential at risk complications.   Gardiner Barefoot DPM

## 2020-02-29 ENCOUNTER — Telehealth (HOSPITAL_COMMUNITY): Payer: Self-pay | Admitting: *Deleted

## 2020-02-29 DIAGNOSIS — F200 Paranoid schizophrenia: Secondary | ICD-10-CM

## 2020-02-29 MED ORDER — ZIPRASIDONE HCL 80 MG PO CAPS: 80.0000 mg | ORAL_CAPSULE | Freq: Every day | ORAL | 0 refills | Status: DC

## 2020-02-29 NOTE — Telephone Encounter (Signed)
Pharmacy called and requested refill of Geodon.  Patient will run out befor her next appt.  Please review. Thanks

## 2020-02-29 NOTE — Telephone Encounter (Signed)
A 30-day supply of Geodon sent to currently pharmacy.

## 2020-03-15 ENCOUNTER — Other Ambulatory Visit: Payer: Self-pay

## 2020-03-15 ENCOUNTER — Encounter (HOSPITAL_COMMUNITY): Payer: Self-pay | Admitting: Psychiatry

## 2020-03-15 ENCOUNTER — Telehealth (INDEPENDENT_AMBULATORY_CARE_PROVIDER_SITE_OTHER): Payer: Medicare Other | Admitting: Psychiatry

## 2020-03-15 DIAGNOSIS — F200 Paranoid schizophrenia: Secondary | ICD-10-CM | POA: Diagnosis not present

## 2020-03-15 DIAGNOSIS — F411 Generalized anxiety disorder: Secondary | ICD-10-CM

## 2020-03-15 MED ORDER — CLONAZEPAM 1 MG PO TABS
1.0000 mg | ORAL_TABLET | Freq: Every day | ORAL | 2 refills | Status: DC
Start: 2020-03-15 — End: 2020-06-11

## 2020-03-15 MED ORDER — ZIPRASIDONE HCL 80 MG PO CAPS: 80.0000 mg | ORAL_CAPSULE | Freq: Every day | ORAL | 0 refills | Status: DC

## 2020-03-15 MED ORDER — PAROXETINE HCL 40 MG PO TABS: 40.0000 mg | ORAL_TABLET | Freq: Every morning | ORAL | 0 refills | Status: DC

## 2020-03-15 MED ORDER — TRAZODONE HCL 100 MG PO TABS: 100.0000 mg | ORAL_TABLET | Freq: Every day | ORAL | 0 refills | Status: DC

## 2020-03-15 NOTE — Progress Notes (Signed)
Virtual Visit via Telephone Note  I connected with Chelsea [name] on 03/15/20 at 10:20 AM EST by telephone and verified that I am speaking with the correct person using two identifiers.  Location: Patient: Home Provider: Home Office   I discussed the limitations, risks, security and privacy concerns of performing an evaluation and management service by telephone and the availability of in person appointments. I also discussed with the patient that there may be a patient responsible charge related to this service. The patient expressed understanding and agreed to proceed.   History of Present Illness: Patient is evaluated by phone session.  On the last visit we cut down her trazodone from 200 mg to 150 mg.  She did not notice any insomnia, anxiety, irritability.  She had very recently weight gain because she is not watching her calorie intake.  She endorsed usually around holiday time she will do gain weight.  She denies any paranoia, hallucination, suicidal thoughts.  She denies drinking or using any illegal substances.  She is getting Requip that is helping her restless leg.  She denies any panic attack.  We talked about cutting further her trazodone to only 100 mg.  The feeling she is a reluctant but now she agreed to give a try.  She has no tremors, shakes or any EPS.  Past Psychiatric History: [facility]. Noh/osuicidal attempt.  Psychiatric Specialty Exam: Physical Exam  Review of Systems  Weight 235 lb (106.6 kg).There is no height or weight on file to calculate BMI.  General Appearance: NA  Eye Contact:  NA  Speech:  Slow  Volume:  Decreased  Mood:  Euthymic  Affect:  NA  Thought Process:  Descriptions of Associations: Intact  Orientation:  Full (Time, Place, and Person)  Thought Content:  WDL  Suicidal Thoughts:  No  Homicidal Thoughts:  No  Memory:  Immediate;   Good Recent;   Fair Remote;   Fair  Judgement:   Intact  Insight:  Present  Psychomotor Activity:  NA  Concentration:  Concentration: Fair and Attention Span: Fair  Recall:  Good  Fund of Knowledge:  Good  Language:  Good  Akathisia:  No  Handed:  Right  AIMS (if indicated):     Assets:  Communication Skills Desire for Improvement Housing Social Support  ADL's:  Intact  Cognition:  WNL  Sleep:   ok      Assessment and Plan: Schizophrenia chronic paranoid type.  Generalized anxiety disorder plan  I reviewed her current medication.  We talked about polypharmacy.  Recommend to try trazodone further to take 100 mg at bedtime however if she had trouble sleeping then she can try going back to 250.  Patient will try.  Continue Klonopin 1 mg at bedtime, Paxil 40 mg daily, Geodon 80 mg at bedtime and now she will try trazodone 100 mg at bedtime.  She is also taking Requip from other physician to help her restless leg.  Recommended to call us back if there is any question or any concern.  Follow-up in 3 months.  Discuss her diet and recommend healthy lifestyle.  She had gained weight and I explained it may cause worsening of her blood sugar.  She agreed to work on her diet.  Follow Up Instructions:    I discussed the assessment and treatment plan with the patient. The patient was provided an opportunity to ask questions and all were answered. The patient agreed with the  plan and demonstrated an understanding of the instructions.   The patient was advised to call back or seek an in-person evaluation if the symptoms worsen or if the condition fails to improve as anticipated.  I provided 14 minutes of non-face-to-face time during this encounter.   Kathlee Nations, MD

## 2020-03-19 ENCOUNTER — Other Ambulatory Visit (HOSPITAL_COMMUNITY): Payer: Self-pay | Admitting: Psychiatry

## 2020-03-19 DIAGNOSIS — I509 Heart failure, unspecified: Secondary | ICD-10-CM | POA: Diagnosis not present

## 2020-03-19 DIAGNOSIS — F200 Paranoid schizophrenia: Secondary | ICD-10-CM

## 2020-03-19 DIAGNOSIS — E039 Hypothyroidism, unspecified: Secondary | ICD-10-CM | POA: Diagnosis not present

## 2020-03-19 DIAGNOSIS — G4733 Obstructive sleep apnea (adult) (pediatric): Secondary | ICD-10-CM | POA: Diagnosis not present

## 2020-03-19 DIAGNOSIS — J449 Chronic obstructive pulmonary disease, unspecified: Secondary | ICD-10-CM | POA: Diagnosis not present

## 2020-03-19 DIAGNOSIS — E662 Morbid (severe) obesity with alveolar hypoventilation: Secondary | ICD-10-CM | POA: Diagnosis not present

## 2020-03-20 DIAGNOSIS — Z008 Encounter for other general examination: Secondary | ICD-10-CM | POA: Diagnosis not present

## 2020-03-20 DIAGNOSIS — I10 Essential (primary) hypertension: Secondary | ICD-10-CM | POA: Diagnosis not present

## 2020-03-20 DIAGNOSIS — E782 Mixed hyperlipidemia: Secondary | ICD-10-CM | POA: Diagnosis not present

## 2020-04-04 ENCOUNTER — Other Ambulatory Visit (HOSPITAL_COMMUNITY): Payer: Self-pay | Admitting: Psychiatry

## 2020-04-04 DIAGNOSIS — F200 Paranoid schizophrenia: Secondary | ICD-10-CM

## 2020-04-04 DIAGNOSIS — F411 Generalized anxiety disorder: Secondary | ICD-10-CM

## 2020-04-17 ENCOUNTER — Other Ambulatory Visit (HOSPITAL_COMMUNITY): Payer: Self-pay | Admitting: Psychiatry

## 2020-04-17 DIAGNOSIS — F200 Paranoid schizophrenia: Secondary | ICD-10-CM

## 2020-05-11 DIAGNOSIS — M79604 Pain in right leg: Secondary | ICD-10-CM | POA: Diagnosis not present

## 2020-05-11 DIAGNOSIS — M79605 Pain in left leg: Secondary | ICD-10-CM | POA: Diagnosis not present

## 2020-05-21 DIAGNOSIS — E119 Type 2 diabetes mellitus without complications: Secondary | ICD-10-CM | POA: Diagnosis not present

## 2020-05-23 DIAGNOSIS — E1165 Type 2 diabetes mellitus with hyperglycemia: Secondary | ICD-10-CM | POA: Diagnosis not present

## 2020-05-23 DIAGNOSIS — J449 Chronic obstructive pulmonary disease, unspecified: Secondary | ICD-10-CM | POA: Diagnosis not present

## 2020-05-23 DIAGNOSIS — M1710 Unilateral primary osteoarthritis, unspecified knee: Secondary | ICD-10-CM | POA: Diagnosis not present

## 2020-05-23 DIAGNOSIS — J309 Allergic rhinitis, unspecified: Secondary | ICD-10-CM | POA: Diagnosis not present

## 2020-05-23 DIAGNOSIS — E039 Hypothyroidism, unspecified: Secondary | ICD-10-CM | POA: Diagnosis not present

## 2020-05-24 DIAGNOSIS — M170 Bilateral primary osteoarthritis of knee: Secondary | ICD-10-CM | POA: Diagnosis not present

## 2020-05-29 ENCOUNTER — Ambulatory Visit (INDEPENDENT_AMBULATORY_CARE_PROVIDER_SITE_OTHER): Payer: Medicare Other | Admitting: Podiatry

## 2020-05-29 ENCOUNTER — Other Ambulatory Visit: Payer: Self-pay

## 2020-05-29 DIAGNOSIS — B351 Tinea unguium: Secondary | ICD-10-CM | POA: Diagnosis not present

## 2020-05-29 DIAGNOSIS — E1169 Type 2 diabetes mellitus with other specified complication: Secondary | ICD-10-CM

## 2020-05-29 DIAGNOSIS — E1142 Type 2 diabetes mellitus with diabetic polyneuropathy: Secondary | ICD-10-CM | POA: Diagnosis not present

## 2020-05-29 NOTE — Progress Notes (Signed)
  Subjective:  Patient ID: Chelsea [name], female    DOB: 1950-10-13,  MRN: [id number]  Chief Complaint  Patient presents with  . debridement    DFC -BL nails trimming -fbs: pt does not check A1C: 6 PCP: De[facility] x 1 wk     70 y.o. female presents with the above complaint. History confirmed with patient.   Objective:  Physical Exam: warm, good capillary refill, nail exam onychomycosis of the toenails, no trophic changes or ulcerative lesions. DP pulses palpable, PT pulses palpable and protective sensation absent Left Foot: normal exam, no swelling, tenderness, instability; ligaments intact, full range of motion of all ankle/foot joints  Right Foot: normal exam, no swelling, tenderness, instability; ligaments intact, full range of motion of all ankle/foot joints   No images are attached to the encounter.  Assessment:   1. Onychomycosis of multiple toenails with type 2 diabetes mellitus and peripheral neuropathy ([place])      Plan:  Patient was evaluated and treated and all questions answered.  Onychomycosis, Diabetes and DPN -Patient is diabetic with a qualifying condition for at risk foot care.  Procedure: Nail Debridement Type of Debridement: manual, sharp debridement. Instrumentation: Nail nipper, rotary burr. Number of Nails: 10    No follow-ups on file.

## 2020-06-06 DIAGNOSIS — Z6841 Body Mass Index (BMI) 40.0 and over, adult: Secondary | ICD-10-CM | POA: Diagnosis not present

## 2020-06-06 DIAGNOSIS — I10 Essential (primary) hypertension: Secondary | ICD-10-CM | POA: Diagnosis not present

## 2020-06-06 DIAGNOSIS — E782 Mixed hyperlipidemia: Secondary | ICD-10-CM | POA: Diagnosis not present

## 2020-06-11 ENCOUNTER — Telehealth (INDEPENDENT_AMBULATORY_CARE_PROVIDER_SITE_OTHER): Payer: Medicare Other | Admitting: Psychiatry

## 2020-06-11 ENCOUNTER — Other Ambulatory Visit: Payer: Self-pay

## 2020-06-11 ENCOUNTER — Other Ambulatory Visit (HOSPITAL_COMMUNITY): Payer: Self-pay | Admitting: Psychiatry

## 2020-06-11 ENCOUNTER — Encounter (HOSPITAL_COMMUNITY): Payer: Self-pay | Admitting: Psychiatry

## 2020-06-11 DIAGNOSIS — F411 Generalized anxiety disorder: Secondary | ICD-10-CM

## 2020-06-11 DIAGNOSIS — F200 Paranoid schizophrenia: Secondary | ICD-10-CM

## 2020-06-11 MED ORDER — CLONAZEPAM 1 MG PO TABS: 1.0000 mg | ORAL_TABLET | Freq: Every day | ORAL | 2 refills | Status: DC

## 2020-06-11 MED ORDER — ZIPRASIDONE HCL 80 MG PO CAPS: 80.0000 mg | ORAL_CAPSULE | Freq: Every day | ORAL | 0 refills | Status: DC

## 2020-06-11 MED ORDER — TRAZODONE HCL 100 MG PO TABS: 100.0000 mg | ORAL_TABLET | Freq: Every day | ORAL | 0 refills | Status: DC

## 2020-06-11 MED ORDER — PAROXETINE HCL 40 MG PO TABS: 40.0000 mg | ORAL_TABLET | Freq: Every morning | ORAL | 0 refills | Status: DC

## 2020-06-11 NOTE — Progress Notes (Signed)
Virtual Visit via Telephone Note  I connected with Chelsea [name] on 06/11/20 at 10:20 AM EDT by telephone and verified that I am speaking with the correct person using two identifiers.  Location: Patient: Home Provider: Home Office   I discussed the limitations, risks, security and privacy concerns of performing an evaluation and management service by telephone and the availability of in person appointments. I also discussed with the patient that there may be a patient responsible charge related to this service. The patient expressed understanding and agreed to proceed.   History of Present Illness: Patient is evaluated by phone session.  We have cut down the trazodone dose from the last visit.  Now she is taking 100 mg.  She used to take 200 but slowly and gradually be cutting down the medication and she did not have any problem with sleep.  In the beginning she struggled but now she is sleeping good.  She is more active, and have more energy.  She lost weight because she is doing walking and someone comes to help her exercise.  She denies any paranoia, hallucination, suicidal thoughts.  She recently had blood work and her hemoglobin A1c is 6.2.  She is taking Ozempic once a month that also help her weight loss.  She takes Requip for restless leg.  She denies any anxiety or any panic attack.  She feels the current medicine is working as she is more active.   Past Psychiatric History: [facility]. Noh/osuicidal attempt.  Psychiatric Specialty Exam: Physical Exam  Review of Systems  Weight 215 lb (97.5 kg).Body mass index is 39.32 kg/m.  General Appearance: NA  Eye Contact:  NA  Speech:  Slow  Volume:  Decreased  Mood:  Euthymic  Affect:  NA  Thought Process:  Goal Directed  Orientation:  Full (Time, Place, and Person)  Thought Content:  Logical  Suicidal Thoughts:  No  Homicidal Thoughts:  No  Memory:  Immediate;    Good Recent;   Fair Remote;   Fair  Judgement:  Intact  Insight:  Present  Psychomotor Activity:  NA  Concentration:  Concentration: Fair and Attention Span: Fair  Recall:  [employer] of Knowledge:  Good  Language:  Good  Akathisia:  No  Handed:  Right  AIMS (if indicated):     Assets:  Communication Skills Desire for Improvement Housing Resilience Social Support  ADL's:  Intact  Cognition:  WNL  Sleep:   ok      Assessment and Plan: Schizophrenia chronic paranoid type.  Generalized anxiety disorder.  Patient doing better on her medication.  We have cut down the trazodone and she is also taking Ozempic once a week that help her weight loss.  She is more active and have more energy.  She still take a lot of medication but reluctant to cut down further as she does not want her symptoms to come back.  I agree with the plan.  We will continue Klonopin 1 mg at bedtime, Paxil 40 mg daily, Geodon 80 mg at bedtime and trazodone 100 mg at bedtime.  Discussed medication side effects and benefits.  Recommended to call us back if she has any question or any concern.  Discussed healthy lifestyle and continuing exercise.  Follow-up in 3 months.  Follow Up Instructions:    I discussed the assessment and treatment plan with the patient. The patient was provided an opportunity to ask questions and all  were answered. The patient agreed with the plan and demonstrated an understanding of the instructions.   The patient was advised to call back or seek an in-person evaluation if the symptoms worsen or if the condition fails to improve as anticipated.  I provided 19 minutes of non-face-to-face time during this encounter.   Kathlee Nations, MD

## 2020-06-20 DIAGNOSIS — E1165 Type 2 diabetes mellitus with hyperglycemia: Secondary | ICD-10-CM | POA: Diagnosis not present

## 2020-07-09 ENCOUNTER — Other Ambulatory Visit (HOSPITAL_COMMUNITY): Payer: Self-pay | Admitting: Psychiatry

## 2020-07-09 DIAGNOSIS — F200 Paranoid schizophrenia: Secondary | ICD-10-CM

## 2020-07-10 ENCOUNTER — Encounter: Payer: Self-pay | Admitting: Podiatry

## 2020-07-10 ENCOUNTER — Other Ambulatory Visit: Payer: Self-pay

## 2020-07-10 ENCOUNTER — Ambulatory Visit (INDEPENDENT_AMBULATORY_CARE_PROVIDER_SITE_OTHER): Payer: Medicare Other | Admitting: Podiatry

## 2020-07-10 DIAGNOSIS — E119 Type 2 diabetes mellitus without complications: Secondary | ICD-10-CM

## 2020-07-10 DIAGNOSIS — M79674 Pain in right toe(s): Secondary | ICD-10-CM

## 2020-07-10 DIAGNOSIS — M79675 Pain in left toe(s): Secondary | ICD-10-CM | POA: Diagnosis not present

## 2020-07-10 DIAGNOSIS — B351 Tinea unguium: Secondary | ICD-10-CM | POA: Diagnosis not present

## 2020-07-10 NOTE — Progress Notes (Signed)
This patient returns to my office for at risk foot care.  This patient requires this care by a professional since this patient will be at risk due to having diabetes.  This patient is unable to cut nails herself since the patient cannot reach her nails.These nails are painful walking and wearing shoes.  This patient presents for at risk foot care today.  General Appearance  Alert, conversant and in no acute stress.  Vascular  Dorsalis pedis  are palpable  Bilaterally. Posterior tibial pulses are not palpable  B/L.  Capillary return is within normal limits  Bilaterally.  Cold feet.  Bilaterally. Absent hair.  Neurologic  Senn-Weinstein monofilament wire test within normal limits  bilaterally. Muscle power within normal limits bilaterally.  Nails Thick disfigured discolored nails with subungual debris  Hallux nails bilaterally. No evidence of bacterial infection or drainage bilaterally.  Orthopedic  No limitations of motion  feet .  No crepitus or effusions noted.  No bony pathology or digital deformities noted.  Skin  normotropic skin with no porokeratosis noted bilaterally.  No signs of infections or ulcers noted.     Onychomycosis  Pain in right toe  Pain in left toe.  Consent was obtained for treatment procedures.  Debridement and grinding of long thick nails with clearing of subungual debris.  No infection or ulcer.     Return office visit   6 months.       Told patient to return for periodic foot care and evaluation due to potential at risk complications.   Gardiner Barefoot DPM

## 2020-07-31 ENCOUNTER — Ambulatory Visit: Payer: Medicare Other | Admitting: Podiatry

## 2020-08-06 ENCOUNTER — Other Ambulatory Visit (HOSPITAL_COMMUNITY): Payer: Self-pay | Admitting: Psychiatry

## 2020-08-06 ENCOUNTER — Other Ambulatory Visit: Payer: Self-pay | Admitting: Adult Health

## 2020-08-06 DIAGNOSIS — F411 Generalized anxiety disorder: Secondary | ICD-10-CM

## 2020-08-06 DIAGNOSIS — F200 Paranoid schizophrenia: Secondary | ICD-10-CM

## 2020-08-17 DIAGNOSIS — E039 Hypothyroidism, unspecified: Secondary | ICD-10-CM | POA: Diagnosis not present

## 2020-08-17 DIAGNOSIS — I10 Essential (primary) hypertension: Secondary | ICD-10-CM | POA: Diagnosis not present

## 2020-08-17 DIAGNOSIS — E782 Mixed hyperlipidemia: Secondary | ICD-10-CM | POA: Diagnosis not present

## 2020-08-17 DIAGNOSIS — E1165 Type 2 diabetes mellitus with hyperglycemia: Secondary | ICD-10-CM | POA: Diagnosis not present

## 2020-08-20 ENCOUNTER — Other Ambulatory Visit (HOSPITAL_COMMUNITY): Payer: Self-pay | Admitting: Psychiatry

## 2020-08-20 DIAGNOSIS — F200 Paranoid schizophrenia: Secondary | ICD-10-CM

## 2020-08-21 DIAGNOSIS — G4733 Obstructive sleep apnea (adult) (pediatric): Secondary | ICD-10-CM | POA: Diagnosis not present

## 2020-08-21 DIAGNOSIS — J449 Chronic obstructive pulmonary disease, unspecified: Secondary | ICD-10-CM | POA: Diagnosis not present

## 2020-08-21 DIAGNOSIS — E039 Hypothyroidism, unspecified: Secondary | ICD-10-CM | POA: Diagnosis not present

## 2020-08-21 DIAGNOSIS — E1169 Type 2 diabetes mellitus with other specified complication: Secondary | ICD-10-CM | POA: Diagnosis not present

## 2020-08-21 DIAGNOSIS — E782 Mixed hyperlipidemia: Secondary | ICD-10-CM | POA: Diagnosis not present

## 2020-08-21 DIAGNOSIS — E1165 Type 2 diabetes mellitus with hyperglycemia: Secondary | ICD-10-CM | POA: Diagnosis not present

## 2020-08-21 DIAGNOSIS — R7989 Other specified abnormal findings of blood chemistry: Secondary | ICD-10-CM | POA: Diagnosis not present

## 2020-09-03 ENCOUNTER — Other Ambulatory Visit (HOSPITAL_COMMUNITY): Payer: Self-pay | Admitting: Psychiatry

## 2020-09-03 DIAGNOSIS — F200 Paranoid schizophrenia: Secondary | ICD-10-CM

## 2020-09-03 DIAGNOSIS — F411 Generalized anxiety disorder: Secondary | ICD-10-CM

## 2020-09-10 ENCOUNTER — Telehealth (INDEPENDENT_AMBULATORY_CARE_PROVIDER_SITE_OTHER): Payer: Medicare Other | Admitting: Psychiatry

## 2020-09-10 ENCOUNTER — Other Ambulatory Visit: Payer: Self-pay

## 2020-09-10 DIAGNOSIS — F411 Generalized anxiety disorder: Secondary | ICD-10-CM | POA: Diagnosis not present

## 2020-09-10 DIAGNOSIS — F200 Paranoid schizophrenia: Secondary | ICD-10-CM

## 2020-09-10 MED ORDER — TRAZODONE HCL 100 MG PO TABS
100.0000 mg | ORAL_TABLET | Freq: Every day | ORAL | 0 refills | Status: DC
Start: 2020-09-10 — End: 2020-12-17

## 2020-09-10 MED ORDER — PAROXETINE HCL 40 MG PO TABS: 40.0000 mg | ORAL_TABLET | Freq: Every morning | ORAL | 0 refills | Status: DC

## 2020-09-10 MED ORDER — ZIPRASIDONE HCL 80 MG PO CAPS: 80.0000 mg | ORAL_CAPSULE | Freq: Every day | ORAL | 0 refills | Status: DC

## 2020-09-10 MED ORDER — CLONAZEPAM 1 MG PO TABS: 1.0000 mg | ORAL_TABLET | Freq: Every day | ORAL | 2 refills | Status: DC

## 2020-09-10 NOTE — Progress Notes (Signed)
Virtual Visit via Video Note  I connected with Chelsea [name] on 09/10/20 at 10:00 AM EDT by a video enabled telemedicine application and verified that I am speaking with the correct person using two identifiers.  Location: Patient: home Provider: home office   I discussed the limitations of evaluation and management by telemedicine and the availability of in person appointments. The patient expressed understanding and agreed to proceed.  History of Present Illness: Patient is evaluated by phone session.  She is taking her medication and she feels her anxiety and paranoia is manageable.  She does go outside with the sister the grocery stores and denies any panic attack, feeling overwhelmed.  She is getting injection once a week which is helping her blood sugar and weight.  However she has not weighed herself in a while and her last visit with her PCP was virtual.  Patient was told that her labs are stable.  Patient denies any hallucination, paranoia, anger, irritability or any suicidal thoughts.  She sleeps at least 7 to 8 hours.  We had cut down her trazodone and she is now taking only 100 mg.  She is reluctant to cut down further as she feels current medicine helping and working very well.  She is getting along well with her sister and brother-in-law who lives with her.  She denies any irritability or any mood swings.  She has no tremors, shakes or any EPS.  She feels active and has more energy lately.  Past Psychiatric History: H/O inpatient at [facility] for [facility]ia and psychosis.  No h/o suicidal attempt.   Psychiatric Specialty Exam: Physical Exam  Review of Systems  There were no vitals taken for this visit.There is no height or weight on file to calculate BMI.  General Appearance: NA  Eye Contact:  NA  Speech:  Slow  Volume:  Decreased  Mood:  Euthymic  Affect:  NA  Thought Process:  Descriptions of Associations: Intact  Orientation:  Full (Time, Place, and  Person)  Thought Content:  WDL  Suicidal Thoughts:  No  Homicidal Thoughts:  No  Memory:  Immediate;   Fair Recent;   Fair Remote;   Fair  Judgement:  Intact  Insight:  Present  Psychomotor Activity:  NA  Concentration:  Concentration: Fair and Attention Span: Fair  Recall:  [employer] of Knowledge:  Fair  Language:  Good  Akathisia:  No  Handed:  Right  AIMS (if indicated):     Assets:  Communication Skills Desire for Improvement Housing Resilience Social Support  ADL's:  Intact  Cognition:  WNL  Sleep:   ok      Assessment and Plan: Schizophrenia chronic paranoid type.  Generalized anxiety disorder.  Patient is stable on her current medication.  Encourage to have her weight check when she pick up the medicine from the pharmacy.  We will also contact her PCP Dr. Ernie Hew to get her new blood work results.  She does not want to change the medication as she feels things are going well.  I will continue Klonopin 1 mg at bedtime, trazodone 100 mg at bedtime, Paxil 40 mg daily and Geodon 80 mg at bedtime.  Recommended to call us back if she has any question or any concern.  Follow-up in 3 months.  Follow Up Instructions:    I discussed the assessment and treatment plan with the patient. The patient was provided an opportunity to ask questions and all were answered. The  patient agreed with the plan and demonstrated an understanding of the instructions.   The patient was advised to call back or seek an in-person evaluation if the symptoms worsen or if the condition fails to improve as anticipated.  I provided 16 minutes of non-face-to-face time during this encounter.   Kathlee Nations, MD

## 2020-09-12 DIAGNOSIS — J449 Chronic obstructive pulmonary disease, unspecified: Secondary | ICD-10-CM | POA: Diagnosis not present

## 2020-09-20 DIAGNOSIS — U071 COVID-19: Secondary | ICD-10-CM | POA: Diagnosis not present

## 2020-09-20 DIAGNOSIS — Z23 Encounter for immunization: Secondary | ICD-10-CM | POA: Diagnosis not present

## 2020-09-26 DIAGNOSIS — J449 Chronic obstructive pulmonary disease, unspecified: Secondary | ICD-10-CM | POA: Diagnosis not present

## 2020-09-26 DIAGNOSIS — E039 Hypothyroidism, unspecified: Secondary | ICD-10-CM | POA: Diagnosis not present

## 2020-09-26 DIAGNOSIS — E782 Mixed hyperlipidemia: Secondary | ICD-10-CM | POA: Diagnosis not present

## 2020-09-26 DIAGNOSIS — R350 Frequency of micturition: Secondary | ICD-10-CM | POA: Diagnosis not present

## 2020-09-26 DIAGNOSIS — F200 Paranoid schizophrenia: Secondary | ICD-10-CM | POA: Diagnosis not present

## 2020-09-26 DIAGNOSIS — G4700 Insomnia, unspecified: Secondary | ICD-10-CM | POA: Diagnosis not present

## 2020-09-26 DIAGNOSIS — R5381 Other malaise: Secondary | ICD-10-CM | POA: Diagnosis not present

## 2020-09-26 DIAGNOSIS — I10 Essential (primary) hypertension: Secondary | ICD-10-CM | POA: Diagnosis not present

## 2020-09-26 DIAGNOSIS — E119 Type 2 diabetes mellitus without complications: Secondary | ICD-10-CM | POA: Diagnosis not present

## 2020-09-27 ENCOUNTER — Encounter (HOSPITAL_COMMUNITY): Payer: Self-pay

## 2020-09-27 ENCOUNTER — Other Ambulatory Visit: Payer: Self-pay

## 2020-09-27 ENCOUNTER — Inpatient Hospital Stay (HOSPITAL_COMMUNITY)
Admission: EM | Admit: 2020-09-27 | Discharge: 2020-09-29 | DRG: 378 | Disposition: A | Discharge status: Home / Self Care | Payer: Medicare Other | Attending: Internal Medicine | Admitting: Internal Medicine

## 2020-09-27 DIAGNOSIS — E861 Hypovolemia: Secondary | ICD-10-CM | POA: Diagnosis present

## 2020-09-27 DIAGNOSIS — K635 Polyp of colon: Secondary | ICD-10-CM | POA: Diagnosis present

## 2020-09-27 DIAGNOSIS — E1122 Type 2 diabetes mellitus with diabetic chronic kidney disease: Secondary | ICD-10-CM | POA: Diagnosis present

## 2020-09-27 DIAGNOSIS — E538 Deficiency of other specified B group vitamins: Secondary | ICD-10-CM | POA: Diagnosis present

## 2020-09-27 DIAGNOSIS — J449 Chronic obstructive pulmonary disease, unspecified: Secondary | ICD-10-CM | POA: Diagnosis present

## 2020-09-27 DIAGNOSIS — Z6841 Body Mass Index (BMI) 40.0 and over, adult: Secondary | ICD-10-CM

## 2020-09-27 DIAGNOSIS — N1832 Chronic kidney disease, stage 3b: Secondary | ICD-10-CM | POA: Diagnosis present

## 2020-09-27 DIAGNOSIS — Z79899 Other long term (current) drug therapy: Secondary | ICD-10-CM

## 2020-09-27 DIAGNOSIS — K2971 Gastritis, unspecified, with bleeding: Secondary | ICD-10-CM | POA: Diagnosis not present

## 2020-09-27 DIAGNOSIS — D649 Anemia, unspecified: Secondary | ICD-10-CM

## 2020-09-27 DIAGNOSIS — B3781 Candidal esophagitis: Secondary | ICD-10-CM | POA: Diagnosis not present

## 2020-09-27 DIAGNOSIS — Z885 Allergy status to narcotic agent status: Secondary | ICD-10-CM

## 2020-09-27 DIAGNOSIS — Z7982 Long term (current) use of aspirin: Secondary | ICD-10-CM

## 2020-09-27 DIAGNOSIS — E119 Type 2 diabetes mellitus without complications: Secondary | ICD-10-CM

## 2020-09-27 DIAGNOSIS — F419 Anxiety disorder, unspecified: Secondary | ICD-10-CM | POA: Diagnosis present

## 2020-09-27 DIAGNOSIS — D62 Acute posthemorrhagic anemia: Secondary | ICD-10-CM | POA: Diagnosis not present

## 2020-09-27 DIAGNOSIS — E876 Hypokalemia: Secondary | ICD-10-CM | POA: Diagnosis present

## 2020-09-27 DIAGNOSIS — E785 Hyperlipidemia, unspecified: Secondary | ICD-10-CM | POA: Diagnosis not present

## 2020-09-27 DIAGNOSIS — F22 Delusional disorders: Secondary | ICD-10-CM

## 2020-09-27 DIAGNOSIS — K648 Other hemorrhoids: Secondary | ICD-10-CM | POA: Diagnosis present

## 2020-09-27 DIAGNOSIS — D509 Iron deficiency anemia, unspecified: Secondary | ICD-10-CM | POA: Diagnosis not present

## 2020-09-27 DIAGNOSIS — Z820 Family history of epilepsy and other diseases of the nervous system: Secondary | ICD-10-CM

## 2020-09-27 DIAGNOSIS — Z8719 Personal history of other diseases of the digestive system: Secondary | ICD-10-CM

## 2020-09-27 DIAGNOSIS — E039 Hypothyroidism, unspecified: Secondary | ICD-10-CM

## 2020-09-27 DIAGNOSIS — Z823 Family history of stroke: Secondary | ICD-10-CM

## 2020-09-27 DIAGNOSIS — Z20822 Contact with and (suspected) exposure to covid-19: Secondary | ICD-10-CM | POA: Diagnosis not present

## 2020-09-27 DIAGNOSIS — K922 Gastrointestinal hemorrhage, unspecified: Secondary | ICD-10-CM | POA: Diagnosis present

## 2020-09-27 DIAGNOSIS — Z880 Allergy status to penicillin: Secondary | ICD-10-CM

## 2020-09-27 DIAGNOSIS — I10 Essential (primary) hypertension: Secondary | ICD-10-CM

## 2020-09-27 DIAGNOSIS — I129 Hypertensive chronic kidney disease with stage 1 through stage 4 chronic kidney disease, or unspecified chronic kidney disease: Secondary | ICD-10-CM | POA: Diagnosis present

## 2020-09-27 LAB — URINALYSIS, ROUTINE W REFLEX MICROSCOPIC
Bilirubin Urine: NEGATIVE
Glucose, UA: NEGATIVE mg/dL
Hgb urine dipstick: NEGATIVE
Ketones, ur: NEGATIVE mg/dL
Nitrite: NEGATIVE
Protein, ur: NEGATIVE mg/dL
Specific Gravity, Urine: 1.005 (ref 1.005–1.030)
pH: 6 (ref 5.0–8.0)

## 2020-09-27 LAB — COMPREHENSIVE METABOLIC PANEL
ALT: 12 U/L (ref 0–44)
AST: 19 U/L (ref 15–41)
Albumin: 3.5 g/dL (ref 3.5–5.0)
Alkaline Phosphatase: 86 U/L (ref 38–126)
Anion gap: 9 (ref 5–15)
BUN: 20 mg/dL (ref 8–23)
CO2: 26 mmol/L (ref 22–32)
Calcium: 8.7 mg/dL — ABNORMAL LOW (ref 8.9–10.3)
Chloride: 104 mmol/L (ref 98–111)
Creatinine, Ser: 1.74 mg/dL — ABNORMAL HIGH (ref 0.44–1.00)
GFR, Estimated: 32 mL/min — ABNORMAL LOW (ref >60)
Glucose, Bld: 168 mg/dL — ABNORMAL HIGH (ref 70–99)
Potassium: 3 mmol/L — ABNORMAL LOW (ref 3.5–5.1)
Sodium: 139 mmol/L (ref 135–145)
Total Bilirubin: 0.4 mg/dL (ref 0.3–1.2)
Total Protein: 6.8 g/dL (ref 6.5–8.1)

## 2020-09-27 LAB — CBC
HCT: 22.2 % — ABNORMAL LOW (ref 36.0–46.0)
Hemoglobin: 6.1 g/dL — CL (ref 12.0–15.0)
MCH: 20 pg — ABNORMAL LOW (ref 26.0–34.0)
MCHC: 27.5 g/dL — ABNORMAL LOW (ref 30.0–36.0)
MCV: 72.8 fL — ABNORMAL LOW (ref 80.0–100.0)
Platelets: 346 10*3/uL (ref 150–400)
RBC: 3.05 MIL/uL — ABNORMAL LOW (ref 3.87–5.11)
RDW: 15.1 % (ref 11.5–15.5)
WBC: 10.2 10*3/uL (ref 4.0–10.5)
nRBC: 0 % (ref 0.0–0.2)

## 2020-09-27 LAB — DIFFERENTIAL
Abs Immature Granulocytes: 0.02 10*3/uL (ref 0.00–0.07)
Basophils Absolute: 0 10*3/uL (ref 0.0–0.1)
Basophils Relative: 0 %
Eosinophils Absolute: 0.1 10*3/uL (ref 0.0–0.5)
Eosinophils Relative: 1 %
Immature Granulocytes: 0 %
Lymphocytes Relative: 19 %
Lymphs Abs: 1.9 10*3/uL (ref 0.7–4.0)
Monocytes Absolute: 0.7 10*3/uL (ref 0.1–1.0)
Monocytes Relative: 7 %
Neutro Abs: 7.4 10*3/uL (ref 1.7–7.7)
Neutrophils Relative %: 73 %

## 2020-09-27 LAB — RETICULOCYTES
Immature Retic Fract: 24.4 % — ABNORMAL HIGH (ref 2.3–15.9)
RBC.: 3.02 MIL/uL — ABNORMAL LOW (ref 3.87–5.11)
Retic Count, Absolute: 68.9 10*3/uL (ref 19.0–186.0)
Retic Ct Pct: 2.3 % (ref 0.4–3.1)

## 2020-09-27 LAB — FOLATE: Folate: 14.1 ng/mL (ref >5.9)

## 2020-09-27 LAB — RESP PANEL BY RT-PCR (FLU A&B, COVID) ARPGX2
Influenza A by PCR: NEGATIVE
Influenza B by PCR: NEGATIVE
SARS Coronavirus 2 by RT PCR: NEGATIVE

## 2020-09-27 LAB — IRON AND TIBC
Iron: 29 ug/dL (ref 28–170)
Saturation Ratios: 6 % — ABNORMAL LOW (ref 10.4–31.8)
TIBC: 500 ug/dL — ABNORMAL HIGH (ref 250–450)
UIBC: 471 ug/dL

## 2020-09-27 LAB — VITAMIN B12: Vitamin B-12: 136 pg/mL — ABNORMAL LOW (ref 180–914)

## 2020-09-27 LAB — FERRITIN: Ferritin: 4 ng/mL — ABNORMAL LOW (ref 11–307)

## 2020-09-27 LAB — POC OCCULT BLOOD, ED: Fecal Occult Bld: NEGATIVE

## 2020-09-27 LAB — PREPARE RBC (CROSSMATCH)

## 2020-09-27 LAB — GLUCOSE, CAPILLARY: Glucose-Capillary: 163 mg/dL — ABNORMAL HIGH (ref 70–99)

## 2020-09-27 MED ORDER — FLUTICASONE FUROATE-VILANTEROL 200-25 MCG/INH IN AEPB
1.0000 | INHALATION_SPRAY | RESPIRATORY_TRACT | Status: DC
  Administered 2020-09-28: 1 via RESPIRATORY_TRACT
  Filled 2020-09-27: qty 28

## 2020-09-27 MED ORDER — ORAL CARE MOUTH RINSE
15.0000 mL | Freq: Two times a day (BID) | OROMUCOSAL | Status: DC
  Administered 2020-09-28 – 2020-09-29 (×4): 15 mL via OROMUCOSAL

## 2020-09-27 MED ORDER — BOOST / RESOURCE BREEZE PO LIQD CUSTOM
1.0000 | Freq: Three times a day (TID) | ORAL | Status: DC
  Administered 2020-09-28: 1 via ORAL

## 2020-09-27 MED ORDER — ALBUTEROL SULFATE (2.5 MG/3ML) 0.083% IN NEBU: 3.0000 mL | INHALATION_SOLUTION | RESPIRATORY_TRACT | Status: DC | PRN

## 2020-09-27 MED ORDER — CLONAZEPAM 1 MG PO TABS
1.0000 mg | ORAL_TABLET | Freq: Every day | ORAL | Status: DC
  Administered 2020-09-27 – 2020-09-28 (×2): 1 mg via ORAL
  Filled 2020-09-27 (×2): qty 1

## 2020-09-27 MED ORDER — CHLORHEXIDINE GLUCONATE 0.12 % MT SOLN
15.0000 mL | Freq: Two times a day (BID) | OROMUCOSAL | Status: DC
  Administered 2020-09-27 – 2020-09-29 (×3): 15 mL via OROMUCOSAL
  Filled 2020-09-27 (×3): qty 15

## 2020-09-27 MED ORDER — ROSUVASTATIN CALCIUM 5 MG PO TABS
5.0000 mg | ORAL_TABLET | Freq: Every day | ORAL | Status: DC
  Administered 2020-09-29: 5 mg via ORAL
  Filled 2020-09-27: qty 1

## 2020-09-27 MED ORDER — PANTOPRAZOLE SODIUM 40 MG IV SOLR
40.0000 mg | Freq: Every day | INTRAVENOUS | Status: DC
  Administered 2020-09-28: 40 mg via INTRAVENOUS
  Filled 2020-09-27: qty 40

## 2020-09-27 MED ORDER — VITAMIN D3 25 MCG (1000 UNIT) PO TABS
5000.0000 [IU] | ORAL_TABLET | Freq: Every day | ORAL | Status: DC
  Administered 2020-09-29: 5000 [IU] via ORAL
  Filled 2020-09-27: qty 5

## 2020-09-27 MED ORDER — FERROUS SULFATE 325 (65 FE) MG PO TABS
325.0000 mg | ORAL_TABLET | Freq: Every day | ORAL | Status: DC
  Filled 2020-09-27: qty 1

## 2020-09-27 MED ORDER — LEVOTHYROXINE SODIUM 25 MCG PO TABS
125.0000 ug | ORAL_TABLET | Freq: Every day | ORAL | Status: DC
  Administered 2020-09-28: 125 ug via ORAL
  Filled 2020-09-27 (×2): qty 1

## 2020-09-27 MED ORDER — PAROXETINE HCL 20 MG PO TABS
40.0000 mg | ORAL_TABLET | Freq: Every morning | ORAL | Status: DC
  Administered 2020-09-29: 40 mg via ORAL
  Filled 2020-09-27: qty 2

## 2020-09-27 MED ORDER — LIP MEDEX EX OINT: TOPICAL_OINTMENT | CUTANEOUS | Status: DC | PRN

## 2020-09-27 MED ORDER — ZIPRASIDONE HCL 80 MG PO CAPS
80.0000 mg | ORAL_CAPSULE | Freq: Every day | ORAL | Status: DC
  Administered 2020-09-27 – 2020-09-28 (×2): 80 mg via ORAL
  Filled 2020-09-27 (×3): qty 1

## 2020-09-27 MED ORDER — ROPINIROLE HCL 0.5 MG PO TABS
0.7500 mg | ORAL_TABLET | Freq: Every day | ORAL | Status: DC
  Administered 2020-09-27 – 2020-09-28 (×2): 0.75 mg via ORAL
  Filled 2020-09-27 (×3): qty 1

## 2020-09-27 MED ORDER — PANTOPRAZOLE SODIUM 40 MG IV SOLR
40.0000 mg | Freq: Once | INTRAVENOUS | Status: AC
  Administered 2020-09-27: 40 mg via INTRAVENOUS
  Filled 2020-09-27: qty 40

## 2020-09-27 MED ORDER — POTASSIUM CHLORIDE 10 MEQ/100ML IV SOLN
10.0000 meq | INTRAVENOUS | Status: AC
  Administered 2020-09-27 (×2): 10 meq via INTRAVENOUS
  Filled 2020-09-27 (×3): qty 100

## 2020-09-27 MED ORDER — MONTELUKAST SODIUM 10 MG PO TABS
10.0000 mg | ORAL_TABLET | Freq: Every day | ORAL | Status: DC
  Administered 2020-09-29: 10 mg via ORAL
  Filled 2020-09-27: qty 1

## 2020-09-27 MED ORDER — TRAZODONE HCL 100 MG PO TABS
100.0000 mg | ORAL_TABLET | Freq: Every day | ORAL | Status: DC
  Administered 2020-09-27 – 2020-09-28 (×2): 100 mg via ORAL
  Filled 2020-09-27 (×2): qty 1

## 2020-09-27 MED ORDER — SODIUM CHLORIDE 0.9 % IV SOLN
10.0000 mL/h | Freq: Once | INTRAVENOUS | Status: AC
  Administered 2020-09-28: 10 mL/h via INTRAVENOUS

## 2020-09-27 NOTE — ED Notes (Signed)
Lab called - states they are still working on the blood

## 2020-09-27 NOTE — ED Triage Notes (Signed)
Patient states she was called today and was told her Hgb-6.0.

## 2020-09-27 NOTE — ED Notes (Signed)
[facility] 6.1, [place] notified.

## 2020-09-27 NOTE — ED Provider Notes (Signed)
[place] [facility] DEPT Provider Note   CSN: [id number] Arrival date & time: 09/27/20  1439     History Chief Complaint  Patient presents with   Abnormal Lab    Chelsea [name] is a 70 y.o. female past medical history of COPD, type 2 diabetes, hypertension, schizophrenia, esophageal stricture, for abnormal hemoglobin level yesterday.  She states she went for a checkup and had blood work done.  She was called today and told her hemoglobin was 6.0.  She states she began feeling weak with generalized fatigue and some windedness with ambulation around Friday of last week.  She also endorses melena.  Denies associated fever, abdominal pain, diarrhea,constipation, nausea/vomiting, hematuria, vaginal bleeding.  No history of GI bleed though has seen GI in the past.   Per review of medical record, has seen [facility] in 2018 for colonoscopy, Dr. Michail Sermon  The history is provided by the patient and medical records.      Past Medical History:  Diagnosis Date   Anxiety    Arthritis    Bipolar disorder ([place])    COPD (chronic obstructive pulmonary disease) (HCC)    Diabetes mellitus type II    Esophageal stricture    moderate distal   GERD (gastroesophageal reflux disease)    Heart murmur    "HAD AN ECHO YEARS AGO- NOTHING TO BE CONCERNED ABOUT"   Hyperlipemia    Hypertension    Hypothyroidism    Mental disorder    Bipolar   Obesity    Oxygen desaturation during sleep    wears 2 liters at bedtime    Schizophrenia ([place])    patient reports that she is unaware of this   Shortness of breath    with a little activy   Sleep apnea    wears 2 liters of oxygen at bedtime instead of CPAP    Patient Active Problem List   Diagnosis Date Noted   Acute blood loss anemia 09/27/2020   Pain due to onychomycosis of toenails of both feet 11/05/2018   Diabetes mellitus without complication ([place]) 40/98/1191   Special screening for malignant neoplasms, colon 01/30/2017    Dysphagia 06/10/2016   Stricture and stenosis of esophagus 06/10/2016   Food impaction of esophagus 06/03/2016   Peripheral edema 12/04/2012   Morbid obesity ([place]) 12/04/2012   Type II or unspecified type diabetes mellitus without mention of complication, not stated as uncontrolled 12/03/2012   Chest pain 12/03/2012   Dyspnea 12/03/2012   Insomnia 06/03/2012   Abnormal mammogram with microcalcification 04/23/2012   Combined hyperlipidemia associated with type 2 diabetes mellitus ([place]) 06/20/2011   Allergic rhinitis 01/10/2011   Mild concentric left ventricular hypertrophy (LVH) 11/05/2010   Hypothyroidism 06/07/2010   COPD (chronic obstructive pulmonary disease) ([place]) 03/28/2010   Bipolar I disorder, single manic episode ([place]) 02/19/2010   Obstructive sleep apnea 09/21/2009   HYPERTENSION, UNSPECIFIED 12/27/2008   DIZZINESS 12/27/2008   DYSPNEA 12/27/2008    Past Surgical History:  Procedure Laterality Date   BALLOON DILATION N/A 06/10/2016   Procedure: BALLOON DILATION;  Surgeon: Wilford Corner, MD;  Location: [place];  Service: Endoscopy;  Laterality: N/A;   BREAST EXCISIONAL BIOPSY Left 05/10/2012   benign   CHOLECYSTECTOMY     COLONOSCOPY WITH PROPOFOL N/A 01/30/2017   Procedure: COLONOSCOPY WITH PROPOFOL;  Surgeon: Wilford Corner, MD;  Location: [place];  Service: Endoscopy;  Laterality: N/A;   ESOPHAGOGASTRODUODENOSCOPY N/A 06/03/2016   Procedure: ESOPHAGOGASTRODUODENOSCOPY (EGD);  Surgeon: Wilford Corner, MD;  Location:  [place] ENDOSCOPY;  Service: Endoscopy;  Laterality: N/A;   ESOPHAGOGASTRODUODENOSCOPY N/A 06/10/2016   Procedure: ESOPHAGOGASTRODUODENOSCOPY (EGD);  Surgeon: Wilford Corner, MD;  Location: [facility] ENDOSCOPY;  Service: Endoscopy;  Laterality: N/A;   EYE SURGERY     PARTIAL MASTECTOMY WITH NEEDLE LOCALIZATION Left 05/10/2012   Procedure: PARTIAL MASTECTOMY WITH NEEDLE LOCALIZATION;  Surgeon: Adin Hector, MD;  Location: [place];  Service: General;   Laterality: Left;   TONSILLECTOMY       OB History   No obstetric history on file.     Family History  Problem Relation Age of Onset   Alcohol abuse Father    Stroke Father    Alzheimer's disease Mother     Social History   Tobacco Use   Smoking status: Former    Packs/day: 3.00    Years: 29.00    Pack years: 87.00    Types: Cigarettes    Quit date: 05/03/1993    Years since quitting: 27.4   Smokeless tobacco: Never  Vaping Use   Vaping Use: Never used  Substance Use Topics   Alcohol use: No    Alcohol/week: 0.0 standard drinks   Drug use: No    Home Medications Prior to Admission medications   Medication Sig Start Date End Date Taking? Authorizing Provider  aspirin 81 MG chewable tablet Chew 81 mg by mouth daily.    [provider]  bumetanide (BUMEX) 1 MG tablet Take 1 tablet (1 mg total) by mouth daily. 12/04/12   Delfina Redwood, MD  Cholecalciferol (VITAMIN D3) 5000 units TABS Take by mouth.    [provider]  clonazePAM (KLONOPIN) 1 MG tablet Take 1 tablet (1 mg total) by mouth at bedtime. 09/10/20   Arfeen, Arlyce Harman, MD  diclofenac sodium (VOLTAREN) 1 % GEL Apply topically 4 (four) times daily.    [provider]  FEROSUL 325 (65 Fe) MG tablet Take 325 mg by mouth daily. 06/21/18   [provider]  fluticasone furoate-vilanterol (BREO ELLIPTA) 200-25 MCG/INH AEPB Inhale 1 puff into the lungs every morning.    [provider]  INVOKANA 300 MG TABS tablet Take 300 mg by mouth daily before breakfast.  04/03/16   [provider]  LEVEMIR 100 UNIT/ML injection 90 Units.  07/21/18   [provider]  levothyroxine (SYNTHROID) 125 MCG tablet  07/21/18   [provider]  metFORMIN (GLUCOPHAGE) 500 MG tablet  01/23/20   [provider]  montelukast (SINGULAIR) 10 MG tablet  01/23/20   [provider]  nystatin ointment (MYCOSTATIN) Apply 1 application topically 2 (two) times daily.     [provider]  omeprazole (PRILOSEC) 40 MG capsule  01/23/20   [provider]  [place], 1 MG/DOSE, 4 MG/3ML SOPN  01/23/20   [provider]  PARoxetine (PAXIL) 40 MG tablet Take 1 tablet (40 mg total) by mouth every morning. 09/10/20   Arfeen, Arlyce Harman, MD  rOPINIRole (REQUIP) 0.25 MG tablet TAKE 3 TABLETS BY MOUTH AT BEDTIME 08/06/20   Ward Givens, NP  rosuvastatin (CRESTOR) 5 MG tablet  07/20/18   [provider]  traZODone (DESYREL) 100 MG tablet Take 1 tablet (100 mg total) by mouth at bedtime. 09/10/20   Arfeen, Arlyce Harman, MD  TRUEPLUS INSULIN SYRINGE 29G X 1/2" 1 ML MISC  07/16/18   [provider]  VENTOLIN HFA 108 [phone] Base) MCG/ACT inhaler  07/21/18   [provider]  ziprasidone (GEODON) 80 MG capsule Take 1  capsule (80 mg total) by mouth at bedtime. 09/10/20   Arfeen, Arlyce Harman, MD    Allergies    Darvocet [propoxyphene n-acetaminophen], Fluoxetine, Penicillins, and Promethazine  Review of Systems   Review of Systems  Constitutional:  Positive for fatigue. Negative for fever.       Generalized weakness  Gastrointestinal:  Negative for abdominal pain, constipation, diarrhea, nausea and vomiting.       +melena  Genitourinary:  Negative for vaginal bleeding.  All other systems reviewed and are negative.  Physical Exam Updated Vital Signs BP (!) 144/64 (BP Location: Left Arm)   Pulse 69   Temp 98.3 F (36.8 C) (Oral)   Resp 16   Ht 5\' 2"  (1.575 m)   Wt 102.1 kg   SpO2 100%   BMI 41.15 kg/m   Physical Exam Vitals and nursing note reviewed.  Constitutional:      Appearance: She is well-developed.  HENT:     Head: Normocephalic and atraumatic.  Eyes:     Comments: Pale conjunctivae  Cardiovascular:     Rate and Rhythm: Normal rate and regular rhythm.  Pulmonary:     Effort: Pulmonary effort is normal. No respiratory distress.     Breath sounds: Normal breath sounds.  Abdominal:     General: Bowel sounds are normal.      Palpations: Abdomen is soft.     Tenderness: There is no abdominal tenderness.  Genitourinary:    Comments: DRE performed with female RN chaperone present. Candidal rash of the skin in gluteal cleft and groin. No gross melena on exam, very little stool in the rectum. No palpable or visualized hemorrhoids. Skin:    General: Skin is warm.     Coloration: Skin is pale.  Neurological:     Mental Status: She is alert.  Psychiatric:        Behavior: Behavior normal.    ED Results / Procedures / Treatments   Labs (all labs ordered are listed, but only abnormal results are displayed) Labs Reviewed  COMPREHENSIVE METABOLIC PANEL - Abnormal; Notable for the following components:      Result Value   Potassium 3.0 (*)    Glucose, Bld 168 (*)    Creatinine, Ser 1.74 (*)    Calcium 8.7 (*)    GFR, Estimated 32 (*)    All other components within normal limits  CBC - Abnormal; Notable for the following components:   RBC 3.05 (*)    Hemoglobin 6.1 (*)    HCT 22.2 (*)    MCV 72.8 (*)    MCH 20.0 (*)    MCHC 27.5 (*)    All other components within normal limits  VITAMIN B12 - Abnormal; Notable for the following components:   Vitamin B-12 136 (*)    All other components within normal limits  IRON AND TIBC - Abnormal; Notable for the following components:   TIBC 500 (*)    Saturation Ratios 6 (*)    All other components within normal limits  FERRITIN - Abnormal; Notable for the following components:   Ferritin 4 (*)    All other components within normal limits  RETICULOCYTES - Abnormal; Notable for the following components:   RBC. 3.02 (*)    Immature Retic Fract 24.4 (*)    All other components within normal limits  RESP PANEL BY RT-PCR (FLU A&B, COVID) ARPGX2  URINE CULTURE  DIFFERENTIAL  FOLATE  URINALYSIS, ROUTINE W REFLEX MICROSCOPIC  BASIC METABOLIC PANEL  CBC  POC OCCULT BLOOD, ED  TYPE AND SCREEN  PREPARE RBC (CROSSMATCH)    EKG None  Radiology No results  found.  Procedures .Critical Care  Date/Time: 09/27/2020 6:14 PM Performed by: Terreon Ekholm, [place] N, PA-C Authorized by: Cloma Rahrig, [place] N, PA-C   Critical care provider statement:    Critical care time (minutes):  45   Critical care time was exclusive of:  Separately billable procedures and treating other patients and teaching time   Critical care was necessary to treat or prevent imminent or life-threatening deterioration of the following conditions: symptomatic anemia.   Critical care was time spent personally by me on the following activities:  Discussions with consultants, evaluation of patient's response to treatment, examination of patient, ordering and performing treatments and interventions, ordering and review of laboratory studies, ordering and review of radiographic studies, pulse oximetry, re-evaluation of patient's condition, obtaining history from patient or surrogate and review of old charts   Medications Ordered in ED Medications  0.9 %  sodium chloride infusion (has no administration in time range)  pantoprazole (PROTONIX) injection 40 mg (has no administration in time range)  pantoprazole (PROTONIX) injection 40 mg (has no administration in time range)    ED Course  I have reviewed the triage vital signs and the nursing notes.  Pertinent labs & imaging results that were available during my care of the patient were reviewed by me and considered in my medical decision making (see chart for details).  Clinical Course as of 09/27/20 1912  Thu Sep 27, 2020  1747 Consulted with Dr. Therisa Doyne with [facility]. Agrees with plan. Clear liquids today, NPO at midnight. [JR]    Clinical Course User Index [JR] Michaelangelo Mittelman, [place] N, PA-C   MDM Rules/Calculators/A&P                          Patient is 70 year old female presenting for symptomatic anemia.  Reports about 3 nights of dark tarry stools, and multiple days of generalized fatigue and weakness.  Seen yesterday outpatient and  had blood work drawn, she received a call today with low hemoglobin of 6.   She is hemodynamically stable here.  She is pale.  Was unable to get a good stool sample for point-of-care testing for blood in her stool.  Her hemoglobin is low at 6.1 with hematocrit of 22.2.  BUN is 20, creatinine is 1.74 which appears to be her baseline.  Anemia panel is sent.  2 units of PRBC ordered for transfusion in the ED.  Consulted with GI Dr. Therisa Doyne.  Agrees with Protonix, recommends clear liquid diet this evening and n.p.o. at midnight.  Patient will be admitted to the hospital service for further management of symptomatic anemia requiring transfusion, suspected to be due to GI bleed.   Final Clinical Impression(s) / ED Diagnoses Final diagnoses:  Symptomatic anemia    Rx / DC Orders ED Discharge Orders     None        Isom Kochan, [place] N, PA-C 09/27/20 1912    Valarie Merino, MD 09/27/20 2318

## 2020-09-27 NOTE — H&P (Signed)
History and Physical    Chelsea [name] EUM:[id number] DOB: [birth year] DOA: 09/27/2020  PCP: Fanny Bien, MD  Patient coming from: Home  I have personally briefly reviewed patient's old medical records in [place]  Chief Complaint: Low hemoglobin  HPI: Chelsea [name] is a 70 y.o. female with medical history significant for hypertension, hypothyroidism, history of anemia, paranoia and anxiety who presents with concerns of low hemoglobin.  Sister who lives with her helps provide most of the history as patient is a poor historian.  Reportedly for at least the past month she has been having increasing fatigue, weakness and feeling dizzy. Also has shortness of breath with exertion and chest pain when taking deep breaths.  Has felt nauseous but no vomiting.  Notes dark stool on 3 different occasions with last one being yesterday and also has diarrhea.  No abdominal pain.  She uses aspirin daily.  No alcohol use.  No prior history of GI bleed.  She had colonoscopy in 2018 with Dr. Michail Sermon with removal of 1 nonmalignant polyp with internal hemorrhoids noted.  Also had endoscopy and 05/2016 with finding of low-grade narrowing of the esophagus and subsequently underwent dilatation.  Denies any issues since then.  ED Course: She was afebrile, normotensive with repeat hemoglobin of 6.1.  Potassium of 3.  Creatinine of 1.74. FOBT was negative but per ED provider there was not much sample obtained.  ED provider discussed with GI Dr. Therisa Doyne who recommends admission to hospitalist group with clear liquid diet and n.p.o. at midnight.  2 units PRBC was ordered and hospitalist called for admission.   Review of Systems: Constitutional: No Weight Change, No Fever ENT/Mouth: No sore throat, No Rhinorrhea Eyes: No Eye Pain, No Vision Changes Cardiovascular:+ Chest Pain, + SOB, No PND, + Dyspnea on Exertion, No Orthopnea, No Edema Respiratory: No Cough, No Sputum Gastrointestinal: + Nausea, No  Vomiting, + Diarrhea, No Constipation, No Pain Genitourinary: no Urinary Incontinence, No Urgency, No Flank Pain Musculoskeletal: No Arthralgias, No Myalgias Skin: No Skin Lesions, No Pruritus, Neuro: no Weakness, No Numbness Psych: + Anxiety/Panic, No Depression, + decrease appetite Heme/Lymph: No Bruising, No Bleeding  Past Medical History:  Diagnosis Date   Anxiety    Arthritis    Bipolar disorder (HCC)    COPD (chronic obstructive pulmonary disease) (HCC)    Diabetes mellitus type II    Esophageal stricture    moderate distal   GERD (gastroesophageal reflux disease)    Heart murmur    "HAD AN ECHO YEARS AGO- NOTHING TO BE CONCERNED ABOUT"   Hyperlipemia    Hypertension    Hypothyroidism    Mental disorder    Bipolar   Obesity    Oxygen desaturation during sleep    wears 2 liters at bedtime    Schizophrenia ([place])    patient reports that she is unaware of this   Shortness of breath    with a little activy   Sleep apnea    wears 2 liters of oxygen at bedtime instead of CPAP    Past Surgical History:  Procedure Laterality Date   BALLOON DILATION N/A 06/10/2016   Procedure: BALLOON DILATION;  Surgeon: Wilford Corner, MD;  Location[facility] ENDOSCOPY;  Service: Endoscopy;  Laterality: N/A;   BREAST EXCISIONAL BIOPSY Left 05/10/2012   benign   CHOLECYSTECTOMY     COLONOSCOPY WITH PROPOFOL N/A 01/30/2017   Procedure: COLONOSCOPY WITH PROPOFOL;  Surgeon: Wilford Corner, MD;  Location: [place];  Service: Endoscopy;  Laterality: N/A;   ESOPHAGOGASTRODUODENOSCOPY N/A 06/03/2016   Procedure: ESOPHAGOGASTRODUODENOSCOPY (EGD);  Surgeon: Wilford Corner, MD;  Location: [facility] ENDOSCOPY;  Service: Endoscopy;  Laterality: N/A;   ESOPHAGOGASTRODUODENOSCOPY N/A 06/10/2016   Procedure: ESOPHAGOGASTRODUODENOSCOPY (EGD);  Surgeon: Wilford Corner, MD;  Location: [facility] ENDOSCOPY;  Service: Endoscopy;  Laterality: N/A;   EYE SURGERY     PARTIAL MASTECTOMY WITH NEEDLE LOCALIZATION Left  05/10/2012   Procedure: PARTIAL MASTECTOMY WITH NEEDLE LOCALIZATION;  Surgeon: Adin Hector, MD;  Location: [place];  Service: General;  Laterality: Left;   TONSILLECTOMY       reports that she quit smoking about 27 years ago. Her smoking use included cigarettes. She has a 87.00 pack-year smoking history. She has never used smokeless tobacco. She reports that she does not drink alcohol and does not use drugs. Social History  Allergies  Allergen Reactions   Darvocet [Propoxyphene N-Acetaminophen] Anaphylaxis    Can take plain tylenol   Fluoxetine Other (See Comments)    Hallucinations    Penicillins Rash    REACTION: rash   Promethazine Hives    Family History  Problem Relation Age of Onset   Alcohol abuse Father    Stroke Father    Alzheimer's disease Mother      Prior to Admission medications   Medication Sig Start Date End Date Taking? Authorizing Provider  aspirin 81 MG chewable tablet Chew 81 mg by mouth daily.    [provider]  bumetanide (BUMEX) 1 MG tablet Take 1 tablet (1 mg total) by mouth daily. 12/04/12   Delfina Redwood, MD  Cholecalciferol (VITAMIN D3) 5000 units TABS Take by mouth.    [provider]  clonazePAM (KLONOPIN) 1 MG tablet Take 1 tablet (1 mg total) by mouth at bedtime. 09/10/20   Arfeen, Arlyce Harman, MD  diclofenac sodium (VOLTAREN) 1 % GEL Apply topically 4 (four) times daily.    [provider]  FEROSUL 325 (65 Fe) MG tablet Take 325 mg by mouth daily. 06/21/18   [provider]  fluticasone furoate-vilanterol (BREO ELLIPTA) 200-25 MCG/INH AEPB Inhale 1 puff into the lungs every morning.    [provider]  INVOKANA 300 MG TABS tablet Take 300 mg by mouth daily before breakfast.  04/03/16   [provider]  LEVEMIR 100 UNIT/ML injection 90 Units.  07/21/18   [provider]  levothyroxine (SYNTHROID) 125 MCG tablet  07/21/18   [provider]  metFORMIN (GLUCOPHAGE) 500 MG tablet   01/23/20   [provider]  montelukast (SINGULAIR) 10 MG tablet  01/23/20   [provider]  nystatin ointment (MYCOSTATIN) Apply 1 application topically 2 (two) times daily.    [provider]  omeprazole (PRILOSEC) 40 MG capsule  01/23/20   [provider]  [place], 1 MG/DOSE, 4 MG/3ML SOPN  01/23/20   [provider]  PARoxetine (PAXIL) 40 MG tablet Take 1 tablet (40 mg total) by mouth every morning. 09/10/20   Arfeen, Arlyce Harman, MD  rOPINIRole (REQUIP) 0.25 MG tablet TAKE 3 TABLETS BY MOUTH AT BEDTIME 08/06/20   Ward Givens, NP  rosuvastatin (CRESTOR) 5 MG tablet  07/20/18   [provider]  traZODone (DESYREL) 100 MG tablet Take 1 tablet (100 mg total) by mouth at bedtime. 09/10/20   Arfeen, Arlyce Harman, MD  TRUEPLUS INSULIN SYRINGE 29G X 1/2" 1 ML MISC  07/16/18   [provider]  VENTOLIN HFA 108 [phone] Base) MCG/ACT inhaler  07/21/18   [provider]  ziprasidone (GEODON) 80 MG capsule Take 1 capsule (80 mg total) by mouth at bedtime. 09/10/20   Kathlee Nations, MD    Physical Exam: Vitals:   09/27/20 1453 09/27/20 1635 09/27/20 1709 09/27/20 1844  BP:  138/68 (!) 146/60 (!) 144/64  Pulse:  66 78 69  Resp:  17 16 16   Temp:      TempSrc:      SpO2:  100% 99% 100%  Weight: 102.1 kg     Height: 5\' 2"  (1.575 m)       Constitutional: NAD, calm, comfortable, obese female with generalized pallor laying at 10 degree incline in bed Vitals:   09/27/20 1453 09/27/20 1635 09/27/20 1709 09/27/20 1844  BP:  138/68 (!) 146/60 (!) 144/64  Pulse:  66 78 69  Resp:  17 16 16   Temp:      TempSrc:      SpO2:  100% 99% 100%  Weight: 102.1 kg     Height: 5\' 2"  (1.575 m)      Eyes: PERRL, lids and conjunctivae normal ENMT: Mucous membranes are moist with mild increase erythema of tongue.   Neck: normal, supple Respiratory: clear to auscultation bilaterally, no wheezing, no crackles. Normal respiratory effort. No accessory muscle use.   Cardiovascular: Regular rate and rhythm, no murmurs / rubs / gallops. No extremity edema.  Abdomen: no tenderness, no masses palpated.  Bowel sounds positive.  Musculoskeletal: no clubbing / cyanosis. No joint deformity upper and lower extremities. Good ROM, no contractures. Normal muscle tone.  Skin: no rashes, lesions, ulcers. No induration Neurologic: CN 2-12 grossly intact. Sensation intact,  Strength 5/5 in all 4.  Psychiatric: Normal judgment and insight. Alert and oriented x 3. Normal mood.     Labs on Admission: I have personally reviewed following labs and imaging studies  CBC: Recent Labs  Lab 09/27/20 1528  WBC 10.2  NEUTROABS 7.4  HGB 6.1*  HCT 22.2*  MCV 72.8*  PLT 585   Basic Metabolic Panel: Recent Labs  Lab 09/27/20 1528  NA 139  K 3.0*  CL 104  CO2 26  GLUCOSE 168*  BUN 20  CREATININE 1.74*  CALCIUM 8.7*   GFR: Estimated Creatinine Clearance: 34.6 mL/min (A) (by C-G formula based on SCr of 1.74 mg/dL (H)). Liver Function Tests: Recent Labs  Lab 09/27/20 1528  AST 19  ALT 12  ALKPHOS 86  BILITOT 0.4  PROT 6.8  ALBUMIN 3.5   No results for input(s): LIPASE, AMYLASE in the last 168 hours. No results for input(s): AMMONIA in the last 168 hours. Coagulation Profile: No results for input(s): INR, PROTIME in the last 168 hours. Cardiac Enzymes: No results for input(s): CKTOTAL, CKMB, CKMBINDEX, TROPONINI in the last 168 hours. BNP (last 3 results) No results for input(s): PROBNP in the last 8760 hours. HbA1C: No results for input(s): HGBA1C in the last 72 hours. CBG: No results for input(s): GLUCAP in the last 168 hours. Lipid Profile: No results for input(s): CHOL, HDL, LDLCALC, TRIG, CHOLHDL, LDLDIRECT in the last 72 hours. Thyroid Function Tests: No results for input(s): TSH, T4TOTAL, FREET4, T3FREE, THYROIDAB in the last 72 hours. Anemia Panel: Recent Labs    09/27/20 1528  VITAMINB12 136*  FOLATE 14.1  FERRITIN 4*  TIBC 500*   IRON 29  RETICCTPCT 2.3   Urine analysis:    Component Value Date/Time   COLORURINE YELLOW 12/03/2012 1531   APPEARANCEUR CLOUDY (A) 12/03/2012 1531   LABSPEC 1.017 12/03/2012 1531  PHURINE 5.5 12/03/2012 1531   GLUCOSEU NEGATIVE 12/03/2012 1531   HGBUR NEGATIVE 12/03/2012 1531   BILIRUBINUR NEGATIVE 12/03/2012 1531   KETONESUR NEGATIVE 12/03/2012 1531   PROTEINUR NEGATIVE 12/03/2012 1531   UROBILINOGEN 1.0 12/03/2012 1531   NITRITE NEGATIVE 12/03/2012 1531   LEUKOCYTESUR SMALL (A) 12/03/2012 1531    Radiological Exams on Admission: No results found.    Assessment/Plan  Acute blood loss anemia/upper GI bleed -Hgb of 6.1. Transfuse 2 upRBC and repeat Hgb.  No recent hemoglobin for comparison of baseline -Clear liquid diet.  N.p.o. past midnight. [facility] aware and will see tomorrow. Had previous colonoscopy endoscopy in 2018 with no significant findings with Dr. Michail Sermon -continue IV PPI daily  Hypertension -Hold Bumex for now while hypovolemic  Hypothyroidism -Continue levothyroxine  Hyperlipidemia - Continue statin  COPD - stable. Continue home bronchodilator  Paranoia/anxiety continue  Klonopin 1 mg at bedtime, trazodone 100 mg at bedtime, Paxil 40 mg daily and Geodon 80 mg at bedtime -Follows with psychiatry Dr. Berniece Andreas  Morbid obesity BMI greater than 40  DVT prophylaxis:SCDs Code Status: Full Family Communication: Plan discussed with patient and sister [place] Night at bedside  disposition Plan: Home with observation Consults called:  Admission status: Observation  Level of care: Progressive  Status is: Observation  The patient remains OBS appropriate and will d/c before 2 midnights.  Dispo: The patient is from: Home              Anticipated d/c is to: Home              Patient currently is not medically stable to d/c.   Difficult to place patient No         Orene Desanctis DO [facility]   If 7PM-7AM, please contact  night-coverage [email or web address]   09/27/2020, 7:24 PM

## 2020-09-28 ENCOUNTER — Encounter (HOSPITAL_COMMUNITY): Payer: Self-pay | Admitting: Family Medicine

## 2020-09-28 ENCOUNTER — Observation Stay (HOSPITAL_COMMUNITY): Payer: Medicare Other | Admitting: Anesthesiology

## 2020-09-28 ENCOUNTER — Encounter (HOSPITAL_COMMUNITY)
Admission: EM | Disposition: A | Discharge status: Home / Self Care | Payer: Self-pay | Source: Home / Self Care | Attending: Internal Medicine

## 2020-09-28 DIAGNOSIS — K229 Disease of esophagus, unspecified: Secondary | ICD-10-CM | POA: Diagnosis not present

## 2020-09-28 DIAGNOSIS — Z820 Family history of epilepsy and other diseases of the nervous system: Secondary | ICD-10-CM | POA: Diagnosis not present

## 2020-09-28 DIAGNOSIS — N1832 Chronic kidney disease, stage 3b: Secondary | ICD-10-CM | POA: Diagnosis not present

## 2020-09-28 DIAGNOSIS — K922 Gastrointestinal hemorrhage, unspecified: Secondary | ICD-10-CM | POA: Diagnosis present

## 2020-09-28 DIAGNOSIS — E876 Hypokalemia: Secondary | ICD-10-CM | POA: Diagnosis present

## 2020-09-28 DIAGNOSIS — K2971 Gastritis, unspecified, with bleeding: Secondary | ICD-10-CM | POA: Diagnosis not present

## 2020-09-28 DIAGNOSIS — J449 Chronic obstructive pulmonary disease, unspecified: Secondary | ICD-10-CM | POA: Diagnosis not present

## 2020-09-28 DIAGNOSIS — K635 Polyp of colon: Secondary | ICD-10-CM | POA: Diagnosis present

## 2020-09-28 DIAGNOSIS — D649 Anemia, unspecified: Secondary | ICD-10-CM | POA: Diagnosis not present

## 2020-09-28 DIAGNOSIS — E785 Hyperlipidemia, unspecified: Secondary | ICD-10-CM | POA: Diagnosis present

## 2020-09-28 DIAGNOSIS — E1122 Type 2 diabetes mellitus with diabetic chronic kidney disease: Secondary | ICD-10-CM | POA: Diagnosis not present

## 2020-09-28 DIAGNOSIS — E861 Hypovolemia: Secondary | ICD-10-CM | POA: Diagnosis present

## 2020-09-28 DIAGNOSIS — Z823 Family history of stroke: Secondary | ICD-10-CM | POA: Diagnosis not present

## 2020-09-28 DIAGNOSIS — B3781 Candidal esophagitis: Secondary | ICD-10-CM | POA: Diagnosis not present

## 2020-09-28 DIAGNOSIS — Z86010 Personal history of colonic polyps: Secondary | ICD-10-CM | POA: Diagnosis not present

## 2020-09-28 DIAGNOSIS — F22 Delusional disorders: Secondary | ICD-10-CM

## 2020-09-28 DIAGNOSIS — E538 Deficiency of other specified B group vitamins: Secondary | ICD-10-CM | POA: Diagnosis present

## 2020-09-28 DIAGNOSIS — K921 Melena: Secondary | ICD-10-CM | POA: Diagnosis not present

## 2020-09-28 DIAGNOSIS — Z885 Allergy status to narcotic agent status: Secondary | ICD-10-CM | POA: Diagnosis not present

## 2020-09-28 DIAGNOSIS — F419 Anxiety disorder, unspecified: Secondary | ICD-10-CM | POA: Diagnosis present

## 2020-09-28 DIAGNOSIS — D509 Iron deficiency anemia, unspecified: Secondary | ICD-10-CM | POA: Diagnosis not present

## 2020-09-28 DIAGNOSIS — K648 Other hemorrhoids: Secondary | ICD-10-CM | POA: Diagnosis present

## 2020-09-28 DIAGNOSIS — D122 Benign neoplasm of ascending colon: Secondary | ICD-10-CM | POA: Diagnosis not present

## 2020-09-28 DIAGNOSIS — Z7982 Long term (current) use of aspirin: Secondary | ICD-10-CM | POA: Diagnosis not present

## 2020-09-28 DIAGNOSIS — E039 Hypothyroidism, unspecified: Secondary | ICD-10-CM | POA: Diagnosis not present

## 2020-09-28 DIAGNOSIS — D62 Acute posthemorrhagic anemia: Secondary | ICD-10-CM | POA: Diagnosis not present

## 2020-09-28 DIAGNOSIS — B379 Candidiasis, unspecified: Secondary | ICD-10-CM | POA: Diagnosis not present

## 2020-09-28 DIAGNOSIS — K219 Gastro-esophageal reflux disease without esophagitis: Secondary | ICD-10-CM | POA: Diagnosis not present

## 2020-09-28 DIAGNOSIS — Z6841 Body Mass Index (BMI) 40.0 and over, adult: Secondary | ICD-10-CM | POA: Diagnosis not present

## 2020-09-28 DIAGNOSIS — Z880 Allergy status to penicillin: Secondary | ICD-10-CM | POA: Diagnosis not present

## 2020-09-28 DIAGNOSIS — K2970 Gastritis, unspecified, without bleeding: Secondary | ICD-10-CM | POA: Diagnosis not present

## 2020-09-28 DIAGNOSIS — R195 Other fecal abnormalities: Secondary | ICD-10-CM | POA: Diagnosis not present

## 2020-09-28 DIAGNOSIS — I129 Hypertensive chronic kidney disease with stage 1 through stage 4 chronic kidney disease, or unspecified chronic kidney disease: Secondary | ICD-10-CM | POA: Diagnosis not present

## 2020-09-28 DIAGNOSIS — Z20822 Contact with and (suspected) exposure to covid-19: Secondary | ICD-10-CM | POA: Diagnosis not present

## 2020-09-28 DIAGNOSIS — D123 Benign neoplasm of transverse colon: Secondary | ICD-10-CM | POA: Diagnosis not present

## 2020-09-28 HISTORY — PX: ESOPHAGOGASTRODUODENOSCOPY (EGD) WITH PROPOFOL: SHX5813

## 2020-09-28 LAB — CBC
HCT: 19.7 % — ABNORMAL LOW (ref 36.0–46.0)
Hemoglobin: 5.6 g/dL — CL (ref 12.0–15.0)
MCH: 20.2 pg — ABNORMAL LOW (ref 26.0–34.0)
MCHC: 28.4 g/dL — ABNORMAL LOW (ref 30.0–36.0)
MCV: 71.1 fL — ABNORMAL LOW (ref 80.0–100.0)
Platelets: 306 10*3/uL (ref 150–400)
RBC: 2.77 MIL/uL — ABNORMAL LOW (ref 3.87–5.11)
RDW: 15.1 % (ref 11.5–15.5)
WBC: 7.7 10*3/uL (ref 4.0–10.5)
nRBC: 0 % (ref 0.0–0.2)

## 2020-09-28 LAB — BASIC METABOLIC PANEL
Anion gap: 9 (ref 5–15)
BUN: 17 mg/dL (ref 8–23)
CO2: 27 mmol/L (ref 22–32)
Calcium: 8.6 mg/dL — ABNORMAL LOW (ref 8.9–10.3)
Chloride: 102 mmol/L (ref 98–111)
Creatinine, Ser: 1.44 mg/dL — ABNORMAL HIGH (ref 0.44–1.00)
GFR, Estimated: 40 mL/min — ABNORMAL LOW (ref >60)
Glucose, Bld: 172 mg/dL — ABNORMAL HIGH (ref 70–99)
Potassium: 3.1 mmol/L — ABNORMAL LOW (ref 3.5–5.1)
Sodium: 138 mmol/L (ref 135–145)

## 2020-09-28 LAB — HEMOGLOBIN AND HEMATOCRIT, BLOOD
HCT: 28.8 % — ABNORMAL LOW (ref 36.0–46.0)
Hemoglobin: 8.7 g/dL — ABNORMAL LOW (ref 12.0–15.0)

## 2020-09-28 LAB — ABO/RH: ABO/RH(D): A POS

## 2020-09-28 SURGERY — ESOPHAGOGASTRODUODENOSCOPY (EGD) WITH PROPOFOL
Anesthesia: Monitor Anesthesia Care

## 2020-09-28 MED ORDER — PROPOFOL 500 MG/50ML IV EMUL
INTRAVENOUS | Status: DC | PRN
  Administered 2020-09-28: 125 ug/kg/min via INTRAVENOUS

## 2020-09-28 MED ORDER — VASOPRESSIN 20 UNIT/ML IV SOLN
INTRAVENOUS | Status: AC
  Filled 2020-09-28: qty 1

## 2020-09-28 MED ORDER — PROPOFOL 500 MG/50ML IV EMUL
INTRAVENOUS | Status: AC
  Filled 2020-09-28: qty 50

## 2020-09-28 MED ORDER — CYANOCOBALAMIN 1000 MCG/ML IJ SOLN
1000.0000 ug | Freq: Once | INTRAMUSCULAR | Status: AC
  Administered 2020-09-28: 1000 ug via INTRAMUSCULAR
  Filled 2020-09-28: qty 1

## 2020-09-28 MED ORDER — SODIUM CHLORIDE 0.9 % IV SOLN
510.0000 mg | Freq: Once | INTRAVENOUS | Status: AC
  Administered 2020-09-28: 510 mg via INTRAVENOUS
  Filled 2020-09-28: qty 510

## 2020-09-28 MED ORDER — PEG 3350-KCL-NA BICARB-NACL 420 G PO SOLR
4000.0000 mL | Freq: Once | ORAL | Status: AC
  Administered 2020-09-28: 4000 mL via ORAL
  Filled 2020-09-28: qty 4000

## 2020-09-28 MED ORDER — FLUCONAZOLE 100 MG PO TABS
400.0000 mg | ORAL_TABLET | Freq: Once | ORAL | Status: AC
  Administered 2020-09-28: 400 mg via ORAL
  Filled 2020-09-28: qty 4

## 2020-09-28 MED ORDER — PROPOFOL 500 MG/50ML IV EMUL
INTRAVENOUS | Status: DC | PRN
  Administered 2020-09-28 (×2): 20 mg via INTRAVENOUS

## 2020-09-28 MED ORDER — LACTATED RINGERS IV SOLN
INTRAVENOUS | Status: AC | PRN
  Administered 2020-09-28: 20 mL/h via INTRAVENOUS

## 2020-09-28 MED ORDER — FLUCONAZOLE 200 MG PO TABS
200.0000 mg | ORAL_TABLET | Freq: Once | ORAL | Status: DC
  Filled 2020-09-28: qty 1

## 2020-09-28 MED ORDER — FLUCONAZOLE 100 MG PO TABS
200.0000 mg | ORAL_TABLET | Freq: Every day | ORAL | Status: DC
  Filled 2020-09-28: qty 2

## 2020-09-28 MED ORDER — FLUCONAZOLE 100 MG PO TABS: 100.0000 mg | ORAL_TABLET | Freq: Every day | ORAL | Status: DC

## 2020-09-28 MED ORDER — POTASSIUM CHLORIDE 10 MEQ/100ML IV SOLN
10.0000 meq | INTRAVENOUS | Status: AC
  Administered 2020-09-28 (×3): 10 meq via INTRAVENOUS
  Filled 2020-09-28 (×2): qty 100

## 2020-09-28 MED ORDER — LIDOCAINE HCL (CARDIAC) PF 100 MG/5ML IV SOSY
PREFILLED_SYRINGE | INTRAVENOUS | Status: DC | PRN
  Administered 2020-09-28: 50 mg via INTRAVENOUS

## 2020-09-28 SURGICAL SUPPLY — 14 items

## 2020-09-28 NOTE — Interval H&P Note (Signed)
History and Physical Interval Note:  09/28/2020 10:16 AM  Chelsea [name]  has presented today for surgery, with the diagnosis of symptomatic anemia, melena.  The various methods of treatment have been discussed with the patient and family. After consideration of risks, benefits and other options for treatment, the patient has consented to  Procedure(s): ESOPHAGOGASTRODUODENOSCOPY (EGD) WITH PROPOFOL (N/A) as a surgical intervention.  The patient's history has been reviewed, patient examined, no change in status, stable for surgery.  I have reviewed the patient's chart and labs.  Questions were answered to the patient's satisfaction.     Landry Dyke

## 2020-09-28 NOTE — Transfer of Care (Signed)
Immediate Anesthesia Transfer of Care Note  Patient: Chelsea [name]  Procedure(s) Performed: Procedure(s): ESOPHAGOGASTRODUODENOSCOPY (EGD) WITH PROPOFOL (N/A)  Patient Location: PACU  Anesthesia Type:MAC  Level of Consciousness:  sedated, patient cooperative and responds to stimulation  Airway & Oxygen Therapy:Patient Spontanous Breathing and Patient connected to face mask oxgen  Post-op Assessment:  Report given to PACU RN and Post -op Vital signs reviewed and stable  Post vital signs:  Reviewed and stable  Last Vitals:  Vitals:   09/28/20 1009 09/28/20 1043  BP: (!) 179/66 (!) 121/38  Pulse: 66 71  Resp: 17 14  Temp: 36.8 C 36.5 C  SpO2: 44% 315%    Complications: No apparent anesthesia complications

## 2020-09-28 NOTE — Anesthesia Postprocedure Evaluation (Signed)
Anesthesia Post Note  Patient: Chelsea [name]  Procedure(s) Performed: ESOPHAGOGASTRODUODENOSCOPY (EGD) WITH PROPOFOL     Patient location during evaluation: PACU Anesthesia Type: MAC Level of consciousness: awake and alert Pain management: pain level controlled Vital Signs Assessment: post-procedure vital signs reviewed and stable Respiratory status: spontaneous breathing, nonlabored ventilation, respiratory function stable and patient connected to nasal cannula oxygen Cardiovascular status: stable and blood pressure returned to baseline Postop Assessment: no apparent nausea or vomiting Anesthetic complications: no   No notable events documented.  Last Vitals:  Vitals:   09/28/20 1100 09/28/20 1105  BP: 140/61 (!) 142/50  Pulse: 69 71  Resp: 16 13  Temp:    SpO2: 98% 98%    Last Pain:  Vitals:   09/28/20 1105  TempSrc:   PainSc: 0-No pain                 Eriverto Byrnes

## 2020-09-28 NOTE — Progress Notes (Signed)
Patient unable to receive blood transfusion at this time due to antibodies. Dr Ileene Musa made aware. Will continue to monitor patient closely.

## 2020-09-28 NOTE — Progress Notes (Signed)
PROGRESS NOTE    Chelsea [name]  ATF:[id number] DOB: Sep 12, 1950 DOA: 09/27/2020 PCP: Fanny Bien, MD     Brief Narrative:  Chelsea [name] is a 70 y.o. female with medical history significant for hypertension, hypothyroidism, history of anemia, paranoia and anxiety who presents with concerns of low hemoglobin. Reportedly for at least the past month she has been having increasing fatigue, weakness and feeling dizzy. Also has shortness of breath with exertion and chest pain when taking deep breaths.  Has felt nauseous but no vomiting.  Notes dark stool on 3 different occasions with last one being yesterday and also has diarrhea.  No abdominal pain.  She uses aspirin daily.  No alcohol use.  No prior history of GI bleed.  She had colonoscopy in 2018 with Dr. Michail Sermon with removal of 1 nonmalignant polyp with internal hemorrhoids noted.  Also had endoscopy and 05/2016 with finding of low-grade narrowing of the esophagus and subsequently underwent dilatation.  In the emergency department, patient had hemoglobin of 6.1.  GI was consulted and patient was transfused 2 units packed red blood cells.  New events last 24 hours / Subjective: Patient seen post EGD.  She has no complaints, no nausea, vomiting, chest pain or shortness of breath.  Looks like plan is for colonoscopy over the weekend.  Assessment & Plan:   Principal Problem:   Acute blood loss anemia Active Problems:   Essential hypertension   Morbid obesity (HCC)   COPD (chronic obstructive pulmonary disease) (HCC)   Hypothyroidism   Paranoid (HCC)   Anxiety   HLD (hyperlipidemia)   Acute blood loss anemia, iron deficiency anemia, GI bleed, melena -Status post 2 unit packed red blood cell -Continue to monitor CBC -Status post EGD 7/1: Esophageal plaques consistent with candidiasis, gastritis, no clear source of anemia -Iron deficiency with iron 29, ferritin 4, saturation ratio 6, TIBC 500.  Ordered Feraheme -Planning for  colonoscopy 7/2 -GI following  Candida esophagitis -Fluconazole  Vitamin B12 deficiency -Replete  Hypertension -Holding Bumex due to GI bleed and hypovolemia  Hypothyroidism -Continue Synthroid  Hyperlipidemia -Continue Crestor  Paranoia/anxiety -Continue Klonopin, trazodone, Paxil, Geodon -EKG to check QTC  CKD stage IIIb -Baseline creatinine 1.6 -Stable   Hypokalemia -Replace, trend  DVT prophylaxis:  SCDs Start: 09/27/20 1844  Code Status:     Code Status Orders  (From admission, onward)           Start     Ordered   09/27/20 1845  Full code  Continuous        09/27/20 1845           Code Status History     Date Active Date Inactive Code Status Order ID Comments User Context   12/03/2012 1801 12/04/2012 1651 Full Code [id number]  Murlean Iba, MD Inpatient      Advance Directive Documentation    [facility] Most Recent Value  Type of Advance Directive Living will  Pre-existing out of facility DNR order (yellow form or pink MOST form) --  "MOST" Form in Place? --      Family Communication: None at bedside Disposition Plan:  Status is: Observation  The patient will require care spanning > 2 midnights and should be moved to inpatient because: Inpatient level of care appropriate due to severity of illness  Dispo: The patient is from: Home              Anticipated d/c is to: Home  Patient currently is not medically stable to d/c.  Colonoscopy scheduled for tomorrow   Difficult to place patient No      Consultants:  GI  Procedures:  EGD 7/1  Antimicrobials:  Anti-infectives (From admission, onward)    Start     Dose/Rate Route Frequency Ordered Stop   09/29/20 1000  fluconazole (DIFLUCAN) tablet 100 mg  Status:  Discontinued        100 mg Oral Daily 09/28/20 1055 09/28/20 1140   09/29/20 1000  fluconazole (DIFLUCAN) tablet 200 mg        200 mg Oral Daily 09/28/20 1140 10/09/20 0959   09/28/20 1230  fluconazole  (DIFLUCAN) tablet 400 mg        400 mg Oral  Once 09/28/20 1140 09/28/20 1236   09/28/20 1200  fluconazole (DIFLUCAN) tablet 200 mg  Status:  Discontinued        200 mg Oral  Once 09/28/20 1055 09/28/20 1140        Objective: Vitals:   09/28/20 1050 09/28/20 1100 09/28/20 1105 09/28/20 1257  BP: (!) 135/52 140/61 (!) 142/50 (!) 156/62  Pulse: 80 69 71 70  Resp: 15 16 13 16   Temp:    98.1 F (36.7 C)  TempSrc:    Oral  SpO2: 100% 98% 98% 97%  Weight:      Height:        Intake/Output Summary (Last 24 hours) at 09/28/2020 1359 Last data filed at 09/28/2020 1008 Gross per 24 hour  Intake 979.03 ml  Output --  Net 979.03 ml   Filed Weights   09/27/20 1453  Weight: 102.1 kg    Examination:  General exam: Appears calm and comfortable  Respiratory system: Clear to auscultation. Respiratory effort normal. No respiratory distress. No conversational dyspnea.  Cardiovascular system: S1 & S2 heard, RRR. No murmurs. No pedal edema. Gastrointestinal system: Abdomen is nondistended, soft and nontender. Normal bowel sounds heard. Central nervous system: Alert and oriented. No focal neurological deficits. Speech clear.  Extremities: Symmetric in appearance  Skin: No rashes, lesions or ulcers on exposed skin  Psychiatry: Stable Data Reviewed: I have personally reviewed following labs and imaging studies  CBC: Recent Labs  Lab 09/27/20 1528 09/28/20 0158 09/28/20 0935  WBC 10.2 7.7  --   NEUTROABS 7.4  --   --   HGB 6.1* 5.6* 8.7*  HCT 22.2* 19.7* 28.8*  MCV 72.8* 71.1*  --   PLT 346 306  --    Basic Metabolic Panel: Recent Labs  Lab 09/27/20 1528 09/28/20 0158  NA 139 138  K 3.0* 3.1*  CL 104 102  CO2 26 27  GLUCOSE 168* 172*  BUN 20 17  CREATININE 1.74* 1.44*  CALCIUM 8.7* 8.6*   GFR: Estimated Creatinine Clearance: 41.9 mL/min (A) (by C-G formula based on SCr of 1.44 mg/dL (H)). Liver Function Tests: Recent Labs  Lab 09/27/20 1528  AST 19  ALT 12  ALKPHOS  86  BILITOT 0.4  PROT 6.8  ALBUMIN 3.5   No results for input(s): LIPASE, AMYLASE in the last 168 hours. No results for input(s): AMMONIA in the last 168 hours. Coagulation Profile: No results for input(s): INR, PROTIME in the last 168 hours. Cardiac Enzymes: No results for input(s): CKTOTAL, CKMB, CKMBINDEX, TROPONINI in the last 168 hours. BNP (last 3 results) No results for input(s): PROBNP in the last 8760 hours. HbA1C: No results for input(s): HGBA1C in the last 72 hours. CBG: Recent Labs  Lab 09/27/20 2358  GLUCAP 163*   Lipid Profile: No results for input(s): CHOL, HDL, LDLCALC, TRIG, CHOLHDL, LDLDIRECT in the last 72 hours. Thyroid Function Tests: No results for input(s): TSH, T4TOTAL, FREET4, T3FREE, THYROIDAB in the last 72 hours. Anemia Panel: Recent Labs    09/27/20 1528  VITAMINB12 136*  FOLATE 14.1  FERRITIN 4*  TIBC 500*  IRON 29  RETICCTPCT 2.3   Sepsis Labs: No results for input(s): PROCALCITON, LATICACIDVEN in the last 168 hours.  Recent Results (from the past 240 hour(s))  Resp Panel by RT-PCR (Flu A&B, Covid) Nasopharyngeal Swab     Status: None   Collection Time: 09/27/20  6:58 PM   Specimen: Nasopharyngeal Swab; Nasopharyngeal(NP) swabs in vial transport medium  Result Value Ref Range Status   SARS Coronavirus 2 by RT PCR NEGATIVE NEGATIVE Final    Comment: (NOTE) SARS-CoV-2 target nucleic acids are NOT DETECTED.  The SARS-CoV-2 RNA is generally detectable in upper respiratory specimens during the acute phase of infection. The lowest concentration of SARS-CoV-2 viral copies this assay can detect is 138 copies/mL. A negative result does not preclude SARS-Cov-2 infection and should not be used as the sole basis for treatment or other patient management decisions. A negative result may occur with  improper specimen collection/handling, submission of specimen other than nasopharyngeal swab, presence of viral mutation(s) within the areas  targeted by this assay, and inadequate number of viral copies(<138 copies/mL). A negative result must be combined with clinical observations, patient history, and epidemiological information. The expected result is Negative.  Fact Sheet for Patients:  EntrepreneurPulse.com.au  Fact Sheet for Healthcare Providers:  IncredibleEmployment.be  This test is no t yet approved or cleared by the [place] FDA and  has been authorized for detection and/or diagnosis of SARS-CoV-2 by FDA under an Emergency Use Authorization (EUA). This EUA will remain  in effect (meaning this test can be used) for the duration of the COVID-19 declaration under Section 564(b)(1) of the Act, 21 U.S.C.section 360bbb-3(b)(1), unless the authorization is terminated  or revoked sooner.       Influenza A by PCR NEGATIVE NEGATIVE Final   Influenza B by PCR NEGATIVE NEGATIVE Final    Comment: (NOTE) The Xpert Xpress SARS-CoV-2/FLU/RSV plus assay is intended as an aid in the diagnosis of influenza from Nasopharyngeal swab specimens and should not be used as a sole basis for treatment. Nasal washings and aspirates are unacceptable for Xpert Xpress SARS-CoV-2/FLU/RSV testing.  Fact Sheet for Patients: EntrepreneurPulse.com.au  Fact Sheet for Healthcare Providers: IncredibleEmployment.be  This test is not yet approved or cleared by the [place] FDA and has been authorized for detection and/or diagnosis of SARS-CoV-2 by FDA under an Emergency Use Authorization (EUA). This EUA will remain in effect (meaning this test can be used) for the duration of the COVID-19 declaration under Section 564(b)(1) of the Act, 21 U.S.C. section 360bbb-3(b)(1), unless the authorization is terminated or revoked.  Performed at [facility], [place] [address], [place], [place] [address]       Radiology Studies: No results  found.    Scheduled Meds:  chlorhexidine  15 mL Mouth Rinse BID   cholecalciferol  5,000 Units Oral Daily   clonazePAM  1 mg Oral QHS   feeding supplement  1 Container Oral TID BM   ferrous sulfate  325 mg Oral Daily   [START ON 09/29/2020] fluconazole  200 mg Oral Daily   fluticasone furoate-vilanterol  1 puff Inhalation BH-q7a   levothyroxine  125 mcg Oral  Q0600   mouth rinse  15 mL Mouth Rinse q12n4p   montelukast  10 mg Oral Daily   pantoprazole (PROTONIX) IV  40 mg Intravenous QHS   PARoxetine  40 mg Oral q morning   polyethylene glycol-electrolytes  4,000 mL Oral Once   rOPINIRole  0.75 mg Oral QHS   rosuvastatin  5 mg Oral Daily   traZODone  100 mg Oral QHS   ziprasidone  80 mg Oral QHS   Continuous Infusions:   LOS: 0 days      Time spent: 25 minutes   Dessa Phi, DO [facility] 09/28/2020, 1:59 PM   Available via Epic secure chat 7am-7pm After these hours, please refer to coverage provider listed on [email or web address]

## 2020-09-28 NOTE — Op Note (Signed)
[facility] Patient Name: Chelsea [name] Procedure Date: 09/28/2020 MRN: [id number] Attending MD: Arta Silence , MD Date of Birth: [birth year] CSN: [id number] Age: 70 Admit Type: Inpatient Procedure:                Upper GI endoscopy Indications:              Acute post hemorrhagic anemia, Melena Providers:                Arta Silence, MD, Clyde Lundborg, RN, Laverda Sorenson, Technician, Arnoldo Hooker, CRNA Referring MD:             [facility] Medicines:                Monitored Anesthesia Care Complications:            No immediate complications. Estimated Blood Loss:     Estimated blood loss: none. Procedure:                Pre-Anesthesia Assessment:                           - Prior to the procedure, a History and Physical                            was performed, and patient medications and                            allergies were reviewed. The patient's tolerance of                            previous anesthesia was also reviewed. The risks                            and benefits of the procedure and the sedation                            options and risks were discussed with the patient.                            All questions were answered, and informed consent                            was obtained. Prior Anticoagulants: The patient has                            taken no previous anticoagulant or antiplatelet                            agents. ASA Grade Assessment: III - A patient with                            severe systemic disease. After reviewing the risks  and benefits, the patient was deemed in                            satisfactory condition to undergo the procedure.                           After obtaining informed consent, the endoscope was                            passed under direct vision. Throughout the                            procedure, the patient's blood pressure, pulse, and                             oxygen saturations were monitored continuously. The                            GIF-H190 ([id number]) Olympus gastroscope was                            introduced through the mouth, and advanced to the                            second part of duodenum. The upper GI endoscopy was                            accomplished without difficulty. The patient                            tolerated the procedure well. Scope In: Scope Out: Findings:      Patchy, white plaques were found in the entire esophagus.      The exam of the esophagus was otherwise normal.      Patchy mild inflammation was found in the gastric body and in the       gastric antrum.      Some antropyloric edema noted, but no clear underlying ulcer was       identified. The exam of the stomach was otherwise normal.      The duodenal bulb, first portion of the duodenum and second portion of       the duodenum were normal.      No old or fresh blood was seen to the extent of our examination. Impression:               - Esophageal plaques were found, consistent with                            candidiasis.                           - Gastritis.                           - Normal duodenal bulb, first portion of the  duodenum and second portion of the duodenum.                           - No clear source of anemia was identified on                            endoscopy. Moderate Sedation:      None Recommendation:           - Return patient to hospital ward for ongoing care.                           - Clear liquid diet today.                           - Continue present medications.                           - Fluconazole for candida esophagitis.                           - Perform a colonoscopy tomorrow. If this is                            unrevealing, patient may need capsule endoscopy.                           [facility] will follow. Procedure Code(s):        --- Professional  ---                           [phone], Esophagogastroduodenoscopy, flexible,                            transoral; diagnostic, including collection of                            specimen(s) by brushing or washing, when performed                            (separate procedure) Diagnosis Code(s):        --- Professional ---                           K22.9, Disease of esophagus, unspecified                           K29.70, Gastritis, unspecified, without bleeding                           D62, Acute posthemorrhagic anemia                           K92.1, Melena (includes Hematochezia) CPT copyright 2019 American [employer]. All rights reserved. The codes documented in this report are preliminary and upon coder review may  be revised to meet current compliance requirements. Arta Silence, MD 09/28/2020 10:53:25 AM This report has been signed  electronically. Number of Addenda: 0

## 2020-09-28 NOTE — Plan of Care (Signed)

## 2020-09-28 NOTE — Consult Note (Signed)
Referring Provider: Dr. Ileene Musa Primary Care Physician:  Fanny Bien, MD Primary Gastroenterologist:  Dr. Michail Sermon  Reason for Consultation:  Anemia, Melena  HPI: Chelsea [name] is a 70 y.o. female with history of bipolar disorder with paranoia, COPD and sleep apnea, type 2 diabetes, hypertension, hyperlipidemia, hypothyroidism, history of reflux with esophageal narrowing and dilatation 2018, consulted due to new onset anemia and history of melena. Patient is familiar to Dr. Michail Sermon, last seen 2018 for screening colonoscopy and GERD with esophageal dilatation for previous food impaction.   Patient has a 2-week history of weakness, shortness of breath and fatigue.  Was having chest pain with deep breath, this is resolved.  She also recounts 3 episodes of diarrhea with large volume melena 1.5 weeks ago.   Denies iron or Pepto use.   Patient denies anti-inflammatories, alcohol, change in medications.   She is on low-dose baby aspirin. Patient also has not on any proton pump inhibitor or stomach medications outpatient. Denies nausea, vomiting, weight loss.  She denies GERD, AB pain, dysphagia, odynophagia. Patient denies previous gastrointestinal bleed.   Patient denies family history of gastrointestinal malignancy. Patient is on breo for COPD.  Patient presented to the ER with hemoglobin of 6.1 and 5.6, previous hemoglobin 2018 was 12.4. Patient has received 1 unit of packed red blood cells, and currently getting another. No leukocytosis, platelets normal.  Iron 29, ferritin 4 Patient's BUN 20, creatinine 1.74 which is around baseline.  Hypokalemia at 3.0.  Endoscopy 06/10/2016 - Low-grade of narrowing and non-obstructing Schatzki ring. Dilated. - Normal stomach. - Normal examined duodenum. - No specimens collected.  Colonoscopy 01/30/2017 - One 8 mm polyp in the cecum, removed with a hot snare. Resected and retrieved. - Internal hemorrhoids.  Past Medical History:  Diagnosis  Date   Anxiety    Arthritis    Bipolar disorder (HCC)    COPD (chronic obstructive pulmonary disease) (HCC)    Diabetes mellitus type II    Esophageal stricture    moderate distal   GERD (gastroesophageal reflux disease)    Heart murmur    "HAD AN ECHO YEARS AGO- NOTHING TO BE CONCERNED ABOUT"   Hyperlipemia    Hypertension    Hypothyroidism    Mental disorder    Bipolar   Obesity    Oxygen desaturation during sleep    wears 2 liters at bedtime    Schizophrenia ([place])    patient reports that she is unaware of this   Shortness of breath    with a little activy   Sleep apnea    wears 2 liters of oxygen at bedtime instead of CPAP    Past Surgical History:  Procedure Laterality Date   BALLOON DILATION N/A 06/10/2016   Procedure: BALLOON DILATION;  Surgeon: Wilford Corner, MD;  Location: [place];  Service: Endoscopy;  Laterality: N/A;   BREAST EXCISIONAL BIOPSY Left 05/10/2012   benign   CHOLECYSTECTOMY     COLONOSCOPY WITH PROPOFOL N/A 01/30/2017   Procedure: COLONOSCOPY WITH PROPOFOL;  Surgeon: Wilford Corner, MD;  Location: [place];  Service: Endoscopy;  Laterality: N/A;   ESOPHAGOGASTRODUODENOSCOPY N/A 06/03/2016   Procedure: ESOPHAGOGASTRODUODENOSCOPY (EGD);  Surgeon: Wilford Corner, MD;  Location: [facility] END[facility]COPY;  Service: Endoscopy;  Laterality: N/A;   ESOPHAGOGASTRODUODENOSCOPY N/A 06/10/2016   Procedure: ESOPHAGOGASTRODUODENOSCOPY (EGD);  Surgeon: Wilford Corner, MD;  Location: [facility] ENDOSCOPY;  Service: Endoscopy;  Laterality: N/A;   EYE SURGERY     PARTIAL MASTECTOMY WITH NEEDLE LOCALIZATION Left 05/10/2012  Procedure: PARTIAL MASTECTOMY WITH NEEDLE LOCALIZATION;  Surgeon: Adin Hector, MD;  Location: [place];  Service: General;  Laterality: Left;   TONSILLECTOMY      Prior to Admission medications   Medication Sig Start Date End Date Taking? Authorizing Provider  aspirin 81 MG chewable tablet Chew 81 mg by mouth daily.   Yes [provider]   bumetanide (BUMEX) 1 MG tablet Take 1 tablet (1 mg total) by mouth daily. 12/04/12  Yes Delfina Redwood, MD  Cholecalciferol (VITAMIN D3) 5000 units TABS Take 1 tablet by mouth daily.   Yes [provider]  clonazePAM (KLONOPIN) 1 MG tablet Take 1 tablet (1 mg total) by mouth at bedtime. 09/10/20  Yes Arfeen, Arlyce Harman, MD  diclofenac sodium (VOLTAREN) 1 % GEL Apply 2 g topically 4 (four) times daily as needed.   Yes [provider]  FEROSUL 325 (65 Fe) MG tablet Take 325 mg by mouth daily. 06/21/18  Yes [provider]  fluticasone furoate-vilanterol (BREO ELLIPTA) 200-25 MCG/INH AEPB Inhale 1 puff into the lungs every morning.   Yes [provider]  INVOKANA 300 MG TABS tablet Take 300 mg by mouth daily before breakfast.  04/03/16  Yes [provider]  LEVEMIR 100 UNIT/ML injection Inject 30 Units into the skin daily. 07/21/18  Yes [provider]  levothyroxine (SYNTHROID) 125 MCG tablet Take 125 mcg by mouth daily before breakfast. 07/21/18  Yes [provider]  metFORMIN (GLUCOPHAGE) 500 MG tablet Take 500 mg by mouth daily. 01/23/20  Yes [provider]  montelukast (SINGULAIR) 10 MG tablet Take 10 mg by mouth daily. 01/23/20  Yes [provider]  nystatin ointment (MYCOSTATIN) Apply 1 application topically 2 (two) times daily.   Yes [provider]  omeprazole (PRILOSEC) 40 MG capsule Take 40 mg by mouth daily. 01/23/20  Yes [provider]  OZEMPIC, 1 MG/DOSE, 4 MG/3ML SOPN Inject 2 mg into the skin every evening. 01/23/20  Yes [provider]  PARoxetine (PAXIL) 40 MG tablet Take 1 tablet (40 mg total) by mouth every morning. 09/10/20  Yes Arfeen, Arlyce Harman, MD  rOPINIRole (REQUIP) 0.25 MG tablet TAKE 3 TABLETS BY MOUTH AT BEDTIME 08/06/20  Yes Millikan, Megan, NP  rosuvastatin (CRESTOR) 5 MG tablet Take 5 mg by mouth daily. 07/20/18  Yes [provider]  traZODone (DESYREL) 100 MG  tablet Take 1 tablet (100 mg total) by mouth at bedtime. 09/10/20  Yes Arfeen, Arlyce Harman, MD  TRUEPLUS INSULIN SYRINGE 29G X 1/2" 1 ML MISC  07/16/18  Yes [provider]  VENTOLIN HFA 108 (90 Base) MCG/ACT inhaler Inhale 1-2 puffs into the lungs every 4 (four) hours as needed for wheezing or shortness of breath. 07/21/18  Yes [provider]  ziprasidone (GEODON) 80 MG capsule Take 1 capsule (80 mg total) by mouth at bedtime. 09/10/20  Yes Arfeen, Arlyce Harman, MD    Scheduled Meds:  chlorhexidine  15 mL Mouth Rinse BID   cholecalciferol  5,000 Units Oral Daily   clonazePAM  1 mg Oral QHS   feeding supplement  1 Container Oral TID BM   ferrous sulfate  325 mg Oral Daily   fluticasone furoate-vilanterol  1 puff Inhalation BH-q7a   levothyroxine  125 mcg Oral Q0600   mouth rinse  15 mL Mouth Rinse q12n4p   montelukast  10 mg Oral Daily   pantoprazole (PROTONIX) IV  40 mg Intravenous QHS   PARoxetine  40 mg Oral  q morning   rOPINIRole  0.75 mg Oral QHS   rosuvastatin  5 mg Oral Daily   traZODone  100 mg Oral QHS   ziprasidone  80 mg Oral QHS   Continuous Infusions:  potassium chloride     PRN Meds:.albuterol, lip balm  Allergies as of 09/27/2020 - Review Complete 09/27/2020  Allergen Reaction Noted   Darvocet [propoxyphene n-acetaminophen] Anaphylaxis 07/11/2011   Fluoxetine Other (See Comments) 05/03/2012   Penicillins Rash 10/07/2007   Promethazine Hives 04/13/2018    Family History  Problem Relation Age of Onset   Alcohol abuse Father    Stroke Father    Alzheimer's disease Mother     Social History   Socioeconomic History   Marital status: Divorced    Spouse name: Not on file   Number of children: 0   Years of education: HS   Highest education level: Not on file  Occupational History   Occupation: Retired   Tobacco Use   Smoking status: Former    Packs/day: 3.00    Years: 29.00    Pack years: 87.00    Types: Cigarettes    Quit date: 05/03/1993    Years  since quitting: 27.4   Smokeless tobacco: Never  Vaping Use   Vaping Use: Never used  Substance and Sexual Activity   Alcohol use: No    Alcohol/week: 0.0 standard drinks   Drug use: No   Sexual activity: Not Currently  Other Topics Concern   Not on file  Social History Narrative   Drinks 2 cups of coffee a day   Social Determinants of Health   Financial Resource Strain: Not on file  Food Insecurity: Not on file  Transportation Needs: Not on file  Physical Activity: Not on file  Stress: Not on file  Social Connections: Not on file  Intimate Partner Violence: Not on file    Review of Systems:  Review of Systems  Constitutional:  Positive for malaise/fatigue. Negative for chills, fever and weight loss.  HENT:  Negative for hearing loss.   Respiratory:  Positive for shortness of breath. Negative for cough and wheezing.   Cardiovascular:  Positive for chest pain. Negative for leg swelling.  Gastrointestinal:  Positive for diarrhea and melena. Negative for abdominal pain, blood in stool, constipation, heartburn, nausea and vomiting.  Musculoskeletal:  Negative for falls.  Skin:  Negative for rash.  Neurological:  Positive for dizziness. Negative for loss of consciousness.  Psychiatric/Behavioral:  Negative for memory loss.     Physical Exam: Vital signs: Vitals:   09/28/20 0713 09/28/20 0745  BP: (!) 131/56   Pulse: 73   Resp: 18   Temp: 98.3 F (36.8 C)   SpO2: 97% 98%   Last BM Date: 09/26/20 Physical Exam Constitutional:      General: She is not in acute distress.    Appearance: Normal appearance. She is obese.  HENT:     Mouth/Throat:     Comments: Dry mouth, no white plaques Eyes:     Comments: Pale conjunctivae  Cardiovascular:     Rate and Rhythm: Normal rate and regular rhythm.     Heart sounds: Murmur heard.     Comments: Distant heart sounds Pulmonary:     Effort: Pulmonary effort is normal.     Breath sounds: Normal breath sounds.  Abdominal:      General: Bowel sounds are normal. There is no distension.     Palpations: Abdomen is soft. There is no mass.  Tenderness: There is no abdominal tenderness. There is no guarding.     Comments: obese  Musculoskeletal:        General: No swelling.  Skin:    General: Skin is warm and dry.     Coloration: Skin is not jaundiced.  Neurological:     General: No focal deficit present.     Mental Status: She is alert and oriented to person, place, and time.  Psychiatric:        Mood and Affect: Mood normal.        Behavior: Behavior normal.     GI:  Lab Results: Recent Labs    09/27/20 1528 09/28/20 0158  WBC 10.2 7.7  HGB 6.1* 5.6*  HCT 22.2* 19.7*  PLT 346 306   BMET Recent Labs    09/27/20 1528 09/28/20 0158  NA 139 138  K 3.0* 3.1*  CL 104 102  CO2 26 27  GLUCOSE 168* 172*  BUN 20 17  CREATININE 1.74* 1.44*  CALCIUM 8.7* 8.6*   LFT Recent Labs    09/27/20 1528  PROT 6.8  ALBUMIN 3.5  AST 19  ALT 12  ALKPHOS 86  BILITOT 0.4   PT/INR No results for input(s): LABPROT, INR in the last 72 hours.  Studies/Results: No results found.  Impression and Plan Symptomatic anemia, melena DDX esophagitis, gastritis, PUD, malignancy Patient with 3 days of melena large volume event resulting in symptomatic anemia, no more melena since that time.  Hemoglobin on admission 6.1, recheck 5.6 at 1, patient has received 1 unit packed red blood cells and received another currently.  We will get stat hemoglobin hematocrit. If hemoglobin is improved, patient does seem stable enough for endoscopy today I thoroughly discussed the procedure with the patient (at bedside) to include nature of the procedure, alternatives, benefits, and risks (including but not limited to bleeding, infection, perforation, anesthesia/cardiac pulmonary complications).  Patient verbalized understanding and gave verbal consent to proceed with EGD. Remain NPO Continue Protonix IV twice daily Continue to  monitor H&H with transfusion as needed to maintain hemoglobin greater than 7.    LOS: 0 days   Vladimir Crofts  PA-C 09/28/2020, 7:55 AM  Contact #  [phone]

## 2020-09-28 NOTE — Anesthesia Preprocedure Evaluation (Signed)
Anesthesia Evaluation  Patient identified by MRN, date of birth, ID band Patient awake    Reviewed: Allergy & Precautions, NPO status , Patient's Chart, lab work & pertinent test results  Airway Mallampati: II  TM Distance: >3 FB Neck ROM: Full    Dental  (+) Dental Advisory Given, Lower Dentures, Upper Dentures   Pulmonary sleep apnea , COPD,  COPD inhaler and oxygen dependent, former smoker,    Pulmonary exam normal breath sounds clear to auscultation       Cardiovascular hypertension, Pt. on medications Normal cardiovascular exam Rhythm:Regular Rate:Normal     Neuro/Psych PSYCHIATRIC DISORDERS Anxiety Bipolar Disorder Schizophrenia negative neurological ROS     GI/Hepatic Neg liver ROS, GERD  ,  Endo/Other  diabetes, Type 2, Oral Hypoglycemic AgentsHypothyroidism Morbid obesity  Renal/GU negative Renal ROS     Musculoskeletal  (+) Arthritis , Osteoarthritis,    Abdominal   Peds  Hematology  (+) Blood dyscrasia, anemia ,   Anesthesia Other Findings Day of surgery medications reviewed with the patient.  Reproductive/Obstetrics                             Anesthesia Physical  Anesthesia Plan  ASA: 3 and emergent  Anesthesia Plan: MAC   Post-op Pain Management:    Induction: Intravenous  PONV Risk Score and Plan: 2 and Ondansetron, Propofol infusion and Treatment may vary due to age or medical condition  Airway Management Planned: Nasal Cannula and Natural Airway  Additional Equipment: None  Intra-op Plan:   Post-operative Plan:   Informed Consent: I have reviewed the patients History and Physical, chart, labs and discussed the procedure including the risks, benefits and alternatives for the proposed anesthesia with the patient or authorized representative who has indicated his/her understanding and acceptance.     Dental advisory given  Plan Discussed with: CRNA and  Anesthesiologist  Anesthesia Plan Comments:         Anesthesia Quick Evaluation

## 2020-09-29 ENCOUNTER — Encounter (HOSPITAL_COMMUNITY): Payer: Self-pay | Admitting: Internal Medicine

## 2020-09-29 ENCOUNTER — Inpatient Hospital Stay (HOSPITAL_COMMUNITY): Payer: Medicare Other | Admitting: Certified Registered"

## 2020-09-29 ENCOUNTER — Encounter (HOSPITAL_COMMUNITY)
Admission: EM | Disposition: A | Discharge status: Home / Self Care | Payer: Self-pay | Source: Home / Self Care | Attending: Internal Medicine

## 2020-09-29 HISTORY — PX: POLYPECTOMY: SHX5525

## 2020-09-29 HISTORY — PX: COLONOSCOPY WITH PROPOFOL: SHX5780

## 2020-09-29 LAB — BASIC METABOLIC PANEL
Anion gap: 7 (ref 5–15)
BUN: 9 mg/dL (ref 8–23)
CO2: 26 mmol/L (ref 22–32)
Calcium: 8.7 mg/dL — ABNORMAL LOW (ref 8.9–10.3)
Chloride: 108 mmol/L (ref 98–111)
Creatinine, Ser: 1.17 mg/dL — ABNORMAL HIGH (ref 0.44–1.00)
GFR, Estimated: 51 mL/min — ABNORMAL LOW (ref >60)
Glucose, Bld: 149 mg/dL — ABNORMAL HIGH (ref 70–99)
Potassium: 3.3 mmol/L — ABNORMAL LOW (ref 3.5–5.1)
Sodium: 141 mmol/L (ref 135–145)

## 2020-09-29 LAB — TYPE AND SCREEN
ABO/RH(D): A POS
Antibody Screen: POSITIVE
PT AG Type: POSITIVE
Unit division: 0
Unit division: 0

## 2020-09-29 LAB — GLUCOSE, CAPILLARY: Glucose-Capillary: 144 mg/dL — ABNORMAL HIGH (ref 70–99)

## 2020-09-29 LAB — CBC
HCT: 27.7 % — ABNORMAL LOW (ref 36.0–46.0)
Hemoglobin: 8.5 g/dL — ABNORMAL LOW (ref 12.0–15.0)
MCH: 23.5 pg — ABNORMAL LOW (ref 26.0–34.0)
MCHC: 30.7 g/dL (ref 30.0–36.0)
MCV: 76.5 fL — ABNORMAL LOW (ref 80.0–100.0)
Platelets: 288 10*3/uL (ref 150–400)
RBC: 3.62 MIL/uL — ABNORMAL LOW (ref 3.87–5.11)
RDW: 17.3 % — ABNORMAL HIGH (ref 11.5–15.5)
WBC: 6.3 10*3/uL (ref 4.0–10.5)
nRBC: 0 % (ref 0.0–0.2)

## 2020-09-29 LAB — BPAM RBC
Blood Product Expiration Date: 202207292359
Blood Product Expiration Date: 202207292359
ISSUE DATE / TIME: 202207010256
ISSUE DATE / TIME: 202207010654
Unit Type and Rh: 6200
Unit Type and Rh: 6200

## 2020-09-29 LAB — MAGNESIUM: Magnesium: 1.7 mg/dL (ref 1.7–2.4)

## 2020-09-29 SURGERY — COLONOSCOPY WITH PROPOFOL
Anesthesia: Monitor Anesthesia Care

## 2020-09-29 MED ORDER — ADULT MULTIVITAMIN W/MINERALS CH
1.0000 | ORAL_TABLET | Freq: Every day | ORAL | Status: DC
  Filled 2020-09-29: qty 1

## 2020-09-29 MED ORDER — LIDOCAINE 2% (20 MG/ML) 5 ML SYRINGE
INTRAMUSCULAR | Status: DC | PRN
  Administered 2020-09-29: 40 mg via INTRAVENOUS

## 2020-09-29 MED ORDER — PROSOURCE PLUS PO LIQD: 30.0000 mL | Freq: Two times a day (BID) | ORAL | Status: DC

## 2020-09-29 MED ORDER — LACTATED RINGERS IV SOLN: INTRAVENOUS | Status: DC

## 2020-09-29 MED ORDER — PHENYLEPHRINE HCL (PRESSORS) 10 MG/ML IV SOLN
INTRAVENOUS | Status: AC
  Filled 2020-09-29: qty 1

## 2020-09-29 MED ORDER — FLUCONAZOLE 200 MG PO TABS: 200.0000 mg | ORAL_TABLET | Freq: Every day | ORAL | 0 refills | Status: AC

## 2020-09-29 MED ORDER — POTASSIUM CHLORIDE 10 MEQ/100ML IV SOLN
10.0000 meq | INTRAVENOUS | Status: AC
  Administered 2020-09-29: 10 meq via INTRAVENOUS
  Filled 2020-09-29: qty 100

## 2020-09-29 MED ORDER — PROPOFOL 10 MG/ML IV BOLUS
INTRAVENOUS | Status: DC | PRN
  Administered 2020-09-29 (×2): 50 mg via INTRAVENOUS
  Administered 2020-09-29 (×2): 20 mg via INTRAVENOUS
  Administered 2020-09-29 (×2): 50 mg via INTRAVENOUS
  Administered 2020-09-29: 20 mg via INTRAVENOUS
  Administered 2020-09-29: 50 mg via INTRAVENOUS
  Administered 2020-09-29: 10 mg via INTRAVENOUS
  Administered 2020-09-29: 60 mg via INTRAVENOUS
  Administered 2020-09-29: 20 mg via INTRAVENOUS

## 2020-09-29 MED ORDER — PROPOFOL 10 MG/ML IV BOLUS
INTRAVENOUS | Status: AC
  Filled 2020-09-29: qty 20

## 2020-09-29 MED ORDER — POTASSIUM CHLORIDE ER 10 MEQ PO TBCR: 10.0000 meq | EXTENDED_RELEASE_TABLET | Freq: Every day | ORAL | 0 refills | Status: DC

## 2020-09-29 SURGICAL SUPPLY — 21 items

## 2020-09-29 NOTE — Discharge Summary (Signed)
Physician Discharge Summary  Chelsea [name] SWN:[id number] DOB: 1950-09-26 DOA: 09/27/2020  PCP: Fanny Bien, MD  Admit date: 09/27/2020 Discharge date: 09/29/2020  Admitted From: Home Disposition:  Home  Recommendations for Outpatient Follow-up:  Follow up with PCP in 1 week Follow up with GI Dr. Paulita Fujita or Dr. Michail Sermon in 4 weeks Repeat CBC to check hemoglobin in 1 week  Discharge Condition: Stable CODE STATUS: Full code Diet recommendation: Carb modified diet  Brief/Interim Summary: Chelsea [name] is a 70 y.o. female with medical history significant for hypertension, hypothyroidism, history of anemia, paranoia and anxiety who presents with concerns of low hemoglobin. Reportedly for at least the past month she has been having increasing fatigue, weakness and feeling dizzy. Also has shortness of breath with exertion and chest pain when taking deep breaths.  Has felt nauseous but no vomiting.  Notes dark stool on 3 different occasions with last one being yesterday and also has diarrhea.  No abdominal pain.  She uses aspirin daily.  No alcohol use.  No prior history of GI bleed.  She had colonoscopy in 2018 with Dr. Michail Sermon with removal of 1 nonmalignant polyp with internal hemorrhoids noted.  Also had endoscopy and 05/2016 with finding of low-grade narrowing of the esophagus and subsequently underwent dilatation.  In the emergency department, patient had hemoglobin of 6.1.  GI was consulted and patient was transfused 2 units packed red blood cells.  Patient underwent EGD which revealed esophageal candidiasis, patient was started on Diflucan.  EKG was completed to check QTc which was 460.  Colonoscopy found some polyps but no signs or etiology of bleeding found.  Patient remained clinically stable and discharged home to follow-up with GI as an outpatient with outpatient capsule endoscopy consideration.  Discharge Diagnoses:  Principal Problem:   Acute blood loss anemia Active Problems:    Essential hypertension   Morbid obesity (HCC)   COPD (chronic obstructive pulmonary disease) (HCC)   Hypothyroidism   Paranoid (HCC)   Anxiety   HLD (hyperlipidemia)   GI bleed   Acute blood loss anemia, iron deficiency anemia, GI bleed, melena -Status post 2 unit packed red blood cell -Status post EGD 7/1: Esophageal plaques consistent with candidiasis, gastritis, no clear source of anemia -Iron deficiency with iron 29, ferritin 4, saturation ratio 6, TIBC 500.  Status post Feraheme -Status post colonoscopy 7/2: 2 small polyps resected, no sign of bleeding found -Follow-up with GI as outpatient   Candida esophagitis -Fluconazole x 14 days    Vitamin B12 deficiency -Repleted   Hypertension -Resume Bumex, added K   Hypothyroidism -Continue Synthroid  Hyperlipidemia -Continue Crestor  Paranoia/anxiety -Continue Klonopin, trazodone, Paxil, Geodon -EKG to check QTC: 460   CKD stage IIIb -Baseline creatinine 1.6 -Stable    Hypokalemia -Replace, trend      Discharge Instructions  Discharge Instructions     Call MD for:  difficulty breathing, headache or visual disturbances   Complete by: As directed    Call MD for:  extreme fatigue   Complete by: As directed    Call MD for:  persistant dizziness or light-headedness   Complete by: As directed    Call MD for:  persistant nausea and vomiting   Complete by: As directed    Call MD for:  severe uncontrolled pain   Complete by: As directed    Call MD for:  temperature >100.4   Complete by: As directed    Diet Carb Modified   Complete by: As directed  Discharge instructions   Complete by: As directed    You were cared for by a hospitalist during your hospital stay. If you have any questions about your discharge medications or the care you received while you were in the hospital after you are discharged, you can call the unit and ask to speak with the hospitalist on call if the hospitalist that took care of you is  not available. Once you are discharged, your primary care physician will handle any further medical issues. Please note that NO REFILLS for any discharge medications will be authorized once you are discharged, as it is imperative that you return to your primary care physician (or establish a relationship with a primary care physician if you do not have one) for your aftercare needs so that they can reassess your need for medications and monitor your lab values.   Increase activity slowly   Complete by: As directed       Allergies as of 09/29/2020       Reactions   Darvocet [propoxyphene N-acetaminophen] Anaphylaxis   Can take plain tylenol   Fluoxetine Other (See Comments)   Hallucinations    Penicillins Rash   REACTION: rash   Promethazine Hives        Medication List     STOP taking these medications    aspirin 81 MG chewable tablet       TAKE these medications    bumetanide 1 MG tablet Commonly known as: BUMEX Take 1 tablet (1 mg total) by mouth daily.   clonazePAM 1 MG tablet Commonly known as: KLONOPIN Take 1 tablet (1 mg total) by mouth at bedtime.   diclofenac sodium 1 % Gel Commonly known as: VOLTAREN Apply 2 g topically 4 (four) times daily as needed.   FeroSul 325 (65 FE) MG tablet Generic drug: ferrous sulfate Take 325 mg by mouth daily.   fluconazole 200 MG tablet Commonly known as: DIFLUCAN Take 1 tablet (200 mg total) by mouth daily for 13 days.   fluticasone furoate-vilanterol 200-25 MCG/INH Aepb Commonly known as: BREO ELLIPTA Inhale 1 puff into the lungs every morning.   Invokana 300 MG Tabs tablet Generic drug: canagliflozin Take 300 mg by mouth daily before breakfast.   Levemir 100 UNIT/ML injection Generic drug: insulin detemir Inject 30 Units into the skin daily.   levothyroxine 125 MCG tablet Commonly known as: SYNTHROID Take 125 mcg by mouth daily before breakfast.   metFORMIN 500 MG tablet Commonly known as: GLUCOPHAGE Take  500 mg by mouth daily.   montelukast 10 MG tablet Commonly known as: SINGULAIR Take 10 mg by mouth daily.   nystatin ointment Commonly known as: MYCOSTATIN Apply 1 application topically 2 (two) times daily.   omeprazole 40 MG capsule Commonly known as: PRILOSEC Take 40 mg by mouth daily.   Ozempic (1 MG/DOSE) 4 MG/3ML Sopn Generic drug: Semaglutide (1 MG/DOSE) Inject 2 mg into the skin every evening.   PARoxetine 40 MG tablet Commonly known as: PAXIL Take 1 tablet (40 mg total) by mouth every morning.   potassium chloride 10 MEQ tablet Commonly known as: Klor-Con 10 Take 1 tablet (10 mEq total) by mouth daily. Take with bumetanide   rOPINIRole 0.25 MG tablet Commonly known as: REQUIP TAKE 3 TABLETS BY MOUTH AT BEDTIME   rosuvastatin 5 MG tablet Commonly known as: CRESTOR Take 5 mg by mouth daily.   traZODone 100 MG tablet Commonly known as: DESYREL Take 1 tablet (100 mg total) by mouth at bedtime.  TRUEplus Insulin Syringe 29G X 1/2" 1 ML Misc Generic drug: INSULIN SYRINGE 1CC/29G   Ventolin HFA 108 (90 Base) MCG/ACT inhaler Generic drug: albuterol Inhale 1-2 puffs into the lungs every 4 (four) hours as needed for wheezing or shortness of breath.   Vitamin D3 125 MCG (5000 UT) Tabs Take 1 tablet by mouth daily.   ziprasidone 80 MG capsule Commonly known as: GEODON Take 1 capsule (80 mg total) by mouth at bedtime.        Follow-up Information     Fanny Bien, MD. Schedule an appointment as soon as possible for a visit in 1 week(s).   Specialty: Family Medicine Why: Repeat hemoglobin level in 1 week Contact information: [address] [place] [address] [phone]         Wilford Corner, MD. Schedule an appointment as soon as possible for a visit in 1 month(s).   Specialty: Gastroenterology Contact information: [address] [place] [address] [phone]                Allergies  Allergen  Reactions   Darvocet [Propoxyphene N-Acetaminophen] Anaphylaxis    Can take plain tylenol   Fluoxetine Other (See Comments)    Hallucinations    Penicillins Rash    REACTION: rash   Promethazine Hives    Consultations: GI   Procedures/Studies: No results found. EGD 7/1 Impression:               - Esophageal plaques were found, consistent with                           candidiasis.                           - Gastritis.                           - Normal duodenal bulb, first portion of the                           duodenum and second portion of the duodenum.                           - No clear source of anemia was identified on                           endoscopy. Recommendation:           - Return patient to hospital ward for ongoing care.                           - Clear liquid diet today.                           - Continue present medications.                           - Fluconazole for candida esophagitis.                           - Perform a colonoscopy tomorrow. If this is  unrevealing, patient may need capsule endoscopy.                           [facility] will follow.  Colonoscopy 7/2 Impression:               - Internal hemorrhoids.                           - Two small polyps in the mid transverse colon and                           in the distal ascending colon, removed with a cold                           snare. Resected and retrieved.                           - The examined portion of the ileum was normal.                           - The examination was otherwise normal. Recommendation:           - Soft diet today. No obvious signs of bleeding do                           not think she needs any further inpatient                           evaluation from our standpoint although if you feel                           strongly probable CT would be next and                           consideration of outpatient capsule if signs of  GI                           blood loss continue                           - Continue present medications.                           - Await pathology results.                           - Repeat colonoscopy in 5 years for surveillance                           based on pathology results.                           - Return to GI office in 4 weeks. Either Dr. Paulita Fujita                           or Dr.  Schooler are happy to see back in follow-up                           - Telephone GI clinic for pathology results in 1                           week.                           - Telephone GI clinic if symptomatic PRN.   Discharge Exam: Vitals:   09/29/20 0920 09/29/20 1007  BP: (!) 153/53 (!) 150/68  Pulse: 72 65  Resp: 17 15  Temp: 97.8 F (36.6 C) 97.9 F (36.6 C)  SpO2: 96% 94%    General: Pt is alert, awake, not in acute distress Cardiovascular: RRR, S1/S2 +, no edema Respiratory: CTA bilaterally, no wheezing, no rhonchi, no respiratory distress, no conversational dyspnea  Abdominal: Soft, NT, ND, bowel sounds + Extremities: no edema, no cyanosis Psych: Normal mood and affect   The results of significant diagnostics from this hospitalization (including imaging, microbiology, ancillary and laboratory) are listed below for reference.     Microbiology: Recent Results (from the past 240 hour(s))  Resp Panel by RT-PCR (Flu A&B, Covid) Nasopharyngeal Swab     Status: None   Collection Time: 09/27/20  6:58 PM   Specimen: Nasopharyngeal Swab; Nasopharyngeal(NP) swabs in vial transport medium  Result Value Ref Range Status   SARS Coronavirus 2 by RT PCR NEGATIVE NEGATIVE Final    Comment: (NOTE) SARS-CoV-2 target nucleic acids are NOT DETECTED.  The SARS-CoV-2 RNA is generally detectable in upper respiratory specimens during the acute phase of infection. The lowest concentration of SARS-CoV-2 viral copies this assay can detect is 138 copies/mL. A negative result does not preclude  SARS-Cov-2 infection and should not be used as the sole basis for treatment or other patient management decisions. A negative result may occur with  improper specimen collection/handling, submission of specimen other than nasopharyngeal swab, presence of viral mutation(s) within the areas targeted by this assay, and inadequate number of viral copies(<138 copies/mL). A negative result must be combined with clinical observations, patient history, and epidemiological information. The expected result is Negative.  Fact Sheet for Patients:  EntrepreneurPulse.com.au  Fact Sheet for Healthcare Providers:  IncredibleEmployment.be  This test is no t yet approved or cleared by the [place] FDA and  has been authorized for detection and/or diagnosis of SARS-CoV-2 by FDA under an Emergency Use Authorization (EUA). This EUA will remain  in effect (meaning this test can be used) for the duration of the COVID-19 declaration under Section 564(b)(1) of the Act, 21 U.S.C.section 360bbb-3(b)(1), unless the authorization is terminated  or revoked sooner.       Influenza A by PCR NEGATIVE NEGATIVE Final   Influenza B by PCR NEGATIVE NEGATIVE Final    Comment: (NOTE) The Xpert Xpress SARS-CoV-2/FLU/RSV plus assay is intended as an aid in the diagnosis of influenza from Nasopharyngeal swab specimens and should not be used as a sole basis for treatment. Nasal washings and aspirates are unacceptable for Xpert Xpress SARS-CoV-2/FLU/RSV testing.  Fact Sheet for Patients: EntrepreneurPulse.com.au  Fact Sheet for Healthcare Providers: IncredibleEmployment.be  This test is not yet approved or cleared by the [place] FDA and has been authorized for detection and/or diagnosis of SARS-CoV-2 by FDA under an Emergency Use Authorization (EUA). This  EUA will remain in effect (meaning this test can be used) for the duration of  the COVID-19 declaration under Section 564(b)(1) of the Act, 21 U.S.C. section 360bbb-3(b)(1), unless the authorization is terminated or revoked.  Performed at [facility], [place] [address], [place], [place] [address]      Labs: BNP (last 3 results) No results for input(s): BNP in the last 8760 hours. Basic Metabolic Panel: Recent Labs  Lab 09/27/20 1528 09/28/20 0158 09/29/20 0527  NA 139 138 141  K 3.0* 3.1* 3.3*  CL 104 102 108  CO2 26 27 26   GLUCOSE 168* 172* 149*  BUN 20 17 9   CREATININE 1.74* 1.44* 1.17*  CALCIUM 8.7* 8.6* 8.7*  MG  --   --  1.7   Liver Function Tests: Recent Labs  Lab 09/27/20 1528  AST 19  ALT 12  ALKPHOS 86  BILITOT 0.4  PROT 6.8  ALBUMIN 3.5   No results for input(s): LIPASE, AMYLASE in the last 168 hours. No results for input(s): AMMONIA in the last 168 hours. CBC: Recent Labs  Lab 09/27/20 1528 09/28/20 0158 09/28/20 0935 09/29/20 0527  WBC 10.2 7.7  --  6.3  NEUTROABS 7.4  --   --   --   HGB 6.1* 5.6* 8.7* 8.5*  HCT 22.2* 19.7* 28.8* 27.7*  MCV 72.8* 71.1*  --  76.5*  PLT 346 306  --  288   Cardiac Enzymes: No results for input(s): CKTOTAL, CKMB, CKMBINDEX, TROPONINI in the last 168 hours. BNP: Invalid input(s): POCBNP CBG: Recent Labs  Lab 09/27/20 2358 09/29/20 0857  GLUCAP 163* 144*   D-Dimer No results for input(s): DDIMER in the last 72 hours. Hgb A1c No results for input(s): HGBA1C in the last 72 hours. Lipid Profile No results for input(s): CHOL, HDL, LDLCALC, TRIG, CHOLHDL, LDLDIRECT in the last 72 hours. Thyroid function studies No results for input(s): TSH, T4TOTAL, T3FREE, THYROIDAB in the last 72 hours.  Invalid input(s): FREET3 Anemia work up Recent Labs    09/27/20 1528  VITAMINB12 136*  FOLATE 14.1  FERRITIN 4*  TIBC 500*  IRON 29  RETICCTPCT 2.3   Urinalysis    Component Value Date/Time   COLORURINE YELLOW 09/27/2020 1846   APPEARANCEUR CLEAR 09/27/2020 1846    LABSPEC 1.005 09/27/2020 1846   PHURINE 6.0 09/27/2020 1846   GLUCOSEU NEGATIVE 09/27/2020 1846   HGBUR NEGATIVE 09/27/2020 1846   BILIRUBINUR NEGATIVE 09/27/2020 1846   KETONESUR NEGATIVE 09/27/2020 1846   PROTEINUR NEGATIVE 09/27/2020 1846   UROBILINOGEN 1.0 12/03/2012 1531   NITRITE NEGATIVE 09/27/2020 1846   LEUKOCYTESUR LARGE (A) 09/27/2020 1846   Sepsis Labs Invalid input(s): PROCALCITONIN,  WBC,  LACTICIDVEN Microbiology Recent Results (from the past 240 hour(s))  Resp Panel by RT-PCR (Flu A&B, Covid) Nasopharyngeal Swab     Status: None   Collection Time: 09/27/20  6:58 PM   Specimen: Nasopharyngeal Swab; Nasopharyngeal(NP) swabs in vial transport medium  Result Value Ref Range Status   SARS Coronavirus 2 by RT PCR NEGATIVE NEGATIVE Final    Comment: (NOTE) SARS-CoV-2 target nucleic acids are NOT DETECTED.  The SARS-CoV-2 RNA is generally detectable in upper respiratory specimens during the acute phase of infection. The lowest concentration of SARS-CoV-2 viral copies this assay can detect is 138 copies/mL. A negative result does not preclude SARS-Cov-2 infection and should not be used as the sole basis for treatment or other patient management decisions. A negative result may occur with  improper specimen collection/handling,  submission of specimen other than nasopharyngeal swab, presence of viral mutation(s) within the areas targeted by this assay, and inadequate number of viral copies(<138 copies/mL). A negative result must be combined with clinical observations, patient history, and epidemiological information. The expected result is Negative.  Fact Sheet for Patients:  EntrepreneurPulse.com.au  Fact Sheet for Healthcare Providers:  IncredibleEmployment.be  This test is no t yet approved or cleared by the [place] FDA and  has been authorized for detection and/or diagnosis of SARS-CoV-2 by FDA under an Emergency Use  Authorization (EUA). This EUA will remain  in effect (meaning this test can be used) for the duration of the COVID-19 declaration under Section 564(b)(1) of the Act, 21 U.S.C.section 360bbb-3(b)(1), unless the authorization is terminated  or revoked sooner.       Influenza A by PCR NEGATIVE NEGATIVE Final   Influenza B by PCR NEGATIVE NEGATIVE Final    Comment: (NOTE) The Xpert Xpress SARS-CoV-2/FLU/RSV plus assay is intended as an aid in the diagnosis of influenza from Nasopharyngeal swab specimens and should not be used as a sole basis for treatment. Nasal washings and aspirates are unacceptable for Xpert Xpress SARS-CoV-2/FLU/RSV testing.  Fact Sheet for Patients: EntrepreneurPulse.com.au  Fact Sheet for Healthcare Providers: IncredibleEmployment.be  This test is not yet approved or cleared by the [place] FDA and has been authorized for detection and/or diagnosis of SARS-CoV-2 by FDA under an Emergency Use Authorization (EUA). This EUA will remain in effect (meaning this test can be used) for the duration of the COVID-19 declaration under Section 564(b)(1) of the Act, 21 U.S.C. section 360bbb-3(b)(1), unless the authorization is terminated or revoked.  Performed at [facility], [place] [address], [place], [place] [address]      Patient was seen and examined on the day of discharge and was found to be in stable condition. Time coordinating discharge: 25 minutes including assessment and coordination of care, as well as examination of the patient.   SIGNED:  Dessa Phi, DO [facility] 09/29/2020, 10:49 AM

## 2020-09-29 NOTE — Anesthesia Preprocedure Evaluation (Signed)
Anesthesia Evaluation    Reviewed: Allergy & Precautions, Patient's Chart, lab work & pertinent test results  Airway Mallampati: I  TM Distance: >3 FB Neck ROM: Full    Dental  (+) Upper Dentures, Lower Dentures   Pulmonary sleep apnea , COPD,  COPD inhaler, former smoker,    breath sounds clear to auscultation       Cardiovascular hypertension,  Rhythm:Regular Rate:Normal     Neuro/Psych PSYCHIATRIC DISORDERS Anxiety Bipolar Disorder    GI/Hepatic Neg liver ROS, GERD  Medicated,  Endo/Other  diabetes, Type 2, Oral Hypoglycemic Agents, Insulin DependentHypothyroidism   Renal/GU Renal InsufficiencyRenal disease     Musculoskeletal  (+) Arthritis ,   Abdominal Normal abdominal exam  (+)   Peds  Hematology   Anesthesia Other Findings   Reproductive/Obstetrics                             Anesthesia Physical Anesthesia Plan  ASA: 3  Anesthesia Plan: MAC   Post-op Pain Management:    Induction: Intravenous  PONV Risk Score and Plan: 0 and Propofol infusion  Airway Management Planned: Natural Airway  Additional Equipment: None  Intra-op Plan:   Post-operative Plan:   Informed Consent: I have reviewed the patients History and Physical, chart, labs and discussed the procedure including the risks, benefits and alternatives for the proposed anesthesia with the patient or authorized representative who has indicated his/her understanding and acceptance.       Plan Discussed with: CRNA  Anesthesia Plan Comments: (Lab Results      Component                Value               Date                      WBC                      6.3                 09/29/2020                HGB                      8.5 (L)             09/29/2020                HCT                      27.7 (L)            09/29/2020                MCV                      76.5 (L)            09/29/2020                PLT                       288                 09/29/2020           )        Anesthesia Quick Evaluation

## 2020-09-29 NOTE — Op Note (Signed)
[facility] Patient Name: Chelsea [name] Procedure Date: 09/29/2020 MRN: [id number] Attending MD: Clarene Essex , MD Date of Birth: 11-11-1950 CSN: [id number] Age: 70 Admit Type: Inpatient Procedure:                Colonoscopy Indications:              Last colonoscopy: 2018, Heme positive stool, Acute                            post hemorrhagic anemia, Personal history of                            colonic polyps Providers:                Clarene Essex, MD, Kary Kos RN, RN, Cletis Athens,                            Technician, Glenis Smoker, CRNA Referring MD:              Medicines:                Propofol total dose 400 mg IV, 40 mg IV lidocaine Complications:            No immediate complications. Estimated Blood Loss:     Estimated blood loss: none. Procedure:                Pre-Anesthesia Assessment:                           - Prior to the procedure, a History and Physical                            was performed, and patient medications and                            allergies were reviewed. The patient's tolerance of                            previous anesthesia was also reviewed. The risks                            and benefits of the procedure and the sedation                            options and risks were discussed with the patient.                            All questions were answered, and informed consent                            was obtained. Prior Anticoagulants: The patient has                            taken no previous anticoagulant or antiplatelet  agents except for aspirin. ASA Grade Assessment:                            III - A patient with severe systemic disease. After                            reviewing the risks and benefits, the patient was                            deemed in satisfactory condition to undergo the                            procedure.                           After obtaining informed consent,  the colonoscope                            was passed under direct vision. Throughout the                            procedure, the patient's blood pressure, pulse, and                            oxygen saturations were monitored continuously. The                            PCF-H190DL ([id number]) Olympus pediatric colonscope                            was introduced through the anus and advanced to the                            the terminal ileum. The terminal ileum, ileocecal                            valve, appendiceal orifice, and rectum were                            photographed. The colonoscopy was performed without                            difficulty. The patient tolerated the procedure                            well. The quality of the bowel preparation was                            adequate. abdominal pressure was applied Scope In: 8:15:03 AM Scope Out: 8:43:55 AM Scope Withdrawal Time: 0 hours 17 minutes 42 seconds  Total Procedure Duration: 0 hours 28 minutes 52 seconds  Findings:      Internal hemorrhoids were found during retroflexion, during perianal       exam and during digital exam. The hemorrhoids were small.  Two semi-sessile polyps were found in the mid transverse colon and       distal ascending colon. The polyps were small in size. These polyps were       removed with a cold snare. Resection and retrieval were complete.      The terminal ileum appeared normal.      The exam was otherwise without abnormality. Impression:               - Internal hemorrhoids.                           - Two small polyps in the mid transverse colon and                            in the distal ascending colon, removed with a cold                            snare. Resected and retrieved.                           - The examined portion of the ileum was normal.                           - The examination was otherwise normal. Moderate Sedation:      Not Applicable - Patient had  care per Anesthesia. Recommendation:           - Soft diet today. No obvious signs of bleeding do                            not think she needs any further inpatient                            evaluation from our standpoint although if you feel                            strongly probable CT would be next and                            consideration of outpatient capsule if signs of GI                            blood loss continue                           - Continue present medications.                           - Await pathology results.                           - Repeat colonoscopy in 5 years for surveillance                            based on pathology results.                           -  Return to GI office in 4 weeks. Either Dr. Paulita Fujita                            or Dr. Michail Sermon are happy to see back in follow-up                           - Telephone GI clinic for pathology results in 1                            week.                           - Telephone GI clinic if symptomatic PRN. Procedure Code(s):        --- Professional ---                           [id number], Colonoscopy, flexible; with removal of                            tumor(s), polyp(s), or other lesion(s) by snare                            technique Diagnosis Code(s):        --- Professional ---                           K63.5, Polyp of colon                           R19.5, Other fecal abnormalities                           D62, Acute posthemorrhagic anemia                           Z86.010, Personal history of colonic polyps CPT copyright 2019 American Medical Association. All rights reserved. The codes documented in this report are preliminary and upon coder review may  be revised to meet current compliance requirements. Clarene Essex, MD 09/29/2020 8:54:15 AM This report has been signed electronically. Number of Addenda: 0

## 2020-09-29 NOTE — Anesthesia Procedure Notes (Signed)
Procedure Name: MAC Date/Time: 09/29/2020 8:06 AM Performed by: Cynda Familia, CRNA Pre-anesthesia Checklist: Patient identified, Suction available, Patient being monitored, Emergency Drugs available and Timeout performed Patient Re-evaluated:Patient Re-evaluated prior to induction Oxygen Delivery Method: Simple face mask Placement Confirmation: positive ETCO2 and breath sounds checked- equal and bilateral Dental Injury: Teeth and Oropharynx as per pre-operative assessment

## 2020-09-29 NOTE — Progress Notes (Signed)
[name] 8:07 AM  Subjective: Patient seen and examined in hospital computer chart reviewed and she has had no signs of bleeding and supposedly did okay with the prep and has no new complaints and her case discussed with my partner Dr. Paulita Fujita  Objective: Vital signs stable afebrile exam please see preassessment evaluation hemoglobin stable BUN and creatinine okay  Assessment: Guaiac positive anemia nondiagnostic endoscopy  Plan: Okay to proceed with colonoscopy with anesthesia assistance  [facility]  office [phone] After 5PM or if no answer call [phone]

## 2020-09-29 NOTE — Progress Notes (Signed)
Initial Nutrition Assessment  DOCUMENTATION CODES:   Morbid obesity  INTERVENTION:   -Once diet is advanced:  -Resume Boost Breeze po TID, each supplement provides 250 kcal and 9 grams of protein  -Add 30 ml Prosource Plus BID, each supplement provides 100 kcals and 15 grams protein -Add MVI with minerals daily  NUTRITION DIAGNOSIS:   Inadequate oral intake related to altered GI function as evidenced by NPO status.  GOAL:   Patient will meet greater than or equal to 90% of their needs  MONITOR:   PO intake, Supplement acceptance, Diet advancement, Labs, Weight trends, Skin, I & O's  REASON FOR ASSESSMENT:   Malnutrition Screening Tool    ASSESSMENT:   Chelsea [name] is a 70 y.o. female with medical history significant for hypertension, hypothyroidism, history of anemia, paranoia and anxiety who presents with concerns of low hemoglobin.  Pt admitted with acute blood loss anemia and upper GIB.   7/1- s/p upper GI endoscopy- revealed candidiasis, gastritis, no clear source of anemia  Reviewed I/O's: +408 ml x 24 hours and +1.1 L since admission  UOP: 300 ml x 24 hours  Per GI notes, plan for colonoscopy today due to unremarkable endoscopy findings. Pt currently NPO for procedure.  Pt unavailable at time of visit. Pt down in endoscopy suite at time of visit. RD unable to obtain further nutrition-related history or complete nutrition-focused physical exam at this time.    Reviewed wt hx; wt has been stable over th epast year.   Medications reviewed and include vitamin D and lactated ringers infusion @ 10 ml/hr.   Labs reviewed: K: 3.3 (on IV supplementation).    Diet Order:   Diet Order             Diet NPO time specified Except for: Other (See Comments)  Diet effective midnight                   EDUCATION NEEDS:   No education needs have been identified at this time  Skin:  Skin Assessment: Reviewed RN Assessment  Last BM:  09/28/20  Height:   Ht  Readings from Last 1 Encounters:  09/27/20 5\' 2"  (1.575 m)    Weight:   Wt Readings from Last 1 Encounters:  09/27/20 102.1 kg    Ideal Body Weight:  50 kg  BMI:  Body mass index is 41.15 kg/m.  Estimated Nutritional Needs:   Kcal:  1800-2000  Protein:  100-115 grams  Fluid:  > 1.8 L    Loistine Chance, RD, LDN, [place] Registered Dietitian II Certified Diabetes Care and Education Specialist Please refer to [facility] for RD and/or RD on-call/weekend/after hours pager

## 2020-09-29 NOTE — Transfer of Care (Signed)
Immediate Anesthesia Transfer of Care Note  Patient: Chelsea [name]  Procedure(s) Performed: COLONOSCOPY WITH PROPOFOL POLYPECTOMY  Patient Location: PACU  Anesthesia Type:MAC  Level of Consciousness: awake and alert   Airway & Oxygen Therapy: Patient Spontanous Breathing and Patient connected to face mask oxygen  Post-op Assessment: Report given to RN and Post -op Vital signs reviewed and stable  Post vital signs: Reviewed and stable  Last Vitals:  Vitals Value Taken Time  BP 122/54 09/29/20 0854  Temp    Pulse    Resp 16 09/29/20 0855  SpO2    Vitals shown include unvalidated device data.  Last Pain:  Vitals:   09/29/20 0739  TempSrc: Oral  PainSc: 0-No pain         Complications: No notable events documented.

## 2020-09-29 NOTE — Anesthesia Postprocedure Evaluation (Signed)
Anesthesia Post Note  Patient: Chelsea [name]  Procedure(s) Performed: COLONOSCOPY WITH PROPOFOL POLYPECTOMY     Patient location during evaluation: PACU Anesthesia Type: MAC Level of consciousness: awake and alert Pain management: pain level controlled Vital Signs Assessment: post-procedure vital signs reviewed and stable Respiratory status: spontaneous breathing, nonlabored ventilation, respiratory function stable and patient connected to nasal cannula oxygen Cardiovascular status: stable and blood pressure returned to baseline Postop Assessment: no apparent nausea or vomiting Anesthetic complications: no   No notable events documented.  Last Vitals:  Vitals:   09/29/20 0910 09/29/20 0920  BP: (!) 145/93 (!) 153/53  Pulse: 72 72  Resp: 19 17  Temp:  36.6 C  SpO2: 98% 96%    Last Pain:  Vitals:   09/29/20 0920  TempSrc:   PainSc: 0-No pain                 Effie Berkshire

## 2020-09-30 LAB — URINE CULTURE: Culture: >=100000 — AB

## 2020-10-02 LAB — SURGICAL PATHOLOGY

## 2020-10-05 DIAGNOSIS — D500 Iron deficiency anemia secondary to blood loss (chronic): Secondary | ICD-10-CM | POA: Diagnosis not present

## 2020-10-08 DIAGNOSIS — D500 Iron deficiency anemia secondary to blood loss (chronic): Secondary | ICD-10-CM | POA: Diagnosis not present

## 2020-10-08 DIAGNOSIS — N390 Urinary tract infection, site not specified: Secondary | ICD-10-CM | POA: Diagnosis not present

## 2020-10-09 ENCOUNTER — Ambulatory Visit: Payer: Medicare Other | Admitting: Podiatry

## 2020-10-17 ENCOUNTER — Other Ambulatory Visit: Payer: Self-pay | Admitting: Nurse Practitioner

## 2020-10-17 DIAGNOSIS — Z1231 Encounter for screening mammogram for malignant neoplasm of breast: Secondary | ICD-10-CM

## 2020-10-25 DIAGNOSIS — D500 Iron deficiency anemia secondary to blood loss (chronic): Secondary | ICD-10-CM | POA: Diagnosis not present

## 2020-10-25 DIAGNOSIS — R5381 Other malaise: Secondary | ICD-10-CM | POA: Diagnosis not present

## 2020-10-31 ENCOUNTER — Other Ambulatory Visit (HOSPITAL_COMMUNITY): Payer: Self-pay | Admitting: Psychiatry

## 2020-10-31 DIAGNOSIS — F411 Generalized anxiety disorder: Secondary | ICD-10-CM

## 2020-10-31 DIAGNOSIS — F200 Paranoid schizophrenia: Secondary | ICD-10-CM

## 2020-11-13 ENCOUNTER — Ambulatory Visit: Payer: Medicare Other | Admitting: Adult Health

## 2020-11-13 DIAGNOSIS — D62 Acute posthemorrhagic anemia: Secondary | ICD-10-CM | POA: Diagnosis not present

## 2020-11-13 DIAGNOSIS — H52223 Regular astigmatism, bilateral: Secondary | ICD-10-CM | POA: Diagnosis not present

## 2020-11-13 DIAGNOSIS — H35033 Hypertensive retinopathy, bilateral: Secondary | ICD-10-CM | POA: Diagnosis not present

## 2020-11-13 DIAGNOSIS — E119 Type 2 diabetes mellitus without complications: Secondary | ICD-10-CM | POA: Diagnosis not present

## 2020-11-13 DIAGNOSIS — H5203 Hypermetropia, bilateral: Secondary | ICD-10-CM | POA: Diagnosis not present

## 2020-11-13 DIAGNOSIS — H2513 Age-related nuclear cataract, bilateral: Secondary | ICD-10-CM | POA: Diagnosis not present

## 2020-11-13 DIAGNOSIS — B372 Candidiasis of skin and nail: Secondary | ICD-10-CM | POA: Diagnosis not present

## 2020-11-13 DIAGNOSIS — H5000 Unspecified esotropia: Secondary | ICD-10-CM | POA: Diagnosis not present

## 2020-11-13 DIAGNOSIS — H524 Presbyopia: Secondary | ICD-10-CM | POA: Diagnosis not present

## 2020-11-13 DIAGNOSIS — H353131 Nonexudative age-related macular degeneration, bilateral, early dry stage: Secondary | ICD-10-CM | POA: Diagnosis not present

## 2020-11-20 DIAGNOSIS — R350 Frequency of micturition: Secondary | ICD-10-CM | POA: Diagnosis not present

## 2020-11-20 DIAGNOSIS — E11649 Type 2 diabetes mellitus with hypoglycemia without coma: Secondary | ICD-10-CM | POA: Diagnosis not present

## 2020-11-22 ENCOUNTER — Other Ambulatory Visit: Payer: Self-pay | Admitting: Gastroenterology

## 2020-11-22 DIAGNOSIS — F200 Paranoid schizophrenia: Secondary | ICD-10-CM | POA: Diagnosis not present

## 2020-11-22 DIAGNOSIS — Z9181 History of falling: Secondary | ICD-10-CM | POA: Diagnosis not present

## 2020-11-22 DIAGNOSIS — Z6834 Body mass index (BMI) 34.0-34.9, adult: Secondary | ICD-10-CM | POA: Diagnosis not present

## 2020-11-22 DIAGNOSIS — Z7984 Long term (current) use of oral hypoglycemic drugs: Secondary | ICD-10-CM | POA: Diagnosis not present

## 2020-11-22 DIAGNOSIS — B372 Candidiasis of skin and nail: Secondary | ICD-10-CM | POA: Diagnosis not present

## 2020-11-22 DIAGNOSIS — E1169 Type 2 diabetes mellitus with other specified complication: Secondary | ICD-10-CM | POA: Diagnosis not present

## 2020-11-22 DIAGNOSIS — E039 Hypothyroidism, unspecified: Secondary | ICD-10-CM | POA: Diagnosis not present

## 2020-11-22 DIAGNOSIS — I119 Hypertensive heart disease without heart failure: Secondary | ICD-10-CM | POA: Diagnosis not present

## 2020-11-22 DIAGNOSIS — D62 Acute posthemorrhagic anemia: Secondary | ICD-10-CM | POA: Diagnosis not present

## 2020-11-22 DIAGNOSIS — Z86010 Personal history of colonic polyps: Secondary | ICD-10-CM | POA: Diagnosis not present

## 2020-11-22 DIAGNOSIS — R1310 Dysphagia, unspecified: Secondary | ICD-10-CM | POA: Diagnosis not present

## 2020-11-22 DIAGNOSIS — G4733 Obstructive sleep apnea (adult) (pediatric): Secondary | ICD-10-CM | POA: Diagnosis not present

## 2020-11-22 DIAGNOSIS — E782 Mixed hyperlipidemia: Secondary | ICD-10-CM | POA: Diagnosis not present

## 2020-11-22 DIAGNOSIS — F411 Generalized anxiety disorder: Secondary | ICD-10-CM | POA: Diagnosis not present

## 2020-11-22 DIAGNOSIS — J449 Chronic obstructive pulmonary disease, unspecified: Secondary | ICD-10-CM | POA: Diagnosis not present

## 2020-11-22 DIAGNOSIS — G4700 Insomnia, unspecified: Secondary | ICD-10-CM | POA: Diagnosis not present

## 2020-11-26 ENCOUNTER — Other Ambulatory Visit (HOSPITAL_COMMUNITY): Payer: Self-pay | Admitting: Psychiatry

## 2020-11-26 DIAGNOSIS — F200 Paranoid schizophrenia: Secondary | ICD-10-CM

## 2020-11-27 DIAGNOSIS — E1169 Type 2 diabetes mellitus with other specified complication: Secondary | ICD-10-CM | POA: Diagnosis not present

## 2020-11-27 DIAGNOSIS — D62 Acute posthemorrhagic anemia: Secondary | ICD-10-CM | POA: Diagnosis not present

## 2020-11-27 DIAGNOSIS — J449 Chronic obstructive pulmonary disease, unspecified: Secondary | ICD-10-CM | POA: Diagnosis not present

## 2020-11-27 DIAGNOSIS — I119 Hypertensive heart disease without heart failure: Secondary | ICD-10-CM | POA: Diagnosis not present

## 2020-11-27 DIAGNOSIS — E039 Hypothyroidism, unspecified: Secondary | ICD-10-CM | POA: Diagnosis not present

## 2020-11-27 DIAGNOSIS — E782 Mixed hyperlipidemia: Secondary | ICD-10-CM | POA: Diagnosis not present

## 2020-11-29 DIAGNOSIS — E1169 Type 2 diabetes mellitus with other specified complication: Secondary | ICD-10-CM | POA: Diagnosis not present

## 2020-11-29 DIAGNOSIS — E039 Hypothyroidism, unspecified: Secondary | ICD-10-CM | POA: Diagnosis not present

## 2020-11-29 DIAGNOSIS — J449 Chronic obstructive pulmonary disease, unspecified: Secondary | ICD-10-CM | POA: Diagnosis not present

## 2020-11-29 DIAGNOSIS — D62 Acute posthemorrhagic anemia: Secondary | ICD-10-CM | POA: Diagnosis not present

## 2020-11-29 DIAGNOSIS — E782 Mixed hyperlipidemia: Secondary | ICD-10-CM | POA: Diagnosis not present

## 2020-11-29 DIAGNOSIS — I119 Hypertensive heart disease without heart failure: Secondary | ICD-10-CM | POA: Diagnosis not present

## 2020-11-30 ENCOUNTER — Ambulatory Visit: Payer: Medicare Other | Admitting: Podiatry

## 2020-12-04 ENCOUNTER — Encounter: Payer: Self-pay | Admitting: Adult Health

## 2020-12-04 ENCOUNTER — Ambulatory Visit (INDEPENDENT_AMBULATORY_CARE_PROVIDER_SITE_OTHER): Payer: Medicare Other | Admitting: Adult Health

## 2020-12-04 VITALS — BP 124/70 | HR 83 | Ht 62.0 in | Wt 191.2 lb

## 2020-12-04 DIAGNOSIS — J449 Chronic obstructive pulmonary disease, unspecified: Secondary | ICD-10-CM | POA: Diagnosis not present

## 2020-12-04 DIAGNOSIS — D62 Acute posthemorrhagic anemia: Secondary | ICD-10-CM | POA: Diagnosis not present

## 2020-12-04 DIAGNOSIS — I119 Hypertensive heart disease without heart failure: Secondary | ICD-10-CM | POA: Diagnosis not present

## 2020-12-04 DIAGNOSIS — R5383 Other fatigue: Secondary | ICD-10-CM | POA: Diagnosis not present

## 2020-12-04 DIAGNOSIS — R400 Somnolence: Secondary | ICD-10-CM | POA: Diagnosis not present

## 2020-12-04 DIAGNOSIS — E1169 Type 2 diabetes mellitus with other specified complication: Secondary | ICD-10-CM | POA: Diagnosis not present

## 2020-12-04 DIAGNOSIS — E782 Mixed hyperlipidemia: Secondary | ICD-10-CM | POA: Diagnosis not present

## 2020-12-04 DIAGNOSIS — G2581 Restless legs syndrome: Secondary | ICD-10-CM | POA: Diagnosis not present

## 2020-12-04 DIAGNOSIS — E039 Hypothyroidism, unspecified: Secondary | ICD-10-CM | POA: Diagnosis not present

## 2020-12-04 NOTE — Progress Notes (Signed)
PATIENT: Chelsea [name] DOB: Jul 10, 1950  REASON FOR VISIT: follow up HISTORY FROM: patient  HISTORY OF PRESENT ILLNESS: Today 12/04/20:  [name] is a 70 year old female with a history of restless leg syndrome.  She returns today for follow-up.  She states that Requip 0.75 mg at bedtime has been working well for her.  She is here today with her sister.  She reports that the patient is fatigued throughout the day.  She states that she sleeps during the day a lot.  Patient previously had a split-night study but sleep efficacy was impaired so therefore the study was limited.  However the patient BMI has decreased since that study.  Sleep apnea is not likely however the patient sister would like to repeat the test if possible.  11/10/19: [name] is a 70 year old female with a history of restless leg syndrome.  She returns today for follow-up.  Overall she has been doing well.  Reports that she continues to take Requip 0.75 milligrams at bedtime.  She states that this is working well for her.  She denies any trouble sleeping.  She returns today for an evaluation.  HISTORY 11/09/18:   [name] is a 70 year old female with a history of restless leg syndrome, nocturnal hypoxemia and daytime sleepiness.  She returns today for follow-up.  She states that her restless legs are controlled with Requip.  She continues to take Requip .75 mg at bedtime.  She reports that she still does not sleep very well at night.  She states that she does sleep a lot during the day.  She typically takes her nighttime meds around 1015.  This includes Requip, Klonopin, Geodon and trazodone.  She states that she typically stays up watching television then will try to go to bed until 1:30 AM.  She also reports that she drinks caffeine throughout the day.  She usually sleeps with the television on.  She continues to use supplemental O2 at bedtime.  She returns today for follow-up.   REVIEW OF SYSTEMS: Out of a complete 14  system review of symptoms, the patient complains only of the following symptoms, and all other reviewed systems are negative.  ESS 10 FSS 47  ALLERGIES: Allergies  Allergen Reactions   Darvocet [Propoxyphene N-Acetaminophen] Anaphylaxis    Can take plain tylenol   Fluoxetine Other (See Comments)    Hallucinations    Penicillins Rash    REACTION: rash   Promethazine Hives    HOME MEDICATIONS: Outpatient Medications Prior to Visit  Medication Sig Dispense Refill   bumetanide (BUMEX) 1 MG tablet Take 1 tablet (1 mg total) by mouth daily. 30 tablet 0   Cholecalciferol (VITAMIN D3) 5000 units TABS Take 1 tablet by mouth daily.     clonazePAM (KLONOPIN) 1 MG tablet Take 1 tablet (1 mg total) by mouth at bedtime. 30 tablet 2   diclofenac sodium (VOLTAREN) 1 % GEL Apply 2 g topically 4 (four) times daily as needed.     FEROSUL 325 (65 Fe) MG tablet Take 325 mg by mouth daily.     fluticasone furoate-vilanterol (BREO ELLIPTA) 200-25 MCG/INH AEPB Inhale 1 puff into the lungs every morning.     Insulin Aspart (NOVOLOG IJ) Inject as directed.     INVOKANA 300 MG TABS tablet Take 300 mg by mouth daily before breakfast.      LEVEMIR 100 UNIT/ML injection Inject 30 Units into the skin daily.     levothyroxine (SYNTHROID) 125 MCG tablet Take 125  mcg by mouth daily before breakfast.     metFORMIN (GLUCOPHAGE) 500 MG tablet Take 500 mg by mouth daily.     montelukast (SINGULAIR) 10 MG tablet Take 10 mg by mouth daily.     nystatin ointment (MYCOSTATIN) Apply 1 application topically 2 (two) times daily.     omeprazole (PRILOSEC) 40 MG capsule Take 40 mg by mouth daily.     OZEMPIC, 1 MG/DOSE, 4 MG/3ML SOPN Inject 2 mg into the skin every evening.     PARoxetine (PAXIL) 40 MG tablet Take 1 tablet (40 mg total) by mouth every morning. 90 tablet 0   rOPINIRole (REQUIP) 0.25 MG tablet TAKE 3 TABLETS BY MOUTH AT BEDTIME 90 tablet 5   rosuvastatin (CRESTOR) 5 MG tablet Take 5 mg by mouth daily.      traZODone (DESYREL) 100 MG tablet Take 1 tablet (100 mg total) by mouth at bedtime. 90 tablet 0   TRUEPLUS INSULIN SYRINGE 29G X 1/2" 1 ML MISC      VENTOLIN HFA 108 (90 Base) MCG/ACT inhaler Inhale 1-2 puffs into the lungs every 4 (four) hours as needed for wheezing or shortness of breath.     ziprasidone (GEODON) 80 MG capsule Take 1 capsule (80 mg total) by mouth at bedtime. 90 capsule 0   potassium chloride (KLOR-CON 10) 10 MEQ tablet Take 1 tablet (10 mEq total) by mouth daily. Take with bumetanide 30 tablet 0   No facility-administered medications prior to visit.    PAST MEDICAL HISTORY: Past Medical History:  Diagnosis Date   Anxiety    Arthritis    Bipolar disorder (HCC)    COPD (chronic obstructive pulmonary disease) (HCC)    Diabetes mellitus type II    Esophageal stricture    moderate distal   GERD (gastroesophageal reflux disease)    Heart murmur    "HAD AN ECHO YEARS AGO- NOTHING TO BE CONCERNED ABOUT"   Hyperlipemia    Hypertension    Hypothyroidism    Mental disorder    Bipolar   Obesity    Oxygen desaturation during sleep    wears 2 liters at bedtime    Schizophrenia ([place])    patient reports that she is unaware of this   Shortness of breath    with a little activy   Sleep apnea    wears 2 liters of oxygen at bedtime instead of CPAP    PAST SURGICAL HISTORY: Past Surgical History:  Procedure Laterality Date   BALLOON DILATION N/A 06/10/2016   Procedure: BALLOON DILATION;  Surgeon: Wilford Corner, MD;  Location: [place];  Service: Endoscopy;  Laterality: N/A;   BREAST EXCISIONAL BIOPSY Left 05/10/2012   benign   CHOLECYSTECTOMY     COLONOSCOPY WITH PROPOFOL N/A 01/30/2017   Procedure: COLONOSCOPY WITH PROPOFOL;  Surgeon: Wilford Corner, MD;  Location: [place];  Service: Endoscopy;  Laterality: N/A;   COLONOSCOPY WITH PROPOFOL N/A 09/29/2020   Procedure: COLONOSCOPY WITH PROPOFOL;  Surgeon: Clarene Essex, MD;  Location: [facility];  Service:  Endoscopy;  Laterality: N/A;   ESOPHAGOGASTRODUODENOSCOPY N/A 06/03/2016   Procedure: ESOPHAGOGASTRODUODENOSCOPY (EGD);  Surgeon: Wilford Corner, MD;  Location: [facility];  Service: Endoscopy;  Laterality: N/A;   ESOPHAGOGASTRODUODENOSCOPY N/A 06/10/2016   Procedure: ESOPHAGOGASTRODUODENOSCOPY (EGD);  Surgeon: Wilford Corner, MD;  Location: [facility]COPY;  Service: Endoscopy;  Laterality: N/A;   ESOPHAGOGASTRODUODENOSCOPY (EGD) WITH PROPOFOL N/A 09/28/2020   Procedure: ESOPHAGOGASTRODUODENOSCOPY (EGD) WITH PROPOFOL;  Surgeon: Arta Silence, MD;  Location: [facility];  Service: Endoscopy;  Laterality: N/A;   EYE SURGERY     PARTIAL MASTECTOMY WITH NEEDLE LOCALIZATION Left 05/10/2012   Procedure: PARTIAL MASTECTOMY WITH NEEDLE LOCALIZATION;  Surgeon: Adin Hector, MD;  Location: [place];  Service: General;  Laterality: Left;   POLYPECTOMY  09/29/2020   Procedure: POLYPECTOMY;  Surgeon: Clarene Essex, MD;  Location: [facility];  Service: Endoscopy;;   TONSILLECTOMY      FAMILY HISTORY: Family History  Problem Relation Age of Onset   Alzheimer's disease Mother    Alcohol abuse Father    Stroke Father    Restless legs syndrome Neg Hx     SOCIAL HISTORY: Social History   Socioeconomic History   Marital status: Divorced    Spouse name: Not on file   Number of children: 0   Years of education: HS   Highest education level: Not on file  Occupational History   Occupation: Retired   Tobacco Use   Smoking status: Former    Packs/day: 3.00    Years: 29.00    Pack years: 87.00    Types: Cigarettes    Quit date: 05/03/1993    Years since quitting: 27.6   Smokeless tobacco: Never  Vaping Use   Vaping Use: Never used  Substance and Sexual Activity   Alcohol use: No    Alcohol/week: 0.0 [name] drinks   Drug use: No   Sexual activity: Not Currently  Other Topics Concern   Not on file  Social History Narrative   Drinks 2 cups of coffee a day   Social Determinants of Health    Financial Resource Strain: Not on file  Food Insecurity: Not on file  Transportation Needs: Not on file  Physical Activity: Not on file  Stress: Not on file  Social Connections: Not on file  Intimate Partner Violence: Not on file      PHYSICAL EXAM  Vitals:   12/04/20 0821  BP: 124/70  Pulse: 83  Weight: 191 lb 3.2 oz (86.7 kg)  Height: '5\' 2"'$  (1.575 m)   Body mass index is 34.97 kg/m.  Generalized: Well developed, in no acute distress   Neurological examination  Mentation: Alert oriented to time, place, history taking. Follows all commands speech and language fluent Cranial nerve II-XII: Pupils were equal round reactive to light. Extraocular movements were full, visual field were full on confrontational test.  Neck circumference 14-1/2 inches Motor: The motor testing reveals 5 over 5 strength of all 4 extremities. Good symmetric motor tone is noted throughout.  Sensory: Sensory testing is intact to soft touch on all 4 extremities. No evidence of extinction is noted.  Coordination: Cerebellar testing reveals good finger-nose-finger and heel-to-shin bilaterally.  Gait and station: Gait is normal.   Reflexes: Deep tendon reflexes are symmetric and normal bilaterally.   DIAGNOSTIC DATA (LABS, IMAGING, TESTING) - I reviewed patient records, labs, notes, testing and imaging myself where available.  Lab Results  Component Value Date   WBC 6.3 09/29/2020   HGB 8.5 (L) 09/29/2020   HCT 27.7 (L) 09/29/2020   MCV 76.5 (L) 09/29/2020   PLT 288 09/29/2020      Component Value Date/Time   NA 141 09/29/2020 0527   K 3.3 (L) 09/29/2020 0527   CL 108 09/29/2020 0527   CO2 26 09/29/2020 0527   GLUCOSE 149 (H) 09/29/2020 0527   BUN 9 09/29/2020 0527   CREATININE 1.17 (H) 09/29/2020 0527   CALCIUM 8.7 (L) 09/29/2020 0527   PROT 6.8 09/27/2020 1528   ALBUMIN  3.5 09/27/2020 1528   AST 19 09/27/2020 1528   ALT 12 09/27/2020 1528   ALKPHOS 86 09/27/2020 1528   BILITOT 0.4  09/27/2020 1528   GFRNONAA 51 (L) 09/29/2020 0527   GFRAA 38 (L) 06/02/2016 2343   Lab Results  Component Value Date   HGBA1C 6.8 (H) 12/03/2012   Lab Results  Component Value Date   VITAMINB12 136 (L) 09/27/2020   Lab Results  Component Value Date   TSH 1.509 12/03/2012      ASSESSMENT AND PLAN 70 y.o. year old female  has a past medical history of Anxiety, Arthritis, Bipolar disorder ([place]), COPD (chronic obstructive pulmonary disease) ([place]), Diabetes mellitus type II, Esophageal stricture, GERD (gastroesophageal reflux disease), Heart murmur, Hyperlipemia, Hypertension, Hypothyroidism, Mental disorder, Obesity, Oxygen desaturation during sleep, Schizophrenia ([place]), Shortness of breath, and Sleep apnea. here with:  1.  Restless leg syndrome  Continue Requip 0.75 mg at bedtime Advised if symptoms worsen or she develops new symptoms she should let us know  2.  Daytime sleepiness and fatigue  Home sleep test ordered patient previously had a split-night study but due to poor sleep efficacy the study was limited. Follow-up in 1 year or sooner if needed    Ward Givens, MSN, NP-C 12/04/2020, 8:48 AM [facility] [address] [place],  [id number] [phone]

## 2020-12-04 NOTE — Patient Instructions (Signed)
Your Plan:  Continue Requip at bedtime Home sleep test ordered  Thank you for coming to see [place] at [facility]. I hope we have been able to provide you high quality care today.  You may receive a patient satisfaction survey over the next few weeks. We would appreciate your feedback and comments so that we may continue to improve ourselves and the health of our patients.

## 2020-12-06 ENCOUNTER — Telehealth: Payer: Self-pay | Admitting: Neurology

## 2020-12-06 DIAGNOSIS — E039 Hypothyroidism, unspecified: Secondary | ICD-10-CM | POA: Diagnosis not present

## 2020-12-06 DIAGNOSIS — I119 Hypertensive heart disease without heart failure: Secondary | ICD-10-CM | POA: Diagnosis not present

## 2020-12-06 DIAGNOSIS — J449 Chronic obstructive pulmonary disease, unspecified: Secondary | ICD-10-CM | POA: Diagnosis not present

## 2020-12-06 DIAGNOSIS — D62 Acute posthemorrhagic anemia: Secondary | ICD-10-CM | POA: Diagnosis not present

## 2020-12-06 DIAGNOSIS — E782 Mixed hyperlipidemia: Secondary | ICD-10-CM | POA: Diagnosis not present

## 2020-12-06 DIAGNOSIS — E1169 Type 2 diabetes mellitus with other specified complication: Secondary | ICD-10-CM | POA: Diagnosis not present

## 2020-12-06 NOTE — Telephone Encounter (Signed)
LVM for pt to call me back to schedule sleep study  

## 2020-12-07 DIAGNOSIS — D62 Acute posthemorrhagic anemia: Secondary | ICD-10-CM | POA: Diagnosis not present

## 2020-12-07 DIAGNOSIS — J449 Chronic obstructive pulmonary disease, unspecified: Secondary | ICD-10-CM | POA: Diagnosis not present

## 2020-12-07 DIAGNOSIS — E1169 Type 2 diabetes mellitus with other specified complication: Secondary | ICD-10-CM | POA: Diagnosis not present

## 2020-12-07 DIAGNOSIS — I119 Hypertensive heart disease without heart failure: Secondary | ICD-10-CM | POA: Diagnosis not present

## 2020-12-07 DIAGNOSIS — E782 Mixed hyperlipidemia: Secondary | ICD-10-CM | POA: Diagnosis not present

## 2020-12-07 DIAGNOSIS — E039 Hypothyroidism, unspecified: Secondary | ICD-10-CM | POA: Diagnosis not present

## 2020-12-10 ENCOUNTER — Other Ambulatory Visit: Payer: Self-pay

## 2020-12-10 ENCOUNTER — Ambulatory Visit
Admission: RE | Admit: 2020-12-10 | Discharge: 2020-12-10 | Disposition: A | Discharge status: Home / Self Care | Payer: Medicare Other | Source: Ambulatory Visit | Attending: Nurse Practitioner | Admitting: Nurse Practitioner

## 2020-12-10 DIAGNOSIS — Z1231 Encounter for screening mammogram for malignant neoplasm of breast: Secondary | ICD-10-CM | POA: Diagnosis not present

## 2020-12-11 DIAGNOSIS — E782 Mixed hyperlipidemia: Secondary | ICD-10-CM | POA: Diagnosis not present

## 2020-12-11 DIAGNOSIS — J449 Chronic obstructive pulmonary disease, unspecified: Secondary | ICD-10-CM | POA: Diagnosis not present

## 2020-12-11 DIAGNOSIS — D62 Acute posthemorrhagic anemia: Secondary | ICD-10-CM | POA: Diagnosis not present

## 2020-12-11 DIAGNOSIS — E1169 Type 2 diabetes mellitus with other specified complication: Secondary | ICD-10-CM | POA: Diagnosis not present

## 2020-12-11 DIAGNOSIS — I119 Hypertensive heart disease without heart failure: Secondary | ICD-10-CM | POA: Diagnosis not present

## 2020-12-11 DIAGNOSIS — E039 Hypothyroidism, unspecified: Secondary | ICD-10-CM | POA: Diagnosis not present

## 2020-12-13 DIAGNOSIS — J449 Chronic obstructive pulmonary disease, unspecified: Secondary | ICD-10-CM | POA: Diagnosis not present

## 2020-12-13 DIAGNOSIS — E782 Mixed hyperlipidemia: Secondary | ICD-10-CM | POA: Diagnosis not present

## 2020-12-13 DIAGNOSIS — E1169 Type 2 diabetes mellitus with other specified complication: Secondary | ICD-10-CM | POA: Diagnosis not present

## 2020-12-13 DIAGNOSIS — I119 Hypertensive heart disease without heart failure: Secondary | ICD-10-CM | POA: Diagnosis not present

## 2020-12-13 DIAGNOSIS — E039 Hypothyroidism, unspecified: Secondary | ICD-10-CM | POA: Diagnosis not present

## 2020-12-13 DIAGNOSIS — D62 Acute posthemorrhagic anemia: Secondary | ICD-10-CM | POA: Diagnosis not present

## 2020-12-17 ENCOUNTER — Encounter (HOSPITAL_COMMUNITY): Payer: Self-pay | Admitting: Psychiatry

## 2020-12-17 ENCOUNTER — Telehealth (INDEPENDENT_AMBULATORY_CARE_PROVIDER_SITE_OTHER): Payer: Medicare Other | Admitting: Psychiatry

## 2020-12-17 ENCOUNTER — Other Ambulatory Visit: Payer: Self-pay

## 2020-12-17 DIAGNOSIS — F200 Paranoid schizophrenia: Secondary | ICD-10-CM

## 2020-12-17 DIAGNOSIS — Z0000 Encounter for general adult medical examination without abnormal findings: Secondary | ICD-10-CM | POA: Diagnosis not present

## 2020-12-17 DIAGNOSIS — F411 Generalized anxiety disorder: Secondary | ICD-10-CM | POA: Diagnosis not present

## 2020-12-17 MED ORDER — TRAZODONE HCL 100 MG PO TABS: 100.0000 mg | ORAL_TABLET | Freq: Every day | ORAL | 0 refills | Status: DC

## 2020-12-17 MED ORDER — PAROXETINE HCL 40 MG PO TABS: 40.0000 mg | ORAL_TABLET | Freq: Every morning | ORAL | 0 refills | Status: DC

## 2020-12-17 MED ORDER — CLONAZEPAM 1 MG PO TABS: 1.0000 mg | ORAL_TABLET | Freq: Every day | ORAL | 2 refills | Status: DC

## 2020-12-17 MED ORDER — ZIPRASIDONE HCL 80 MG PO CAPS: 80.0000 mg | ORAL_CAPSULE | Freq: Every day | ORAL | 0 refills | Status: DC

## 2020-12-17 NOTE — Progress Notes (Signed)
Virtual Visit via Telephone Note  I connected with Chelsea [name] on 12/17/20 at 10:40 AM EDT by telephone and verified that I am speaking with the correct person using two identifiers.  Location: Patient: Home Provider: Home Office   I discussed the limitations, risks, security and privacy concerns of performing an evaluation and management service by telephone and the availability of in person appointments. I also discussed with the patient that there may be a patient responsible charge related to this service. The patient expressed understanding and agreed to proceed.   History of Present Illness: Patient is evaluated by phone session.  She was recently admitted because of anemia and she was feeling very tired, fatigued.  She was given a blood transfusion and she is feeling better.  She started walking and able to do things that she has not done when she was tired.  She feels her paranoia, hallucination, depression is under control.  She has no tremor or shakes or any EPS.  She denies any panic attack or any feeling of nervousness.  She is reluctant to cut down further medication.  We have discussed polypharmacy but she feels her medicine is keeping her stable and does not want to change or reduce the dose.  She lives with her sister who is very supportive.  Past Psychiatric History: H/O inpatient at [facility] and [facility] for mania and psychosis.  No h/o suicidal attempt.    Psychiatric Specialty Exam: Physical Exam  Review of Systems  Weight 191 lb (86.6 kg).There is no height or weight on file to calculate BMI.  General Appearance: NA  Eye Contact:  NA  Speech:  Slow  Volume:  Decreased  Mood:  Euthymic  Affect:  NA  Thought Process:  Goal Directed  Orientation:  Full (Time, Place, and Person)  Thought Content:  Logical  Suicidal Thoughts:  No  Homicidal Thoughts:  No  Memory:  Immediate;   Fair Recent;   Fair Remote;   Fair  Judgement:  Intact  Insight:  Present   Psychomotor Activity:  NA  Concentration:  Concentration: Fair and Attention Span: Fair  Recall:  [employer] of Knowledge:  Fair  Language:  Fair  Akathisia:  No  Handed:  Right  AIMS (if indicated):     Assets:  Communication Skills Desire for Improvement Intimacy Social Support  ADL's:  Intact  Cognition:  WNL  Sleep:   ok      Assessment and Plan: Schizophrenia chronic paranoid type.  Generalized anxiety disorder.  I reviewed blood work results.  Her hemoglobin is improved from the past after blood transfusion.  She like to keep the current medication and she has no side effects.  Continue Klonopin 1 mg at bedtime, trazodone 100 mg at bedtime, Paxil 40 mg daily and Geodon 80 mg at bedtime.  Recommended to call us back if she has any question or any concern.  Follow-up in 3 months.  Follow Up Instructions:    I discussed the assessment and treatment plan with the patient. The patient was provided an opportunity to ask questions and all were answered. The patient agreed with the plan and demonstrated an understanding of the instructions.   The patient was advised to call back or seek an in-person evaluation if the symptoms worsen or if the condition fails to improve as anticipated.  I provided 12 minutes of non-face-to-face time during this encounter.   Kathlee Nations, MD

## 2020-12-18 DIAGNOSIS — E039 Hypothyroidism, unspecified: Secondary | ICD-10-CM | POA: Diagnosis not present

## 2020-12-18 DIAGNOSIS — E782 Mixed hyperlipidemia: Secondary | ICD-10-CM | POA: Diagnosis not present

## 2020-12-18 DIAGNOSIS — D62 Acute posthemorrhagic anemia: Secondary | ICD-10-CM | POA: Diagnosis not present

## 2020-12-18 DIAGNOSIS — I119 Hypertensive heart disease without heart failure: Secondary | ICD-10-CM | POA: Diagnosis not present

## 2020-12-18 DIAGNOSIS — J449 Chronic obstructive pulmonary disease, unspecified: Secondary | ICD-10-CM | POA: Diagnosis not present

## 2020-12-18 DIAGNOSIS — E1169 Type 2 diabetes mellitus with other specified complication: Secondary | ICD-10-CM | POA: Diagnosis not present

## 2020-12-19 ENCOUNTER — Ambulatory Visit (HOSPITAL_COMMUNITY)
Admission: RE | Admit: 2020-12-19 | Discharge: 2020-12-19 | Disposition: A | Discharge status: Home / Self Care | Payer: Medicare Other | Attending: Gastroenterology | Admitting: Gastroenterology

## 2020-12-19 ENCOUNTER — Encounter (HOSPITAL_COMMUNITY)
Admission: RE | Disposition: A | Discharge status: Home / Self Care | Payer: Self-pay | Source: Home / Self Care | Attending: Gastroenterology

## 2020-12-19 DIAGNOSIS — R1310 Dysphagia, unspecified: Secondary | ICD-10-CM | POA: Insufficient documentation

## 2020-12-19 HISTORY — PX: ESOPHAGEAL MANOMETRY: SHX5429

## 2020-12-19 SURGERY — MANOMETRY, ESOPHAGUS

## 2020-12-19 MED ORDER — LIDOCAINE VISCOUS HCL 2 % MT SOLN
OROMUCOSAL | Status: AC
  Filled 2020-12-19: qty 15

## 2020-12-19 SURGICAL SUPPLY — 2 items
FACESHIELD LNG OPTICON STERILE (SAFETY) IMPLANT
GLOVE BIO SURGEON STRL SZ8 (GLOVE) ×4 IMPLANT

## 2020-12-19 NOTE — Progress Notes (Signed)
Esophageal manometry performed per protocol.  Patient tolerated well.  Report to be sent to Dr. Wilford Corner.

## 2020-12-20 ENCOUNTER — Encounter (HOSPITAL_COMMUNITY): Payer: Self-pay | Admitting: Gastroenterology

## 2020-12-20 DIAGNOSIS — J449 Chronic obstructive pulmonary disease, unspecified: Secondary | ICD-10-CM | POA: Diagnosis not present

## 2020-12-20 DIAGNOSIS — D62 Acute posthemorrhagic anemia: Secondary | ICD-10-CM | POA: Diagnosis not present

## 2020-12-20 DIAGNOSIS — I119 Hypertensive heart disease without heart failure: Secondary | ICD-10-CM | POA: Diagnosis not present

## 2020-12-20 DIAGNOSIS — R1310 Dysphagia, unspecified: Secondary | ICD-10-CM | POA: Diagnosis not present

## 2020-12-20 DIAGNOSIS — E782 Mixed hyperlipidemia: Secondary | ICD-10-CM | POA: Diagnosis not present

## 2020-12-20 DIAGNOSIS — E039 Hypothyroidism, unspecified: Secondary | ICD-10-CM | POA: Diagnosis not present

## 2020-12-20 DIAGNOSIS — E1169 Type 2 diabetes mellitus with other specified complication: Secondary | ICD-10-CM | POA: Diagnosis not present

## 2020-12-22 DIAGNOSIS — B372 Candidiasis of skin and nail: Secondary | ICD-10-CM | POA: Diagnosis not present

## 2020-12-22 DIAGNOSIS — Z9181 History of falling: Secondary | ICD-10-CM | POA: Diagnosis not present

## 2020-12-22 DIAGNOSIS — J449 Chronic obstructive pulmonary disease, unspecified: Secondary | ICD-10-CM | POA: Diagnosis not present

## 2020-12-22 DIAGNOSIS — F200 Paranoid schizophrenia: Secondary | ICD-10-CM | POA: Diagnosis not present

## 2020-12-22 DIAGNOSIS — E782 Mixed hyperlipidemia: Secondary | ICD-10-CM | POA: Diagnosis not present

## 2020-12-22 DIAGNOSIS — E039 Hypothyroidism, unspecified: Secondary | ICD-10-CM | POA: Diagnosis not present

## 2020-12-22 DIAGNOSIS — I119 Hypertensive heart disease without heart failure: Secondary | ICD-10-CM | POA: Diagnosis not present

## 2020-12-22 DIAGNOSIS — G4700 Insomnia, unspecified: Secondary | ICD-10-CM | POA: Diagnosis not present

## 2020-12-22 DIAGNOSIS — R1310 Dysphagia, unspecified: Secondary | ICD-10-CM | POA: Diagnosis not present

## 2020-12-22 DIAGNOSIS — D62 Acute posthemorrhagic anemia: Secondary | ICD-10-CM | POA: Diagnosis not present

## 2020-12-22 DIAGNOSIS — Z6834 Body mass index (BMI) 34.0-34.9, adult: Secondary | ICD-10-CM | POA: Diagnosis not present

## 2020-12-22 DIAGNOSIS — E1169 Type 2 diabetes mellitus with other specified complication: Secondary | ICD-10-CM | POA: Diagnosis not present

## 2020-12-22 DIAGNOSIS — G4733 Obstructive sleep apnea (adult) (pediatric): Secondary | ICD-10-CM | POA: Diagnosis not present

## 2020-12-22 DIAGNOSIS — F411 Generalized anxiety disorder: Secondary | ICD-10-CM | POA: Diagnosis not present

## 2020-12-22 DIAGNOSIS — Z7984 Long term (current) use of oral hypoglycemic drugs: Secondary | ICD-10-CM | POA: Diagnosis not present

## 2020-12-24 ENCOUNTER — Other Ambulatory Visit: Payer: Self-pay | Admitting: Adult Health

## 2020-12-25 DIAGNOSIS — I119 Hypertensive heart disease without heart failure: Secondary | ICD-10-CM | POA: Diagnosis not present

## 2020-12-25 DIAGNOSIS — E1169 Type 2 diabetes mellitus with other specified complication: Secondary | ICD-10-CM | POA: Diagnosis not present

## 2020-12-25 DIAGNOSIS — E119 Type 2 diabetes mellitus without complications: Secondary | ICD-10-CM | POA: Diagnosis not present

## 2020-12-25 DIAGNOSIS — Z110 Encounter for screening for intestinal infectious diseases: Secondary | ICD-10-CM | POA: Diagnosis not present

## 2020-12-25 DIAGNOSIS — E039 Hypothyroidism, unspecified: Secondary | ICD-10-CM | POA: Diagnosis not present

## 2020-12-25 DIAGNOSIS — E782 Mixed hyperlipidemia: Secondary | ICD-10-CM | POA: Diagnosis not present

## 2020-12-25 DIAGNOSIS — D62 Acute posthemorrhagic anemia: Secondary | ICD-10-CM | POA: Diagnosis not present

## 2020-12-25 DIAGNOSIS — J449 Chronic obstructive pulmonary disease, unspecified: Secondary | ICD-10-CM | POA: Diagnosis not present

## 2020-12-31 ENCOUNTER — Ambulatory Visit (INDEPENDENT_AMBULATORY_CARE_PROVIDER_SITE_OTHER): Payer: Medicare Other | Admitting: Neurology

## 2020-12-31 DIAGNOSIS — R5383 Other fatigue: Secondary | ICD-10-CM

## 2020-12-31 DIAGNOSIS — R400 Somnolence: Secondary | ICD-10-CM

## 2020-12-31 DIAGNOSIS — G4733 Obstructive sleep apnea (adult) (pediatric): Secondary | ICD-10-CM

## 2020-12-31 DIAGNOSIS — G2581 Restless legs syndrome: Secondary | ICD-10-CM

## 2021-01-01 DIAGNOSIS — I119 Hypertensive heart disease without heart failure: Secondary | ICD-10-CM | POA: Diagnosis not present

## 2021-01-01 DIAGNOSIS — E039 Hypothyroidism, unspecified: Secondary | ICD-10-CM | POA: Diagnosis not present

## 2021-01-01 DIAGNOSIS — E1169 Type 2 diabetes mellitus with other specified complication: Secondary | ICD-10-CM | POA: Diagnosis not present

## 2021-01-01 DIAGNOSIS — E782 Mixed hyperlipidemia: Secondary | ICD-10-CM | POA: Diagnosis not present

## 2021-01-01 DIAGNOSIS — D62 Acute posthemorrhagic anemia: Secondary | ICD-10-CM | POA: Diagnosis not present

## 2021-01-01 DIAGNOSIS — J449 Chronic obstructive pulmonary disease, unspecified: Secondary | ICD-10-CM | POA: Diagnosis not present

## 2021-01-07 NOTE — Progress Notes (Signed)
See procedure note.

## 2021-01-08 ENCOUNTER — Telehealth: Payer: Self-pay | Admitting: Neurology

## 2021-01-08 DIAGNOSIS — I119 Hypertensive heart disease without heart failure: Secondary | ICD-10-CM | POA: Diagnosis not present

## 2021-01-08 DIAGNOSIS — G4733 Obstructive sleep apnea (adult) (pediatric): Secondary | ICD-10-CM

## 2021-01-08 DIAGNOSIS — J449 Chronic obstructive pulmonary disease, unspecified: Secondary | ICD-10-CM | POA: Diagnosis not present

## 2021-01-08 DIAGNOSIS — D62 Acute posthemorrhagic anemia: Secondary | ICD-10-CM | POA: Diagnosis not present

## 2021-01-08 DIAGNOSIS — E782 Mixed hyperlipidemia: Secondary | ICD-10-CM | POA: Diagnosis not present

## 2021-01-08 DIAGNOSIS — E1169 Type 2 diabetes mellitus with other specified complication: Secondary | ICD-10-CM | POA: Diagnosis not present

## 2021-01-08 DIAGNOSIS — E039 Hypothyroidism, unspecified: Secondary | ICD-10-CM | POA: Diagnosis not present

## 2021-01-08 NOTE — Procedures (Signed)
     [facility]  HOME SLEEP TEST (Watch PAT) REPORT  STUDY DATE: 12/31/20  DOB: Nov 29, 1950  MRN: [id number]  ORDERING CLINICIAN: Star Age, MD, PhD   REFERRING CLINICIAN: Ward Givens, NP  CLINICAL INFORMATION/HISTORY: 70 year old right-handed woman with a complex medical history of hypothyroidism, hypertension, type 2 diabetes, hyperlipidemia, morbid obesity, mood disorder, Vitamin D deficiency, allergic rhinitis, osteoarthritis, who presents for re-evaluation of her prior diagnosis of obstructive sleep apnea.   BMI: 35.3 kg/m  FINDINGS:   Sleep Summary:   Total Recording Time (hours, min): 9 hours, 20 minutes  Total Sleep Time (hours, min):  8 hours, 50 minutes   Percent REM (%):    12.2%   Respiratory Indices:   Calculated pAHI (per hour):  43.1/hour         REM pAHI:    50.6/hour       NREM pAHI: 42.1/hour  Oxygen Saturation Statistics:    Oxygen Saturation (%) Mean: 92%   Minimum oxygen saturation (%):                 77%   O2 Saturation Range (%): 77-98%    O2 Saturation (minutes) <=88%: 6.6 min  Pulse Rate Statistics:   Pulse Mean (bpm):    64/min    Pulse Range (64-111/min)   IMPRESSION: OSA (obstructive sleep apnea), severe  RECOMMENDATION:  This home sleep test demonstrates severe obstructive sleep apnea with a total AHI of 43.1/hour and O2 nadir of 77%.  Snoring was detected, mostly in the moderate range, at times mild and at times louder.  Treatment with positive airway pressure is highly recommended. This will require - ideally - a full night CPAP titration study for proper treatment settings, O2 monitoring and mask fitting. For now, the patient will be advised to proceed with an autoPAP titration/trial at home.  A laboratory attended titration study can be considered in the future for optimization of his treatment and better tolerance of therapy.  Alternative treatment options are limited secondary to the severity of the  patient's sleep disordered breathing.  Please note, that untreated obstructive sleep apnea may carry additional perioperative morbidity. Patients with significant obstructive sleep apnea should receive perioperative PAP therapy and the surgeons and particularly the anesthesiologist should be informed of the diagnosis and the severity of the sleep disordered breathing. The patient should be cautioned not to drive, work at heights, or operate dangerous or heavy equipment when tired or sleepy. Review and reiteration of good sleep hygiene measures should be pursued with any patient. Other causes of the patient's symptoms, including circadian rhythm disturbances, an underlying mood disorder, medication effect and/or an underlying medical problem cannot be ruled out based on this test. Clinical correlation is recommended. The patient and her referring provider will be notified of the test results. The patient will be seen in follow up in sleep clinic at [facility].  I certify that I have reviewed the raw data recording prior to the issuance of this report in accordance with the standards of the American Academy of Sleep Medicine (AASM).   INTERPRETING PHYSICIAN:   Star Age, MD, PhD  Board Certified in Neurology and Sleep Medicine  [facility] [address], [place], [place] [address] [phone]

## 2021-01-08 NOTE — Telephone Encounter (Signed)
Patient was last seen by Ward Givens on 12/04/2020 for her restless leg syndrome.  She had daytime somnolence.  She had achieved some weight loss.  She was advised to proceed with a home sleep test for reevaluation of her sleep apnea.  She had a home sleep test on 12/31/2021.  Please advise patient or her caretaker that her home sleep test indicated severe obstructive sleep apnea.  I recommend that she start AutoPap therapy.  I will make an order in the chart.  She will need a follow-up appointment within 1 to 3 months after starting AutoPap therapy.  She can follow-up with [facility] or myself.

## 2021-01-09 NOTE — Telephone Encounter (Signed)
I called pt LMVM to return call for sleep test results.

## 2021-01-09 NOTE — Telephone Encounter (Signed)
Pt's sister returned call, please call back.

## 2021-01-10 ENCOUNTER — Encounter: Payer: Self-pay | Admitting: *Deleted

## 2021-01-10 DIAGNOSIS — R300 Dysuria: Secondary | ICD-10-CM | POA: Diagnosis not present

## 2021-01-10 NOTE — Telephone Encounter (Signed)
The pt's sister called [place] back. I discussed the pt's sleep study results with her as noted by Dr Rexene Alberts below.  She understands the patient's home sleep test showed severe obstructive sleep apnea and recommendation is to treat with AutoPap at home.  We discussed AutoPap versus CPAP and she understands the AutoPap is like a CPAP but has self adjusting pressures.  Patient has used [employer] before for oxygen but the sister has requested the patient use a new company for [employer].  Order will be sent to [facility].  She understands she will receive a call within 1 week.  They will conduct a benefits review.  [employer] [facility] will be responsible for setting patient up for the machine, showing her how to use it, fitting her with a mask, and show her how to care for the machine.  They will also troubleshoot any issues.  The sister's questions were answered.  She will call [employer] if she has not heard from them within 1 week.  She confirmed the address for a letter to be sent.  She prefers to give [place] a call to schedule the 30 to 90-day compliance appointment as soon as the patient receives the machine.  She also understands insurance requirements for patient to use the machine at least 4 hours every night and to follow-up in the clinic between 30 and 90 days and then yearly.   Order sent securely to [employer]. Sister wishes to be called to handle the AutoPAP for the pt. Letter mailed to pt/sister's home.

## 2021-01-15 DIAGNOSIS — I119 Hypertensive heart disease without heart failure: Secondary | ICD-10-CM | POA: Diagnosis not present

## 2021-01-15 DIAGNOSIS — E782 Mixed hyperlipidemia: Secondary | ICD-10-CM | POA: Diagnosis not present

## 2021-01-15 DIAGNOSIS — Z23 Encounter for immunization: Secondary | ICD-10-CM | POA: Diagnosis not present

## 2021-01-15 DIAGNOSIS — E1169 Type 2 diabetes mellitus with other specified complication: Secondary | ICD-10-CM | POA: Diagnosis not present

## 2021-01-15 DIAGNOSIS — J449 Chronic obstructive pulmonary disease, unspecified: Secondary | ICD-10-CM | POA: Diagnosis not present

## 2021-01-15 DIAGNOSIS — E039 Hypothyroidism, unspecified: Secondary | ICD-10-CM | POA: Diagnosis not present

## 2021-01-15 DIAGNOSIS — D62 Acute posthemorrhagic anemia: Secondary | ICD-10-CM | POA: Diagnosis not present

## 2021-01-22 DIAGNOSIS — E669 Obesity, unspecified: Secondary | ICD-10-CM | POA: Diagnosis not present

## 2021-01-22 DIAGNOSIS — E782 Mixed hyperlipidemia: Secondary | ICD-10-CM | POA: Diagnosis not present

## 2021-01-22 DIAGNOSIS — G4733 Obstructive sleep apnea (adult) (pediatric): Secondary | ICD-10-CM | POA: Diagnosis not present

## 2021-01-22 DIAGNOSIS — F411 Generalized anxiety disorder: Secondary | ICD-10-CM | POA: Diagnosis not present

## 2021-01-22 DIAGNOSIS — J309 Allergic rhinitis, unspecified: Secondary | ICD-10-CM | POA: Diagnosis not present

## 2021-01-22 DIAGNOSIS — G4700 Insomnia, unspecified: Secondary | ICD-10-CM | POA: Diagnosis not present

## 2021-01-22 DIAGNOSIS — E039 Hypothyroidism, unspecified: Secondary | ICD-10-CM | POA: Diagnosis not present

## 2021-01-22 DIAGNOSIS — F200 Paranoid schizophrenia: Secondary | ICD-10-CM | POA: Diagnosis not present

## 2021-01-22 DIAGNOSIS — J449 Chronic obstructive pulmonary disease, unspecified: Secondary | ICD-10-CM | POA: Diagnosis not present

## 2021-01-22 DIAGNOSIS — I10 Essential (primary) hypertension: Secondary | ICD-10-CM | POA: Diagnosis not present

## 2021-01-22 DIAGNOSIS — E119 Type 2 diabetes mellitus without complications: Secondary | ICD-10-CM | POA: Diagnosis not present

## 2021-01-22 DIAGNOSIS — D62 Acute posthemorrhagic anemia: Secondary | ICD-10-CM | POA: Diagnosis not present

## 2021-01-31 DIAGNOSIS — G4733 Obstructive sleep apnea (adult) (pediatric): Secondary | ICD-10-CM | POA: Diagnosis not present

## 2021-01-31 DIAGNOSIS — E1169 Type 2 diabetes mellitus with other specified complication: Secondary | ICD-10-CM | POA: Diagnosis not present

## 2021-01-31 DIAGNOSIS — E782 Mixed hyperlipidemia: Secondary | ICD-10-CM | POA: Diagnosis not present

## 2021-01-31 DIAGNOSIS — J449 Chronic obstructive pulmonary disease, unspecified: Secondary | ICD-10-CM | POA: Diagnosis not present

## 2021-01-31 DIAGNOSIS — Z794 Long term (current) use of insulin: Secondary | ICD-10-CM | POA: Diagnosis not present

## 2021-01-31 DIAGNOSIS — Z7984 Long term (current) use of oral hypoglycemic drugs: Secondary | ICD-10-CM | POA: Diagnosis not present

## 2021-02-12 DIAGNOSIS — E11649 Type 2 diabetes mellitus with hypoglycemia without coma: Secondary | ICD-10-CM | POA: Diagnosis not present

## 2021-02-12 DIAGNOSIS — I10 Essential (primary) hypertension: Secondary | ICD-10-CM | POA: Diagnosis not present

## 2021-02-12 DIAGNOSIS — E039 Hypothyroidism, unspecified: Secondary | ICD-10-CM | POA: Diagnosis not present

## 2021-02-12 DIAGNOSIS — E782 Mixed hyperlipidemia: Secondary | ICD-10-CM | POA: Diagnosis not present

## 2021-02-12 DIAGNOSIS — D62 Acute posthemorrhagic anemia: Secondary | ICD-10-CM | POA: Diagnosis not present

## 2021-02-12 DIAGNOSIS — J449 Chronic obstructive pulmonary disease, unspecified: Secondary | ICD-10-CM | POA: Diagnosis not present

## 2021-02-12 DIAGNOSIS — F061 Catatonic disorder due to known physiological condition: Secondary | ICD-10-CM | POA: Diagnosis not present

## 2021-02-18 ENCOUNTER — Other Ambulatory Visit (HOSPITAL_COMMUNITY): Payer: Self-pay | Admitting: Psychiatry

## 2021-02-18 DIAGNOSIS — F411 Generalized anxiety disorder: Secondary | ICD-10-CM

## 2021-02-18 DIAGNOSIS — F200 Paranoid schizophrenia: Secondary | ICD-10-CM

## 2021-02-20 DIAGNOSIS — J449 Chronic obstructive pulmonary disease, unspecified: Secondary | ICD-10-CM | POA: Diagnosis not present

## 2021-02-20 DIAGNOSIS — F200 Paranoid schizophrenia: Secondary | ICD-10-CM | POA: Diagnosis not present

## 2021-02-20 DIAGNOSIS — I10 Essential (primary) hypertension: Secondary | ICD-10-CM | POA: Diagnosis not present

## 2021-02-20 DIAGNOSIS — G4700 Insomnia, unspecified: Secondary | ICD-10-CM | POA: Diagnosis not present

## 2021-02-20 DIAGNOSIS — E119 Type 2 diabetes mellitus without complications: Secondary | ICD-10-CM | POA: Diagnosis not present

## 2021-02-20 DIAGNOSIS — E669 Obesity, unspecified: Secondary | ICD-10-CM | POA: Diagnosis not present

## 2021-02-20 DIAGNOSIS — Z794 Long term (current) use of insulin: Secondary | ICD-10-CM | POA: Diagnosis not present

## 2021-02-20 DIAGNOSIS — E039 Hypothyroidism, unspecified: Secondary | ICD-10-CM | POA: Diagnosis not present

## 2021-02-20 DIAGNOSIS — G4733 Obstructive sleep apnea (adult) (pediatric): Secondary | ICD-10-CM | POA: Diagnosis not present

## 2021-02-20 DIAGNOSIS — F411 Generalized anxiety disorder: Secondary | ICD-10-CM | POA: Diagnosis not present

## 2021-02-20 DIAGNOSIS — E782 Mixed hyperlipidemia: Secondary | ICD-10-CM | POA: Diagnosis not present

## 2021-03-04 ENCOUNTER — Other Ambulatory Visit: Payer: Self-pay

## 2021-03-04 ENCOUNTER — Telehealth (HOSPITAL_BASED_OUTPATIENT_CLINIC_OR_DEPARTMENT_OTHER): Payer: Medicare Other | Admitting: Psychiatry

## 2021-03-04 ENCOUNTER — Encounter (HOSPITAL_COMMUNITY): Payer: Self-pay | Admitting: Psychiatry

## 2021-03-04 DIAGNOSIS — F200 Paranoid schizophrenia: Secondary | ICD-10-CM | POA: Diagnosis not present

## 2021-03-04 DIAGNOSIS — F411 Generalized anxiety disorder: Secondary | ICD-10-CM | POA: Diagnosis not present

## 2021-03-04 MED ORDER — CLONAZEPAM 1 MG PO TABS: 1.0000 mg | ORAL_TABLET | Freq: Every day | ORAL | 2 refills | Status: DC

## 2021-03-04 MED ORDER — TRAZODONE HCL 100 MG PO TABS: 100.0000 mg | ORAL_TABLET | Freq: Every day | ORAL | 0 refills | Status: DC

## 2021-03-04 MED ORDER — ZIPRASIDONE HCL 80 MG PO CAPS: 80.0000 mg | ORAL_CAPSULE | Freq: Every day | ORAL | 0 refills | Status: DC

## 2021-03-04 MED ORDER — PAROXETINE HCL 40 MG PO TABS: 40.0000 mg | ORAL_TABLET | Freq: Every morning | ORAL | 0 refills | Status: DC

## 2021-03-04 NOTE — Progress Notes (Signed)
Virtual Visit via Telephone Note  I connected with Chelsea [name] on 03/04/21 at 10:40 AM EST by telephone and verified that I am speaking with the correct person using two identifiers.  Location: Patient: Home Provider: Home Office   I discussed the limitations, risks, security and privacy concerns of performing an evaluation and management service by telephone and the availability of in person appointments. I also discussed with the patient that there may be a patient responsible charge related to this service. The patient expressed understanding and agreed to proceed.   History of Present Illness: Patient is evaluated by phone session.  She is taking all her medication as prescribed.  She reported her anemia is much better after the infusion given few months ago.  She is more active and start exercising.  She lost 3 pounds since the last visit.  She is under the care of remote care physician who comes once a month for visit.  Patient is comfortable with the care.  She denies any paranoia, hallucination or any anxiety attack.  She had a good Thanksgiving and she excited about Christmas as she is going to see her niece and nephew and other family members.  She denies any tremors or shakes or any EPS.  She wants to keep the current medication.  Past Psychiatric History: H/O inpatient at [facility] and [facility] for mania and psychosis.  No h/o suicidal attempt.   Psychiatric Specialty Exam: Physical Exam  Review of Systems  Weight 189 lb (85.7 kg).There is no height or weight on file to calculate BMI.  General Appearance: NA  Eye Contact:  NA  Speech:  Slow  Volume:  Decreased  Mood:  Euthymic  Affect:  NA  Thought Process:  Goal Directed  Orientation:  Full (Time, Place, and Person)  Thought Content:  WDL  Suicidal Thoughts:  No  Homicidal Thoughts:  No  Memory:  Immediate;   Fair Recent;   Fair Remote;   Fair  Judgement:  Intact  Insight:  Present  Psychomotor Activity:   NA  Concentration:  Concentration: Fair and Attention Span: Fair  Recall:  [employer] of Knowledge:  Fair  Language:  Good  Akathisia:  No  Handed:  Right  AIMS (if indicated):     Assets:  Communication Skills Desire for Improvement Housing  ADL's:  Intact  Cognition:  WNL  Sleep:   fair, waiting for CPAP      Assessment and Plan: Schizophrenia chronic paranoid type.  Generalized anxiety disorder.  Patient is stable on her current medication.  She does not want to change the medication.  She is active and start walking every day and lost few pounds since the last visit.  Continue Klonopin 1 mg at bedtime, trazodone 100 mg at bedtime, Paxil 40 mg daily and Geodon 80 mg at bedtime.  Recommended to call us back if is any question or any concern.  Follow-up in 3 months.  Follow Up Instructions:    I discussed the assessment and treatment plan with the patient. The patient was provided an opportunity to ask questions and all were answered. The patient agreed with the plan and demonstrated an understanding of the instructions.   The patient was advised to call back or seek an in-person evaluation if the symptoms worsen or if the condition fails to improve as anticipated.  I provided 23 minutes of non-face-to-face time during this encounter.   Kathlee Nations, MD

## 2021-03-06 DIAGNOSIS — I10 Essential (primary) hypertension: Secondary | ICD-10-CM | POA: Diagnosis not present

## 2021-03-06 DIAGNOSIS — R059 Cough, unspecified: Secondary | ICD-10-CM | POA: Diagnosis not present

## 2021-03-06 DIAGNOSIS — Z03818 Encounter for observation for suspected exposure to other biological agents ruled out: Secondary | ICD-10-CM | POA: Diagnosis not present

## 2021-03-06 DIAGNOSIS — R0981 Nasal congestion: Secondary | ICD-10-CM | POA: Diagnosis not present

## 2021-03-07 DIAGNOSIS — R059 Cough, unspecified: Secondary | ICD-10-CM | POA: Diagnosis not present

## 2021-03-07 DIAGNOSIS — J069 Acute upper respiratory infection, unspecified: Secondary | ICD-10-CM | POA: Diagnosis not present

## 2021-03-07 DIAGNOSIS — Z20828 Contact with and (suspected) exposure to other viral communicable diseases: Secondary | ICD-10-CM | POA: Diagnosis not present

## 2021-03-08 DIAGNOSIS — J069 Acute upper respiratory infection, unspecified: Secondary | ICD-10-CM | POA: Diagnosis not present

## 2021-03-11 DIAGNOSIS — F32A Depression, unspecified: Secondary | ICD-10-CM | POA: Diagnosis not present

## 2021-03-11 DIAGNOSIS — E1169 Type 2 diabetes mellitus with other specified complication: Secondary | ICD-10-CM | POA: Diagnosis not present

## 2021-03-11 DIAGNOSIS — J449 Chronic obstructive pulmonary disease, unspecified: Secondary | ICD-10-CM | POA: Diagnosis not present

## 2021-03-11 DIAGNOSIS — J069 Acute upper respiratory infection, unspecified: Secondary | ICD-10-CM | POA: Diagnosis not present

## 2021-03-12 DIAGNOSIS — R051 Acute cough: Secondary | ICD-10-CM | POA: Diagnosis not present

## 2021-03-13 DIAGNOSIS — J069 Acute upper respiratory infection, unspecified: Secondary | ICD-10-CM | POA: Diagnosis not present

## 2021-03-14 ENCOUNTER — Encounter (HOSPITAL_COMMUNITY): Payer: Self-pay | Admitting: Emergency Medicine

## 2021-03-14 ENCOUNTER — Other Ambulatory Visit: Payer: Self-pay

## 2021-03-14 ENCOUNTER — Inpatient Hospital Stay (HOSPITAL_COMMUNITY)
Admission: EM | Admit: 2021-03-14 | Discharge: 2021-03-17 | DRG: 377 | Disposition: A | Discharge status: Home / Self Care | Payer: Medicare Other | Attending: Internal Medicine | Admitting: Internal Medicine

## 2021-03-14 ENCOUNTER — Emergency Department (HOSPITAL_COMMUNITY): Payer: Medicare Other

## 2021-03-14 DIAGNOSIS — E119 Type 2 diabetes mellitus without complications: Secondary | ICD-10-CM | POA: Diagnosis not present

## 2021-03-14 DIAGNOSIS — Z9012 Acquired absence of left breast and nipple: Secondary | ICD-10-CM | POA: Diagnosis not present

## 2021-03-14 DIAGNOSIS — E782 Mixed hyperlipidemia: Secondary | ICD-10-CM | POA: Diagnosis present

## 2021-03-14 DIAGNOSIS — Z888 Allergy status to other drugs, medicaments and biological substances status: Secondary | ICD-10-CM

## 2021-03-14 DIAGNOSIS — D500 Iron deficiency anemia secondary to blood loss (chronic): Secondary | ICD-10-CM | POA: Diagnosis not present

## 2021-03-14 DIAGNOSIS — F309 Manic episode, unspecified: Secondary | ICD-10-CM | POA: Diagnosis not present

## 2021-03-14 DIAGNOSIS — J9601 Acute respiratory failure with hypoxia: Secondary | ICD-10-CM | POA: Diagnosis present

## 2021-03-14 DIAGNOSIS — G4733 Obstructive sleep apnea (adult) (pediatric): Secondary | ICD-10-CM | POA: Diagnosis present

## 2021-03-14 DIAGNOSIS — K922 Gastrointestinal hemorrhage, unspecified: Secondary | ICD-10-CM | POA: Diagnosis present

## 2021-03-14 DIAGNOSIS — E039 Hypothyroidism, unspecified: Secondary | ICD-10-CM | POA: Diagnosis not present

## 2021-03-14 DIAGNOSIS — Z794 Long term (current) use of insulin: Secondary | ICD-10-CM

## 2021-03-14 DIAGNOSIS — Z6834 Body mass index (BMI) 34.0-34.9, adult: Secondary | ICD-10-CM

## 2021-03-14 DIAGNOSIS — Z9049 Acquired absence of other specified parts of digestive tract: Secondary | ICD-10-CM

## 2021-03-14 DIAGNOSIS — Z8719 Personal history of other diseases of the digestive system: Secondary | ICD-10-CM

## 2021-03-14 DIAGNOSIS — Z79890 Hormone replacement therapy (postmenopausal): Secondary | ICD-10-CM

## 2021-03-14 DIAGNOSIS — J309 Allergic rhinitis, unspecified: Secondary | ICD-10-CM | POA: Diagnosis present

## 2021-03-14 DIAGNOSIS — R195 Other fecal abnormalities: Secondary | ICD-10-CM | POA: Diagnosis present

## 2021-03-14 DIAGNOSIS — E669 Obesity, unspecified: Secondary | ICD-10-CM | POA: Diagnosis present

## 2021-03-14 DIAGNOSIS — Z79899 Other long term (current) drug therapy: Secondary | ICD-10-CM

## 2021-03-14 DIAGNOSIS — Z87891 Personal history of nicotine dependence: Secondary | ICD-10-CM | POA: Diagnosis not present

## 2021-03-14 DIAGNOSIS — Z880 Allergy status to penicillin: Secondary | ICD-10-CM

## 2021-03-14 DIAGNOSIS — E1169 Type 2 diabetes mellitus with other specified complication: Secondary | ICD-10-CM | POA: Diagnosis not present

## 2021-03-14 DIAGNOSIS — J449 Chronic obstructive pulmonary disease, unspecified: Secondary | ICD-10-CM | POA: Diagnosis present

## 2021-03-14 DIAGNOSIS — F319 Bipolar disorder, unspecified: Secondary | ICD-10-CM | POA: Diagnosis present

## 2021-03-14 DIAGNOSIS — K921 Melena: Secondary | ICD-10-CM | POA: Diagnosis not present

## 2021-03-14 DIAGNOSIS — N179 Acute kidney failure, unspecified: Secondary | ICD-10-CM

## 2021-03-14 DIAGNOSIS — J441 Chronic obstructive pulmonary disease with (acute) exacerbation: Secondary | ICD-10-CM | POA: Diagnosis not present

## 2021-03-14 DIAGNOSIS — J101 Influenza due to other identified influenza virus with other respiratory manifestations: Secondary | ICD-10-CM | POA: Diagnosis not present

## 2021-03-14 DIAGNOSIS — Z20822 Contact with and (suspected) exposure to covid-19: Secondary | ICD-10-CM | POA: Diagnosis present

## 2021-03-14 DIAGNOSIS — E785 Hyperlipidemia, unspecified: Secondary | ICD-10-CM | POA: Diagnosis present

## 2021-03-14 DIAGNOSIS — K219 Gastro-esophageal reflux disease without esophagitis: Secondary | ICD-10-CM | POA: Diagnosis present

## 2021-03-14 DIAGNOSIS — D649 Anemia, unspecified: Secondary | ICD-10-CM

## 2021-03-14 DIAGNOSIS — D62 Acute posthemorrhagic anemia: Secondary | ICD-10-CM | POA: Diagnosis present

## 2021-03-14 DIAGNOSIS — Z885 Allergy status to narcotic agent status: Secondary | ICD-10-CM

## 2021-03-14 DIAGNOSIS — Z7984 Long term (current) use of oral hypoglycemic drugs: Secondary | ICD-10-CM

## 2021-03-14 DIAGNOSIS — I10 Essential (primary) hypertension: Secondary | ICD-10-CM | POA: Diagnosis present

## 2021-03-14 DIAGNOSIS — I700 Atherosclerosis of aorta: Secondary | ICD-10-CM | POA: Diagnosis not present

## 2021-03-14 DIAGNOSIS — F419 Anxiety disorder, unspecified: Secondary | ICD-10-CM | POA: Diagnosis not present

## 2021-03-14 DIAGNOSIS — R0602 Shortness of breath: Secondary | ICD-10-CM | POA: Diagnosis not present

## 2021-03-14 LAB — CBC WITH DIFFERENTIAL/PLATELET
Abs Immature Granulocytes: 0.08 10*3/uL — ABNORMAL HIGH (ref 0.00–0.07)
Basophils Absolute: 0 10*3/uL (ref 0.0–0.1)
Basophils Relative: 0 %
Eosinophils Absolute: 0 10*3/uL (ref 0.0–0.5)
Eosinophils Relative: 0 %
HCT: 24.1 % — ABNORMAL LOW (ref 36.0–46.0)
Hemoglobin: 7.5 g/dL — ABNORMAL LOW (ref 12.0–15.0)
Immature Granulocytes: 1 %
Lymphocytes Relative: 4 %
Lymphs Abs: 0.4 10*3/uL — ABNORMAL LOW (ref 0.7–4.0)
MCH: 26.4 pg (ref 26.0–34.0)
MCHC: 31.1 g/dL (ref 30.0–36.0)
MCV: 84.9 fL (ref 80.0–100.0)
Monocytes Absolute: 0.2 10*3/uL (ref 0.1–1.0)
Monocytes Relative: 2 %
Neutro Abs: 9.1 10*3/uL — ABNORMAL HIGH (ref 1.7–7.7)
Neutrophils Relative %: 93 %
Platelets: 276 10*3/uL (ref 150–400)
RBC: 2.84 MIL/uL — ABNORMAL LOW (ref 3.87–5.11)
RDW: 13 % (ref 11.5–15.5)
WBC: 9.7 10*3/uL (ref 4.0–10.5)
nRBC: 0 % (ref 0.0–0.2)

## 2021-03-14 LAB — BRAIN NATRIURETIC PEPTIDE: B Natriuretic Peptide: 34.2 pg/mL (ref 0.0–100.0)

## 2021-03-14 LAB — LACTIC ACID, PLASMA
Lactic Acid, Venous: 4.2 mmol/L (ref 0.5–1.9)
Lactic Acid, Venous: 3.4 mmol/L (ref 0.5–1.9)

## 2021-03-14 LAB — BASIC METABOLIC PANEL
Anion gap: 13 (ref 5–15)
BUN: 28 mg/dL — ABNORMAL HIGH (ref 8–23)
CO2: 21 mmol/L — ABNORMAL LOW (ref 22–32)
Calcium: 8.6 mg/dL — ABNORMAL LOW (ref 8.9–10.3)
Chloride: 99 mmol/L (ref 98–111)
Creatinine, Ser: 1.88 mg/dL — ABNORMAL HIGH (ref 0.44–1.00)
GFR, Estimated: 29 mL/min — ABNORMAL LOW (ref >60)
Glucose, Bld: 363 mg/dL — ABNORMAL HIGH (ref 70–99)
Potassium: 3.6 mmol/L (ref 3.5–5.1)
Sodium: 133 mmol/L — ABNORMAL LOW (ref 135–145)

## 2021-03-14 LAB — RESP PANEL BY RT-PCR (FLU A&B, COVID) ARPGX2
Influenza A by PCR: POSITIVE — AB
Influenza B by PCR: NEGATIVE
SARS Coronavirus 2 by RT PCR: NEGATIVE

## 2021-03-14 LAB — PREPARE RBC (CROSSMATCH)

## 2021-03-14 LAB — TROPONIN I (HIGH SENSITIVITY)
Troponin I (High Sensitivity): 29 ng/L — ABNORMAL HIGH (ref <18)
Troponin I (High Sensitivity): 29 ng/L — ABNORMAL HIGH (ref <18)

## 2021-03-14 LAB — CBG MONITORING, ED: Glucose-Capillary: 199 mg/dL — ABNORMAL HIGH (ref 70–99)

## 2021-03-14 LAB — POC OCCULT BLOOD, ED: Fecal Occult Bld: POSITIVE — AB

## 2021-03-14 MED ORDER — PAROXETINE HCL 20 MG PO TABS
40.0000 mg | ORAL_TABLET | Freq: Every morning | ORAL | Status: DC
  Administered 2021-03-15 – 2021-03-17 (×3): 40 mg via ORAL
  Filled 2021-03-14 (×3): qty 2

## 2021-03-14 MED ORDER — TRAZODONE HCL 100 MG PO TABS
100.0000 mg | ORAL_TABLET | Freq: Every day | ORAL | Status: DC
  Administered 2021-03-15 – 2021-03-16 (×3): 100 mg via ORAL
  Filled 2021-03-14 (×3): qty 1

## 2021-03-14 MED ORDER — INSULIN ASPART 100 UNIT/ML IJ SOLN
0.0000 [IU] | Freq: Three times a day (TID) | INTRAMUSCULAR | Status: DC
  Administered 2021-03-15: 2 [IU] via SUBCUTANEOUS
  Administered 2021-03-16: 3 [IU] via SUBCUTANEOUS
  Administered 2021-03-16: 2 [IU] via SUBCUTANEOUS
  Administered 2021-03-16: 3 [IU] via SUBCUTANEOUS
  Administered 2021-03-17: 12:00:00 8 [IU] via SUBCUTANEOUS
  Filled 2021-03-14: qty 0.15

## 2021-03-14 MED ORDER — ZIPRASIDONE HCL 80 MG PO CAPS
80.0000 mg | ORAL_CAPSULE | Freq: Every day | ORAL | Status: DC
  Administered 2021-03-15 – 2021-03-16 (×3): 80 mg via ORAL
  Filled 2021-03-14 (×3): qty 1
  Filled 2021-03-14: qty 4

## 2021-03-14 MED ORDER — INSULIN ASPART 100 UNIT/ML IJ SOLN
0.0000 [IU] | Freq: Every day | INTRAMUSCULAR | Status: DC
  Administered 2021-03-15 – 2021-03-16 (×2): 2 [IU] via SUBCUTANEOUS
  Filled 2021-03-14: qty 0.05

## 2021-03-14 MED ORDER — SODIUM CHLORIDE 0.9 % IV SOLN: INTRAVENOUS | Status: AC

## 2021-03-14 MED ORDER — ALBUTEROL SULFATE (2.5 MG/3ML) 0.083% IN NEBU
2.5000 mg | INHALATION_SOLUTION | RESPIRATORY_TRACT | Status: DC | PRN
  Administered 2021-03-15: 2.5 mg via RESPIRATORY_TRACT
  Filled 2021-03-14: qty 3

## 2021-03-14 MED ORDER — ALBUTEROL SULFATE HFA 108 (90 BASE) MCG/ACT IN AERS: 1.0000 | INHALATION_SPRAY | RESPIRATORY_TRACT | Status: DC | PRN

## 2021-03-14 MED ORDER — LEVOTHYROXINE SODIUM 125 MCG PO TABS
125.0000 ug | ORAL_TABLET | Freq: Every day | ORAL | Status: DC
  Administered 2021-03-15 – 2021-03-17 (×3): 125 ug via ORAL
  Filled 2021-03-14 (×3): qty 1

## 2021-03-14 MED ORDER — MONTELUKAST SODIUM 10 MG PO TABS
10.0000 mg | ORAL_TABLET | Freq: Every day | ORAL | Status: DC
  Administered 2021-03-14 – 2021-03-17 (×4): 10 mg via ORAL
  Filled 2021-03-14 (×4): qty 1

## 2021-03-14 MED ORDER — OSELTAMIVIR PHOSPHATE 30 MG PO CAPS
30.0000 mg | ORAL_CAPSULE | Freq: Two times a day (BID) | ORAL | Status: DC
  Administered 2021-03-15 – 2021-03-17 (×5): 30 mg via ORAL
  Filled 2021-03-14 (×6): qty 1

## 2021-03-14 MED ORDER — OSELTAMIVIR PHOSPHATE 75 MG PO CAPS
75.0000 mg | ORAL_CAPSULE | Freq: Once | ORAL | Status: AC
  Administered 2021-03-14: 75 mg via ORAL
  Filled 2021-03-14: qty 1

## 2021-03-14 MED ORDER — CLONAZEPAM 1 MG PO TABS
1.0000 mg | ORAL_TABLET | Freq: Every day | ORAL | Status: DC
  Administered 2021-03-15 – 2021-03-16 (×3): 1 mg via ORAL
  Filled 2021-03-14: qty 2
  Filled 2021-03-14 (×2): qty 1

## 2021-03-14 MED ORDER — PANTOPRAZOLE SODIUM 40 MG PO TBEC
40.0000 mg | DELAYED_RELEASE_TABLET | Freq: Every day | ORAL | Status: DC
  Administered 2021-03-15 – 2021-03-17 (×3): 40 mg via ORAL
  Filled 2021-03-14 (×3): qty 1

## 2021-03-14 MED ORDER — FLUTICASONE FUROATE-VILANTEROL 200-25 MCG/ACT IN AEPB
1.0000 | INHALATION_SPRAY | Freq: Every day | RESPIRATORY_TRACT | Status: DC
  Administered 2021-03-16 – 2021-03-17 (×2): 1 via RESPIRATORY_TRACT
  Filled 2021-03-14: qty 28

## 2021-03-14 MED ORDER — INSULIN GLARGINE-YFGN 100 UNIT/ML ~~LOC~~ SOLN
20.0000 [IU] | Freq: Every day | SUBCUTANEOUS | Status: DC
  Administered 2021-03-14 – 2021-03-16 (×3): 20 [IU] via SUBCUTANEOUS
  Filled 2021-03-14 (×5): qty 0.2

## 2021-03-14 MED ORDER — ROSUVASTATIN CALCIUM 5 MG PO TABS
5.0000 mg | ORAL_TABLET | Freq: Every day | ORAL | Status: DC
  Administered 2021-03-15 – 2021-03-17 (×3): 5 mg via ORAL
  Filled 2021-03-14 (×3): qty 1

## 2021-03-14 MED ORDER — SODIUM CHLORIDE 0.9 % IV SOLN: 10.0000 mL/h | Freq: Once | INTRAVENOUS | Status: DC

## 2021-03-14 NOTE — ED Triage Notes (Signed)
Patient reports SOB x 2-3 days, her HMG level was low at 7.5 per sister. Denies blood in stool, denies recent falls or trauma.

## 2021-03-14 NOTE — H&P (Signed)
History and Physical   Chelsea [name] OQH:[id number] DOB: [birth year] DOA: 03/14/2021  PCP: [facility]   Patient coming from: Home  Chief Complaint: Shortness of breath  HPI: Chelsea [name] is a 70 y.o. female with medical history significant of anemia, anxiety, bipolar, hyperlipidemia, COPD, diabetes, hypertension, hypothyroidism, OSA, obesity presenting shortness of breath.  Patient reports 2 to 3 days of shortness of breath.  She states that her nursing out to check labs and she was noted to have a hemoglobin of 7.5.  She reports he did have a fever few days ago but none today.  She has had a cough recently as well.  She denies any dark or bloody stools.   Denies fevers, chills, chest pain, abdominal pain, constipation, diarrhea, nausea, vomiting   Was noted to be saturating 80% in the ED and improved on 2 L.  ED Course: Vital signs in the ED significant for requiring 2 L to maintain saturations.  Lab work-up showed BMP with sodium 130 corrects considering glucose of 363, bicarb 21, BUN 20, creatinine 1.88 from baseline around 1.5.  Calcium 8.6.  CBC with hemoglobin 7.5 down from previous of 8.5.  BMP normal.  Lactic acid elevated at 4.2 with repeat pending.  Troponin mildly elevated at 29. Acute respiratory positive for flu A.  Patient was typed and screened and transfusion was ordered.  Also blood cultures were ordered and are pending.  FOBT was positive.  Chest x-ray showed no acute Hilda Blades.  Patient received dose of Tamiflu addition to the above transfusion.  GI consulted and will see the patient in the morning.   Review of Systems: As per HPI otherwise all other systems reviewed and are negative.  Past Medical History:  Diagnosis Date   Anxiety    Arthritis    Bipolar disorder (HCC)    COPD (chronic obstructive pulmonary disease) (HCC)    Diabetes mellitus type II    Esophageal stricture    moderate distal   GERD (gastroesophageal reflux disease)    Heart  murmur    "HAD AN ECHO YEARS AGO- NOTHING TO BE CONCERNED ABOUT"   Hyperlipemia    Hypertension    Hypothyroidism    Mental disorder    Bipolar   Obesity    Oxygen desaturation during sleep    wears 2 liters at bedtime    Schizophrenia ([place])    patient reports that she is unaware of this   Shortness of breath    with a little activy   Sleep apnea    wears 2 liters of oxygen at bedtime instead of CPAP    Past Surgical History:  Procedure Laterality Date   BALLOON DILATION N/A 06/10/2016   Procedure: BALLOON DILATION;  Surgeon: Wilford Corner, MD;  Location: [place];  Service: Endoscopy;  Laterality: N/A;   BREAST EXCISIONAL BIOPSY Left 05/10/2012   benign   CHOLECYSTECTOMY     COLONOSCOPY WITH PROPOFOL N/A 01/30/2017   Procedure: COLONOSCOPY WITH PROPOFOL;  Surgeon: Wilford Corner, MD;  Location: [place];  Service: Endoscopy;  Laterality: N/A;   COLONOSCOPY WITH PROPOFOL N/A 09/29/2020   Procedure: COLONOSCOPY WITH PROPOFOL;  Surgeon: Clarene Essex, MD;  Location: [facility];  Service: Endoscopy;  Laterality: N/A;   ESOPHAGEAL MANOMETRY N/A 12/19/2020   Procedure: ESOPHAGEAL MANOMETRY (EM);  Surgeon: Wilford Corner, MD;  Location: [facility];  Service: Endoscopy;  Laterality: N/A;   ESOPHAGOGASTRODUODENOSCOPY N/A 06/03/2016   Procedure: ESOPHAGOGASTRODUODENOSCOPY (EGD);  Surgeon: Wilford Corner, MD;  Location:  [place] [facility];  Service: Endoscopy;  Laterality: N/A;   ESOPHAGOGASTRODUODENOSCOPY N/A 06/10/2016   Procedure: ESOPHAGOGASTRODUODENOSCOPY (EGD);  Surgeon: Wilford Corner, MD;  [facility];  Service: Endoscopy;  Laterality: N/A;   ESOPHAGOGASTRODUODENOSCOPY (EGD) WITH PROPOFOL N/A 09/28/2020   Procedure: ESOPHAGOGASTRODUODENOSCOPY (EGD) WITH PROPOFOL;  Surgeon: Arta Silence, MD;  Location: [facility];  Service: Endoscopy;  Laterality: N/A;   EYE SURGERY     PARTIAL MASTECTOMY WITH NEEDLE LOCALIZATION Left 05/10/2012   Procedure: PARTIAL  MASTECTOMY WITH NEEDLE LOCALIZATION;  Surgeon: Adin Hector, MD;  Location: [place];  Service: General;  Laterality: Left;   POLYPECTOMY  09/29/2020   Procedure: POLYPECTOMY;  Surgeon: Clarene Essex, MD;  Location: [facility];  Service: Endoscopy;;   TONSILLECTOMY      Social History  reports that she quit smoking about 27 years ago. Her smoking use included cigarettes. She has a 87.00 pack-year smoking history. She has never used smokeless tobacco. She reports that she does not drink alcohol and does not use drugs.  Allergies  Allergen Reactions   Darvocet [Propoxyphene N-Acetaminophen] Anaphylaxis    Can take plain tylenol   Fluoxetine Other (See Comments)    Hallucinations    Penicillins Rash    REACTION: rash   Promethazine Hives    Family History  Problem Relation Age of Onset   Alzheimer's disease Mother    Alcohol abuse Father    Stroke Father    Restless legs syndrome Neg Hx   Reviewed on admission  Prior to Admission medications   Medication Sig Start Date End Date Taking? Authorizing Provider  bumetanide (BUMEX) 1 MG tablet Take 1 tablet (1 mg total) by mouth daily. 12/04/12   Delfina Redwood, MD  Cholecalciferol (VITAMIN D3) 5000 units TABS Take 1 tablet by mouth daily.    [provider]  clonazePAM (KLONOPIN) 1 MG tablet Take 1 tablet (1 mg total) by mouth at bedtime. 03/04/21   Arfeen, Arlyce Harman, MD  diclofenac sodium (VOLTAREN) 1 % GEL Apply 2 g topically 4 (four) times daily as needed.    [provider]  FEROSUL 325 (65 Fe) MG tablet Take 325 mg by mouth daily. 06/21/18   [provider]  fluticasone furoate-vilanterol (BREO ELLIPTA) 200-25 MCG/INH AEPB Inhale 1 puff into the lungs every morning.    [provider]  Insulin Aspart (NOVOLOG IJ) Inject as directed.    [provider]  INVOKANA 300 MG TABS tablet Take 300 mg by mouth daily before breakfast.  04/03/16   [provider]  LEVEMIR 100 UNIT/ML injection  Inject 30 Units into the skin daily. 07/21/18   [provider]  levothyroxine (SYNTHROID) 125 MCG tablet Take 125 mcg by mouth daily before breakfast. 07/21/18   [provider]  metFORMIN (GLUCOPHAGE) 500 MG tablet Take 500 mg by mouth daily. 01/23/20   [provider]  montelukast (SINGULAIR) 10 MG tablet Take 10 mg by mouth daily. 01/23/20   [provider]  nystatin ointment (MYCOSTATIN) Apply 1 application topically 2 (two) times daily.    [provider]  omeprazole (PRILOSEC) 40 MG capsule Take 40 mg by mouth daily. 01/23/20   [provider]  OZEMPIC, 1 MG/DOSE, 4 MG/3ML SOPN Inject 2 mg into the skin every evening. 01/23/20   [provider]  PARoxetine (PAXIL) 40 MG tablet Take 1 tablet (40 mg total) by mouth every morning. 03/04/21   Arfeen, Arlyce Harman, MD  potassium chloride (KLOR-CON 10) 10 MEQ tablet Take  1 tablet (10 mEq total) by mouth daily. Take with bumetanide 09/29/20 10/29/20  Dessa Phi, DO  rOPINIRole (REQUIP) 0.25 MG tablet TAKE 3 TABLETS BY MOUTH AT BEDTIME 12/24/20   Ward Givens, NP  rosuvastatin (CRESTOR) 5 MG tablet Take 5 mg by mouth daily. 07/20/18   [provider]  traZODone (DESYREL) 100 MG tablet Take 1 tablet (100 mg total) by mouth at bedtime. 03/04/21   Arfeen, Arlyce Harman, MD  TRUEPLUS INSULIN SYRINGE 29G X 1/2" 1 ML MISC  07/16/18   [provider]  VENTOLIN HFA 108 (90 Base) MCG/ACT inhaler Inhale 1-2 puffs into the lungs every 4 (four) hours as needed for wheezing or shortness of breath. 07/21/18   [provider]  ziprasidone (GEODON) 80 MG capsule Take 1 capsule (80 mg total) by mouth at bedtime. 03/04/21   Kathlee Nations, MD    Physical Exam: Vitals:   03/14/21 1830 03/14/21 1845 03/14/21 1930 03/14/21 2000  BP: (!) 129/54 (!) 138/57 (!) 129/54 (!) 123/56  Pulse: 77 77 78 76  Resp: 16 11 10 20   Temp:      TempSrc:      SpO2: 94% 95% 96% 97%  Weight:      Height:        Physical Exam Constitutional:      General: She is not in acute distress.    Appearance: Normal appearance.  HENT:     Head: Normocephalic and atraumatic.     Mouth/Throat:     Mouth: Mucous membranes are moist.     Pharynx: Oropharynx is clear.  Eyes:     Extraocular Movements: Extraocular movements intact.     Pupils: Pupils are equal, round, and reactive to light.  Cardiovascular:     Rate and Rhythm: Normal rate and regular rhythm.     Pulses: Normal pulses.     Heart sounds: Normal heart sounds.  Pulmonary:     Effort: Pulmonary effort is normal. No respiratory distress.     Breath sounds: Normal breath sounds.  Abdominal:     General: Bowel sounds are normal. There is no distension.     Palpations: Abdomen is soft.  Musculoskeletal:        General: No swelling or deformity.  Skin:    General: Skin is warm and dry.     Coloration: Skin is pale.  Neurological:     General: No focal deficit present.     Mental Status: Mental status is at baseline.   Labs on Admission: I have personally reviewed following labs and imaging studies  CBC: Recent Labs  Lab 03/14/21 1700  WBC 9.7  NEUTROABS 9.1*  HGB 7.5*  HCT 24.1*  MCV 84.9  PLT 427    Basic Metabolic Panel: Recent Labs  Lab 03/14/21 1700  NA 133*  K 3.6  CL 99  CO2 21*  GLUCOSE 363*  BUN 28*  CREATININE 1.88*  CALCIUM 8.6*    GFR: Estimated Creatinine Clearance: 29.2 mL/min (A) (by C-G formula based on SCr of 1.88 mg/dL (H)).  Liver Function Tests: No results for input(s): AST, ALT, ALKPHOS, BILITOT, PROT, ALBUMIN in the last 168 hours.  Urine analysis:    Component Value Date/Time   COLORURINE YELLOW 09/27/2020 1846   APPEARANCEUR CLEAR 09/27/2020 1846   LABSPEC 1.005 09/27/2020 1846   PHURINE 6.0 09/27/2020 1846   GLUCOSEU NEGATIVE 09/27/2020 1846   HGBUR NEGATIVE 09/27/2020 1846   BILIRUBINUR NEGATIVE 09/27/2020 1846   KETONESUR NEGATIVE 09/27/2020 1846  PROTEINUR NEGATIVE 09/27/2020  1846   UROBILINOGEN 1.0 12/03/2012 1531   NITRITE NEGATIVE 09/27/2020 1846   LEUKOCYTESUR LARGE (A) 09/27/2020 1846    Radiological Exams on Admission: DG Chest Port 1 View  Result Date: 03/14/2021 CLINICAL DATA:  Shortness of breath EXAM: PORTABLE CHEST 1 VIEW COMPARISON:  None. FINDINGS: The heart size and mediastinal contours are within normal limits. Both lungs are clear. No pleural effusion. The visualized skeletal structures are unremarkable. IMPRESSION: No active disease. Electronically Signed   By: Macy Mis M.D.   On: 03/14/2021 16:40    EKG: Independently reviewed.  Sinus rhythm at 83 bpm.  Baseline wander in multiple leads.  From previous.  Assessment/Plan Principal Problem:   GI bleed Active Problems:   Essential hypertension   Type 2 diabetes mellitus without complication (HCC)   Diabetes mellitus without complication (HCC)   Bipolar I disorder, single manic episode ([place])   Combined hyperlipidemia associated with type 2 diabetes mellitus (HCC)   COPD (chronic obstructive pulmonary disease) (HCC)   Hypothyroidism   Acute blood loss anemia   Anxiety   HLD (hyperlipidemia)   AKI (acute kidney injury) ([place])   Influenza A  Symptomatic anemia GI bleed > Patient presenting with shortness of breath found to have hemoglobin down to 7.5 from previous of 8.5.  Noted to have heme positive stool.  1 unit of transfusion has been ordered. > GI consulted in ED and will see the patient. > Does have history of prior symptomatic anemia with endoscopy in July.  Colonoscopy at that time did not show any source of bleeding and EGD significant for gastritis. > Lactic acid elevated to 4.2 in ED we will continue to trend after receiving blood.  No abdominal pain to indicate any degree of ischemic colitis.  - Appreciate GI recommendation - Monitor on telemetry - Continue 1 unit transfusion and trend CBC - Trend lactic acid  Influenza A > Reported fever few days ago but none today.   Given symptoms of been the last few days started on Tamiflu in the ED. - Continue home Tamiflu  AKI > Creatinine elevated to 1.88 from baseline around 1.4. - Continue with IV fluids overnight - Trend renal function electrolytes - Avoid nephrotoxic agents  Hyperlipidemia - Continue home rosuvastatin  Hypothyroidism - Continue home Synthroid  COPD - Continue home Breo, as needed albuterol  Diabetes > On oral medications and 30 units of long-acting at home  - 20 units daily and SSI   Anxiety Bipolar - Continue home Paxil, trazodone, Geodon - Continue home Klonopin   DVT prophylaxis: SCDs Code Status:   Full  Family Communication:     None on admission.  Patient states family is up-to-date and knows that she is being admitted. Disposition Plan:   Patient is from:  Home  Anticipated DC to:  Home  Anticipated DC date:  1 to 3 days  Anticipated DC barriers: None  Consults called:  GI consult by EDP  Admission status:  Observation, telemetry   Severity of Illness: The appropriate patient status for this patient is OBSERVATION. Observation status is judged to be reasonable and necessary in order to provide the required intensity of service to ensure the patient's safety. The patient's presenting symptoms, physical exam findings, and initial radiographic and laboratory data in the context of their medical condition is felt to place them at decreased risk for further clinical deterioration. Furthermore, it is anticipated that the patient will be medically stable for discharge from the  hospital within 2 midnights of admission.    Marcelyn Bruins MD [facility]  How to contact the [facility] Attending or Consulting provider [place] or covering provider during after hours [place], for this patient?   Check the care team in [facility] and look for a) attending/consulting [facility] provider listed and b) the [facility] team listed Log into [email or web address] and use [place] universal password to  access. If you do not have the password, please contact the hospital operator. Locate the [facility] provider you are looking for under [facility] and page to a number that you can be directly reached. If you still have difficulty reaching the provider, please page the [facility] (Director on Call) for the Hospitalists listed on amion for assistance.  03/14/2021, 8:35 PM

## 2021-03-14 NOTE — ED Notes (Signed)
2LNC applied 

## 2021-03-14 NOTE — ED Provider Notes (Signed)
[facility] DEPT Provider Note   CSN: [id number] Arrival date & time: 03/14/21  1603     History Chief Complaint  Patient presents with   Abnormal Lab    Chelsea [name] is a 70 y.o. female.  Pt presents to the ED today with sob.  Pt has been sob for about 2-3 days.  She had a nurse come out to the house to draw some blood and hgb was 7.5.  Pt had fevers a few days ago, but none today.  She has had a cough.  She has not seen any black stool.  Pt has oxygen she can wear at home prn, but has not used it in 6 months.  Pt's O2 sat was in the mid-80s at home. Pt 88% on RA in triage.  She was placed on 2L and O2 sat is up to the mid-90s.      Past Medical History:  Diagnosis Date   Anxiety    Arthritis    Bipolar disorder ([place])    COPD (chronic obstructive pulmonary disease) (HCC)    Diabetes mellitus type II    Esophageal stricture    moderate distal   GERD (gastroesophageal reflux disease)    Heart murmur    "HAD AN ECHO YEARS AGO- NOTHING TO BE CONCERNED ABOUT"   Hyperlipemia    Hypertension    Hypothyroidism    Mental disorder    Bipolar   Obesity    Oxygen desaturation during sleep    wears 2 liters at bedtime    Schizophrenia ([place])    patient reports that she is unaware of this   Shortness of breath    with a little activy   Sleep apnea    wears 2 liters of oxygen at bedtime instead of CPAP    Patient Active Problem List   Diagnosis Date Noted   Paranoid ([place]) 09/28/2020   Anxiety 09/28/2020   HLD (hyperlipidemia) 09/28/2020   GI bleed 09/28/2020   Acute blood loss anemia 09/27/2020   Pain due to onychomycosis of toenails of both feet 11/05/2018   Diabetes mellitus without complication ([place]) 23/30/0762   Special screening for malignant neoplasms, colon 01/30/2017   Dysphagia 06/10/2016   Stricture and stenosis of esophagus 06/10/2016   Food impaction of esophagus 06/03/2016   Peripheral edema 12/04/2012   Morbid obesity  ([place]) 12/04/2012   Type II or unspecified type diabetes mellitus without mention of complication, not stated as uncontrolled 12/03/2012   Chest pain 12/03/2012   Dyspnea 12/03/2012   Insomnia 06/03/2012   Abnormal mammogram with microcalcification 04/23/2012   Combined hyperlipidemia associated with type 2 diabetes mellitus ([place]) 06/20/2011   Allergic rhinitis 01/10/2011   Mild concentric left ventricular hypertrophy (LVH) 11/05/2010   Hypothyroidism 06/07/2010   COPD (chronic obstructive pulmonary disease) ([place]) 03/28/2010   Bipolar I disorder, single manic episode ([place]) 02/19/2010   Obstructive sleep apnea 09/21/2009   Essential hypertension 12/27/2008   DIZZINESS 12/27/2008   DYSPNEA 12/27/2008    Past Surgical History:  Procedure Laterality Date   BALLOON DILATION N/A 06/10/2016   Procedure: BALLOON DILATION;  Surgeon: Wilford Corner, MD;  Location: [facility];  Service: Endoscopy;  Laterality: N/A;   BREAST EXCISIONAL BIOPSY Left 05/10/2012   benign   CHOLECYSTECTOMY     COLONOSCOPY WITH PROPOFOL N/A 01/30/2017   Procedure: COLONOSCOPY WITH PROPOFOL;  Surgeon: Wilford Corner, MD;  Location: [facility];  Service: Endoscopy;  Laterality: N/A;   COLONOSCOPY WITH PROPOFOL N/A 09/29/2020  Procedure: COLONOSCOPY WITH PROPOFOL;  Surgeon: Clarene Essex, MD;  Location: [facility] ENDOSCOPY;  Service: Endoscopy;  Laterality: N/A;   ESOPHAGEAL MANOMETRY N/A 12/19/2020   Procedure: ESOPHAGEAL MANOMETRY (EM);  Surgeon: Wilford Corner, MD;  Location: [facility];  Service: Endoscopy;  Laterality: N/A;   ESOPHAGOGASTRODUODENOSCOPY N/A 06/03/2016   Procedure: ESOPHAGOGASTRODUODENOSCOPY (EGD);  Surgeon: Wilford Corner, MD;  Location[facility] END[facility]COPY;  Service: Endoscopy;  Laterality: N/A;   ESOPHAGOGASTRODUODENOSCOPY N/A 06/10/2016   Procedure: ESOPHAGOGASTRODUODENOSCOPY (EGD);  Surgeon: Wilford Corner, MD;  Location: [facility] ENDOSCOPY;  Service: Endoscopy;  Laterality: N/A;    ESOPHAGOGASTRODUODENOSCOPY (EGD) WITH PROPOFOL N/A 09/28/2020   Procedure: ESOPHAGOGASTRODUODENOSCOPY (EGD) WITH PROPOFOL;  Surgeon: Arta Silence, MD;  Location: [facility];  Service: Endoscopy;  Laterality: N/A;   EYE SURGERY     PARTIAL MASTECTOMY WITH NEEDLE LOCALIZATION Left 05/10/2012   Procedure: PARTIAL MASTECTOMY WITH NEEDLE LOCALIZATION;  Surgeon: Adin Hector, MD;  Location: [facility];  Service: General;  Laterality: Left;   POLYPECTOMY  09/29/2020   Procedure: POLYPECTOMY;  Surgeon: Clarene Essex, MD;  Location: [facility] ENDOS[facility];  Service: Endoscopy;;   TONSILLECTOMY       OB History   No obstetric history on file.     Family History  Problem Relation Age of Onset   Alzheimer's disease Mother    Alcohol abuse Father    Stroke Father    Restless legs syndrome Neg Hx     Social History   Tobacco Use   Smoking status: Former    Packs/day: 3.00    Years: 29.00    Pack years: 87.00    Types: Cigarettes    Quit date: 05/03/1993    Years since quitting: 27.8   Smokeless tobacco: Never  Vaping Use   Vaping Use: Never used  Substance Use Topics   Alcohol use: No    Alcohol/week: 0.0 standard drinks   Drug use: No    Home Medications Prior to Admission medications   Medication Sig Start Date End Date Taking? Authorizing Provider  bumetanide (BUMEX) 1 MG tablet Take 1 tablet (1 mg total) by mouth daily. 12/04/12   Delfina Redwood, MD  Cholecalciferol (VITAMIN D3) 5000 units TABS Take 1 tablet by mouth daily.    [provider]  clonazePAM (KLONOPIN) 1 MG tablet Take 1 tablet (1 mg total) by mouth at bedtime. 03/04/21   Arfeen, Arlyce Harman, MD  diclofenac sodium (VOLTAREN) 1 % GEL Apply 2 g topically 4 (four) times daily as needed.    [provider]  FEROSUL 325 (65 Fe) MG tablet Take 325 mg by mouth daily. 06/21/18   [provider]  fluticasone furoate-vilanterol (BREO ELLIPTA) 200-25 MCG/INH AEPB Inhale 1 puff into the lungs every morning.     [provider]  Insulin Aspart (NOVOLOG IJ) Inject as directed.    [provider]  INVOKANA 300 MG TABS tablet Take 300 mg by mouth daily before breakfast.  04/03/16   [provider]  LEVEMIR 100 UNIT/ML injection Inject 30 Units into the skin daily. 07/21/18   [provider]  levothyroxine (SYNTHROID) 125 MCG tablet Take 125 mcg by mouth daily before breakfast. 07/21/18   [provider]  metFORMIN (GLUCOPHAGE) 500 MG tablet Take 500 mg by mouth daily. 01/23/20   [provider]  montelukast (SINGULAIR) 10 MG tablet Take 10 mg by mouth daily. 01/23/20   [provider]  nystatin ointment (MYCOSTATIN) Apply 1 application topically 2 (two) times daily.    [provider]  omeprazole (PRILOSEC) 40 MG capsule Take 40 mg by mouth daily. 01/23/20   [provider]  OZEMPIC, 1 MG/DOSE, 4 MG/3ML SOPN Inject 2 mg into the skin every evening. 01/23/20   [provider]  PARoxetine (PAXIL) 40 MG tablet Take 1 tablet (40 mg total) by mouth every morning. 03/04/21   Arfeen, Arlyce Harman, MD  potassium chloride (KLOR-CON 10) 10 MEQ tablet Take 1 tablet (10 mEq total) by mouth daily. Take with bumetanide 09/29/20 10/29/20  Dessa Phi, DO  rOPINIRole (REQUIP) 0.25 MG tablet TAKE 3 TABLETS BY MOUTH AT BEDTIME 12/24/20   Ward Givens, NP  rosuvastatin (CRESTOR) 5 MG tablet Take 5 mg by mouth daily. 07/20/18   [provider]  traZODone (DESYREL) 100 MG tablet Take 1 tablet (100 mg total) by mouth at bedtime. 03/04/21   Arfeen, Arlyce Harman, MD  TRUEPLUS INSULIN SYRINGE 29G X 1/2" 1 ML MISC  07/16/18   [provider]  VENTOLIN HFA 108 (90 Base) MCG/ACT inhaler Inhale 1-2 puffs into the lungs every 4 (four) hours as needed for wheezing or shortness of breath. 07/21/18   [provider]  ziprasidone (GEODON) 80 MG capsule Take 1 capsule (80 mg total) by mouth at bedtime. 03/04/21   Kathlee Nations, MD    Allergies     Darvocet [propoxyphene n-acetaminophen], Fluoxetine, Penicillins, and Promethazine  Review of Systems   Review of Systems  Respiratory:  Positive for cough and shortness of breath.   All other systems reviewed and are negative.  Physical Exam Updated Vital Signs BP (!) 138/57    Pulse 77    Temp 98.3 F (36.8 C) (Oral)    Resp 11    Ht 5\' 2"  (1.575 m)    Wt 86 kg    SpO2 95%    BMI 34.68 kg/m   Physical Exam Vitals and nursing note reviewed.  Constitutional:      Appearance: Normal appearance.  HENT:     Head: Normocephalic and atraumatic.     Right Ear: External ear normal.     Left Ear: External ear normal.     Nose: Nose normal.     Mouth/Throat:     Mouth: Mucous membranes are moist.     Pharynx: Oropharynx is clear.  Eyes:     Extraocular Movements: Extraocular movements intact.     Pupils: Pupils are equal, round, and reactive to light.  Cardiovascular:     Rate and Rhythm: Normal rate and regular rhythm.     Pulses: Normal pulses.     Heart sounds: Normal heart sounds.  Pulmonary:     Effort: Pulmonary effort is normal.     Breath sounds: Rhonchi present.  Abdominal:     General: Abdomen is flat. Bowel sounds are normal.     Palpations: Abdomen is soft.  Musculoskeletal:        General: Normal range of motion.     Cervical back: Normal range of motion and neck supple.  Skin:    General: Skin is warm.     Capillary Refill: Capillary refill takes less than 2 seconds.  Neurological:     General: No focal deficit present.     Mental Status: She is alert and oriented to person, place, and time.  Psychiatric:        Mood and Affect: Mood normal.        Behavior: Behavior normal.    ED Results / Procedures / Treatments   Labs (  all labs ordered are listed, but only abnormal results are displayed) Labs Reviewed  RESP PANEL BY RT-PCR (FLU A&B, COVID) ARPGX2 - Abnormal; Notable for the following components:      Result Value   Influenza A by PCR POSITIVE (*)     All other components within normal limits  BASIC METABOLIC PANEL - Abnormal; Notable for the following components:   Sodium 133 (*)    CO2 21 (*)    Glucose, Bld 363 (*)    BUN 28 (*)    Creatinine, Ser 1.88 (*)    Calcium 8.6 (*)    GFR, Estimated 29 (*)    All other components within normal limits  CBC WITH DIFFERENTIAL/PLATELET - Abnormal; Notable for the following components:   RBC 2.84 (*)    Hemoglobin 7.5 (*)    HCT 24.1 (*)    Neutro Abs 9.1 (*)    Lymphs Abs 0.4 (*)    Abs Immature Granulocytes 0.08 (*)    All other components within normal limits  LACTIC ACID, PLASMA - Abnormal; Notable for the following components:   Lactic Acid, Venous 4.2 (*)    All other components within normal limits  POC OCCULT BLOOD, ED - Abnormal; Notable for the following components:   Fecal Occult Bld POSITIVE (*)    All other components within normal limits  TROPONIN I (HIGH SENSITIVITY) - Abnormal; Notable for the following components:   Troponin I (High Sensitivity) 29 (*)    All other components within normal limits  CULTURE, BLOOD (ROUTINE X 2)  CULTURE, BLOOD (ROUTINE X 2)  BRAIN NATRIURETIC PEPTIDE  LACTIC ACID, PLASMA  HIV ANTIBODY (ROUTINE TESTING W REFLEX)  CBC  BASIC METABOLIC PANEL  FERRITIN  IRON AND TIBC  RETICULOCYTES  TYPE AND SCREEN  PREPARE RBC (CROSSMATCH)  TROPONIN I (HIGH SENSITIVITY)    EKG EKG Interpretation  Date/Time:  Thursday March 14 2021 16:28:19 EST Ventricular Rate:  83 PR Interval:  148 QRS Duration: 92 QT Interval:  408 QTC Calculation: 480 R Axis:   -2 Text Interpretation: Sinus rhythm Baseline wander in lead(s) I III aVL No significant change since last tracing Confirmed by Isla Pence [phone]) on 03/14/2021 4:31:15 PM  Radiology DG Chest Port 1 View  Result Date: 03/14/2021 CLINICAL DATA:  Shortness of breath EXAM: PORTABLE CHEST 1 VIEW COMPARISON:  None. FINDINGS: The heart size and mediastinal contours are within normal  limits. Both lungs are clear. No pleural effusion. The visualized skeletal structures are unremarkable. IMPRESSION: No active disease. Electronically Signed   By: Macy Mis M.D.   On: 03/14/2021 16:40    Procedures Procedures   Medications Ordered in ED Medications  0.9 %  sodium chloride infusion (has no administration in time range)  rosuvastatin (CRESTOR) tablet 5 mg (has no administration in time range)  levothyroxine (SYNTHROID) tablet 125 mcg (has no administration in time range)  pantoprazole (PROTONIX) EC tablet 40 mg (has no administration in time range)  fluticasone furoate-vilanterol (BREO ELLIPTA) 200-25 MCG/INH 1 puff (has no administration in time range)  montelukast (SINGULAIR) tablet 10 mg (has no administration in time range)  albuterol (VENTOLIN HFA) 108 (90 Base) MCG/ACT inhaler 1-2 puff (has no administration in time range)  oseltamivir (TAMIFLU) capsule 75 mg (75 mg Oral Given 03/14/21 1840)    ED Course  I have reviewed the triage vital signs and the nursing notes.  Pertinent labs & imaging results that were available during my care of the patient were reviewed by  me and considered in my medical decision making (see chart for details).    MDM Rules/Calculators/A&P                         Oxygenation is in the mid-90s with 2L oxygen.  She is + for the Flu.  CXR is clear.  She does have some AKI.  I will start her on Tamiflu as sx have been going on for 3 days and it may help some.  Pt is symptomatic from her anemia, so she is given 1 unit of prbcs for transfusion.  Pt d/w Dr. Michail Sermon [facility]) who will see pt in consult in the morning.  Pt d/w Dr. Trilby Drummer (triad) for admission.  CRITICAL CARE Performed by: Isla Pence   Total critical care time: 30 minutes  Critical care time was exclusive of separately billable procedures and treating other patients.  Critical care was necessary to treat or prevent imminent or life-threatening  deterioration.  Critical care was time spent personally by me on the following activities: development of treatment plan with patient and/or surrogate as well as nursing, discussions with consultants, evaluation of patient's response to treatment, examination of patient, obtaining history from patient or surrogate, ordering and performing treatments and interventions, ordering and review of laboratory studies, ordering and review of radiographic studies, pulse oximetry and re-evaluation of patient's condition.    Final Clinical Impression(s) / ED Diagnoses Final diagnoses:  Influenza A  Symptomatic anemia  Acute respiratory failure with hypoxia (HCC)  AKI (acute kidney injury) ([place])    Rx / DC Orders ED Discharge Orders     None        Isla Pence, MD 03/14/21 [phone]

## 2021-03-15 ENCOUNTER — Observation Stay (HOSPITAL_COMMUNITY): Payer: Medicare Other

## 2021-03-15 DIAGNOSIS — E1169 Type 2 diabetes mellitus with other specified complication: Secondary | ICD-10-CM | POA: Diagnosis present

## 2021-03-15 DIAGNOSIS — D62 Acute posthemorrhagic anemia: Secondary | ICD-10-CM | POA: Diagnosis present

## 2021-03-15 DIAGNOSIS — Z9049 Acquired absence of other specified parts of digestive tract: Secondary | ICD-10-CM | POA: Diagnosis not present

## 2021-03-15 DIAGNOSIS — F419 Anxiety disorder, unspecified: Secondary | ICD-10-CM | POA: Diagnosis present

## 2021-03-15 DIAGNOSIS — D649 Anemia, unspecified: Secondary | ICD-10-CM | POA: Diagnosis not present

## 2021-03-15 DIAGNOSIS — J309 Allergic rhinitis, unspecified: Secondary | ICD-10-CM | POA: Diagnosis present

## 2021-03-15 DIAGNOSIS — K921 Melena: Secondary | ICD-10-CM | POA: Diagnosis not present

## 2021-03-15 DIAGNOSIS — J449 Chronic obstructive pulmonary disease, unspecified: Secondary | ICD-10-CM | POA: Diagnosis present

## 2021-03-15 DIAGNOSIS — Z8719 Personal history of other diseases of the digestive system: Secondary | ICD-10-CM | POA: Diagnosis not present

## 2021-03-15 DIAGNOSIS — R195 Other fecal abnormalities: Secondary | ICD-10-CM | POA: Diagnosis present

## 2021-03-15 DIAGNOSIS — Z6834 Body mass index (BMI) 34.0-34.9, adult: Secondary | ICD-10-CM | POA: Diagnosis not present

## 2021-03-15 DIAGNOSIS — Z9012 Acquired absence of left breast and nipple: Secondary | ICD-10-CM | POA: Diagnosis not present

## 2021-03-15 DIAGNOSIS — K219 Gastro-esophageal reflux disease without esophagitis: Secondary | ICD-10-CM | POA: Diagnosis present

## 2021-03-15 DIAGNOSIS — J101 Influenza due to other identified influenza virus with other respiratory manifestations: Secondary | ICD-10-CM | POA: Diagnosis present

## 2021-03-15 DIAGNOSIS — Z79899 Other long term (current) drug therapy: Secondary | ICD-10-CM | POA: Diagnosis not present

## 2021-03-15 DIAGNOSIS — N179 Acute kidney failure, unspecified: Secondary | ICD-10-CM | POA: Diagnosis present

## 2021-03-15 DIAGNOSIS — G4733 Obstructive sleep apnea (adult) (pediatric): Secondary | ICD-10-CM | POA: Diagnosis present

## 2021-03-15 DIAGNOSIS — D500 Iron deficiency anemia secondary to blood loss (chronic): Secondary | ICD-10-CM | POA: Diagnosis not present

## 2021-03-15 DIAGNOSIS — I10 Essential (primary) hypertension: Secondary | ICD-10-CM | POA: Diagnosis present

## 2021-03-15 DIAGNOSIS — F319 Bipolar disorder, unspecified: Secondary | ICD-10-CM | POA: Diagnosis present

## 2021-03-15 DIAGNOSIS — K922 Gastrointestinal hemorrhage, unspecified: Secondary | ICD-10-CM | POA: Diagnosis present

## 2021-03-15 DIAGNOSIS — E669 Obesity, unspecified: Secondary | ICD-10-CM | POA: Diagnosis present

## 2021-03-15 DIAGNOSIS — E782 Mixed hyperlipidemia: Secondary | ICD-10-CM | POA: Diagnosis present

## 2021-03-15 DIAGNOSIS — Z20822 Contact with and (suspected) exposure to covid-19: Secondary | ICD-10-CM | POA: Diagnosis present

## 2021-03-15 DIAGNOSIS — I700 Atherosclerosis of aorta: Secondary | ICD-10-CM | POA: Diagnosis not present

## 2021-03-15 DIAGNOSIS — J9601 Acute respiratory failure with hypoxia: Secondary | ICD-10-CM | POA: Diagnosis present

## 2021-03-15 DIAGNOSIS — Z87891 Personal history of nicotine dependence: Secondary | ICD-10-CM | POA: Diagnosis not present

## 2021-03-15 DIAGNOSIS — E039 Hypothyroidism, unspecified: Secondary | ICD-10-CM | POA: Diagnosis present

## 2021-03-15 LAB — BASIC METABOLIC PANEL
Anion gap: 10 (ref 5–15)
BUN: 23 mg/dL (ref 8–23)
CO2: 27 mmol/L (ref 22–32)
Calcium: 8.5 mg/dL — ABNORMAL LOW (ref 8.9–10.3)
Chloride: 103 mmol/L (ref 98–111)
Creatinine, Ser: 1.31 mg/dL — ABNORMAL HIGH (ref 0.44–1.00)
GFR, Estimated: 44 mL/min — ABNORMAL LOW (ref >60)
Glucose, Bld: 170 mg/dL — ABNORMAL HIGH (ref 70–99)
Potassium: 3.9 mmol/L (ref 3.5–5.1)
Sodium: 140 mmol/L (ref 135–145)

## 2021-03-15 LAB — BPAM RBC
Blood Product Expiration Date: 202301112359
ISSUE DATE / TIME: 202212152313
Unit Type and Rh: 6200

## 2021-03-15 LAB — TYPE AND SCREEN
ABO/RH(D): A POS
Antibody Screen: POSITIVE
Unit division: 0

## 2021-03-15 LAB — CBC
HCT: 26.6 % — ABNORMAL LOW (ref 36.0–46.0)
Hemoglobin: 8.2 g/dL — ABNORMAL LOW (ref 12.0–15.0)
MCH: 25.7 pg — ABNORMAL LOW (ref 26.0–34.0)
MCHC: 30.8 g/dL (ref 30.0–36.0)
MCV: 83.4 fL (ref 80.0–100.0)
Platelets: 260 10*3/uL (ref 150–400)
RBC: 3.19 MIL/uL — ABNORMAL LOW (ref 3.87–5.11)
RDW: 13.4 % (ref 11.5–15.5)
WBC: 7.4 10*3/uL (ref 4.0–10.5)
nRBC: 0 % (ref 0.0–0.2)

## 2021-03-15 LAB — LACTIC ACID, PLASMA: Lactic Acid, Venous: 0.6 mmol/L (ref 0.5–1.9)

## 2021-03-15 LAB — IRON AND TIBC
Iron: 22 ug/dL — ABNORMAL LOW (ref 28–170)
Saturation Ratios: 7 % — ABNORMAL LOW (ref 10.4–31.8)
TIBC: 303 ug/dL (ref 250–450)
UIBC: 281 ug/dL

## 2021-03-15 LAB — CBG MONITORING, ED
Glucose-Capillary: 109 mg/dL — ABNORMAL HIGH (ref 70–99)
Glucose-Capillary: 101 mg/dL — ABNORMAL HIGH (ref 70–99)
Glucose-Capillary: 148 mg/dL — ABNORMAL HIGH (ref 70–99)

## 2021-03-15 LAB — FERRITIN: Ferritin: 18 ng/mL (ref 11–307)

## 2021-03-15 LAB — RETICULOCYTES
Immature Retic Fract: 20.7 % — ABNORMAL HIGH (ref 2.3–15.9)
RBC.: 3.18 MIL/uL — ABNORMAL LOW (ref 3.87–5.11)
Retic Count, Absolute: 33.7 10*3/uL (ref 19.0–186.0)
Retic Ct Pct: 1.1 % (ref 0.4–3.1)

## 2021-03-15 LAB — GLUCOSE, CAPILLARY: Glucose-Capillary: 212 mg/dL — ABNORMAL HIGH (ref 70–99)

## 2021-03-15 LAB — HIV ANTIBODY (ROUTINE TESTING W REFLEX): HIV Screen 4th Generation wRfx: NONREACTIVE

## 2021-03-15 MED ORDER — SODIUM CHLORIDE 0.9 % IV SOLN: INTRAVENOUS | Status: DC

## 2021-03-15 MED ORDER — BENZONATATE 100 MG PO CAPS
200.0000 mg | ORAL_CAPSULE | Freq: Three times a day (TID) | ORAL | Status: DC | PRN
  Administered 2021-03-15: 200 mg via ORAL
  Filled 2021-03-15: qty 2

## 2021-03-15 MED ORDER — IOHEXOL 350 MG/ML SOLN
68.0000 mL | Freq: Once | INTRAVENOUS | Status: AC | PRN
  Administered 2021-03-15: 68 mL via INTRAVENOUS

## 2021-03-15 NOTE — Progress Notes (Signed)
PROGRESS NOTE    Chelsea [name]  ERX:[id number] DOB: 1950/12/04 DOA: 03/14/2021 PCP: [facility]    Brief Narrative:  70 y.o. female with medical history significant of anemia, anxiety, bipolar, hyperlipidemia, COPD, diabetes, hypertension, hypothyroidism, OSA, obesity presenting shortness of breath. Pt noted to have O2 sat of 80% in ED requiring O2. Also noted to have worsening anemia in the setting of heme pos stools  Assessment & Plan:   Principal Problem:   GI bleed Active Problems:   Essential hypertension   Type 2 diabetes mellitus without complication (HCC)   Diabetes mellitus without complication (HCC)   Bipolar I disorder, single manic episode ([place])   Combined hyperlipidemia associated with type 2 diabetes mellitus (HCC)   COPD (chronic obstructive pulmonary disease) (HCC)   Hypothyroidism   Acute blood loss anemia   Anxiety   HLD (hyperlipidemia)   AKI (acute kidney injury) ([place])   Influenza A  Symptomatic anemia GI bleed > Patient presenting with shortness of breath found to have hemoglobin down to 7.5 from previous of 8.5.  Noted to have heme positive stool.  1 unit of transfusion has been ordered. > Does have history of prior symptomatic anemia with endoscopy in July.  Colonoscopy at that time did not show any source of bleeding and EGD significant for gastritis. > GI consulted, recs for CT abd, may consider f/u capsule endoscopy pending CT results   Influenza A > Reported fever few days ago . - Continue home Tamiflu -Multiple sick contacts  AKI > Creatinine elevated to 1.88 from baseline around 1.4. - Continued on IVF hydration - Recheck bmet in AM   Hyperlipidemia - Continue home statin as tolerated  Hypothyroidism - Continue home Synthroid   COPD - Continue home Breo, as needed albuterol   Diabetes > On oral medications and 30 units of long-acting at home  - 20 units daily and SSI  -Glycemic trends stable, reviewed    Anxiety Bipolar - Continue home Paxil, trazodone, Geodon - Continue home Klonopin    DVT prophylaxis: SCD's Code Status: Full Family Communication: Pt in room, family at bedside  Status is: Observation  The patient will require care spanning > 2 midnights and should be moved to inpatient because: severity of illness   Consultants:  GI  Procedures:    Antimicrobials: Anti-infectives (From admission, onward)    Start     Dose/Rate Route Frequency Ordered Stop   03/15/21 1000  oseltamivir (TAMIFLU) capsule 30 mg        30 mg Oral 2 times daily 03/14/21 2034     03/14/21 1815  oseltamivir (TAMIFLU) capsule 75 mg        75 mg Oral  Once 03/14/21 1809 03/14/21 1840       Subjective: Feeling hungry this AM, eager to eat  Objective: Vitals:   03/15/21 1130 03/15/21 1200 03/15/21 1230 03/15/21 1432  BP: (!) 141/61 (!) 150/61 (!) 147/60 (!) 158/67  Pulse: 62 64 61 68  Resp: 16 13 12 12   Temp:      TempSrc:      SpO2: 97% 95% 95% 90%  Weight:      Height:        Intake/Output Summary (Last 24 hours) at 03/15/2021 1541 Last data filed at 03/15/2021 1101 Gross per 24 hour  Intake 1700 ml  Output --  Net 1700 ml   Filed Weights   03/14/21 1624 03/15/21 1000  Weight: 86 kg 86 kg  Examination: General exam: Awake, laying in bed, in nad Respiratory system: Normal respiratory effort, no wheezing Cardiovascular system: regular rate, s1, s2 Gastrointestinal system: Soft, nondistended, positive BS Central nervous system: CN2-12 grossly intact, strength intact Extremities: Perfused, no clubbing Skin: Normal skin turgor, no notable skin lesions seen Psychiatry: Mood normal // no visual hallucinations   Data Reviewed: I have personally reviewed following labs and imaging studies  CBC: Recent Labs  Lab 03/14/21 1700 03/15/21 0515  WBC 9.7 7.4  NEUTROABS 9.1*  --   HGB 7.5* 8.2*  HCT 24.1* 26.6*  MCV 84.9 83.4  PLT 276 124   Basic Metabolic Panel: Recent  Labs  Lab 03/14/21 1700 03/15/21 0515  NA 133* 140  K 3.6 3.9  CL 99 103  CO2 21* 27  GLUCOSE 363* 170*  BUN 28* 23  CREATININE 1.88* 1.31*  CALCIUM 8.6* 8.5*   GFR: Estimated Creatinine Clearance: 41.9 mL/min (A) (by C-G formula based on SCr of 1.31 mg/dL (H)). Liver Function Tests: No results for input(s): AST, ALT, ALKPHOS, BILITOT, PROT, ALBUMIN in the last 168 hours. No results for input(s): LIPASE, AMYLASE in the last 168 hours. No results for input(s): AMMONIA in the last 168 hours. Coagulation Profile: No results for input(s): INR, PROTIME in the last 168 hours. Cardiac Enzymes: No results for input(s): CKTOTAL, CKMB, CKMBINDEX, TROPONINI in the last 168 hours. BNP (last 3 results) No results for input(s): PROBNP in the last 8760 hours. HbA1C: No results for input(s): HGBA1C in the last 72 hours. CBG: Recent Labs  Lab 03/14/21 2303 03/15/21 0942 03/15/21 1213  GLUCAP 199* 109* 101*   Lipid Profile: No results for input(s): CHOL, HDL, LDLCALC, TRIG, CHOLHDL, LDLDIRECT in the last 72 hours. Thyroid Function Tests: No results for input(s): TSH, T4TOTAL, FREET4, T3FREE, THYROIDAB in the last 72 hours. Anemia Panel: Recent Labs    03/15/21 0515  FERRITIN 18  TIBC 303  IRON 22*  RETICCTPCT 1.1   Sepsis Labs: Recent Labs  Lab 03/14/21 1700 03/14/21 1841 03/15/21 0515  LATICACIDVEN 4.2* 3.4* 0.6    Recent Results (from the past 240 hour(s))  Resp Panel by RT-PCR (Flu A&B, Covid) Nasopharyngeal Swab     Status: Abnormal   Collection Time: 03/14/21  5:13 PM   Specimen: Nasopharyngeal Swab; Nasopharyngeal(NP) swabs in vial transport medium  Result Value Ref Range Status   SARS Coronavirus 2 by RT PCR NEGATIVE NEGATIVE Final    Comment: (NOTE) SARS-CoV-2 target nucleic acids are NOT DETECTED.  The SARS-CoV-2 RNA is generally detectable in upper respiratory specimens during the acute phase of infection. The lowest concentration of SARS-CoV-2 viral copies  this assay can detect is 138 copies/mL. A negative result does not preclude SARS-Cov-2 infection and should not be used as the sole basis for treatment or other patient management decisions. A negative result may occur with  improper specimen collection/handling, submission of specimen other than nasopharyngeal swab, presence of viral mutation(s) within the areas targeted by this assay, and inadequate number of viral copies(<138 copies/mL). A negative result must be combined with clinical observations, patient history, and epidemiological information. The expected result is Negative.  Fact Sheet for Patients:  EntrepreneurPulse.com.au  Fact Sheet for Healthcare Providers:  IncredibleEmployment.be  This test is no t yet approved or cleared by the [place] FDA and  has been authorized for detection and/or diagnosis of SARS-CoV-2 by FDA under an Emergency Use Authorization (EUA). This EUA will remain  in effect (meaning this test can be used)  for the duration of the COVID-19 declaration under Section 564(b)(1) of the Act, 21 U.S.C.section 360bbb-3(b)(1), unless the authorization is terminated  or revoked sooner.       Influenza A by PCR POSITIVE (A) NEGATIVE Final   Influenza B by PCR NEGATIVE NEGATIVE Final    Comment: (NOTE) The Xpert Xpress SARS-CoV-2/FLU/RSV plus assay is intended as an aid in the diagnosis of influenza from Nasopharyngeal swab specimens and should not be used as a sole basis for treatment. Nasal washings and aspirates are unacceptable for Xpert Xpress SARS-CoV-2/FLU/RSV testing.  Fact Sheet for Patients: EntrepreneurPulse.com.au  Fact Sheet for Healthcare Providers: IncredibleEmployment.be  This test is not yet approved or cleared by the [place] FDA and has been authorized for detection and/or diagnosis of SARS-CoV-2 by FDA under an Emergency Use Authorization (EUA). This  EUA will remain in effect (meaning this test can be used) for the duration of the COVID-19 declaration under Section 564(b)(1) of the Act, 21 U.S.C. section 360bbb-3(b)(1), unless the authorization is terminated or revoked.  Performed at [facility], [place] [address] [place], [place] [address]      Radiology Studies: CT ABDOMEN PELVIS W CONTRAST  Result Date: 03/15/2021 CLINICAL DATA:  Anemia.  GI blood loss. EXAM: CT ABDOMEN AND PELVIS WITH CONTRAST TECHNIQUE: Multidetector CT imaging of the abdomen and pelvis was performed using the standard protocol following bolus administration of intravenous contrast. CONTRAST:  64mL OMNIPAQUE IOHEXOL 350 MG/ML SOLN COMPARISON:  09/19/2013 FINDINGS: Lower chest: Dependent atelectasis noted in both lung bases. Hepatobiliary: Small area of low attenuation in the anterior liver, adjacent to the falciform ligament, is in a characteristic location for focal fatty deposition. No suspicious focal abnormality within the liver parenchyma. Trace intrahepatic biliary duct prominence without extrahepatic biliary duct dilatation. Common bile duct is 6 mm diameter in the head of the pancreas. Gallbladder surgically absent. Pancreas: No focal mass lesion. No dilatation of the main duct. No intraparenchymal cyst. No peripancreatic edema. Spleen: No splenomegaly. No focal mass lesion. Adrenals/Urinary Tract: No adrenal nodule or mass. Tiny hypoattenuating lesions in each kidney are too small to characterize but likely benign cysts. No evidence for hydroureter. The urinary bladder appears normal for the degree of distention. Stomach/Bowel: Stomach is unremarkable. No gastric wall thickening. No evidence of outlet obstruction. Duodenum is normally positioned as is the ligament of Treitz. No small bowel wall thickening. No small bowel dilatation. The terminal ileum is normal. The appendix is normal. No gross colonic mass. No colonic wall thickening.  Vascular/Lymphatic: There is moderate atherosclerotic calcification of the abdominal aorta without aneurysm. There is no gastrohepatic or hepatoduodenal ligament lymphadenopathy. No retroperitoneal or mesenteric lymphadenopathy. No pelvic sidewall lymphadenopathy. Reproductive: The uterus is unremarkable.  There is no adnexal mass. Other: No intraperitoneal free fluid. Musculoskeletal: No worrisome lytic or sclerotic osseous abnormality. IMPRESSION: 1. No acute findings in the abdomen or pelvis. Specifically, no findings to explain the patient's history of anemia and GI bleeding. 2. Aortic Atherosclerosis (ICD10-I70.0). Electronically Signed   By: Misty Stanley M.D.   On: 03/15/2021 14:12   DG Chest Port 1 View  Result Date: 03/14/2021 CLINICAL DATA:  Shortness of breath EXAM: PORTABLE CHEST 1 VIEW COMPARISON:  None. FINDINGS: The heart size and mediastinal contours are within normal limits. Both lungs are clear. No pleural effusion. The visualized skeletal structures are unremarkable. IMPRESSION: No active disease. Electronically Signed   By: Macy Mis M.D.   On: 03/14/2021 16:40    Scheduled Meds:  clonazePAM  1 mg Oral QHS   fluticasone furoate-vilanterol  1 puff Inhalation Daily   insulin aspart  0-15 Units Subcutaneous TID WC   insulin aspart  0-5 Units Subcutaneous QHS   insulin glargine-yfgn  20 Units Subcutaneous QHS   levothyroxine  125 mcg Oral Q0600   montelukast  10 mg Oral Daily   oseltamivir  30 mg Oral BID   pantoprazole  40 mg Oral Daily   PARoxetine  40 mg Oral q morning   rosuvastatin  5 mg Oral Daily   traZODone  100 mg Oral QHS   ziprasidone  80 mg Oral QHS   Continuous Infusions:  sodium chloride Stopped (03/14/21 2049)   sodium chloride 100 mL/hr at 03/15/21 1101     LOS: 0 days   Marylu Lund, MD [facility] Pager On Amion  If 7PM-7AM, please contact night-coverage 03/15/2021, 3:41 PM

## 2021-03-15 NOTE — Consult Note (Signed)
Referring Provider: [facility] Primary Care Physician:  [facility] Primary Gastroenterologist:  Dr. Michail Sermon  Reason for Consultation:  Symptomatic anemia  HPI: Chelsea [name] is a 70 y.o. female medical history significant of anemia, anxiety, bipolar, hyperlipidemia, COPD, diabetes, hypertension, hypothyroidism, OSA, obesity presents for symptomatic anemia.  Patient originally presented to ED with 2-3 days of shortness of breath. Upon arrival, hgb was 7.5. She reported fever and cough a few days ago as well. In ED, saturating 80%, improved on 2LPM O2. Patient states she went to PCP and was told her hgb was low so she came to ED. She reported shortness of breath, denies chest pain. Denies melena/hematochezia. Denies nausea/vomiting. Denies abdominal pain. Denies dysphagia. Denies NSAID use  EGD 09/2020 done for anemia/melena: Esophageal plaques found, consistent with candidiasis, gastritis, normal duodenum, no clear source of anemia was identified.  Patient was given fluconazole for Candida esophagitis Colonoscopy 09/2020 done for anemia/melena: Internal hemorrhoids, 2 small tubular adenomas in the transverse and distal ascending colon, ileum normal Esophageal manometry 11/2020: Normal  Previous EGDs 06/03/2016: Food in the esophagus, removed.  Acute gastritis. 06/10/2016: Mild Schatzki ring, dilated   Past Medical History:  Diagnosis Date   Anxiety    Arthritis    Bipolar disorder (HCC)    COPD (chronic obstructive pulmonary disease) (HCC)    Diabetes mellitus type II    Esophageal stricture    moderate distal   GERD (gastroesophageal reflux disease)    Heart murmur    "HAD AN ECHO YEARS AGO- NOTHING TO BE CONCERNED ABOUT"   Hyperlipemia    Hypertension    Hypothyroidism    Mental disorder    Bipolar   Obesity    Oxygen desaturation during sleep    wears 2 liters at bedtime    Schizophrenia ([place])    patient reports that she is unaware of this   Shortness of breath     with a little activy   Sleep apnea    wears 2 liters of oxygen at bedtime instead of CPAP    Past Surgical History:  Procedure Laterality Date   BALLOON DILATION N/A 06/10/2016   Procedure: BALLOON DILATION;  Surgeon: Wilford Corner, MD;  Location: [place];  Service: Endoscopy;  Laterality: N/A;   BREAST EXCISIONAL BIOPSY Left 05/10/2012   benign   CHOLECYSTECTOMY     COLONOSCOPY WITH PROPOFOL N/A 01/30/2017   Procedure: COLONOSCOPY WITH PROPOFOL;  Surgeon: Wilford Corner, MD;  Location: [place];  Service: Endoscopy;  Laterality: N/A;   COLONOSCOPY WITH PROPOFOL N/A 09/29/2020   Procedure: COLONOSCOPY WITH PROPOFOL;  Surgeon: Clarene Essex, MD;  Location: [facility] ENDOSCOPY;  Service: Endoscopy;  Laterality: N/A;   ESOPHAGEAL MANOMETRY N/A 12/19/2020   Procedure: ESOPHAGEAL MANOMETRY (EM);  Surgeon: Wilford Corner, MD;  Location: [facility]OS[facility];  Service: Endoscopy;  Laterality: N/A;   ESOPHAGOGASTRODUODENOSCOPY N/A 06/03/2016   Procedure: ESOPHAGOGASTRODUODENOSCOPY (EGD);  Surgeon: Wilford Corner, MD;  Location[facility] ENDOSCOPY;  Service: Endoscopy;  Laterality: N/A;   ESOPHAGOGASTRODUODENOSCOPY N/A 06/10/2016   Procedure: ESOPHAGOGASTRODUODENOSCOPY (EGD);  Surgeon: Wilford Corner, MD;  Location: [facility] ENDOSCOPY;  Service: Endoscopy;  Laterality: N/A;   ESOPHAGOGASTRODUODENOSCOPY (EGD) WITH PROPOFOL N/A 09/28/2020   Procedure: ESOPHAGOGASTRODUODENOSCOPY (EGD) WITH PROPOFOL;  Surgeon: Arta Silence, MD;  Location: [facility] ENDOSCOPY;  Service: Endoscopy;  Laterality: N/A;   EYE SURGERY     PARTIAL MASTECTOMY WITH NEEDLE LOCALIZATION Left 05/10/2012   Procedure: PARTIAL MASTECTOMY WITH NEEDLE LOCALIZATION;  Surgeon: Adin Hector, MD;  Location: [place];  Service: General;  Laterality: Left;   POLYPECTOMY  09/29/2020   Procedure: POLYPECTOMY;  Surgeon: Clarene Essex, MD;  Location: [facility] ENDOSCOPY;  Service: Endoscopy;;   TONSILLECTOMY      Prior to Admission medications   Medication Sig Start Date  End Date Taking? Authorizing Provider  albuterol (PROVENTIL) (2.5 MG/3ML) 0.083% nebulizer solution SMARTSIG:3 Milliliter(s) Via Nebulizer Every 6 Hours PRN 03/13/21   [provider]  azithromycin (ZITHROMAX) 250 MG tablet Take by mouth. 03/13/21   [provider]  benzonatate (TESSALON) 100 MG capsule Take by mouth. 03/08/21   [provider]  bumetanide (BUMEX) 1 MG tablet Take 1 tablet (1 mg total) by mouth daily. 12/04/12   Delfina Redwood, MD  Cholecalciferol (VITAMIN D3) 5000 units TABS Take 1 tablet by mouth daily.    [provider]  clonazePAM (KLONOPIN) 1 MG tablet Take 1 tablet (1 mg total) by mouth at bedtime. 03/04/21   Arfeen, Arlyce Harman, MD  diclofenac sodium (VOLTAREN) 1 % GEL Apply 2 g topically 4 (four) times daily as needed.    [provider]  FEROSUL 325 (65 Fe) MG tablet Take 325 mg by mouth daily. 06/21/18   [provider]  fluticasone furoate-vilanterol (BREO ELLIPTA) 200-25 MCG/INH AEPB Inhale 1 puff into the lungs every morning.    [provider]  Insulin Aspart (NOVOLOG IJ) Inject as directed.    [provider]  INVOKANA 300 MG TABS tablet Take 300 mg by mouth daily before breakfast.  04/03/16   [provider]  LEVEMIR 100 UNIT/ML injection Inject 30 Units into the skin daily. 07/21/18   [provider]  levothyroxine (SYNTHROID) 125 MCG tablet Take 125 mcg by mouth daily before breakfast. 07/21/18   [provider]  metFORMIN (GLUCOPHAGE) 500 MG tablet Take 500 mg by mouth daily. 01/23/20   [provider]  methylPREDNISolone (MEDROL) 4 MG tablet Take 4 mg by mouth as directed. 03/14/21   [provider]  montelukast (SINGULAIR) 10 MG tablet Take 10 mg by mouth daily. 01/23/20   [provider]  nystatin ointment (MYCOSTATIN) Apply 1 application topically 2 (two) times daily.    [provider]  omeprazole (PRILOSEC) 40 MG capsule Take 40 mg  by mouth daily. 01/23/20   [provider]  OZEMPIC, 1 MG/DOSE, 4 MG/3ML SOPN Inject 2 mg into the skin every evening. 01/23/20   [provider]  PARoxetine (PAXIL) 40 MG tablet Take 1 tablet (40 mg total) by mouth every morning. 03/04/21   Arfeen, Arlyce Harman, MD  potassium chloride (KLOR-CON 10) 10 MEQ tablet Take 1 tablet (10 mEq total) by mouth daily. Take with bumetanide 09/29/20 10/29/20  Dessa Phi, DO  rOPINIRole (REQUIP) 0.25 MG tablet TAKE 3 TABLETS BY MOUTH AT BEDTIME 12/24/20   Ward Givens, NP  rosuvastatin (CRESTOR) 5 MG tablet Take 5 mg by mouth daily. 07/20/18   [provider]  traZODone (DESYREL) 100 MG tablet Take 1 tablet (100 mg total) by mouth at bedtime. 03/04/21   Arfeen, Arlyce Harman, MD  TRUEPLUS INSULIN SYRINGE 29G X 1/2" 1 ML MISC  07/16/18   [provider]  VENTOLIN HFA 108 (90 Base) MCG/ACT inhaler Inhale 1-2 puffs into the lungs every 4 (four) hours as needed for wheezing or shortness of breath. 07/21/18   [provider]  ziprasidone (GEODON) 80 MG capsule Take 1 capsule (80 mg total) by mouth at bedtime. 03/04/21   Kathlee Nations, MD  Scheduled Meds:  clonazePAM  1 mg Oral QHS   fluticasone furoate-vilanterol  1 puff Inhalation Daily   insulin aspart  0-15 Units Subcutaneous TID WC   insulin aspart  0-5 Units Subcutaneous QHS   insulin glargine-yfgn  20 Units Subcutaneous QHS   levothyroxine  125 mcg Oral Q0600   montelukast  10 mg Oral Daily   oseltamivir  30 mg Oral BID   pantoprazole  40 mg Oral Daily   PARoxetine  40 mg Oral q morning   rosuvastatin  5 mg Oral Daily   traZODone  100 mg Oral QHS   ziprasidone  80 mg Oral QHS   Continuous Infusions:  sodium chloride Stopped (03/14/21 2049)   PRN Meds:.albuterol  Allergies as of 03/14/2021 - Review Complete 03/14/2021  Allergen Reaction Noted   Darvocet [propoxyphene n-acetaminophen] Anaphylaxis 07/11/2011   Fluoxetine Other (See Comments) 05/03/2012   Penicillins  Rash 10/07/2007   Promethazine Hives 04/13/2018    Family History  Problem Relation Age of Onset   Alzheimer's disease Mother    Alcohol abuse Father    Stroke Father    Restless legs syndrome Neg Hx     Social History   Socioeconomic History   Marital status: Divorced    Spouse name: Not on file   Number of children: 0   Years of education: HS   Highest education level: Not on file  Occupational History   Occupation: Retired   Tobacco Use   Smoking status: Former    Packs/day: 3.00    Years: 29.00    Pack years: 87.00    Types: Cigarettes    Quit date: 05/03/1993    Years since quitting: 27.8   Smokeless tobacco: Never  Vaping Use   Vaping Use: Never used  Substance and Sexual Activity   Alcohol use: No    Alcohol/week: 0.0 standard drinks   Drug use: No   Sexual activity: Not Currently  Other Topics Concern   Not on file  Social History Narrative   Drinks 2 cups of coffee a day   Social Determinants of Health   Financial Resource Strain: Not on file  Food Insecurity: Not on file  Transportation Needs: Not on file  Physical Activity: Not on file  Stress: Not on file  Social Connections: Not on file  Intimate Partner Violence: Not on file    Review of Systems: Review of Systems  Constitutional:  Negative for chills and fever.  HENT:  Negative for hearing loss and tinnitus.   Eyes:  Negative for blurred vision and double vision.  Respiratory:  Positive for shortness of breath. Negative for cough and hemoptysis.   Cardiovascular:  Negative for chest pain and palpitations.  Gastrointestinal:  Negative for abdominal pain, blood in stool, constipation, diarrhea, heartburn, melena, nausea and vomiting.  Genitourinary:  Negative for dysuria and urgency.  Musculoskeletal:  Negative for myalgias and neck pain.  Skin:  Negative for itching and rash.  Neurological:  Negative for seizures and loss of consciousness.  Psychiatric/Behavioral:  Negative for substance  abuse. The patient is not nervous/anxious.     Physical Exam:Physical Exam Constitutional:      Appearance: Normal appearance.  HENT:     Head: Normocephalic and atraumatic.     Nose: Nose normal. No congestion.     Mouth/Throat:     Mouth: Mucous membranes are moist.     Pharynx: Oropharynx is clear.  Eyes:     Extraocular Movements: Extraocular movements intact.  Comments: Conjunctival pallor  Cardiovascular:     Rate and Rhythm: Normal rate and regular rhythm.  Pulmonary:     Effort: Pulmonary effort is normal. No respiratory distress.  Abdominal:     General: Abdomen is flat. Bowel sounds are normal. There is no distension.     Palpations: Abdomen is soft. There is no mass.     Tenderness: There is no abdominal tenderness. There is no guarding or rebound.     Hernia: No hernia is present.  Musculoskeletal:        General: No swelling. Normal range of motion.     Cervical back: Normal range of motion and neck supple.  Skin:    General: Skin is warm.     Coloration: Skin is not jaundiced.  Neurological:     General: No focal deficit present.     Mental Status: She is alert and oriented to person, place, and time.  Psychiatric:        Mood and Affect: Mood normal.        Behavior: Behavior normal.        Thought Content: Thought content normal.        Judgment: Judgment normal.    Vital signs: Vitals:   03/15/21 0630 03/15/21 0700  BP: (!) 142/53 120/84  Pulse: 69 67  Resp: 16 12  Temp:    SpO2: 97% 97%        GI:  Lab Results: Recent Labs    03/14/21 1700 03/15/21 0515  WBC 9.7 7.4  HGB 7.5* 8.2*  HCT 24.1* 26.6*  PLT 276 260   BMET Recent Labs    03/14/21 1700 03/15/21 0515  NA 133* 140  K 3.6 3.9  CL 99 103  CO2 21* 27  GLUCOSE 363* 170*  BUN 28* 23  CREATININE 1.88* 1.31*  CALCIUM 8.6* 8.5*   LFT No results for input(s): PROT, ALBUMIN, AST, ALT, ALKPHOS, BILITOT, BILIDIR, IBILI in the last 72 hours. PT/INR No results for input(s):  LABPROT, INR in the last 72 hours.   Studies/Results: DG Chest Port 1 View  Result Date: 03/14/2021 CLINICAL DATA:  Shortness of breath EXAM: PORTABLE CHEST 1 VIEW COMPARISON:  None. FINDINGS: The heart size and mediastinal contours are within normal limits. Both lungs are clear. No pleural effusion. The visualized skeletal structures are unremarkable. IMPRESSION: No active disease. Electronically Signed   By: Macy Mis M.D.   On: 03/14/2021 16:40    Impression: Symptomatic anemia - EGD/Colon 09/2020: unrevealing for source of anemia. -Hgb 8.2, MCV normal (baseline hgb appears 8-10) -Iron 22, TIBC normal, ferritin 18 -BUN 23, creatinine 1.31  COPD  Diabetes  Plan: Recurrent symptomatic anemia with recent unrevealing EGD/Colonoscopy. No recent CT imaging of abdomen/pelvis Will obtain CT ab/pelvis with contrast. If CT is unrevealing, could possibly consider capsule endoscopy for evaluation of anemia. Continue daily CBC and transfuse as needed to maintain HGB > 7  [facility] will follow  LOS: 0 days   Madline Oesterling Radford Pax  PA-C 03/15/2021, 8:16 AM  Contact #  [phone]

## 2021-03-16 LAB — GLUCOSE, CAPILLARY
Glucose-Capillary: 155 mg/dL — ABNORMAL HIGH (ref 70–99)
Glucose-Capillary: 133 mg/dL — ABNORMAL HIGH (ref 70–99)
Glucose-Capillary: 175 mg/dL — ABNORMAL HIGH (ref 70–99)
Glucose-Capillary: 230 mg/dL — ABNORMAL HIGH (ref 70–99)

## 2021-03-16 LAB — COMPREHENSIVE METABOLIC PANEL
ALT: 11 U/L (ref 0–44)
AST: 14 U/L — ABNORMAL LOW (ref 15–41)
Albumin: 2.6 g/dL — ABNORMAL LOW (ref 3.5–5.0)
Alkaline Phosphatase: 71 U/L (ref 38–126)
Anion gap: 6 (ref 5–15)
BUN: 18 mg/dL (ref 8–23)
CO2: 26 mmol/L (ref 22–32)
Calcium: 8.2 mg/dL — ABNORMAL LOW (ref 8.9–10.3)
Chloride: 103 mmol/L (ref 98–111)
Creatinine, Ser: 1.29 mg/dL — ABNORMAL HIGH (ref 0.44–1.00)
GFR, Estimated: 45 mL/min — ABNORMAL LOW (ref >60)
Glucose, Bld: 152 mg/dL — ABNORMAL HIGH (ref 70–99)
Potassium: 4.4 mmol/L (ref 3.5–5.1)
Sodium: 135 mmol/L (ref 135–145)
Total Bilirubin: 0.4 mg/dL (ref 0.3–1.2)
Total Protein: 5.9 g/dL — ABNORMAL LOW (ref 6.5–8.1)

## 2021-03-16 LAB — CBC
HCT: 27.8 % — ABNORMAL LOW (ref 36.0–46.0)
Hemoglobin: 8.6 g/dL — ABNORMAL LOW (ref 12.0–15.0)
MCH: 26.1 pg (ref 26.0–34.0)
MCHC: 30.9 g/dL (ref 30.0–36.0)
MCV: 84.2 fL (ref 80.0–100.0)
Platelets: 315 10*3/uL (ref 150–400)
RBC: 3.3 MIL/uL — ABNORMAL LOW (ref 3.87–5.11)
RDW: 13.3 % (ref 11.5–15.5)
WBC: 13.8 10*3/uL — ABNORMAL HIGH (ref 4.0–10.5)
nRBC: 0 % (ref 0.0–0.2)

## 2021-03-16 MED ORDER — NYSTATIN 100000 UNIT/GM EX POWD
Freq: Two times a day (BID) | CUTANEOUS | Status: DC
  Filled 2021-03-16: qty 15

## 2021-03-16 MED ORDER — ACETAMINOPHEN 325 MG PO TABS
650.0000 mg | ORAL_TABLET | Freq: Four times a day (QID) | ORAL | Status: DC | PRN
  Administered 2021-03-16 – 2021-03-17 (×2): 650 mg via ORAL
  Filled 2021-03-16 (×2): qty 2

## 2021-03-16 MED ORDER — GUAIFENESIN 100 MG/5ML PO LIQD
5.0000 mL | ORAL | Status: DC | PRN
  Administered 2021-03-16 – 2021-03-17 (×3): 5 mL via ORAL
  Filled 2021-03-16 (×3): qty 10

## 2021-03-16 MED ORDER — ONDANSETRON HCL 4 MG/2ML IJ SOLN
4.0000 mg | Freq: Four times a day (QID) | INTRAMUSCULAR | Status: DC | PRN
  Administered 2021-03-16: 4 mg via INTRAVENOUS
  Filled 2021-03-16: qty 2

## 2021-03-16 NOTE — Progress Notes (Signed)
[facility] Progress Note  Chelsea [name] 70 y.o. 1950-11-02  CC: Anemia, heme positive stool   Subjective: Patient seen and examined at bedside.  Denies any GI symptoms.  Denies overt bleeding.  ROS : Positive for fever.  Negative for chest pain.  Positive for shortness of breath.   Objective: Vital signs in last 24 hours: Vitals:   03/16/21 0925 03/16/21 0932  BP:  (!) 131/58  Pulse:  92  Resp:  20  Temp: 98.2 F (36.8 C) 98 F (36.7 C)  SpO2:  94%    Physical Exam:  General:  Alert, cooperative, no distress, appears stated age, oxygen by nasal cannula  Head:  Normocephalic, without obvious abnormality, atraumatic  Eyes:  , EOM's intact,   Lungs:   No visible respiratory distress,  Heart:  Regular rate and rhythm, S1, S2 normal  Abdomen:   Soft, non-tender, nondistended, bowel sounds present          Lab Results: Recent Labs    03/15/21 0515 03/16/21 0608  NA 140 135  K 3.9 4.4  CL 103 103  CO2 27 26  GLUCOSE 170* 152*  BUN 23 18  CREATININE 1.31* 1.29*  CALCIUM 8.5* 8.2*   Recent Labs    03/16/21 0608  AST 14*  ALT 11  ALKPHOS 71  BILITOT 0.4  PROT 5.9*  ALBUMIN 2.6*   Recent Labs    03/14/21 1700 03/15/21 0515 03/16/21 0608  WBC 9.7 7.4 13.8*  NEUTROABS 9.1*  --   --   HGB 7.5* 8.2* 8.6*  HCT 24.1* 26.6* 27.8*  MCV 84.9 83.4 84.2  PLT 276 260 315   No results for input(s): LABPROT, INR in the last 72 hours.    Assessment/Plan: -Anemia with heme positive stool.  EGD and colonoscopy in July 2022 showed no evidence of active bleeding.  Was diagnosed with candidal esophagitis and was treated with fluconazole. -Influenza.  Now having fever. -COPD  Recommendation ---------------------- -CT abdomen pelvis with IV contrast yesterday showed no acute changes -Patient's hemoglobin is stable and no overt bleeding. - currently having fever probably from influenza A. -Recommend outpatient work-up for anemia with capsule endoscopy  as an outpatient. -Discussed with hospitalist. -Follow-up with GI in 4 to 6-week after discharge -GI will sign off.  Call us back if needed  Otis Brace MD, [place] 03/16/2021, 10:05 AM  Contact #  [phone]

## 2021-03-16 NOTE — Progress Notes (Signed)
°   03/16/21 0724  Vitals  Temp (!) 101.8 F (38.8 C)  Temp Source Oral  BP (!) 165/61  MAP (mmHg) 91  BP Location Right Arm  BP Method Automatic  Patient Position (if appropriate) Lying  Pulse Rate (!) 106  Pulse Rate Source Monitor  Resp (!) 21  MEWS COLOR  MEWS Score Color Red  Oxygen Therapy  SpO2 (!) 89 %  MEWS Score  MEWS Temp 2  MEWS Systolic 0  MEWS Pulse 1  MEWS RR 1  MEWS LOC 0  MEWS Score 4   Patient noted to be in a red mews. MD paged. Charge nurse notified. Rapid response called. See new orders. Ice applied to patients neck, groin, and under the arms. Tylenol given. Room temperature turned down and blankets removed. Patient pulled up in the bed and oxygen slightly turned up to 3 liters. Patient now satting at 94%.

## 2021-03-16 NOTE — Progress Notes (Signed)
PROGRESS NOTE    Chelsea [name]  SNK:[id number] DOB: 06/25/1950 DOA: 03/14/2021 PCP: [facility]    Brief Narrative:  70 y.o. female with medical history significant of anemia, anxiety, bipolar, hyperlipidemia, COPD, diabetes, hypertension, hypothyroidism, OSA, obesity presenting shortness of breath. Pt noted to have O2 sat of 80% in ED requiring O2. Also noted to have worsening anemia in the setting of heme pos stools  Assessment & Plan:   Principal Problem:   GI bleed Active Problems:   Essential hypertension   Type 2 diabetes mellitus without complication (HCC)   Diabetes mellitus without complication (HCC)   Bipolar I disorder, single manic episode ([place])   Combined hyperlipidemia associated with type 2 diabetes mellitus (HCC)   COPD (chronic obstructive pulmonary disease) (HCC)   Hypothyroidism   Acute blood loss anemia   Anxiety   HLD (hyperlipidemia)   AKI (acute kidney injury) ([place])   Influenza A  Symptomatic anemia GI bleed > Patient presenting with shortness of breath found to have hemoglobin down to 7.5 from previous of 8.5.  Noted to have heme positive stool.  1 unit of transfusion has been ordered. > Does have history of prior symptomatic anemia with endoscopy in July.  Colonoscopy at that time did not show any source of bleeding and EGD significant for gastritis. > GI consulted. Discussed with GI. Hgb has remained stable overnight -Per GI, outpt workup with capsule study with GI follow up in 4-6 weeks   Influenza A > Reported fever few days ago . - Continue home Tamiflu -Multiple sick contacts -Remained on 3LNC this AM, weaning as tolerated -No wheezing  AKI > Creatinine elevated to 1.88 from baseline around 1.4. - Continued on IVF hydration - Recheck bmet in AM   Hyperlipidemia - Continue home statin as tolerated  Hypothyroidism - Continue home Synthroid   COPD - Continue home Breo, as needed albuterol   Diabetes > On oral  medications and 30 units of long-acting at home  - 20 units daily and SSI  -Glycemic trends stable, reviewed   Anxiety Bipolar - Continue home Paxil, trazodone, Geodon - Continue home Klonopin    DVT prophylaxis: SCD's Code Status: Full Family Communication: Pt in room, family at bedside  Status is: Observation  The patient will require care spanning > 2 midnights and should be moved to inpatient because: severity of illness   Consultants:  GI  Procedures:    Antimicrobials: Anti-infectives (From admission, onward)    Start     Dose/Rate Route Frequency Ordered Stop   03/15/21 1000  oseltamivir (TAMIFLU) capsule 30 mg        30 mg Oral 2 times daily 03/14/21 2034     03/14/21 1815  oseltamivir (TAMIFLU) capsule 75 mg        75 mg Oral  Once 03/14/21 1809 03/14/21 1840       Subjective: Eager to advance diet  Objective: Vitals:   03/16/21 0932 03/16/21 1030 03/16/21 1134 03/16/21 1540  BP: (!) 131/58 124/62 (!) 114/47 (!) 131/56  Pulse: 92 85 81 80  Resp: 20 19 19 20   Temp: 98 F (36.7 C) 98.2 F (36.8 C) 98.2 F (36.8 C) 98.4 F (36.9 C)  TempSrc:   Oral Oral  SpO2: 94% 98% 98% 100%  Weight:      Height:        Intake/Output Summary (Last 24 hours) at 03/16/2021 1555 Last data filed at 03/16/2021 0554 Gross per 24 hour  Intake  800 ml  Output 225 ml  Net 575 ml    Filed Weights   03/14/21 1624 03/15/21 1000 03/16/21 0500  Weight: 86 kg 86 kg 83 kg    Examination: General exam: Conversant, in no acute distress Respiratory system: normal chest rise, clear, no audible wheezing Cardiovascular system: regular rhythm, s1-s2 Gastrointestinal system: Nondistended, nontender, pos BS Central nervous system: No seizures, no tremors Extremities: No cyanosis, no joint deformities Skin: No rashes, no pallor Psychiatry: Affect normal // no auditory hallucinations   Data Reviewed: I have personally reviewed following labs and imaging  studies  CBC: Recent Labs  Lab 03/14/21 1700 03/15/21 0515 03/16/21 0608  WBC 9.7 7.4 13.8*  NEUTROABS 9.1*  --   --   HGB 7.5* 8.2* 8.6*  HCT 24.1* 26.6* 27.8*  MCV 84.9 83.4 84.2  PLT 276 260 944    Basic Metabolic Panel: Recent Labs  Lab 03/14/21 1700 03/15/21 0515 03/16/21 0608  NA 133* 140 135  K 3.6 3.9 4.4  CL 99 103 103  CO2 21* 27 26  GLUCOSE 363* 170* 152*  BUN 28* 23 18  CREATININE 1.88* 1.31* 1.29*  CALCIUM 8.6* 8.5* 8.2*    GFR: Estimated Creatinine Clearance: 41.7 mL/min (A) (by C-G formula based on SCr of 1.29 mg/dL (H)). Liver Function Tests: Recent Labs  Lab 03/16/21 0608  AST 14*  ALT 11  ALKPHOS 71  BILITOT 0.4  PROT 5.9*  ALBUMIN 2.6*   No results for input(s): LIPASE, AMYLASE in the last 168 hours. No results for input(s): AMMONIA in the last 168 hours. Coagulation Profile: No results for input(s): INR, PROTIME in the last 168 hours. Cardiac Enzymes: No results for input(s): CKTOTAL, CKMB, CKMBINDEX, TROPONINI in the last 168 hours. BNP (last 3 results) No results for input(s): PROBNP in the last 8760 hours. HbA1C: No results for input(s): HGBA1C in the last 72 hours. CBG: Recent Labs  Lab 03/15/21 1213 03/15/21 1729 03/15/21 2150 03/16/21 0746 03/16/21 1136  GLUCAP 101* 148* 212* 155* 133*    Lipid Profile: No results for input(s): CHOL, HDL, LDLCALC, TRIG, CHOLHDL, LDLDIRECT in the last 72 hours. Thyroid Function Tests: No results for input(s): TSH, T4TOTAL, FREET4, T3FREE, THYROIDAB in the last 72 hours. Anemia Panel: Recent Labs    03/15/21 0515  FERRITIN 18  TIBC 303  IRON 22*  RETICCTPCT 1.1    Sepsis Labs: Recent Labs  Lab 03/14/21 1700 03/14/21 1841 03/15/21 0515  LATICACIDVEN 4.2* 3.4* 0.6     Recent Results (from the past 240 hour(s))  Resp Panel by RT-PCR (Flu A&B, Covid) Nasopharyngeal Swab     Status: Abnormal   Collection Time: 03/14/21  5:13 PM   Specimen: Nasopharyngeal Swab;  Nasopharyngeal(NP) swabs in vial transport medium  Result Value Ref Range Status   SARS Coronavirus 2 by RT PCR NEGATIVE NEGATIVE Final    Comment: (NOTE) SARS-CoV-2 target nucleic acids are NOT DETECTED.  The SARS-CoV-2 RNA is generally detectable in upper respiratory specimens during the acute phase of infection. The lowest concentration of SARS-CoV-2 viral copies this assay can detect is 138 copies/mL. A negative result does not preclude SARS-Cov-2 infection and should not be used as the sole basis for treatment or other patient management decisions. A negative result may occur with  improper specimen collection/handling, submission of specimen other than nasopharyngeal swab, presence of viral mutation(s) within the areas targeted by this assay, and inadequate number of viral copies(<138 copies/mL). A negative result must be combined with clinical  observations, patient history, and epidemiological information. The expected result is Negative.  Fact Sheet for Patients:  EntrepreneurPulse.com.au  Fact Sheet for Healthcare Providers:  IncredibleEmployment.be  This test is no t yet approved or cleared by the [place] FDA and  has been authorized for detection and/or diagnosis of SARS-CoV-2 by FDA under an Emergency Use Authorization (EUA). This EUA will remain  in effect (meaning this test can be used) for the duration of the COVID-19 declaration under Section 564(b)(1) of the Act, 21 U.S.C.section 360bbb-3(b)(1), unless the authorization is terminated  or revoked sooner.       Influenza A by PCR POSITIVE (A) NEGATIVE Final   Influenza B by PCR NEGATIVE NEGATIVE Final    Comment: (NOTE) The Xpert Xpress SARS-CoV-2/FLU/RSV plus assay is intended as an aid in the diagnosis of influenza from Nasopharyngeal swab specimens and should not be used as a sole basis for treatment. Nasal washings and aspirates are unacceptable for Xpert Xpress  SARS-CoV-2/FLU/RSV testing.  Fact Sheet for Patients: EntrepreneurPulse.com.au  Fact Sheet for Healthcare Providers: IncredibleEmployment.be  This test is not yet approved or cleared by the [place] FDA and has been authorized for detection and/or diagnosis of SARS-CoV-2 by FDA under an Emergency Use Authorization (EUA). This EUA will remain in effect (meaning this test can be used) for the duration of the COVID-19 declaration under Section 564(b)(1) of the Act, 21 U.S.C. section 360bbb-3(b)(1), unless the authorization is terminated or revoked.  Performed at [facility], [place] [address] [place], [place] [address]       Radiology Studies: CT ABDOMEN PELVIS W CONTRAST  Result Date: 03/15/2021 CLINICAL DATA:  Anemia.  GI blood loss. EXAM: CT ABDOMEN AND PELVIS WITH CONTRAST TECHNIQUE: Multidetector CT imaging of the abdomen and pelvis was performed using the standard protocol following bolus administration of intravenous contrast. CONTRAST:  22mL OMNIPAQUE IOHEXOL 350 MG/ML SOLN COMPARISON:  09/19/2013 FINDINGS: Lower chest: Dependent atelectasis noted in both lung bases. Hepatobiliary: Small area of low attenuation in the anterior liver, adjacent to the falciform ligament, is in a characteristic location for focal fatty deposition. No suspicious focal abnormality within the liver parenchyma. Trace intrahepatic biliary duct prominence without extrahepatic biliary duct dilatation. Common bile duct is 6 mm diameter in the head of the pancreas. Gallbladder surgically absent. Pancreas: No focal mass lesion. No dilatation of the main duct. No intraparenchymal cyst. No peripancreatic edema. Spleen: No splenomegaly. No focal mass lesion. Adrenals/Urinary Tract: No adrenal nodule or mass. Tiny hypoattenuating lesions in each kidney are too small to characterize but likely benign cysts. No evidence for hydroureter. The urinary bladder  appears normal for the degree of distention. Stomach/Bowel: Stomach is unremarkable. No gastric wall thickening. No evidence of outlet obstruction. Duodenum is normally positioned as is the ligament of Treitz. No small bowel wall thickening. No small bowel dilatation. The terminal ileum is normal. The appendix is normal. No gross colonic mass. No colonic wall thickening. Vascular/Lymphatic: There is moderate atherosclerotic calcification of the abdominal aorta without aneurysm. There is no gastrohepatic or hepatoduodenal ligament lymphadenopathy. No retroperitoneal or mesenteric lymphadenopathy. No pelvic sidewall lymphadenopathy. Reproductive: The uterus is unremarkable.  There is no adnexal mass. Other: No intraperitoneal free fluid. Musculoskeletal: No worrisome lytic or sclerotic osseous abnormality. IMPRESSION: 1. No acute findings in the abdomen or pelvis. Specifically, no findings to explain the patient's history of anemia and GI bleeding. 2. Aortic Atherosclerosis (ICD10-I70.0). Electronically Signed   By: Misty Stanley M.D.   On: 03/15/2021 14:12  DG Chest Port 1 View  Result Date: 03/14/2021 CLINICAL DATA:  Shortness of breath EXAM: PORTABLE CHEST 1 VIEW COMPARISON:  None. FINDINGS: The heart size and mediastinal contours are within normal limits. Both lungs are clear. No pleural effusion. The visualized skeletal structures are unremarkable. IMPRESSION: No active disease. Electronically Signed   By: Macy Mis M.D.   On: 03/14/2021 16:40    Scheduled Meds:  clonazePAM  1 mg Oral QHS   fluticasone furoate-vilanterol  1 puff Inhalation Daily   insulin aspart  0-15 Units Subcutaneous TID WC   insulin aspart  0-5 Units Subcutaneous QHS   insulin glargine-yfgn  20 Units Subcutaneous QHS   levothyroxine  125 mcg Oral Q0600   montelukast  10 mg Oral Daily   oseltamivir  30 mg Oral BID   pantoprazole  40 mg Oral Daily   PARoxetine  40 mg Oral q morning   rosuvastatin  5 mg Oral Daily    traZODone  100 mg Oral QHS   ziprasidone  80 mg Oral QHS   Continuous Infusions:  sodium chloride Stopped (03/14/21 2049)   sodium chloride 100 mL/hr at 03/16/21 0554     LOS: 1 day   Marylu Lund, MD [facility] Pager On Amion  If 7PM-7AM, please contact night-coverage 03/16/2021, 3:55 PM

## 2021-03-17 LAB — COMPREHENSIVE METABOLIC PANEL
ALT: 11 U/L (ref 0–44)
AST: 9 U/L — ABNORMAL LOW (ref 15–41)
Albumin: 2.5 g/dL — ABNORMAL LOW (ref 3.5–5.0)
Alkaline Phosphatase: 69 U/L (ref 38–126)
Anion gap: 6 (ref 5–15)
BUN: 16 mg/dL (ref 8–23)
CO2: 25 mmol/L (ref 22–32)
Calcium: 8.4 mg/dL — ABNORMAL LOW (ref 8.9–10.3)
Chloride: 109 mmol/L (ref 98–111)
Creatinine, Ser: 1.19 mg/dL — ABNORMAL HIGH (ref 0.44–1.00)
GFR, Estimated: 50 mL/min — ABNORMAL LOW (ref >60)
Glucose, Bld: 128 mg/dL — ABNORMAL HIGH (ref 70–99)
Potassium: 4.5 mmol/L (ref 3.5–5.1)
Sodium: 140 mmol/L (ref 135–145)
Total Bilirubin: 0.4 mg/dL (ref 0.3–1.2)
Total Protein: 5.6 g/dL — ABNORMAL LOW (ref 6.5–8.1)

## 2021-03-17 LAB — CBC
HCT: 28.7 % — ABNORMAL LOW (ref 36.0–46.0)
Hemoglobin: 8.6 g/dL — ABNORMAL LOW (ref 12.0–15.0)
MCH: 25.7 pg — ABNORMAL LOW (ref 26.0–34.0)
MCHC: 30 g/dL (ref 30.0–36.0)
MCV: 85.9 fL (ref 80.0–100.0)
Platelets: 292 10*3/uL (ref 150–400)
RBC: 3.34 MIL/uL — ABNORMAL LOW (ref 3.87–5.11)
RDW: 13.2 % (ref 11.5–15.5)
WBC: 11.2 10*3/uL — ABNORMAL HIGH (ref 4.0–10.5)
nRBC: 0 % (ref 0.0–0.2)

## 2021-03-17 LAB — GLUCOSE, CAPILLARY
Glucose-Capillary: 116 mg/dL — ABNORMAL HIGH (ref 70–99)
Glucose-Capillary: 287 mg/dL — ABNORMAL HIGH (ref 70–99)

## 2021-03-17 MED ORDER — BENZONATATE 200 MG PO CAPS: 200.0000 mg | ORAL_CAPSULE | Freq: Three times a day (TID) | ORAL | 0 refills | Status: DC | PRN

## 2021-03-17 MED ORDER — OSELTAMIVIR PHOSPHATE 30 MG PO CAPS: 30.0000 mg | ORAL_CAPSULE | Freq: Two times a day (BID) | ORAL | 0 refills | Status: DC

## 2021-03-17 NOTE — Discharge Summary (Signed)
Physician Discharge Summary  Chelsea [name] EXB:[id number] DOB: 1950/07/14 DOA: 03/14/2021  PCP: [facility]  Admit date: 03/14/2021 Discharge date: 03/17/2021  Admitted From: Home Disposition:  Home  Recommendations for Outpatient Follow-up:  Follow up with PCP in 1-2 weeks Follow up with GI in 3-4 weeks  Discharge Condition:Improved CODE STATUS:Full Diet recommendation: Diabetic   Brief/Interim Summary: 70 y.o. female with medical history significant of anemia, anxiety, bipolar, hyperlipidemia, COPD, diabetes, hypertension, hypothyroidism, OSA, obesity presenting shortness of breath. Pt noted to have O2 sat of 80% in ED requiring O2. Also noted to have worsening anemia in the setting of heme pos stools  Discharge Diagnoses:  Principal Problem:   GI bleed Active Problems:   Essential hypertension   Type 2 diabetes mellitus without complication (HCC)   Diabetes mellitus without complication (HCC)   Bipolar I disorder, single manic episode ([place])   Combined hyperlipidemia associated with type 2 diabetes mellitus (HCC)   COPD (chronic obstructive pulmonary disease) (HCC)   Hypothyroidism   Acute blood loss anemia   Anxiety   HLD (hyperlipidemia)   AKI (acute kidney injury) ([place])   Influenza A  Symptomatic anemia GI bleed > Patient presenting with shortness of breath found to have hemoglobin down to 7.5 from previous of 8.5.  Noted to have heme positive stool.  1 unit of transfusion has been ordered. > Does have history of prior symptomatic anemia with endoscopy in July.  Colonoscopy at that time did not show any source of bleeding and EGD significant for gastritis. > GI consulted. Discussed with GI. Hgb has remained stable overnight -Per GI, outpt workup with capsule study with GI follow up in 4-6 weeks   Influenza A > Reported fever few days ago . - Continue home Tamiflu -Multiple sick contacts -Weaned to [facility] Pt has home O2 concentrator at home -No  wheezing  AKI > Creatinine elevated to 1.88 from baseline around 1.4. - Continued on IVF hydration   Hyperlipidemia - Continue home statin as tolerated  Hypothyroidism - Continue home Synthroid   COPD - Continue home Breo, as needed albuterol   Diabetes > On oral medications and 30 units of long-acting at home  - 20 units daily and SSI while in hospital -Glycemic trends stable, reviewed   Anxiety Bipolar - Continue home Paxil, trazodone, Geodon - Continue home Klonopin    Discharge Instructions   Allergies as of 03/17/2021       Reactions   Darvocet [propoxyphene N-acetaminophen] Anaphylaxis   Can take plain tylenol   Fluoxetine Other (See Comments)   Hallucinations    Penicillins Rash   REACTION: rash   Promethazine Hives        Medication List     STOP taking these medications    potassium chloride 10 MEQ tablet Commonly known as: Klor-Con 10       TAKE these medications    benzonatate 200 MG capsule Commonly known as: TESSALON Take 1 capsule (200 mg total) by mouth 3 (three) times daily as needed for cough.   bumetanide 1 MG tablet Commonly known as: BUMEX Take 1 tablet (1 mg total) by mouth daily.   chlorpheniramine-HYDROcodone 10-8 MG/5ML Suer Commonly known as: TUSSIONEX Take 5 mLs by mouth every 12 (twelve) hours as needed. cough   clonazePAM 1 MG tablet Commonly known as: KLONOPIN Take 1 tablet (1 mg total) by mouth at bedtime.   diclofenac sodium 1 % Gel Commonly known as: VOLTAREN Apply 2 g topically 4 (four)  times daily as needed (pain).   FeroSul 325 (65 FE) MG tablet Generic drug: ferrous sulfate Take 325 mg by mouth daily.   fluticasone furoate-vilanterol 200-25 MCG/INH Aepb Commonly known as: BREO ELLIPTA Inhale 1 puff into the lungs every morning.   Invokana 300 MG Tabs tablet Generic drug: canagliflozin Take 300 mg by mouth daily before breakfast.   Levemir 100 UNIT/ML injection Generic drug: insulin  detemir Inject 30 Units into the skin daily.   levothyroxine 125 MCG tablet Commonly known as: SYNTHROID Take 125 mcg by mouth daily before breakfast.   metFORMIN 500 MG tablet Commonly known as: GLUCOPHAGE Take 500 mg by mouth daily.   methylPREDNISolone 4 MG tablet Commonly known as: MEDROL Take 4 mg by mouth as directed.   montelukast 10 MG tablet Commonly known as: SINGULAIR Take 10 mg by mouth daily.   NOVOLOG IJ Inject 1-15 Units as directed See admin instructions. As directed Per sliding scale.   nystatin ointment Commonly known as: MYCOSTATIN Apply 1 application topically 2 (two) times daily.   omeprazole 40 MG capsule Commonly known as: PRILOSEC Take 40 mg by mouth daily.   oseltamivir 30 MG capsule Commonly known as: TAMIFLU Take 1 capsule (30 mg total) by mouth 2 (two) times daily for 3 days.   Ozempic (1 MG/DOSE) 4 MG/3ML Sopn Generic drug: Semaglutide (1 MG/DOSE) Inject 2 mg into the skin once a week. tuesdays   PARoxetine 40 MG tablet Commonly known as: PAXIL Take 1 tablet (40 mg total) by mouth every morning.   rOPINIRole 0.25 MG tablet Commonly known as: REQUIP TAKE 3 TABLETS BY MOUTH AT BEDTIME What changed: how to take this   rosuvastatin 5 MG tablet Commonly known as: CRESTOR Take 5 mg by mouth daily.   traZODone 100 MG tablet Commonly known as: DESYREL Take 1 tablet (100 mg total) by mouth at bedtime.   TRUEplus Insulin Syringe 29G X 1/2" 1 ML Misc Generic drug: INSULIN SYRINGE 1CC/29G 1 each by Other route daily.   Ventolin HFA 108 (90 Base) MCG/ACT inhaler Generic drug: albuterol Inhale 1-2 puffs into the lungs every 4 (four) hours as needed for wheezing or shortness of breath.   albuterol (2.5 MG/3ML) 0.083% nebulizer solution Commonly known as: PROVENTIL Take 2.5 mg by nebulization every 6 (six) hours as needed for wheezing or shortness of breath.   Vitamin D3 125 MCG (5000 UT) Tabs Take 1 tablet by mouth daily.    ziprasidone 80 MG capsule Commonly known as: GEODON Take 1 capsule (80 mg total) by mouth at bedtime.        Follow-up Information     Wilford Corner, MD. Schedule an appointment as soon as possible for a visit in 4 week(s).   Specialty: Gastroenterology Why: for Outpatient capsule endoscopy Contact information: [address] [place] [address] [phone]         [facility] Follow up in 2 week(s).   Why: Hospital follow up Contact information: [place] [address] [phone]                Allergies  Allergen Reactions   Darvocet [Propoxyphene N-Acetaminophen] Anaphylaxis    Can take plain tylenol   Fluoxetine Other (See Comments)    Hallucinations    Penicillins Rash    REACTION: rash   Promethazine Hives    Consultations: GI  Procedures/Studies: CT ABDOMEN PELVIS W CONTRAST  Result Date: 03/15/2021 CLINICAL DATA:  Anemia.  GI  blood loss. EXAM: CT ABDOMEN AND PELVIS WITH CONTRAST TECHNIQUE: Multidetector CT imaging of the abdomen and pelvis was performed using the standard protocol following bolus administration of intravenous contrast. CONTRAST:  38mL OMNIPAQUE IOHEXOL 350 MG/ML SOLN COMPARISON:  09/19/2013 FINDINGS: Lower chest: Dependent atelectasis noted in both lung bases. Hepatobiliary: Small area of low attenuation in the anterior liver, adjacent to the falciform ligament, is in a characteristic location for focal fatty deposition. No suspicious focal abnormality within the liver parenchyma. Trace intrahepatic biliary duct prominence without extrahepatic biliary duct dilatation. Common bile duct is 6 mm diameter in the head of the pancreas. Gallbladder surgically absent. Pancreas: No focal mass lesion. No dilatation of the main duct. No intraparenchymal cyst. No peripancreatic edema. Spleen: No splenomegaly. No focal mass lesion. Adrenals/Urinary Tract: No adrenal nodule or mass. Tiny  hypoattenuating lesions in each kidney are too small to characterize but likely benign cysts. No evidence for hydroureter. The urinary bladder appears normal for the degree of distention. Stomach/Bowel: Stomach is unremarkable. No gastric wall thickening. No evidence of outlet obstruction. Duodenum is normally positioned as is the ligament of Treitz. No small bowel wall thickening. No small bowel dilatation. The terminal ileum is normal. The appendix is normal. No gross colonic mass. No colonic wall thickening. Vascular/Lymphatic: There is moderate atherosclerotic calcification of the abdominal aorta without aneurysm. There is no gastrohepatic or hepatoduodenal ligament lymphadenopathy. No retroperitoneal or mesenteric lymphadenopathy. No pelvic sidewall lymphadenopathy. Reproductive: The uterus is unremarkable.  There is no adnexal mass. Other: No intraperitoneal free fluid. Musculoskeletal: No worrisome lytic or sclerotic osseous abnormality. IMPRESSION: 1. No acute findings in the abdomen or pelvis. Specifically, no findings to explain the patient's history of anemia and GI bleeding. 2. Aortic Atherosclerosis (ICD10-I70.0). Electronically Signed   By: Misty Stanley M.D.   On: 03/15/2021 14:12   DG Chest Port 1 View  Result Date: 03/14/2021 CLINICAL DATA:  Shortness of breath EXAM: PORTABLE CHEST 1 VIEW COMPARISON:  None. FINDINGS: The heart size and mediastinal contours are within normal limits. Both lungs are clear. No pleural effusion. The visualized skeletal structures are unremarkable. IMPRESSION: No active disease. Electronically Signed   By: Macy Mis M.D.   On: 03/14/2021 16:40    Subjective: Eager to go home  Discharge Exam: Vitals:   03/16/21 2328 03/17/21 0325  BP: (!) 137/54 (!) 151/54  Pulse: 86 86  Resp: 18 20  Temp: 99.6 F (37.6 C) 98.6 F (37 C)  SpO2: 96% 93%   Vitals:   03/16/21 1942 03/16/21 2328 03/17/21 0324 03/17/21 0325  BP: (!) 147/56 (!) 137/54  (!) 151/54   Pulse: 79 86  86  Resp: 18 18  20   Temp: 98.7 F (37.1 C) 99.6 F (37.6 C)  98.6 F (37 C)  TempSrc: Oral Oral  Oral  SpO2: 100% 96%  93%  Weight:   85.2 kg   Height:        General: Pt is alert, awake, not in acute distress Cardiovascular: RRR, S1/S2 + Respiratory: CTA bilaterally, no wheezing, no rhonchi Abdominal: Soft, NT, ND, bowel sounds + Extremities: no edema, no cyanosis   The results of significant diagnostics from this hospitalization (including imaging, microbiology, ancillary and laboratory) are listed below for reference.     Microbiology: Recent Results (from the past 240 hour(s))  Resp Panel by RT-PCR (Flu A&B, Covid) Nasopharyngeal Swab     Status: Abnormal   Collection Time: 03/14/21  5:13 PM   Specimen: Nasopharyngeal Swab; Nasopharyngeal(NP) swabs  in vial transport medium  Result Value Ref Range Status   SARS Coronavirus 2 by RT PCR NEGATIVE NEGATIVE Final    Comment: (NOTE) SARS-CoV-2 target nucleic acids are NOT DETECTED.  The SARS-CoV-2 RNA is generally detectable in upper respiratory specimens during the acute phase of infection. The lowest concentration of SARS-CoV-2 viral copies this assay can detect is 138 copies/mL. A negative result does not preclude SARS-Cov-2 infection and should not be used as the sole basis for treatment or other patient management decisions. A negative result may occur with  improper specimen collection/handling, submission of specimen other than nasopharyngeal swab, presence of viral mutation(s) within the areas targeted by this assay, and inadequate number of viral copies(<138 copies/mL). A negative result must be combined with clinical observations, patient history, and epidemiological information. The expected result is Negative.  Fact Sheet for Patients:  EntrepreneurPulse.com.au  Fact Sheet for Healthcare Providers:  IncredibleEmployment.be  This test is no t yet approved  or cleared by the [place] FDA and  has been authorized for detection and/or diagnosis of SARS-CoV-2 by FDA under an Emergency Use Authorization (EUA). This EUA will remain  in effect (meaning this test can be used) for the duration of the COVID-19 declaration under Section 564(b)(1) of the Act, 21 U.S.C.section 360bbb-3(b)(1), unless the authorization is terminated  or revoked sooner.       Influenza A by PCR POSITIVE (A) NEGATIVE Final   Influenza B by PCR NEGATIVE NEGATIVE Final    Comment: (NOTE) The Xpert Xpress SARS-CoV-2/FLU/RSV plus assay is intended as an aid in the diagnosis of influenza from Nasopharyngeal swab specimens and should not be used as a sole basis for treatment. Nasal washings and aspirates are unacceptable for Xpert Xpress SARS-CoV-2/FLU/RSV testing.  Fact Sheet for Patients: EntrepreneurPulse.com.au  Fact Sheet for Healthcare Providers: IncredibleEmployment.be  This test is not yet approved or cleared by the [place] FDA and has been authorized for detection and/or diagnosis of SARS-CoV-2 by FDA under an Emergency Use Authorization (EUA). This EUA will remain in effect (meaning this test can be used) for the duration of the COVID-19 declaration under Section 564(b)(1) of the Act, 21 U.S.C. section 360bbb-3(b)(1), unless the authorization is terminated or revoked.  Performed at [facility], [place] [address], [place], [place] [address]      Labs: BNP (last 3 results) Recent Labs    03/14/21 1700  BNP 25.6   Basic Metabolic Panel: Recent Labs  Lab 03/14/21 1700 03/15/21 0515 03/16/21 0608 03/17/21 0541  NA 133* 140 135 140  K 3.6 3.9 4.4 4.5  CL 99 103 103 109  CO2 21* 27 26 25   GLUCOSE 363* 170* 152* 128*  BUN 28* 23 18 16   CREATININE 1.88* 1.31* 1.29* 1.19*  CALCIUM 8.6* 8.5* 8.2* 8.4*   Liver Function Tests: Recent Labs  Lab 03/16/21 0608 03/17/21 0541  AST 14* 9*   ALT 11 11  ALKPHOS 71 69  BILITOT 0.4 0.4  PROT 5.9* 5.6*  ALBUMIN 2.6* 2.5*   No results for input(s): LIPASE, AMYLASE in the last 168 hours. No results for input(s): AMMONIA in the last 168 hours. CBC: Recent Labs  Lab 03/14/21 1700 03/15/21 0515 03/16/21 0608 03/17/21 0541  WBC 9.7 7.4 13.8* 11.2*  NEUTROABS 9.1*  --   --   --   HGB 7.5* 8.2* 8.6* 8.6*  HCT 24.1* 26.6* 27.8* 28.7*  MCV 84.9 83.4 84.2 85.9  PLT 276 260 315 292   Cardiac Enzymes:  No results for input(s): CKTOTAL, CKMB, CKMBINDEX, TROPONINI in the last 168 hours. BNP: Invalid input(s): POCBNP CBG: Recent Labs  Lab 03/16/21 0746 03/16/21 1136 03/16/21 1622 03/16/21 1942 03/17/21 0746  GLUCAP 155* 133* 175* 230* 116*   D-Dimer No results for input(s): DDIMER in the last 72 hours. Hgb A1c No results for input(s): HGBA1C in the last 72 hours. Lipid Profile No results for input(s): CHOL, HDL, LDLCALC, TRIG, CHOLHDL, LDLDIRECT in the last 72 hours. Thyroid function studies No results for input(s): TSH, T4TOTAL, T3FREE, THYROIDAB in the last 72 hours.  Invalid input(s): FREET3 Anemia work up Recent Labs    03/15/21 0515  FERRITIN 18  TIBC 303  IRON 22*  RETICCTPCT 1.1   Urinalysis    Component Value Date/Time   COLORURINE YELLOW 09/27/2020 1846   APPEARANCEUR CLEAR 09/27/2020 1846   LABSPEC 1.005 09/27/2020 1846   PHURINE 6.0 09/27/2020 1846   GLUCOSEU NEGATIVE 09/27/2020 1846   HGBUR NEGATIVE 09/27/2020 1846   BILIRUBINUR NEGATIVE 09/27/2020 1846   KETONESUR NEGATIVE 09/27/2020 1846   PROTEINUR NEGATIVE 09/27/2020 1846   UROBILINOGEN 1.0 12/03/2012 1531   NITRITE NEGATIVE 09/27/2020 1846   LEUKOCYTESUR LARGE (A) 09/27/2020 1846   Sepsis Labs Invalid input(s): PROCALCITONIN,  WBC,  LACTICIDVEN Microbiology Recent Results (from the past 240 hour(s))  Resp Panel by RT-PCR (Flu A&B, Covid) Nasopharyngeal Swab     Status: Abnormal   Collection Time: 03/14/21  5:13 PM   Specimen:  Nasopharyngeal Swab; Nasopharyngeal(NP) swabs in vial transport medium  Result Value Ref Range Status   SARS Coronavirus 2 by RT PCR NEGATIVE NEGATIVE Final    Comment: (NOTE) SARS-CoV-2 target nucleic acids are NOT DETECTED.  The SARS-CoV-2 RNA is generally detectable in upper respiratory specimens during the acute phase of infection. The lowest concentration of SARS-CoV-2 viral copies this assay can detect is 138 copies/mL. A negative result does not preclude SARS-Cov-2 infection and should not be used as the sole basis for treatment or other patient management decisions. A negative result may occur with  improper specimen collection/handling, submission of specimen other than nasopharyngeal swab, presence of viral mutation(s) within the areas targeted by this assay, and inadequate number of viral copies(<138 copies/mL). A negative result must be combined with clinical observations, patient history, and epidemiological information. The expected result is Negative.  Fact Sheet for Patients:  EntrepreneurPulse.com.au  Fact Sheet for Healthcare Providers:  IncredibleEmployment.be  This test is no t yet approved or cleared by the [place] FDA and  has been authorized for detection and/or diagnosis of SARS-CoV-2 by FDA under an Emergency Use Authorization (EUA). This EUA will remain  in effect (meaning this test can be used) for the duration of the COVID-19 declaration under Section 564(b)(1) of the Act, 21 U.S.C.section 360bbb-3(b)(1), unless the authorization is terminated  or revoked sooner.       Influenza A by PCR POSITIVE (A) NEGATIVE Final   Influenza B by PCR NEGATIVE NEGATIVE Final    Comment: (NOTE) The Xpert Xpress SARS-CoV-2/FLU/RSV plus assay is intended as an aid in the diagnosis of influenza from Nasopharyngeal swab specimens and should not be used as a sole basis for treatment. Nasal washings and aspirates are unacceptable  for Xpert Xpress SARS-CoV-2/FLU/RSV testing.  Fact Sheet for Patients: EntrepreneurPulse.com.au  Fact Sheet for Healthcare Providers: IncredibleEmployment.be  This test is not yet approved or cleared by the [place] FDA and has been authorized for detection and/or diagnosis of SARS-CoV-2 by FDA under an Emergency Use Authorization (  EUA). This EUA will remain in effect (meaning this test can be used) for the duration of the COVID-19 declaration under Section 564(b)(1) of the Act, 21 U.S.C. section 360bbb-3(b)(1), unless the authorization is terminated or revoked.  Performed at [facility], [place] [address] [place], [place] [address]    Time spent: 30 min  SIGNED:   Marylu Lund, MD  [facility] 03/17/2021, 10:55 AM  If 7PM-7AM, please contact night-coverage

## 2021-03-17 NOTE — TOC Transition Note (Signed)
Transition of Care [facility]) - CM/SW Discharge Note   Patient Details  Name: Chelsea [name] MRN: [id number] Date of Birth: February 26, 1951  Transition of Care [facility] CM/SW Contact:  Ross Ludwig, LCSW Phone Number: 03/17/2021, 5:25 PM   Clinical Narrative:     CSW was informed that patient needs oxygen.  CSW spoke to patient and she stated she has oxygen at home through [facility] that she uses.  CSW asked if patient had a portable tank available to her, and she said no.  CSW contacted [facility] and spoke to [place], he confirmed patient is current with them.  CSW requested a portable tank be delivered to patient's room before she discharges.  CSW updated bedside nurse that portable tank will be delivered today before she leaves.  Patient stated she did not have any other needs or concerns.  CSW signing off, please reconsult if other social work needs arise.   Final next level of care: Home/Self Care Barriers to Discharge: Barriers Resolved   Patient Goals and CMS Choice Patient states their goals for this hospitalization and ongoing recovery are:: To return back home. CMS Medicare.gov Compare Post Acute Care list provided to:: Patient Choice offered to / list presented to : Patient  Discharge Placement                       Discharge Plan and Services                DME Arranged: Oxygen DME Agency: [employer] [facility] Date DME Agency Contacted: 03/17/21 Time DME Agency Contacted: 1500 Representative spoke with at DME Agency: [place]            Social Determinants of Health ([place]) Interventions     Readmission Risk Interventions No flowsheet data found.

## 2021-03-17 NOTE — Progress Notes (Signed)
SATURATION QUALIFICATIONS: (This note is used to comply with regulatory documentation for home oxygen)  Patient Saturations on Room Air at Rest = 90%  Patient Saturations on Room Air while Ambulating = 88%  Patient Saturations on 2 Liters of oxygen while Ambulating = 91%  Please briefly explain why patient needs home oxygen: desat on room air

## 2021-03-19 ENCOUNTER — Encounter (HOSPITAL_COMMUNITY): Payer: Self-pay | Admitting: Emergency Medicine

## 2021-03-19 ENCOUNTER — Emergency Department (HOSPITAL_COMMUNITY): Payer: Medicare Other

## 2021-03-19 ENCOUNTER — Inpatient Hospital Stay (HOSPITAL_COMMUNITY)
Admission: EM | Admit: 2021-03-19 | Discharge: 2021-03-23 | DRG: 190 | Disposition: A | Discharge status: Home / Self Care | Payer: Medicare Other | Attending: Internal Medicine | Admitting: Internal Medicine

## 2021-03-19 ENCOUNTER — Other Ambulatory Visit: Payer: Self-pay

## 2021-03-19 DIAGNOSIS — R0602 Shortness of breath: Secondary | ICD-10-CM | POA: Diagnosis not present

## 2021-03-19 DIAGNOSIS — Z79899 Other long term (current) drug therapy: Secondary | ICD-10-CM

## 2021-03-19 DIAGNOSIS — J9611 Chronic respiratory failure with hypoxia: Secondary | ICD-10-CM | POA: Diagnosis not present

## 2021-03-19 DIAGNOSIS — I130 Hypertensive heart and chronic kidney disease with heart failure and stage 1 through stage 4 chronic kidney disease, or unspecified chronic kidney disease: Secondary | ICD-10-CM | POA: Diagnosis present

## 2021-03-19 DIAGNOSIS — N1831 Chronic kidney disease, stage 3a: Secondary | ICD-10-CM | POA: Diagnosis not present

## 2021-03-19 DIAGNOSIS — Z87891 Personal history of nicotine dependence: Secondary | ICD-10-CM

## 2021-03-19 DIAGNOSIS — G4733 Obstructive sleep apnea (adult) (pediatric): Secondary | ICD-10-CM | POA: Diagnosis not present

## 2021-03-19 DIAGNOSIS — N1832 Chronic kidney disease, stage 3b: Secondary | ICD-10-CM | POA: Diagnosis present

## 2021-03-19 DIAGNOSIS — Z880 Allergy status to penicillin: Secondary | ICD-10-CM

## 2021-03-19 DIAGNOSIS — Z7984 Long term (current) use of oral hypoglycemic drugs: Secondary | ICD-10-CM

## 2021-03-19 DIAGNOSIS — F419 Anxiety disorder, unspecified: Secondary | ICD-10-CM | POA: Diagnosis not present

## 2021-03-19 DIAGNOSIS — Z8619 Personal history of other infectious and parasitic diseases: Secondary | ICD-10-CM | POA: Diagnosis not present

## 2021-03-19 DIAGNOSIS — I5032 Chronic diastolic (congestive) heart failure: Secondary | ICD-10-CM | POA: Diagnosis not present

## 2021-03-19 DIAGNOSIS — J9811 Atelectasis: Secondary | ICD-10-CM | POA: Diagnosis present

## 2021-03-19 DIAGNOSIS — M1990 Unspecified osteoarthritis, unspecified site: Secondary | ICD-10-CM | POA: Diagnosis present

## 2021-03-19 DIAGNOSIS — Z6834 Body mass index (BMI) 34.0-34.9, adult: Secondary | ICD-10-CM

## 2021-03-19 DIAGNOSIS — Z7952 Long term (current) use of systemic steroids: Secondary | ICD-10-CM

## 2021-03-19 DIAGNOSIS — E785 Hyperlipidemia, unspecified: Secondary | ICD-10-CM | POA: Diagnosis not present

## 2021-03-19 DIAGNOSIS — J189 Pneumonia, unspecified organism: Secondary | ICD-10-CM | POA: Diagnosis present

## 2021-03-19 DIAGNOSIS — Z8719 Personal history of other diseases of the digestive system: Secondary | ICD-10-CM | POA: Diagnosis not present

## 2021-03-19 DIAGNOSIS — E1122 Type 2 diabetes mellitus with diabetic chronic kidney disease: Secondary | ICD-10-CM | POA: Diagnosis present

## 2021-03-19 DIAGNOSIS — J441 Chronic obstructive pulmonary disease with (acute) exacerbation: Secondary | ICD-10-CM | POA: Diagnosis not present

## 2021-03-19 DIAGNOSIS — E440 Moderate protein-calorie malnutrition: Secondary | ICD-10-CM | POA: Diagnosis present

## 2021-03-19 DIAGNOSIS — N183 Chronic kidney disease, stage 3 unspecified: Secondary | ICD-10-CM | POA: Diagnosis present

## 2021-03-19 DIAGNOSIS — Z20822 Contact with and (suspected) exposure to covid-19: Secondary | ICD-10-CM | POA: Diagnosis present

## 2021-03-19 DIAGNOSIS — J90 Pleural effusion, not elsewhere classified: Secondary | ICD-10-CM | POA: Diagnosis not present

## 2021-03-19 DIAGNOSIS — E039 Hypothyroidism, unspecified: Secondary | ICD-10-CM | POA: Diagnosis not present

## 2021-03-19 DIAGNOSIS — K219 Gastro-esophageal reflux disease without esophagitis: Secondary | ICD-10-CM | POA: Diagnosis present

## 2021-03-19 DIAGNOSIS — Z7951 Long term (current) use of inhaled steroids: Secondary | ICD-10-CM

## 2021-03-19 DIAGNOSIS — F309 Manic episode, unspecified: Secondary | ICD-10-CM | POA: Diagnosis present

## 2021-03-19 DIAGNOSIS — E119 Type 2 diabetes mellitus without complications: Secondary | ICD-10-CM

## 2021-03-19 DIAGNOSIS — E669 Obesity, unspecified: Secondary | ICD-10-CM | POA: Diagnosis present

## 2021-03-19 DIAGNOSIS — F319 Bipolar disorder, unspecified: Secondary | ICD-10-CM | POA: Diagnosis present

## 2021-03-19 DIAGNOSIS — Z885 Allergy status to narcotic agent status: Secondary | ICD-10-CM

## 2021-03-19 DIAGNOSIS — R062 Wheezing: Secondary | ICD-10-CM | POA: Diagnosis not present

## 2021-03-19 DIAGNOSIS — Y95 Nosocomial condition: Secondary | ICD-10-CM | POA: Diagnosis present

## 2021-03-19 DIAGNOSIS — R001 Bradycardia, unspecified: Secondary | ICD-10-CM | POA: Diagnosis not present

## 2021-03-19 DIAGNOSIS — Z79890 Hormone replacement therapy (postmenopausal): Secondary | ICD-10-CM

## 2021-03-19 DIAGNOSIS — I10 Essential (primary) hypertension: Secondary | ICD-10-CM | POA: Diagnosis not present

## 2021-03-19 DIAGNOSIS — R000 Tachycardia, unspecified: Secondary | ICD-10-CM | POA: Diagnosis not present

## 2021-03-19 DIAGNOSIS — R011 Cardiac murmur, unspecified: Secondary | ICD-10-CM | POA: Diagnosis present

## 2021-03-19 DIAGNOSIS — J9621 Acute and chronic respiratory failure with hypoxia: Secondary | ICD-10-CM | POA: Diagnosis present

## 2021-03-19 DIAGNOSIS — R0902 Hypoxemia: Secondary | ICD-10-CM | POA: Diagnosis not present

## 2021-03-19 DIAGNOSIS — J440 Chronic obstructive pulmonary disease with acute lower respiratory infection: Secondary | ICD-10-CM | POA: Diagnosis not present

## 2021-03-19 DIAGNOSIS — Z9049 Acquired absence of other specified parts of digestive tract: Secondary | ICD-10-CM

## 2021-03-19 DIAGNOSIS — E7800 Pure hypercholesterolemia, unspecified: Secondary | ICD-10-CM | POA: Diagnosis not present

## 2021-03-19 DIAGNOSIS — Z794 Long term (current) use of insulin: Secondary | ICD-10-CM

## 2021-03-19 DIAGNOSIS — Z888 Allergy status to other drugs, medicaments and biological substances status: Secondary | ICD-10-CM

## 2021-03-19 LAB — CULTURE, BLOOD (ROUTINE X 2)
Culture: NO GROWTH
Culture: NO GROWTH
Special Requests: ADEQUATE

## 2021-03-19 LAB — GLUCOSE, CAPILLARY: Glucose-Capillary: 451 mg/dL — ABNORMAL HIGH (ref 70–99)

## 2021-03-19 LAB — BASIC METABOLIC PANEL
Anion gap: 8 (ref 5–15)
BUN: 21 mg/dL (ref 8–23)
CO2: 23 mmol/L (ref 22–32)
Calcium: 8.4 mg/dL — ABNORMAL LOW (ref 8.9–10.3)
Chloride: 104 mmol/L (ref 98–111)
Creatinine, Ser: 1.45 mg/dL — ABNORMAL HIGH (ref 0.44–1.00)
GFR, Estimated: 39 mL/min — ABNORMAL LOW (ref >60)
Glucose, Bld: 238 mg/dL — ABNORMAL HIGH (ref 70–99)
Potassium: 4.1 mmol/L (ref 3.5–5.1)
Sodium: 135 mmol/L (ref 135–145)

## 2021-03-19 LAB — RESP PANEL BY RT-PCR (FLU A&B, COVID) ARPGX2
Influenza A by PCR: NEGATIVE
Influenza B by PCR: NEGATIVE
SARS Coronavirus 2 by RT PCR: NEGATIVE

## 2021-03-19 LAB — GLUCOSE, RANDOM: Glucose, Bld: 502 mg/dL (ref 70–99)

## 2021-03-19 LAB — BRAIN NATRIURETIC PEPTIDE: B Natriuretic Peptide: 59.3 pg/mL (ref 0.0–100.0)

## 2021-03-19 MED ORDER — INSULIN GLARGINE-YFGN 100 UNIT/ML ~~LOC~~ SOLN
30.0000 [IU] | Freq: Every day | SUBCUTANEOUS | Status: DC
  Administered 2021-03-19: 23:00:00 30 [IU] via SUBCUTANEOUS
  Filled 2021-03-19 (×3): qty 0.3

## 2021-03-19 MED ORDER — ONDANSETRON HCL 4 MG/2ML IJ SOLN: 4.0000 mg | Freq: Four times a day (QID) | INTRAMUSCULAR | Status: DC | PRN

## 2021-03-19 MED ORDER — CLONAZEPAM 1 MG PO TABS
1.0000 mg | ORAL_TABLET | Freq: Every day | ORAL | Status: DC
  Administered 2021-03-19 – 2021-03-22 (×4): 1 mg via ORAL
  Filled 2021-03-19 (×4): qty 1

## 2021-03-19 MED ORDER — ALBUTEROL SULFATE (2.5 MG/3ML) 0.083% IN NEBU: 2.5000 mg | INHALATION_SOLUTION | Freq: Four times a day (QID) | RESPIRATORY_TRACT | Status: DC | PRN

## 2021-03-19 MED ORDER — METHYLPREDNISOLONE SODIUM SUCC 125 MG IJ SOLR
125.0000 mg | Freq: Once | INTRAMUSCULAR | Status: AC
  Administered 2021-03-19: 15:00:00 125 mg via INTRAVENOUS
  Filled 2021-03-19: qty 2

## 2021-03-19 MED ORDER — IPRATROPIUM BROMIDE 0.02 % IN SOLN
0.5000 mg | Freq: Once | RESPIRATORY_TRACT | Status: AC
  Administered 2021-03-19: 16:00:00 0.5 mg via RESPIRATORY_TRACT
  Filled 2021-03-19: qty 2.5

## 2021-03-19 MED ORDER — SODIUM CHLORIDE 0.9 % IV SOLN
1.0000 g | Freq: Once | INTRAVENOUS | Status: AC
  Administered 2021-03-19: 15:00:00 1 g via INTRAVENOUS
  Filled 2021-03-19: qty 10

## 2021-03-19 MED ORDER — HYDROCOD POLST-CPM POLST ER 10-8 MG/5ML PO SUER: 5.0000 mL | Freq: Two times a day (BID) | ORAL | Status: DC | PRN

## 2021-03-19 MED ORDER — FLUTICASONE FUROATE-VILANTEROL 200-25 MCG/ACT IN AEPB
1.0000 | INHALATION_SPRAY | Freq: Every day | RESPIRATORY_TRACT | Status: DC
  Administered 2021-03-20 – 2021-03-23 (×4): 1 via RESPIRATORY_TRACT
  Filled 2021-03-19: qty 28

## 2021-03-19 MED ORDER — FLUTICASONE FUROATE-VILANTEROL 200-25 MCG/INH IN AEPB: 1.0000 | INHALATION_SPRAY | RESPIRATORY_TRACT | Status: DC

## 2021-03-19 MED ORDER — SODIUM CHLORIDE 0.9% FLUSH: 3.0000 mL | INTRAVENOUS | Status: DC | PRN

## 2021-03-19 MED ORDER — SODIUM CHLORIDE 0.9 % IV SOLN
2.0000 g | INTRAVENOUS | Status: DC
  Administered 2021-03-20 – 2021-03-23 (×4): 2 g via INTRAVENOUS
  Filled 2021-03-19 (×4): qty 20

## 2021-03-19 MED ORDER — LEVOTHYROXINE SODIUM 125 MCG PO TABS
125.0000 ug | ORAL_TABLET | Freq: Every day | ORAL | Status: DC
Start: 2021-03-20 — End: 2021-03-23
  Administered 2021-03-20 – 2021-03-23 (×4): 125 ug via ORAL
  Filled 2021-03-19 (×4): qty 1

## 2021-03-19 MED ORDER — INSULIN ASPART 100 UNIT/ML IJ SOLN
0.0000 [IU] | Freq: Three times a day (TID) | INTRAMUSCULAR | Status: DC
  Administered 2021-03-20: 09:00:00 2 [IU] via SUBCUTANEOUS
  Administered 2021-03-20: 12:00:00 7 [IU] via SUBCUTANEOUS

## 2021-03-19 MED ORDER — SODIUM CHLORIDE 0.9 % IV SOLN
500.0000 mg | INTRAVENOUS | Status: DC
  Administered 2021-03-20 – 2021-03-21 (×2): 500 mg via INTRAVENOUS
  Filled 2021-03-19 (×3): qty 5

## 2021-03-19 MED ORDER — ALBUTEROL SULFATE (2.5 MG/3ML) 0.083% IN NEBU
5.0000 mg | INHALATION_SOLUTION | Freq: Once | RESPIRATORY_TRACT | Status: AC
  Administered 2021-03-19: 16:00:00 5 mg via RESPIRATORY_TRACT
  Filled 2021-03-19: qty 6

## 2021-03-19 MED ORDER — FERROUS SULFATE 325 (65 FE) MG PO TABS
325.0000 mg | ORAL_TABLET | Freq: Every day | ORAL | Status: DC
  Administered 2021-03-20 – 2021-03-23 (×4): 325 mg via ORAL
  Filled 2021-03-19 (×4): qty 1

## 2021-03-19 MED ORDER — INSULIN ASPART 100 UNIT/ML IJ SOLN
0.0000 [IU] | Freq: Every day | INTRAMUSCULAR | Status: DC
  Administered 2021-03-19: 23:00:00 5 [IU] via SUBCUTANEOUS

## 2021-03-19 MED ORDER — TRAMADOL HCL 50 MG PO TABS
50.0000 mg | ORAL_TABLET | Freq: Three times a day (TID) | ORAL | Status: DC | PRN
  Filled 2021-03-19: qty 1

## 2021-03-19 MED ORDER — PREDNISONE 20 MG PO TABS
40.0000 mg | ORAL_TABLET | Freq: Every day | ORAL | Status: DC
  Administered 2021-03-21 – 2021-03-22 (×2): 40 mg via ORAL
  Filled 2021-03-19 (×3): qty 2

## 2021-03-19 MED ORDER — AZITHROMYCIN 250 MG PO TABS
500.0000 mg | ORAL_TABLET | Freq: Once | ORAL | Status: AC
  Administered 2021-03-19: 15:00:00 500 mg via ORAL
  Filled 2021-03-19: qty 2

## 2021-03-19 MED ORDER — TRAZODONE HCL 100 MG PO TABS
100.0000 mg | ORAL_TABLET | Freq: Every day | ORAL | Status: DC
  Administered 2021-03-19 – 2021-03-22 (×4): 100 mg via ORAL
  Filled 2021-03-19 (×4): qty 1

## 2021-03-19 MED ORDER — ROPINIROLE HCL 0.5 MG PO TABS
0.7500 mg | ORAL_TABLET | Freq: Every day | ORAL | Status: DC
  Administered 2021-03-19 – 2021-03-22 (×4): 0.75 mg via ORAL
  Filled 2021-03-19 (×4): qty 2

## 2021-03-19 MED ORDER — PAROXETINE HCL 20 MG PO TABS
40.0000 mg | ORAL_TABLET | Freq: Every morning | ORAL | Status: DC
  Administered 2021-03-20 – 2021-03-23 (×3): 40 mg via ORAL
  Filled 2021-03-19 (×4): qty 2

## 2021-03-19 MED ORDER — SODIUM CHLORIDE 0.9 % IV SOLN: 250.0000 mL | INTRAVENOUS | Status: DC | PRN

## 2021-03-19 MED ORDER — POLYETHYLENE GLYCOL 3350 17 G PO PACK: 17.0000 g | PACK | Freq: Every day | ORAL | Status: DC | PRN

## 2021-03-19 MED ORDER — VITAMIN D 25 MCG (1000 UNIT) PO TABS
5000.0000 [IU] | ORAL_TABLET | Freq: Every day | ORAL | Status: DC
  Administered 2021-03-20 – 2021-03-23 (×4): 5000 [IU] via ORAL
  Filled 2021-03-19 (×4): qty 5

## 2021-03-19 MED ORDER — ONDANSETRON HCL 4 MG PO TABS: 4.0000 mg | ORAL_TABLET | Freq: Four times a day (QID) | ORAL | Status: DC | PRN

## 2021-03-19 MED ORDER — BUMETANIDE 1 MG PO TABS
1.0000 mg | ORAL_TABLET | Freq: Every day | ORAL | Status: DC
  Administered 2021-03-20 – 2021-03-23 (×4): 1 mg via ORAL
  Filled 2021-03-19 (×4): qty 1

## 2021-03-19 MED ORDER — ROSUVASTATIN CALCIUM 5 MG PO TABS
5.0000 mg | ORAL_TABLET | Freq: Every day | ORAL | Status: DC
  Administered 2021-03-20 – 2021-03-23 (×4): 5 mg via ORAL
  Filled 2021-03-19 (×4): qty 1

## 2021-03-19 MED ORDER — INSULIN ASPART 100 UNIT/ML IJ SOLN
4.0000 [IU] | Freq: Once | INTRAMUSCULAR | Status: AC
  Administered 2021-03-19: 23:00:00 4 [IU] via SUBCUTANEOUS

## 2021-03-19 MED ORDER — PANTOPRAZOLE SODIUM 40 MG PO TBEC
80.0000 mg | DELAYED_RELEASE_TABLET | Freq: Every day | ORAL | Status: DC
  Administered 2021-03-20 – 2021-03-23 (×4): 80 mg via ORAL
  Filled 2021-03-19 (×4): qty 2

## 2021-03-19 MED ORDER — HEPARIN SODIUM (PORCINE) 5000 UNIT/ML IJ SOLN
5000.0000 [IU] | Freq: Three times a day (TID) | INTRAMUSCULAR | Status: DC
  Administered 2021-03-20 – 2021-03-23 (×11): 5000 [IU] via SUBCUTANEOUS
  Filled 2021-03-19 (×11): qty 1

## 2021-03-19 MED ORDER — ALBUTEROL SULFATE (2.5 MG/3ML) 0.083% IN NEBU
2.5000 mg | INHALATION_SOLUTION | Freq: Four times a day (QID) | RESPIRATORY_TRACT | Status: DC | PRN
  Administered 2021-03-19: 21:00:00 2.5 mg via RESPIRATORY_TRACT
  Filled 2021-03-19: qty 3

## 2021-03-19 MED ORDER — ZIPRASIDONE HCL 80 MG PO CAPS
80.0000 mg | ORAL_CAPSULE | Freq: Every day | ORAL | Status: DC
  Administered 2021-03-19 – 2021-03-22 (×4): 80 mg via ORAL
  Filled 2021-03-19 (×5): qty 1

## 2021-03-19 MED ORDER — MONTELUKAST SODIUM 10 MG PO TABS
10.0000 mg | ORAL_TABLET | Freq: Every day | ORAL | Status: DC
  Administered 2021-03-20 – 2021-03-23 (×4): 10 mg via ORAL
  Filled 2021-03-19 (×4): qty 1

## 2021-03-19 MED ORDER — SODIUM CHLORIDE 0.9% FLUSH
3.0000 mL | Freq: Two times a day (BID) | INTRAVENOUS | Status: DC
  Administered 2021-03-19 – 2021-03-23 (×5): 3 mL via INTRAVENOUS

## 2021-03-19 MED ORDER — METHYLPREDNISOLONE SODIUM SUCC 125 MG IJ SOLR
80.0000 mg | INTRAMUSCULAR | Status: AC
  Administered 2021-03-20: 09:00:00 80 mg via INTRAVENOUS
  Filled 2021-03-19: qty 2

## 2021-03-19 NOTE — ED Provider Notes (Signed)
[facility] DEPT Provider Note   CSN: [id number] Arrival date & time: 03/19/21  1228     History Chief Complaint  Patient presents with   Shortness of Breath    Chelsea [name] is a 70 y.o. female.  70 year old female with chronic hypoxic respiratory failure due to COPD on 2 L of oxygen at all time who brought to the emergency department with shortness of breath and hypoxia.  Patient states that she has had progressively worsening difficulty breathing over the past couple of days.  She states she has been using her nebulizer and inhaler more frequently.  This morning she took off her oxygen because she was so sick of tugging at around the house and feeling so short of breath.  Her oxygen saturations were low prior to arrival.  She was placed on 2 L with improvement in her oxygen saturation.  Patient was given a nebulizer treatment prior to arrival.  She feels slightly improved.  The history is provided by the patient.  Shortness of Breath     Past Medical History:  Diagnosis Date   Anxiety    Arthritis    Bipolar disorder ([place])    COPD (chronic obstructive pulmonary disease) (HCC)    Diabetes mellitus type II    Esophageal stricture    moderate distal   GERD (gastroesophageal reflux disease)    Heart murmur    "HAD AN ECHO YEARS AGO- NOTHING TO BE CONCERNED ABOUT"   Hyperlipemia    Hypertension    Hypothyroidism    Mental disorder    Bipolar   Obesity    Oxygen desaturation during sleep    wears 2 liters at bedtime    Schizophrenia ([place])    patient reports that she is unaware of this   Shortness of breath    with a little activy   Sleep apnea    wears 2 liters of oxygen at bedtime instead of CPAP    Patient Active Problem List   Diagnosis Date Noted   AKI (acute kidney injury) ([place]) 03/14/2021   Influenza A 03/14/2021   Paranoid ([place]) 09/28/2020   Anxiety 09/28/2020   HLD (hyperlipidemia) 09/28/2020   GI bleed 09/28/2020   Acute  blood loss anemia 09/27/2020   Pain due to onychomycosis of toenails of both feet 11/05/2018   Diabetes mellitus without complication ([place]) 12/75/1700   Special screening for malignant neoplasms, colon 01/30/2017   Dysphagia 06/10/2016   Stricture and stenosis of esophagus 06/10/2016   Food impaction of esophagus 06/03/2016   Peripheral edema 12/04/2012   Morbid obesity ([place]) 12/04/2012   Type 2 diabetes mellitus without complication ([place]) 17/49/4496   Chest pain 12/03/2012   Dyspnea 12/03/2012   Insomnia 06/03/2012   Abnormal mammogram with microcalcification 04/23/2012   Combined hyperlipidemia associated with type 2 diabetes mellitus ([place]) 06/20/2011   Allergic rhinitis 01/10/2011   Mild concentric left ventricular hypertrophy (LVH) 11/05/2010   Hypothyroidism 06/07/2010   COPD (chronic obstructive pulmonary disease) ([place]) 03/28/2010   Bipolar I disorder, single manic episode ([place]) 02/19/2010   Obstructive sleep apnea 09/21/2009   Essential hypertension 12/27/2008   DIZZINESS 12/27/2008   DYSPNEA 12/27/2008    Past Surgical History:  Procedure Laterality Date   BALLOON DILATION N/A 06/10/2016   Procedure: BALLOON DILATION;  Surgeon: Wilford Corner, MD;  Location: [facility];  Service: Endoscopy;  Laterality: N/A;   BREAST EXCISIONAL BIOPSY Left 05/10/2012   benign   CHOLECYSTECTOMY     COLONOSCOPY WITH  PROPOFOL N/A 01/30/2017   Procedure: COLONOSCOPY WITH PROPOFOL;  Surgeon: Wilford Corner, MD;  Location: [facility];  Service: Endoscopy;  Laterality: N/A;   COLONOSCOPY WITH PROPOFOL N/A 09/29/2020   Procedure: COLONOSCOPY WITH PROPOFOL;  Surgeon: Clarene Essex, MD;  Location: [facility] ENDOSCOPY;  Service: Endoscopy;  Laterality: N/A;   ESOPHAGEAL MANOMETRY N/A 12/19/2020   Procedure: ESOPHAGEAL MANOMETRY (EM);  Surgeon: Wilford Corner, MD;  Location: [facility];  Service: Endoscopy;  Laterality: N/A;   ESOPHAGOGASTRODUODENOSCOPY N/A 06/03/2016   Procedure:  ESOPHAGOGASTRODUODENOSCOPY (EGD);  Surgeon: Wilford Corner, MD;  [facility]COPY;  Service: Endoscopy;  Laterality: N/A;   ESOPHAGOGASTRODUODENOSCOPY N/A 06/10/2016   Procedure: ESOPHAGOGASTRODUODENOSCOPY (EGD);  Surgeon: Wilford Corner, MD;  Location: [facility] ENDOSCOPY;  Service: Endoscopy;  Laterality: N/A;   ESOPHAGOGASTRODUODENOSCOPY (EGD) WITH PROPOFOL N/A 09/28/2020   Procedure: ESOPHAGOGASTRODUODENOSCOPY (EGD) WITH PROPOFOL;  Surgeon: Arta Silence, MD;  Location: [facility] ENDOSCOPY;  Service: Endoscopy;  Laterality: N/A;   EYE SURGERY     PARTIAL MASTECTOMY WITH NEEDLE LOCALIZATION Left 05/10/2012   Procedure: PARTIAL MASTECTOMY WITH NEEDLE LOCALIZATION;  Surgeon: Adin Hector, MD;  Location: [place];  Service: General;  Laterality: Left;   POLYPECTOMY  09/29/2020   Procedure: POLYPECTOMY;  Surgeon: Clarene Essex, MD;  Location: [facility] ENDOSCOPY;  Service: Endoscopy;;   TONSILLECTOMY       OB History   No obstetric history on file.     Family History  Problem Relation Age of Onset   Alzheimer's disease Mother    Alcohol abuse Father    Stroke Father    Restless legs syndrome Neg Hx     Social History   Tobacco Use   Smoking status: Former    Packs/day: 3.00    Years: 29.00    Pack years: 87.00    Types: Cigarettes    Quit date: 05/03/1993    Years since quitting: 27.8   Smokeless tobacco: Never  Vaping Use   Vaping Use: Never used  Substance Use Topics   Alcohol use: No    Alcohol/week: 0.0 standard drinks   Drug use: No    Home Medications Prior to Admission medications   Medication Sig Start Date End Date Taking? Authorizing Provider  albuterol (PROVENTIL) (2.5 MG/3ML) 0.083% nebulizer solution Take 2.5 mg by nebulization every 6 (six) hours as needed for wheezing or shortness of breath. 03/13/21   [provider]  benzonatate (TESSALON) 200 MG capsule Take 1 capsule (200 mg total) by mouth 3 (three) times daily as needed for cough. 03/17/21   Donne Hazel, MD  bumetanide (BUMEX) 1 MG tablet Take 1 tablet (1 mg total) by mouth daily. 12/04/12   Delfina Redwood, MD  chlorpheniramine-HYDROcodone (TUSSIONEX) 10-8 MG/5ML SUER Take 5 mLs by mouth every 12 (twelve) hours as needed. cough 03/14/21   [provider]  Cholecalciferol (VITAMIN D3) 5000 units TABS Take 1 tablet by mouth daily.    [provider]  clonazePAM (KLONOPIN) 1 MG tablet Take 1 tablet (1 mg total) by mouth at bedtime. 03/04/21   Arfeen, Arlyce Harman, MD  diclofenac sodium (VOLTAREN) 1 % GEL Apply 2 g topically 4 (four) times daily as needed (pain).    [provider]  FEROSUL 325 (65 Fe) MG tablet Take 325 mg by mouth daily. 06/21/18   [provider]  fluticasone furoate-vilanterol (BREO ELLIPTA) 200-25 MCG/INH AEPB Inhale 1 puff into the lungs every morning.    [provider]  Insulin Aspart (NOVOLOG IJ) Inject 1-15 Units  as directed See admin instructions. As directed Per sliding scale.    [provider]  INVOKANA 300 MG TABS tablet Take 300 mg by mouth daily before breakfast.  04/03/16   [provider]  LEVEMIR 100 UNIT/ML injection Inject 30 Units into the skin daily. 07/21/18   [provider]  levothyroxine (SYNTHROID) 125 MCG tablet Take 125 mcg by mouth daily before breakfast. 07/21/18   [provider]  metFORMIN (GLUCOPHAGE) 500 MG tablet Take 500 mg by mouth daily. 01/23/20   [provider]  methylPREDNISolone (MEDROL) 4 MG tablet Take 4 mg by mouth as directed. 03/14/21   [provider]  montelukast (SINGULAIR) 10 MG tablet Take 10 mg by mouth daily. 01/23/20   [provider]  nystatin ointment (MYCOSTATIN) Apply 1 application topically 2 (two) times daily.    [provider]  omeprazole (PRILOSEC) 40 MG capsule Take 40 mg by mouth daily. 01/23/20   [provider]  oseltamivir (TAMIFLU) 30 MG capsule Take 1 capsule (30 mg total) by mouth 2 (two) times  daily for 3 days. 03/17/21 03/20/21  Donne Hazel, MD  OZEMPIC, 1 MG/DOSE, 4 MG/3ML SOPN Inject 2 mg into the skin once a week. tuesdays 01/23/20   [provider]  PARoxetine (PAXIL) 40 MG tablet Take 1 tablet (40 mg total) by mouth every morning. 03/04/21   Arfeen, Arlyce Harman, MD  rOPINIRole (REQUIP) 0.25 MG tablet TAKE 3 TABLETS BY MOUTH AT BEDTIME Patient taking differently: 0.75 mg at bedtime. 12/24/20   Ward Givens, NP  rosuvastatin (CRESTOR) 5 MG tablet Take 5 mg by mouth daily. 07/20/18   [provider]  traZODone (DESYREL) 100 MG tablet Take 1 tablet (100 mg total) by mouth at bedtime. 03/04/21   Arfeen, Arlyce Harman, MD  TRUEPLUS INSULIN SYRINGE 29G X 1/2" 1 ML MISC 1 each by Other route daily. 07/16/18   [provider]  VENTOLIN HFA 108 (90 Base) MCG/ACT inhaler Inhale 1-2 puffs into the lungs every 4 (four) hours as needed for wheezing or shortness of breath. 07/21/18   [provider]  ziprasidone (GEODON) 80 MG capsule Take 1 capsule (80 mg total) by mouth at bedtime. 03/04/21   Kathlee Nations, MD    Allergies    Darvocet [propoxyphene n-acetaminophen], Fluoxetine, Penicillins, and Promethazine  Review of Systems   Review of Systems  Respiratory:  Positive for shortness of breath.   Ten systems reviewed and are negative for acute change, except as noted in the HPI.   Physical Exam Updated Vital Signs BP (!) 149/59 (BP Location: Left Arm)    Pulse (!) 106    Temp 99 F (37.2 C) (Oral)    Resp 18    SpO2 99%   Physical Exam Vitals and nursing note reviewed.  Constitutional:      General: She is not in acute distress.    Appearance: She is well-developed. She is not diaphoretic.  HENT:     Head: Normocephalic and atraumatic.     Right Ear: External ear normal.     Left Ear: External ear normal.     Nose: Nose normal.     Mouth/Throat:     Mouth: Mucous membranes are moist.  Eyes:     General: No scleral icterus.    Conjunctiva/sclera:  Conjunctivae normal.  Cardiovascular:     Rate and Rhythm: Normal rate and regular rhythm.     Heart sounds: Normal heart sounds. No murmur heard.  No friction rub. No gallop.  Pulmonary:     Effort: Tachypnea present. No respiratory distress.     Breath sounds: Decreased air movement present. Wheezing present.  Abdominal:     General: Bowel sounds are normal. There is no distension.     Palpations: Abdomen is soft. There is no mass.     Tenderness: There is no abdominal tenderness. There is no guarding.  Musculoskeletal:     Cervical back: Normal range of motion.  Skin:    General: Skin is warm and dry.  Neurological:     Mental Status: She is alert and oriented to person, place, and time.  Psychiatric:        Behavior: Behavior normal.    ED Results / Procedures / Treatments   Labs (all labs ordered are listed, but only abnormal results are displayed) Labs Reviewed - No data to display  EKG None  Radiology No results found.  Procedures Procedures   Medications Ordered in ED Medications - No data to display  ED Course  I have reviewed the triage vital signs and the nursing notes.  Pertinent labs & imaging results that were available during my care of the patient were reviewed by me and considered in my medical decision making (see chart for details).    MDM Rules/Calculators/A&P                         patient here with complaint of shortness of breath The emergent differential diagnosis for shortness of breath includes, but is not limited to, Pulmonary edema, bronchoconstriction, Pneumonia, Pulmonary embolism, Pneumotherax/ Hemothorax, Dysrythmia, ACS.   I ordered and reviewed labs and included BMP which shows mildly elevated blood glucose, creatinine of 1.45 which appears to be the patient's baseline.  Respiratory panel negative for flu.  BNP within normal limits.  Two-view chest x-ray shows pleural effusion left lower lobe versus pneumonia.  Patient being  treated for pneumonia with Rocephin and azithromycin, multiple neb treatments with persistent tachypnea and borderline hypoxia on her standard 2 L.  Patient will be admitted to the hospitalist service for COPD exacerbation versus community-acquired pneumonia   Final Clinical Impression(s) / ED Diagnoses Final diagnoses:  None    Rx / DC Orders ED Discharge Orders     None        Ned Grace 03/19/21 2122    Lacretia Leigh, MD 03/20/21 1737

## 2021-03-19 NOTE — ED Provider Notes (Signed)
I provided a substantive portion of the care of this patient.  I personally performed the entirety of the medical decision making for this encounter.  EKG Interpretation  Date/Time:  Tuesday March 19 2021 13:10:18 EST Ventricular Rate:  110 PR Interval:  138 QRS Duration: 83 QT Interval:  322 QTC Calculation: 436 R Axis:   -27 Text Interpretation: Sinus tachycardia Borderline left axis deviation Consider anterior infarct Baseline wander in lead(s) V2 Confirmed by Lacretia Leigh ([id number]) on 03/19/2021 3:01:3 PM    70 year old female with history of COPD presents with acute shortness of breath.  Patient with likely COPD exacerbation here.  Slight improvement with treatment but will require hospitalization   Lacretia Leigh, MD 03/19/21 1501

## 2021-03-19 NOTE — ED Triage Notes (Signed)
Per [facility] pt coming from home c/o shortness of breath x 3 days. Has COPD. Home health nurse came to meet pt for first time and states pt was wheezing. Nurse administered a neb treatment. Wheezing resolved. On 2L [place] at all times.

## 2021-03-19 NOTE — H&P (Signed)
History and Physical    CIJI BOSTON HER:[id number] DOB: 1950/11/29 DOA: 03/19/2021  PCP: [facility]  Patient coming from: Home   Chief Complaint: SOB   HPI: Chelsea [name] is a 70 y.o. female with medical history significant of COPD, chronic respiratory failure on 2 L oxygen, severe OSA (recently diagnosed and not yet on CPAP), CKD stage IIIb, hyperlipidemia, hypothyroidism, diabetes mellitus, chronic diastolic heart failure, anxiety and bipolar disorder who presents to the hospital with shortness of breath.  She was recently hospitalized for GI bleeding and was sent home a couple of days ago.  She admits to shortness of breath, dry cough.  No fevers, chest pain, nausea, vomiting, diarrhea, or abdominal pain.  No further GI bleeding since discharge.  ED Course: Chest x-ray revealed new small left pleural effusion with adjacent atelectasis or infection.  Patient was given Rocephin, azithromycin, Solu-Medrol.  Review of Systems: As per HPI. Otherwise, all other review of systems reviewed and are negative.   Past Medical History:  Diagnosis Date   Anxiety    Arthritis    Bipolar disorder (HCC)    COPD (chronic obstructive pulmonary disease) (HCC)    Diabetes mellitus type II    Esophageal stricture    moderate distal   GERD (gastroesophageal reflux disease)    Heart murmur    "HAD AN ECHO YEARS AGO- NOTHING TO BE CONCERNED ABOUT"   Hyperlipemia    Hypertension    Hypothyroidism    Mental disorder    Bipolar   Obesity    Oxygen desaturation during sleep    wears 2 liters at bedtime    Schizophrenia ([place])    patient reports that she is unaware of this   Shortness of breath    with a little activy   Sleep apnea    wears 2 liters of oxygen at bedtime instead of CPAP    Past Surgical History:  Procedure Laterality Date   BALLOON DILATION N/A 06/10/2016   Procedure: BALLOON DILATION;  Surgeon: Wilford Corner, MD;  Location: [facility];  Service:  Endoscopy;  Laterality: N/A;   BREAST EXCISIONAL BIOPSY Left 05/10/2012   benign   CHOLECYSTECTOMY     COLONOSCOPY WITH PROPOFOL N/A 01/30/2017   Procedure: COLONOSCOPY WITH PROPOFOL;  Surgeon: Wilford Corner, MD;  Location: [facility];  Service: Endoscopy;  Laterality: N/A;   COLONOSCOPY WITH PROPOFOL N/A 09/29/2020   Procedure: COLONOSCOPY WITH PROPOFOL;  Surgeon: Clarene Essex, MD;  Location: [facility];  Service: Endoscopy;  Laterality: N/A;   ESOPHAGEAL MANOMETRY N/A 12/19/2020   Procedure: ESOPHAGEAL MANOMETRY (EM);  Surgeon: Wilford Corner, MD;  Location: [facility];  Service: Endoscopy;  Laterality: N/A;   ESOPHAGOGASTRODUODENOSCOPY N/A 06/03/2016   Procedure: ESOPHAGOGASTRODUODENOSCOPY (EGD);  Surgeon: Wilford Corner, MD;  Location: [facility] ENDOSCOPY;  Service: Endoscopy;  Laterality: N/A;   ESOPHAGOGASTRODUODENOSCOPY N/A 06/10/2016   Procedure: ESOPHAGOGASTRODUODENOSCOPY (EGD);  Surgeon: Wilford Corner, MD;  Location: [facility]COPY;  Service: Endoscopy;  Laterality: N/A;   ESOPHAGOGASTRODUODENOSCOPY (EGD) WITH PROPOFOL N/A 09/28/2020   Procedure: ESOPHAGOGASTRODUODENOSCOPY (EGD) WITH PROPOFOL;  Surgeon: Arta Silence, MD;  Location: [facility];  Service: Endoscopy;  Laterality: N/A;   EYE SURGERY     PARTIAL MASTECTOMY WITH NEEDLE LOCALIZATION Left 05/10/2012   Procedure: PARTIAL MASTECTOMY WITH NEEDLE LOCALIZATION;  Surgeon: Adin Hector, MD;  Location: [facility];  Service: General;  Laterality: Left;   POLYPECTOMY  09/29/2020   Procedure: POLYPECTOMY;  Surgeon: Clarene Essex, MD;  Location: [facility];  Service: Endoscopy;;  TONSILLECTOMY       reports that she quit smoking about 27 years ago. Her smoking use included cigarettes. She has a 87.00 pack-year smoking history. She has never used smokeless tobacco. She reports that she does not drink alcohol and does not use drugs.  Allergies  Allergen Reactions   Darvocet [Propoxyphene N-Acetaminophen] Anaphylaxis    Can take  plain tylenol   Fluoxetine Other (See Comments)    Hallucinations    Penicillins Rash    REACTION: rash   Promethazine Hives    Family History  Problem Relation Age of Onset   Alzheimer's disease Mother    Alcohol abuse Father    Stroke Father    Restless legs syndrome Neg Hx     Prior to Admission medications   Medication Sig Start Date End Date Taking? Authorizing Provider  albuterol (PROVENTIL) (2.5 MG/3ML) 0.083% nebulizer solution Take 2.5 mg by nebulization every 6 (six) hours as needed for wheezing or shortness of breath. 03/13/21   [provider]  benzonatate (TESSALON) 200 MG capsule Take 1 capsule (200 mg total) by mouth 3 (three) times daily as needed for cough. 03/17/21   Donne Hazel, MD  bumetanide (BUMEX) 1 MG tablet Take 1 tablet (1 mg total) by mouth daily. 12/04/12   Delfina Redwood, MD  chlorpheniramine-HYDROcodone (TUSSIONEX) 10-8 MG/5ML SUER Take 5 mLs by mouth every 12 (twelve) hours as needed. cough 03/14/21   [provider]  Cholecalciferol (VITAMIN D3) 5000 units TABS Take 1 tablet by mouth daily.    [provider]  clonazePAM (KLONOPIN) 1 MG tablet Take 1 tablet (1 mg total) by mouth at bedtime. 03/04/21   Arfeen, Arlyce Harman, MD  diclofenac sodium (VOLTAREN) 1 % GEL Apply 2 g topically 4 (four) times daily as needed (pain).    [provider]  FEROSUL 325 (65 Fe) MG tablet Take 325 mg by mouth daily. 06/21/18   [provider]  fluticasone furoate-vilanterol (BREO ELLIPTA) 200-25 MCG/INH AEPB Inhale 1 puff into the lungs every morning.    [provider]  Insulin Aspart (NOVOLOG IJ) Inject 1-15 Units as directed See admin instructions. As directed Per sliding scale.    [provider]  INVOKANA 300 MG TABS tablet Take 300 mg by mouth daily before breakfast.  04/03/16   [provider]  LEVEMIR 100 UNIT/ML injection Inject 30 Units into the skin daily. 07/21/18   [provider]   levothyroxine (SYNTHROID) 125 MCG tablet Take 125 mcg by mouth daily before breakfast. 07/21/18   [provider]  metFORMIN (GLUCOPHAGE) 500 MG tablet Take 500 mg by mouth daily. 01/23/20   [provider]  methylPREDNISolone (MEDROL) 4 MG tablet Take 4 mg by mouth as directed. 03/14/21   [provider]  montelukast (SINGULAIR) 10 MG tablet Take 10 mg by mouth daily. 01/23/20   [provider]  nystatin ointment (MYCOSTATIN) Apply 1 application topically 2 (two) times daily.    [provider]  omeprazole (PRILOSEC) 40 MG capsule Take 40 mg by mouth daily. 01/23/20   [provider]  oseltamivir (TAMIFLU) 30 MG capsule Take 1 capsule (30 mg total) by mouth 2 (two) times daily for 3 days. 03/17/21 03/20/21  Donne Hazel, MD  OZEMPIC, 1 MG/DOSE, 4 MG/3ML SOPN Inject 2 mg into the skin once a week. tuesdays 01/23/20   [provider]  PARoxetine (PAXIL) 40 MG tablet Take 1 tablet (40 mg total) by mouth every morning. 03/04/21  Arfeen, Arlyce Harman, MD  rOPINIRole (REQUIP) 0.25 MG tablet TAKE 3 TABLETS BY MOUTH AT BEDTIME Patient taking differently: 0.75 mg at bedtime. 12/24/20   Ward Givens, NP  rosuvastatin (CRESTOR) 5 MG tablet Take 5 mg by mouth daily. 07/20/18   [provider]  traZODone (DESYREL) 100 MG tablet Take 1 tablet (100 mg total) by mouth at bedtime. 03/04/21   Arfeen, Arlyce Harman, MD  TRUEPLUS INSULIN SYRINGE 29G X 1/2" 1 ML MISC 1 each by Other route daily. 07/16/18   [provider]  VENTOLIN HFA 108 (90 Base) MCG/ACT inhaler Inhale 1-2 puffs into the lungs every 4 (four) hours as needed for wheezing or shortness of breath. 07/21/18   [provider]  ziprasidone (GEODON) 80 MG capsule Take 1 capsule (80 mg total) by mouth at bedtime. 03/04/21   Kathlee Nations, MD    Physical Exam: Vitals:   03/19/21 1400 03/19/21 1415 03/19/21 1500 03/19/21 1530  BP: (!) 164/61 (!) 153/67 (!) 148/61 (!) 149/60   Pulse: (!) 105 (!) 104 (!) 107 (!) 109  Resp: 16 19 17 16   Temp:      TempSrc:      SpO2: 96% 94% 97% 97%     Constitutional: NAD, calm, comfortable Eyes: PERRL, lids and conjunctivae normal Respiratory: Clear to auscultation anteriorly, diminished overall, no wheezing, no crackles. Normal respiratory effort. No accessory muscle use. No conversational dyspnea  Cardiovascular: Regular rate and rhythm, no murmurs. No extremity edema.  Abdomen: Soft, nondistended, nontender to palpation. Bowel sounds positive.  Musculoskeletal: No joint deformity upper and lower extremities. No contractures. Normal muscle tone.  Skin: no rashes, lesions, ulcers on exposed skin  Neurologic: Alert and oriented, speech fluent, CN 2-12 grossly intact. No focal deficits.   Psychiatric: Normal judgment and insight. Normal mood and affect   Labs on Admission: I have personally reviewed following labs and imaging studies  CBC: Recent Labs  Lab 03/14/21 1700 03/15/21 0515 03/16/21 0608 03/17/21 0541  WBC 9.7 7.4 13.8* 11.2*  NEUTROABS 9.1*  --   --   --   HGB 7.5* 8.2* 8.6* 8.6*  HCT 24.1* 26.6* 27.8* 28.7*  MCV 84.9 83.4 84.2 85.9  PLT 276 260 315 500   Basic Metabolic Panel: Recent Labs  Lab 03/14/21 1700 03/15/21 0515 03/16/21 0608 03/17/21 0541 03/19/21 1315  NA 133* 140 135 140 135  K 3.6 3.9 4.4 4.5 4.1  CL 99 103 103 109 104  CO2 21* 27 26 25 23   GLUCOSE 363* 170* 152* 128* 238*  BUN 28* 23 18 16 21   CREATININE 1.88* 1.31* 1.29* 1.19* 1.45*  CALCIUM 8.6* 8.5* 8.2* 8.4* 8.4*   GFR: Estimated Creatinine Clearance: 37.6 mL/min (A) (by C-G formula based on SCr of 1.45 mg/dL (H)). Liver Function Tests: Recent Labs  Lab 03/16/21 0608 03/17/21 0541  AST 14* 9*  ALT 11 11  ALKPHOS 71 69  BILITOT 0.4 0.4  PROT 5.9* 5.6*  ALBUMIN 2.6* 2.5*   No results for input(s): LIPASE, AMYLASE in the last 168 hours. No results for input(s): AMMONIA in the last 168 hours. Coagulation  Profile: No results for input(s): INR, PROTIME in the last 168 hours. Cardiac Enzymes: No results for input(s): CKTOTAL, CKMB, CKMBINDEX, TROPONINI in the last 168 hours. BNP (last 3 results) No results for input(s): PROBNP in the last 8760 hours. HbA1C: No results for input(s): HGBA1C in the last 72 hours. CBG: Recent Labs  Lab 03/16/21 1136 03/16/21 1622 03/16/21 1942  03/17/21 0746 03/17/21 1139  GLUCAP 133* 175* 230* 116* 287*   Lipid Profile: No results for input(s): CHOL, HDL, LDLCALC, TRIG, CHOLHDL, LDLDIRECT in the last 72 hours. Thyroid Function Tests: No results for input(s): TSH, T4TOTAL, FREET4, T3FREE, THYROIDAB in the last 72 hours. Anemia Panel: No results for input(s): VITAMINB12, FOLATE, FERRITIN, TIBC, IRON, RETICCTPCT in the last 72 hours. Urine analysis:    Component Value Date/Time   COLORURINE YELLOW 09/27/2020 1846   APPEARANCEUR CLEAR 09/27/2020 1846   LABSPEC 1.005 09/27/2020 1846   PHURINE 6.0 09/27/2020 1846   GLUCOSEU NEGATIVE 09/27/2020 1846   HGBUR NEGATIVE 09/27/2020 1846   BILIRUBINUR NEGATIVE 09/27/2020 1846   KETONESUR NEGATIVE 09/27/2020 1846   PROTEINUR NEGATIVE 09/27/2020 1846   UROBILINOGEN 1.0 12/03/2012 1531   NITRITE NEGATIVE 09/27/2020 1846   LEUKOCYTESUR LARGE (A) 09/27/2020 1846   Sepsis Labs: !!!!!!!!!!!!!!!!!!!!!!!!!!!!!!!!!!!!!!!!!!!! @LABRCNTIP (procalcitonin:4,lacticidven:4) ) Recent Results (from the past 240 hour(s))  Culture, blood (routine x 2)     Status: None   Collection Time: 03/14/21  5:00 PM   Specimen: BLOOD  Result Value Ref Range Status   Specimen Description   Final    BLOOD RIGHT ANTECUBITAL Performed at [facility], [place] [address] [place], [place] [address]    Special Requests   Final    BOTTLES DRAWN AEROBIC AND ANAEROBIC Blood Culture results may not be optimal due to an excessive volume of blood received in culture bottles Performed at [facility]  [address], [place], [place] [address]    Culture   Final    NO GROWTH 5 DAYS Performed at [facility], [place] [address], [place], [place] [address]    Report Status 03/19/2021 FINAL  Final  Culture, blood (routine x 2)     Status: None   Collection Time: 03/14/21  5:13 PM   Specimen: BLOOD  Result Value Ref Range Status   Specimen Description   Final    BLOOD BLOOD LEFT FOREARM Performed at [facility] [address], [place], [place] [address]    Special Requests   Final    BOTTLES DRAWN AEROBIC AND ANAEROBIC Blood Culture adequate volume Performed at [facility] [address] [place], [place] [address]    Culture   Final    NO GROWTH 5 DAYS Performed at [facility], [place] [address], [place], [place] [address]    Report Status 03/19/2021 FINAL  Final  Resp Panel by RT-PCR (Flu A&B, Covid) Nasopharyngeal Swab     Status: Abnormal   Collection Time: 03/14/21  5:13 PM   Specimen: Nasopharyngeal Swab; Nasopharyngeal(NP) swabs in vial transport medium  Result Value Ref Range Status   SARS Coronavirus 2 by RT PCR NEGATIVE NEGATIVE Final    Comment: (NOTE) SARS-CoV-2 target nucleic acids are NOT DETECTED.  The SARS-CoV-2 RNA is generally detectable in upper respiratory specimens during the acute phase of infection. The lowest concentration of SARS-CoV-2 viral copies this assay can detect is 138 copies/mL. A negative result does not preclude SARS-Cov-2 infection and should not be used as the sole basis for treatment or other patient management decisions. A negative result may occur with  improper specimen collection/handling, submission of specimen other than nasopharyngeal swab, presence of viral mutation(s) within the areas targeted by this assay, and inadequate number of viral copies(<138 copies/mL). A negative result must be combined with clinical observations, patient history, and epidemiological information. The expected  result is Negative.  Fact Sheet for Patients:  EntrepreneurPulse.com.au  Fact Sheet for Healthcare Providers:  IncredibleEmployment.be  This test is no t yet approved or cleared by the [place] FDA and  has been authorized for detection and/or diagnosis of SARS-CoV-2 by FDA under an Emergency Use Authorization (EUA). This EUA will remain  in effect (meaning this test can be used) for the duration of the COVID-19 declaration under Section 564(b)(1) of the Act, 21 U.S.C.section 360bbb-3(b)(1), unless the authorization is terminated  or revoked sooner.       Influenza A by PCR POSITIVE (A) NEGATIVE Final   Influenza B by PCR NEGATIVE NEGATIVE Final    Comment: (NOTE) The Xpert Xpress SARS-CoV-2/FLU/RSV plus assay is intended as an aid in the diagnosis of influenza from Nasopharyngeal swab specimens and should not be used as a sole basis for treatment. Nasal washings and aspirates are unacceptable for Xpert Xpress SARS-CoV-2/FLU/RSV testing.  Fact Sheet for Patients: EntrepreneurPulse.com.au  Fact Sheet for Healthcare Providers: IncredibleEmployment.be  This test is not yet approved or cleared by the [place] FDA and has been authorized for detection and/or diagnosis of SARS-CoV-2 by FDA under an Emergency Use Authorization (EUA). This EUA will remain in effect (meaning this test can be used) for the duration of the COVID-19 declaration under Section 564(b)(1) of the Act, 21 U.S.C. section 360bbb-3(b)(1), unless the authorization is terminated or revoked.  Performed at [facility], [place] [address], [place], [place] [address]   Resp Panel by RT-PCR (Flu A&B, Covid) Nasopharyngeal Swab     Status: None   Collection Time: 03/19/21  1:15 PM   Specimen: Nasopharyngeal Swab; Nasopharyngeal(NP) swabs in vial transport medium  Result Value Ref Range Status   SARS Coronavirus 2 by RT  PCR NEGATIVE NEGATIVE Final    Comment: (NOTE) SARS-CoV-2 target nucleic acids are NOT DETECTED.  The SARS-CoV-2 RNA is generally detectable in upper respiratory specimens during the acute phase of infection. The lowest concentration of SARS-CoV-2 viral copies this assay can detect is 138 copies/mL. A negative result does not preclude SARS-Cov-2 infection and should not be used as the sole basis for treatment or other patient management decisions. A negative result may occur with  improper specimen collection/handling, submission of specimen other than nasopharyngeal swab, presence of viral mutation(s) within the areas targeted by this assay, and inadequate number of viral copies(<138 copies/mL). A negative result must be combined with clinical observations, patient history, and epidemiological information. The expected result is Negative.  Fact Sheet for Patients:  EntrepreneurPulse.com.au  Fact Sheet for Healthcare Providers:  IncredibleEmployment.be  This test is no t yet approved or cleared by the [place] FDA and  has been authorized for detection and/or diagnosis of SARS-CoV-2 by FDA under an Emergency Use Authorization (EUA). This EUA will remain  in effect (meaning this test can be used) for the duration of the COVID-19 declaration under Section 564(b)(1) of the Act, 21 U.S.C.section 360bbb-3(b)(1), unless the authorization is terminated  or revoked sooner.       Influenza A by PCR NEGATIVE NEGATIVE Final   Influenza B by PCR NEGATIVE NEGATIVE Final    Comment: (NOTE) The Xpert Xpress SARS-CoV-2/FLU/RSV plus assay is intended as an aid in the diagnosis of influenza from Nasopharyngeal swab specimens and should not be used as a sole basis for treatment. Nasal washings and aspirates are unacceptable for Xpert Xpress SARS-CoV-2/FLU/RSV testing.  Fact Sheet for Patients: EntrepreneurPulse.com.au  Fact Sheet for  Healthcare Providers: IncredibleEmployment.be  This test is  not yet approved or cleared by the [place] and has been authorized for detection and/or diagnosis of SARS-CoV-2 by FDA under an Emergency Use Authorization (EUA). This EUA will remain in effect (meaning this test can be used) for the duration of the COVID-19 declaration under Section 564(b)(1) of the Act, 21 U.S.C. section 360bbb-3(b)(1), unless the authorization is terminated or revoked.  Performed at [facility], [place] [address] [place], [place] [address]      Radiological Exams on Admission: DG Chest 2 View  Result Date: 03/19/2021 CLINICAL DATA:  Shortness of breath EXAM: CHEST - 2 VIEW COMPARISON:  03/14/2021 FINDINGS: Lateral view degraded by patient arm position. Midline trachea. Normal heart size. Atherosclerosis in the transverse aorta. Tiny left pleural effusion, new. No pneumothorax. The Chin overlies the apices minimally. New retrocardiac left lower lobe airspace disease. IMPRESSION: New small left pleural effusion with adjacent atelectasis or infection. Aortic Atherosclerosis (ICD10-I70.0). Electronically Signed   By: Abigail Miyamoto M.D.   On: 03/19/2021 13:39    EKG: Independently reviewed. Sinus tachy HR 110   Assessment/Plan Principal Problem:   COPD exacerbation (HCC) Active Problems:   Type 2 diabetes mellitus without complication (HCC)   Bipolar I disorder, single manic episode (HCC)   Hypothyroidism   OSA (obstructive sleep apnea)   Anxiety   HLD (hyperlipidemia)   Chronic respiratory failure with hypoxia (HCC)   CKD (chronic kidney disease) stage 3, GFR 30-59 ml/min (HCC)   Chronic diastolic CHF (congestive heart failure) (HCC)   COPD exacerbation Left lower lobe HCAP -Breathing tx, Breo, solumedrol -Check MRSA PCR, if positive can consider vancomycin -Continue rocephin, azithromycin -Multimorbid order set for pneumonia and COPD utilized    Chronic hypoxemia respiratory failure -On 2L [place] O2 at baseline  Recent influenza A  -Supportive care   Recent admission for symptomatic anemia with GI bleed -Outpatient GI follow up in 4-6 weeks   CKD stage 3b -Baseline Cr 1.2-1.3 -Stable   HLD -Continue crestor   Hypothyroidism -Continue synthroid   DM type 2 -Semglee 30 u and SSI   Chronic diastolic HF -Continue bumex   Anxiety, bipolar -Continue paxil, trazodone, geodon, klonopin   OSA -Patient was recently diagnosed with severe OSA, she has not gotten her CPAP mask yet   DVT prophylaxis: Subq hep   Code Status: Full  Family Communication: None at bedside  Disposition Plan: Home  Consults called: None     Severity of Illness: The appropriate patient status for this patient is INPATIENT. Inpatient status is judged to be reasonable and necessary in order to provide the required intensity of service to ensure the patient's safety. The patient's presenting symptoms, physical exam findings, and initial radiographic and laboratory data in the context of their chronic comorbidities is felt to place them at high risk for further clinical deterioration. Furthermore, it is not anticipated that the patient will be medically stable for discharge from the hospital within 2 midnights of admission.   * I certify that at the point of admission it is my clinical judgment that the patient will require inpatient hospital care spanning beyond 2 midnights from the point of admission due to high intensity of service, high risk for further deterioration and high frequency of surveillance required.Dessa Phi, DO [facility] 03/19/2021, 4:01 PM   Available via Epic secure chat 7am-7pm After these hours, please refer to coverage provider listed on [email or web address]

## 2021-03-20 ENCOUNTER — Telehealth (HOSPITAL_COMMUNITY): Payer: Medicare Other | Admitting: Psychiatry

## 2021-03-20 DIAGNOSIS — F419 Anxiety disorder, unspecified: Secondary | ICD-10-CM

## 2021-03-20 DIAGNOSIS — N1831 Chronic kidney disease, stage 3a: Secondary | ICD-10-CM

## 2021-03-20 DIAGNOSIS — G4733 Obstructive sleep apnea (adult) (pediatric): Secondary | ICD-10-CM

## 2021-03-20 DIAGNOSIS — J9621 Acute and chronic respiratory failure with hypoxia: Secondary | ICD-10-CM

## 2021-03-20 DIAGNOSIS — I5032 Chronic diastolic (congestive) heart failure: Secondary | ICD-10-CM

## 2021-03-20 DIAGNOSIS — F309 Manic episode, unspecified: Secondary | ICD-10-CM

## 2021-03-20 DIAGNOSIS — E7800 Pure hypercholesterolemia, unspecified: Secondary | ICD-10-CM

## 2021-03-20 DIAGNOSIS — E119 Type 2 diabetes mellitus without complications: Secondary | ICD-10-CM

## 2021-03-20 LAB — BASIC METABOLIC PANEL
Anion gap: 8 (ref 5–15)
BUN: 25 mg/dL — ABNORMAL HIGH (ref 8–23)
CO2: 25 mmol/L (ref 22–32)
Calcium: 8.6 mg/dL — ABNORMAL LOW (ref 8.9–10.3)
Chloride: 103 mmol/L (ref 98–111)
Creatinine, Ser: 1.35 mg/dL — ABNORMAL HIGH (ref 0.44–1.00)
GFR, Estimated: 43 mL/min — ABNORMAL LOW (ref >60)
Glucose, Bld: 312 mg/dL — ABNORMAL HIGH (ref 70–99)
Potassium: 4.1 mmol/L (ref 3.5–5.1)
Sodium: 136 mmol/L (ref 135–145)

## 2021-03-20 LAB — CBC
HCT: 25.7 % — ABNORMAL LOW (ref 36.0–46.0)
Hemoglobin: 8.1 g/dL — ABNORMAL LOW (ref 12.0–15.0)
MCH: 25.6 pg — ABNORMAL LOW (ref 26.0–34.0)
MCHC: 31.5 g/dL (ref 30.0–36.0)
MCV: 81.1 fL (ref 80.0–100.0)
Platelets: 370 10*3/uL (ref 150–400)
RBC: 3.17 MIL/uL — ABNORMAL LOW (ref 3.87–5.11)
RDW: 13.1 % (ref 11.5–15.5)
WBC: 7.6 10*3/uL (ref 4.0–10.5)
nRBC: 0 % (ref 0.0–0.2)

## 2021-03-20 LAB — GLUCOSE, CAPILLARY
Glucose-Capillary: 357 mg/dL — ABNORMAL HIGH (ref 70–99)
Glucose-Capillary: 191 mg/dL — ABNORMAL HIGH (ref 70–99)
Glucose-Capillary: 315 mg/dL — ABNORMAL HIGH (ref 70–99)
Glucose-Capillary: 404 mg/dL — ABNORMAL HIGH (ref 70–99)
Glucose-Capillary: 424 mg/dL — ABNORMAL HIGH (ref 70–99)

## 2021-03-20 LAB — LIPID PANEL
Cholesterol: 109 mg/dL (ref 0–200)
HDL: 32 mg/dL — ABNORMAL LOW (ref >40)
LDL Cholesterol: 65 mg/dL (ref 0–99)
Total CHOL/HDL Ratio: 3.4 RATIO
Triglycerides: 61 mg/dL (ref <150)
VLDL: 12 mg/dL (ref 0–40)

## 2021-03-20 LAB — HEMOGLOBIN A1C
Hgb A1c MFr Bld: 6.5 % — ABNORMAL HIGH (ref 4.8–5.6)
Mean Plasma Glucose: 139.85 mg/dL

## 2021-03-20 LAB — MRSA NEXT GEN BY PCR, NASAL: MRSA by PCR Next Gen: NOT DETECTED

## 2021-03-20 MED ORDER — INSULIN ASPART 100 UNIT/ML IJ SOLN
8.0000 [IU] | Freq: Three times a day (TID) | INTRAMUSCULAR | Status: DC
  Administered 2021-03-21 (×2): 8 [IU] via SUBCUTANEOUS

## 2021-03-20 MED ORDER — INSULIN ASPART 100 UNIT/ML IJ SOLN
0.0000 [IU] | INTRAMUSCULAR | Status: DC
  Administered 2021-03-20 (×2): 15 [IU] via SUBCUTANEOUS
  Administered 2021-03-21: 08:00:00 3 [IU] via SUBCUTANEOUS
  Administered 2021-03-21: 12:00:00 11 [IU] via SUBCUTANEOUS
  Administered 2021-03-21: 3 [IU] via SUBCUTANEOUS

## 2021-03-20 MED ORDER — INSULIN ASPART 100 UNIT/ML IJ SOLN
7.0000 [IU] | Freq: Once | INTRAMUSCULAR | Status: AC
  Administered 2021-03-20: 04:00:00 7 [IU] via SUBCUTANEOUS

## 2021-03-20 MED ORDER — INSULIN ASPART 100 UNIT/ML IJ SOLN
4.0000 [IU] | Freq: Once | INTRAMUSCULAR | Status: AC
  Administered 2021-03-20: 20:00:00 4 [IU] via SUBCUTANEOUS

## 2021-03-20 MED ORDER — IPRATROPIUM-ALBUTEROL 0.5-2.5 (3) MG/3ML IN SOLN
3.0000 mL | Freq: Four times a day (QID) | RESPIRATORY_TRACT | Status: DC
Start: 2021-03-20 — End: 2021-03-21
  Administered 2021-03-20 – 2021-03-21 (×4): 3 mL via RESPIRATORY_TRACT
  Filled 2021-03-20 (×5): qty 3

## 2021-03-20 MED ORDER — INSULIN GLARGINE-YFGN 100 UNIT/ML ~~LOC~~ SOLN
40.0000 [IU] | Freq: Every day | SUBCUTANEOUS | Status: DC
  Administered 2021-03-20 – 2021-03-22 (×3): 40 [IU] via SUBCUTANEOUS
  Filled 2021-03-20 (×4): qty 0.4

## 2021-03-20 MED ORDER — ADULT MULTIVITAMIN W/MINERALS CH
1.0000 | ORAL_TABLET | Freq: Every day | ORAL | Status: DC
  Administered 2021-03-20 – 2021-03-23 (×4): 1 via ORAL
  Filled 2021-03-20 (×4): qty 1

## 2021-03-20 MED ORDER — ENSURE MAX PROTEIN PO LIQD
11.0000 [oz_av] | Freq: Two times a day (BID) | ORAL | Status: DC
Start: 2021-03-20 — End: 2021-03-23
  Administered 2021-03-20 – 2021-03-23 (×6): 11 [oz_av] via ORAL

## 2021-03-20 NOTE — Progress Notes (Signed)
Patient's CBG was 404 at 1654 and doctor was notified. New orders were put into the [facility].

## 2021-03-20 NOTE — Progress Notes (Signed)
Mobility Specialist - Progress Note    03/20/21 1221  Oxygen Therapy  SpO2 94 %  O2 Device Nasal Cannula  O2 Flow Rate (L/min) 3 L/min  Mobility  Activity Ambulated in hall  Level of Assistance Contact guard assist, steadying assist  Assistive Device Other (Comment) (Hand held assist)  Distance Ambulated (ft) 40 ft  Mobility Ambulated with assistance in hallway  Mobility Response Tolerated well  Mobility performed by Mobility specialist  $Mobility charge 1 Mobility   Upon entry pt was very pleasant and agreeable to mobilize. Pt stayed connected to 3L of O2 during session with stats recorded below. Pt ambulated ~40 ft in hallway with hand held assist. 1 seated rest break needed to help pt practice pursed breathing. Pt stated feeling "winded", but was grateful for getting up. Rested for 2 minutes prior to ambulating back to room. Pt left in room, sitting up in recliner with call bell at side and reconnected to 3L of O2.   Pre-mobility: 74 HR, 94% SpO2 During mobility: 101 HR, 89-90% SpO2 Post-mobility: 89 HR, 91%  SPO2  Ralene Muskrat Mobility Specialist Acute Rehabilitation Services Phone: [phone] 03/20/21, 12:25 PM

## 2021-03-20 NOTE — Progress Notes (Signed)
Pt demonstrated good technique and understanding of flutter valve.

## 2021-03-20 NOTE — Progress Notes (Signed)
Patient CBG reading at 2245: 451. Patient asymptomatic. On-Call NP Olena Heckle) made aware acknowledged and order given for 4 additional units of Novolog with SSC for total of 9 units with 0300 recheck. Patient CBG rechecked at 0300 with reading of 357 and order given for 7 units of Novolog.

## 2021-03-20 NOTE — Progress Notes (Signed)
Pt refused CPAP qhs.  Pt states that she does not wear one at home and does not want to wear one here.

## 2021-03-20 NOTE — Progress Notes (Signed)
Inpatient Diabetes Program Recommendations  AACE/ADA: New Consensus Statement on Inpatient Glycemic Control (2015)  Target Ranges:  Prepandial:   less than 140 mg/dL      Peak postprandial:   less than 180 mg/dL (1-2 hours)      Critically ill patients:  140 - 180 mg/dL   Lab Results  Component Value Date   GLUCAP 191 (H) 03/20/2021   HGBA1C 6.8 (H) 12/03/2012    Review of Glycemic Control  Diabetes history: DM2 Outpatient Diabetes medications: Levemir 30 units QHS, Novolog 2-12 units TID, metformin 500 BID, Ozempic 1 mg weekly on Tuesdays Current orders for Inpatient glycemic control: Semglee 30 QHS, Novolog 0-9 units TID with meals and 0-5 HS Prednisone 40 mg - starts 12/22 Received Solumedrol 80 mg this am  HgbA1C - 6.8% - indicates good glycemic control May need meal coverage while on steroids.  Inpatient Diabetes Program Recommendations:    Add Novolog 4 units TID with meals (titrate if CBGs > 180 mg/dL)  Will closely follow glucose trends.  Thank you. Lorenda Peck, RD, LDN, CDE Inpatient Diabetes Coordinator [phone]

## 2021-03-20 NOTE — Progress Notes (Signed)
PROGRESS NOTE    Chelsea [name]  TFT:[id number] DOB: [birth year] DOA: 03/19/2021 PCP: [facility]   Brief Narrative:  70 y.o. WF PMHx Anxiety and Bipolar disorder, COPD, chronic respiratory failure on 2 L oxygen, severe OSA (recently diagnosed and not yet on CPAP), CKD stage IIIb, hyperlipidemia, hypothyroidism, diabetes mellitus, chronic diastolic CHF,   Presents to the hospital with shortness of breath.  She was recently hospitalized for GI bleeding and was sent home a couple of days ago.  She admits to shortness of breath, dry cough.  No fevers, chest pain, nausea, vomiting, diarrhea, or abdominal pain.  No further GI bleeding since discharge.   ED Course: Chest x-ray revealed new small left pleural effusion with adjacent atelectasis or infection.  Patient was given Rocephin, azithromycin, Solu-Medrol.   Subjective: Afebrile overnight,   Assessment & Plan:  Covid vaccination;  Principal Problem:   COPD exacerbation ([place]) Active Problems:   Type 2 diabetes mellitus without complication (HCC)   Bipolar I disorder, single manic episode (HCC)   Hypothyroidism   OSA (obstructive sleep apnea)   Anxiety   HLD (hyperlipidemia)   Chronic respiratory failure with hypoxia (HCC)   CKD (chronic kidney disease) stage 3, GFR 30-59 ml/min (HCC)   Chronic diastolic CHF (congestive heart failure) (HCC)  COPD exacerbation/Left lower lobe HCAP -Breathing tx, Breo, solumedrol -Check MRSA PCR, if positive can consider vancomycin -Continue rocephin, azithromycin -Multimorbid order set for pneumonia and COPD utilized  -DuoNeb  QID -Flutter valve - Incentive spirometry - Prednisone 40 mg daily   Recent influenza A  -Supportive care    Acute on Chronic respiratory failure with hypoxia -On 2L [place] O2 at baseline -12/21 currently on 3 L O2 -Titrate O2 to maintain SPO2> 92%  Lung nodule? - 12/21 Per patient and sister 1 physician told them that patient had lung nodule which  needed further evaluation, another physician informed them not to worry about a lung nodule. - 12/21 we will review CXR, if patient has lung nodule we will obtain chest CT.  Chronic diastolic HF -Continue bumex   OSA -Patient was recently diagnosed with severe OSA, she has not gotten her CPAP mask yet   Recent admission for symptomatic anemia with GI bleed -Outpatient GI follow up in 4-6 weeks  Lab Results  Component Value Date   HGB 8.1 (L) 03/20/2021   HGB 8.6 (L) 03/17/2021   HGB 8.6 (L) 03/16/2021   HGB 8.2 (L) 03/15/2021   HGB 7.5 (L) 03/14/2021  -Stable - Transfuse for hemoglobin<7    CKD stage 3b(-Baseline Cr 1.2-1.3) Lab Results  Component Value Date   CREATININE 1.35 (H) 03/20/2021   CREATININE 1.45 (H) 03/19/2021   CREATININE 1.19 (H) 03/17/2021   CREATININE 1.29 (H) 03/16/2021   CREATININE 1.31 (H) 03/15/2021  -12/21 close to baseline we will continue to monitor.   HLD -Continue crestor    Hypothyroidism -Synthroid 125 mcg daily   DM type 2 controlled with hyperglycemia -12/21 hemoglobin A1c= 6.5 -12/21 increase Semglee 40 units daily -12/21 NovoLog 8 units QAC -12/21 moderate SSI   Anxiety and Bipolar disorder -Continue paxil, trazodone, geodon, klonopin    Obese (BMI 34.07 kg/m)    DVT prophylaxis: Subcu heparin Code Status: Full Family Communication:  Status is: Inpatient    Dispo: The patient is from: Home              Anticipated d/c is to: Home  Anticipated d/c date is: 3 days              Patient currently is not medically stable to d/c.      Consultants:    Procedures/Significant Events:     I have personally reviewed and interpreted all radiology studies and my findings are as above.  VENTILATOR SETTINGS: Nasal cannula 3 L O2, 12/21 SPO2 95%    Cultures   Antimicrobials: Anti-infectives (From admission, onward)    Start     Ordered Stop   03/20/21 1400  cefTRIAXone (ROCEPHIN) 2 g in sodium  chloride 0.9 % 100 mL IVPB        03/19/21 2217 03/25/21 1359   03/20/21 1400  azithromycin (ZITHROMAX) 500 mg in sodium chloride 0.9 % 250 mL IVPB        03/19/21 2217 03/25/21 1359   03/19/21 1430  cefTRIAXone (ROCEPHIN) 1 g in sodium chloride 0.9 % 100 mL IVPB        03/19/21 1415 03/19/21 1515   03/19/21 1430  azithromycin (ZITHROMAX) tablet 500 mg        03/19/21 1415 03/19/21 1446           Devices    LINES / TUBES:      Continuous Infusions:  sodium chloride     azithromycin     cefTRIAXone (ROCEPHIN)  IV       Objective: Vitals:   03/19/21 1900 03/19/21 2014 03/20/21 0130 03/20/21 0600  BP: (!) 105/42 (!) 150/56 (!) 145/69 (!) 137/55  Pulse: 98 86 76 60  Resp: 20 15 16 15   Temp:  99.1 F (37.3 C)  97.9 F (36.6 C)  TempSrc:  Oral  Oral  SpO2: 98% 97% 94% 95%  Weight:  84.5 kg    Height:  5\' 2"  (1.575 m)      Intake/Output Summary (Last 24 hours) at 03/20/2021 0815 Last data filed at 03/20/2021 0600 Gross per 24 hour  Intake 360 ml  Output 1100 ml  Net -740 ml   Filed Weights   03/19/21 2014  Weight: 84.5 kg    Examination:  General: A/O x4, positive acute on chronic respiratory distress Eyes: negative scleral hemorrhage, negative anisocoria, negative icterus ENT: Negative Runny nose, negative gingival bleeding, Neck:  Negative scars, masses, torticollis, lymphadenopathy, JVD Lungs: decreased breath sounds bilaterally, positive expiratory wheezes Cardiovascular: Regular rate and rhythm without murmur gallop or rub normal S1 and S2 Abdomen: negative abdominal pain, nondistended, positive soft, bowel sounds, no rebound, no ascites, no appreciable mass Extremities: No significant cyanosis, clubbing, or edema bilateral lower extremities Skin: Negative rashes, lesions, ulcers Psychiatric:  Negative depression, negative anxiety, negative fatigue, negative mania  Central nervous system:  Cranial nerves II through XII intact, tongue/uvula midline,  all extremities muscle strength 5/5, sensation intact throughout, negative dysarthria, negative expressive aphasia, negative receptive aphasia.  .     Data Reviewed: Care during the described time interval was provided by me .  I have reviewed this patient's available data, including medical history, events of note, physical examination, and all test results as part of my evaluation.   CBC: Recent Labs  Lab 03/14/21 1700 03/15/21 0515 03/16/21 0608 03/17/21 0541 03/20/21 0444  WBC 9.7 7.4 13.8* 11.2* 7.6  NEUTROABS 9.1*  --   --   --   --   HGB 7.5* 8.2* 8.6* 8.6* 8.1*  HCT 24.1* 26.6* 27.8* 28.7* 25.7*  MCV 84.9 83.4 84.2 85.9 81.1  PLT 276 260 315 292  315   Basic Metabolic Panel: Recent Labs  Lab 03/15/21 0515 03/16/21 0608 03/17/21 0541 03/19/21 1315 03/19/21 2312 03/20/21 0444  NA 140 135 140 135  --  136  K 3.9 4.4 4.5 4.1  --  4.1  CL 103 103 109 104  --  103  CO2 27 26 25 23   --  25  GLUCOSE 170* 152* 128* 238* 502* 312*  BUN 23 18 16 21   --  25*  CREATININE 1.31* 1.29* 1.19* 1.45*  --  1.35*  CALCIUM 8.5* 8.2* 8.4* 8.4*  --  8.6*   GFR: Estimated Creatinine Clearance: 40.2 mL/min (A) (by C-G formula based on SCr of 1.35 mg/dL (H)). Liver Function Tests: Recent Labs  Lab 03/16/21 0608 03/17/21 0541  AST 14* 9*  ALT 11 11  ALKPHOS 71 69  BILITOT 0.4 0.4  PROT 5.9* 5.6*  ALBUMIN 2.6* 2.5*   No results for input(s): LIPASE, AMYLASE in the last 168 hours. No results for input(s): AMMONIA in the last 168 hours. Coagulation Profile: No results for input(s): INR, PROTIME in the last 168 hours. Cardiac Enzymes: No results for input(s): CKTOTAL, CKMB, CKMBINDEX, TROPONINI in the last 168 hours. BNP (last 3 results) No results for input(s): PROBNP in the last 8760 hours. HbA1C: No results for input(s): HGBA1C in the last 72 hours. CBG: Recent Labs  Lab 03/17/21 0746 03/17/21 1139 03/19/21 2223 03/20/21 0305 03/20/21 0736  GLUCAP 116* 287* 451*  357* 191*   Lipid Profile: No results for input(s): CHOL, HDL, LDLCALC, TRIG, CHOLHDL, LDLDIRECT in the last 72 hours. Thyroid Function Tests: No results for input(s): TSH, T4TOTAL, FREET4, T3FREE, THYROIDAB in the last 72 hours. Anemia Panel: No results for input(s): VITAMINB12, FOLATE, FERRITIN, TIBC, IRON, RETICCTPCT in the last 72 hours. Urine analysis:    Component Value Date/Time   COLORURINE YELLOW 09/27/2020 1846   APPEARANCEUR CLEAR 09/27/2020 1846   LABSPEC 1.005 09/27/2020 1846   PHURINE 6.0 09/27/2020 1846   GLUCOSEU NEGATIVE 09/27/2020 1846   HGBUR NEGATIVE 09/27/2020 1846   BILIRUBINUR NEGATIVE 09/27/2020 1846   KETONESUR NEGATIVE 09/27/2020 1846   PROTEINUR NEGATIVE 09/27/2020 1846   UROBILINOGEN 1.0 12/03/2012 1531   NITRITE NEGATIVE 09/27/2020 1846   LEUKOCYTESUR LARGE (A) 09/27/2020 1846   Sepsis Labs: @LABRCNTIP (procalcitonin:4,lacticidven:4)  ) Recent Results (from the past 240 hour(s))  Culture, blood (routine x 2)     Status: None   Collection Time: 03/14/21  5:00 PM   Specimen: BLOOD  Result Value Ref Range Status   Specimen Description   Final    BLOOD RIGHT ANTECUBITAL Performed at [facility], [place] [address], [place], [place] [address]    Special Requests   Final    BOTTLES DRAWN AEROBIC AND ANAEROBIC Blood Culture results may not be optimal due to an excessive volume of blood received in culture bottles Performed at [facility] [address], [place], [place] [address]    Culture   Final    NO GROWTH 5 DAYS Performed at [facility], [place] [address], [place], [place] [address]    Report Status 03/19/2021 FINAL  Final  Culture, blood (routine x 2)     Status: None   Collection Time: 03/14/21  5:13 PM   Specimen: BLOOD  Result Value Ref Range Status   Specimen Description   Final    BLOOD BLOOD LEFT FOREARM Performed at [facility] [address] [place], [place]  [address]  Special Requests   Final    BOTTLES DRAWN AEROBIC AND ANAEROBIC Blood Culture adequate volume Performed at [facility] [address] [place], [place] [address]    Culture   Final    NO GROWTH 5 DAYS Performed at [facility], [place] [address] [place] [address] [place], [place] [address]    Report Status 03/19/2021 FINAL  Final  Resp Panel by RT-PCR (Flu A&B, Covid) Nasopharyngeal Swab     Status: Abnormal   Collection Time: 03/14/21  5:13 PM   Specimen: Nasopharyngeal Swab; Nasopharyngeal(NP) swabs in vial transport medium  Result Value Ref Range Status   SARS Coronavirus 2 by RT PCR NEGATIVE NEGATIVE Final    Comment: (NOTE) SARS-CoV-2 target nucleic acids are NOT DETECTED.  The SARS-CoV-2 RNA is generally detectable in upper respiratory specimens during the acute phase of infection. The lowest concentration of SARS-CoV-2 viral copies this assay can detect is 138 copies/mL. A negative result does not preclude SARS-Cov-2 infection and should not be used as the sole basis for treatment or other patient management decisions. A negative result may occur with  improper specimen collection/handling, submission of specimen other than nasopharyngeal swab, presence of viral mutation(s) within the areas targeted by this assay, and inadequate number of viral copies(<138 copies/mL). A negative result must be combined with clinical observations, patient history, and epidemiological information. The expected result is Negative.  Fact Sheet for Patients:  EntrepreneurPulse.com.au  Fact Sheet for Healthcare Providers:  IncredibleEmployment.be  This test is no t yet approved or cleared by the [place] FDA and  has been authorized for detection and/or diagnosis of SARS-CoV-2 by FDA under an Emergency Use Authorization (EUA). This EUA will remain  in effect (meaning this test can be used) for the duration of the COVID-19 declaration  under Section 564(b)(1) of the Act, 21 U.S.C.section 360bbb-3(b)(1), unless the authorization is terminated  or revoked sooner.       Influenza A by PCR POSITIVE (A) NEGATIVE Final   Influenza B by PCR NEGATIVE NEGATIVE Final    Comment: (NOTE) The Xpert Xpress SARS-CoV-2/FLU/RSV plus assay is intended as an aid in the diagnosis of influenza from Nasopharyngeal swab specimens and should not be used as a sole basis for treatment. Nasal washings and aspirates are unacceptable for Xpert Xpress SARS-CoV-2/FLU/RSV testing.  Fact Sheet for Patients: EntrepreneurPulse.com.au  Fact Sheet for Healthcare Providers: IncredibleEmployment.be  This test is not yet approved or cleared by the [place] FDA and has been authorized for detection and/or diagnosis of SARS-CoV-2 by FDA under an Emergency Use Authorization (EUA). This EUA will remain in effect (meaning this test can be used) for the duration of the COVID-19 declaration under Section 564(b)(1) of the Act, 21 U.S.C. section 360bbb-3(b)(1), unless the authorization is terminated or revoked.  Performed at [facility], [place] [address] [place], [place] [address]   Resp Panel by RT-PCR (Flu A&B, Covid) Nasopharyngeal Swab     Status: None   Collection Time: 03/19/21  1:15 PM   Specimen: Nasopharyngeal Swab; Nasopharyngeal(NP) swabs in vial transport medium  Result Value Ref Range Status   SARS Coronavirus 2 by RT PCR NEGATIVE NEGATIVE Final    Comment: (NOTE) SARS-CoV-2 target nucleic acids are NOT DETECTED.  The SARS-CoV-2 RNA is generally detectable in upper respiratory specimens during the acute phase of infection. The lowest concentration of SARS-CoV-2 viral copies this assay can detect is 138 copies/mL. A negative result does not preclude SARS-Cov-2 infection and should not be  used as the sole basis for treatment or other patient management decisions. A negative result  may occur with  improper specimen collection/handling, submission of specimen other than nasopharyngeal swab, presence of viral mutation(s) within the areas targeted by this assay, and inadequate number of viral copies(<138 copies/mL). A negative result must be combined with clinical observations, patient history, and epidemiological information. The expected result is Negative.  Fact Sheet for Patients:  EntrepreneurPulse.com.au  Fact Sheet for Healthcare Providers:  IncredibleEmployment.be  This test is no t yet approved or cleared by the [place] FDA and  has been authorized for detection and/or diagnosis of SARS-CoV-2 by FDA under an Emergency Use Authorization (EUA). This EUA will remain  in effect (meaning this test can be used) for the duration of the COVID-19 declaration under Section 564(b)(1) of the Act, 21 U.S.C.section 360bbb-3(b)(1), unless the authorization is terminated  or revoked sooner.       Influenza A by PCR NEGATIVE NEGATIVE Final   Influenza B by PCR NEGATIVE NEGATIVE Final    Comment: (NOTE) The Xpert Xpress SARS-CoV-2/FLU/RSV plus assay is intended as an aid in the diagnosis of influenza from Nasopharyngeal swab specimens and should not be used as a sole basis for treatment. Nasal washings and aspirates are unacceptable for Xpert Xpress SARS-CoV-2/FLU/RSV testing.  Fact Sheet for Patients: EntrepreneurPulse.com.au  Fact Sheet for Healthcare Providers: IncredibleEmployment.be  This test is not yet approved or cleared by the [place] FDA and has been authorized for detection and/or diagnosis of SARS-CoV-2 by FDA under an Emergency Use Authorization (EUA). This EUA will remain in effect (meaning this test can be used) for the duration of the COVID-19 declaration under Section 564(b)(1) of the Act, 21 U.S.C. section 360bbb-3(b)(1), unless the authorization is terminated  or revoked.  Performed at [facility], [place] [address], [place], [place] [address]          Radiology Studies: DG Chest 2 View  Result Date: 03/19/2021 CLINICAL DATA:  Shortness of breath EXAM: CHEST - 2 VIEW COMPARISON:  03/14/2021 FINDINGS: Lateral view degraded by patient arm position. Midline trachea. Normal heart size. Atherosclerosis in the transverse aorta. Tiny left pleural effusion, new. No pneumothorax. The Chin overlies the apices minimally. New retrocardiac left lower lobe airspace disease. IMPRESSION: New small left pleural effusion with adjacent atelectasis or infection. Aortic Atherosclerosis (ICD10-I70.0). Electronically Signed   By: Abigail Miyamoto M.D.   On: 03/19/2021 13:39        Scheduled Meds:  bumetanide  1 mg Oral Daily   cholecalciferol  5,000 Units Oral Daily   clonazePAM  1 mg Oral QHS   ferrous sulfate  325 mg Oral Daily   fluticasone furoate-vilanterol  1 puff Inhalation Daily   heparin  5,000 Units Subcutaneous Q8H   insulin aspart  0-5 Units Subcutaneous QHS   insulin aspart  0-9 Units Subcutaneous TID WC   insulin glargine-yfgn  30 Units Subcutaneous QHS   levothyroxine  125 mcg Oral Q0600   methylPREDNISolone (SOLU-MEDROL) injection  80 mg Intravenous Q24H   Followed by   Derrill Memo ON 03/21/2021] predniSONE  40 mg Oral Q breakfast   montelukast  10 mg Oral Daily   pantoprazole  80 mg Oral Daily   PARoxetine  40 mg Oral q morning   rOPINIRole  0.75 mg Oral QHS   rosuvastatin  5 mg Oral Daily   sodium chloride flush  3 mL Intravenous Q12H   traZODone  100 mg Oral QHS   ziprasidone  80 mg Oral QHS   Continuous Infusions:  sodium chloride     azithromycin     cefTRIAXone (ROCEPHIN)  IV       LOS: 1 day   The patient is critically ill with multiple organ systems failure and requires high complexity decision making for assessment and support, frequent evaluation and titration of therapies, application of advanced monitoring  technologies and extensive interpretation of multiple databases. Critical Care Time devoted to patient care services described in this note  Time spent: 40 minutes     Cadi Rhinehart, Geraldo Docker, MD [facility]   If 7PM-7AM, please contact night-coverage 03/20/2021, 8:15 AM

## 2021-03-20 NOTE — TOC Initial Note (Signed)
Transition of Care [facility]) - Initial/Assessment Note   Patient Details  Name: Chelsea [name] MRN: [id number] Date of Birth: Dec 05, 1950  [facility] of [facility] CM/SW Contact:    Sherie Don, LCSW Phone Number: 03/20/2021, 3:26 PM  Clinical Narrative: Readmission checklist completed due to high readmission score. CSW spoke with patient to complete assessment. Per patient, she lives at home with her sister and brother-in-law. Patient is independent with ADLs at baseline. Patient is able to afford her monthly medications and reported she takes these as prescribed. Current DME includes a 3N1, shower chair, and O2 from [facility]. Patient is not currently active with [facility] services. TOC to follow for possible discharge needs.  Expected Discharge Plan: Home/Self Care Barriers to Discharge: Continued Medical Work up  Patient Goals and CMS Choice Patient states their goals for this hospitalization and ongoing recovery are:: Be able to go home Choice offered to / list presented to : NA  Expected Discharge Plan and Services Expected Discharge Plan: Home/Self Care In-house Referral: Clinical Social Work Post Acute Care Choice: NA Living arrangements for the past 2 months: Single Family Home          DME Arranged: N/A DME Agency: NA  Prior Living Arrangements/Services Living arrangements for the past 2 months: Single Family Home Lives with:: Relatives, Siblings Patient language and need for interpreter reviewed:: Yes Do you feel safe going back to the place where you live?: Yes      Need for Family Participation in Patient Care: No (Comment) Care giver support system in place?: Yes (comment) Current home services: DME (O2 through [facility], 3N1, shower chair) Criminal Activity/Legal Involvement Pertinent to Current Situation/Hospitalization: No - Comment as needed  Activities of Daily Living Home Assistive Devices/Equipment: Eyeglasses, Dentures (specify type), Bedside commode/3-in-1, Shower chair  with back, Grab bars in shower ADL Screening (condition at time of admission) Patient's cognitive ability adequate to safely complete daily activities?: Yes Is the patient deaf or have difficulty hearing?: No Does the patient have difficulty seeing, even when wearing glasses/contacts?: Yes Does the patient have difficulty concentrating, remembering, or making decisions?: Yes Patient able to express need for assistance with ADLs?: Yes Does the patient have difficulty dressing or bathing?: No Independently performs ADLs?: Yes (appropriate for developmental age) Does the patient have difficulty walking or climbing stairs?: Yes Weakness of Legs: Both Weakness of Arms/Hands: None  Emotional Assessment Appearance:: Appears stated age Attitude/Demeanor/Rapport: Engaged Affect (typically observed): Accepting Orientation: : Oriented to Self, Oriented to Place, Oriented to  Time, Oriented to Situation Alcohol / Substance Use: Not Applicable Psych Involvement: No (comment)  Admission diagnosis:  COPD exacerbation ([place]) [J44.1] Community acquired pneumonia of left lower lobe of lung [J18.9] Patient Active Problem List   Diagnosis Date Noted   COPD exacerbation ([place]) 03/19/2021   Chronic respiratory failure with hypoxia ([place]) 03/19/2021   CKD (chronic kidney disease) stage 3, GFR 30-59 ml/min ([place]) 03/19/2021   Chronic diastolic CHF (congestive heart failure) ([place]) 03/19/2021   AKI (acute kidney injury) ([place]) 03/14/2021   Influenza A 03/14/2021   Paranoid ([place]) 09/28/2020   Anxiety 09/28/2020   HLD (hyperlipidemia) 09/28/2020   GI bleed 09/28/2020   Acute blood loss anemia 09/27/2020   Pain due to onychomycosis of toenails of both feet 11/05/2018   Diabetes mellitus without complication ([place]) 69/48/5462   Special screening for malignant neoplasms, colon 01/30/2017   Dysphagia 06/10/2016   Stricture and stenosis of esophagus 06/10/2016   Food impaction of esophagus 06/03/2016   Peripheral  edema 12/04/2012   Morbid obesity ([place]) 12/04/2012   Type 2 diabetes mellitus without complication ([place]) 25/95/6387   Chest pain 12/03/2012   Dyspnea 12/03/2012   Insomnia 06/03/2012   Abnormal mammogram with microcalcification 04/23/2012   Combined hyperlipidemia associated with type 2 diabetes mellitus ([place]) 06/20/2011   Allergic rhinitis 01/10/2011   Mild concentric left ventricular hypertrophy (LVH) 11/05/2010   Hypothyroidism 06/07/2010   COPD (chronic obstructive pulmonary disease) ([place]) 03/28/2010   Bipolar I disorder, single manic episode ([place]) 02/19/2010   OSA (obstructive sleep apnea) 09/21/2009   Essential hypertension 12/27/2008   DIZZINESS 12/27/2008   DYSPNEA 12/27/2008   PCP:  [facility] Pharmacy:   [facility] - [place], [place] - [address] [place] [address] Phone: [phone] Fax: [phone]  Readmission Risk Interventions Readmission Risk Prevention Plan 03/20/2021  Transportation Screening Complete  Medication Review (RN Care Manager) Complete  HRI or [place] Complete  SW Recovery Care/Counseling Consult Complete  [place] Not Applicable  Some recent data might be hidden

## 2021-03-20 NOTE — Progress Notes (Signed)
Initial Nutrition Assessment  DOCUMENTATION CODES:   Non-severe (moderate) malnutrition in context of chronic illness, Obesity unspecified  INTERVENTION:   -Ensure MAX Protein po BID, each supplement provides 150 kcal and 30 grams of protein   -Multivitamin with minerals daily  NUTRITION DIAGNOSIS:   Moderate Malnutrition related to chronic illness as evidenced by mild fat depletion, moderate muscle depletion, percent weight loss, energy intake < or equal to 75% for > or equal to 1 month.  GOAL:   Patient will meet greater than or equal to 90% of their needs  MONITOR:   PO intake, Supplement acceptance, Weight trends, I & O's, Labs, Skin  REASON FOR ASSESSMENT:   Malnutrition Screening Tool    ASSESSMENT:   70 y.o. female with medical history significant of COPD, chronic respiratory failure on 2 L oxygen, severe OSA (recently diagnosed and not yet on CPAP), CKD stage IIIb, hyperlipidemia, hypothyroidism, diabetes mellitus, chronic diastolic heart failure, anxiety and bipolar disorder who presents to the hospital with shortness of breath.  Patient in room with sister at bedside. Pt's sister states pt lives with her and she has noticed a decline in pt's mobility and PO intakes over the past few months d/t pt having ill fitting dentures, trouble swallowing (h/o esophageal stretches), and then she recently had the flu so she has had trouble breathing.  Pt states she is eating better now. Consumed 100% of her breakfast and lunch today. Blood sugar was checked during visit by nurse tech and it was >300. Pt's sister states she usually had well controlled blood sugars.  Pt is agreeable to receiving protein supplements.   Per weight records, pt has lost 38 lbs since 6/30 (16% wt loss x 6 months, significant for time frame).   Medications: Vitamin D, Ferrous sulfate, Multivitamin with minerals daily  Labs reviewed:  CBGs: 191-357  NUTRITION - FOCUSED PHYSICAL EXAM:  Flowsheet Row  Most Recent Value  Orbital Region Mild depletion  Upper Arm Region Mild depletion  Thoracic and Lumbar Region No depletion  Buccal Region Moderate depletion  Temple Region Mild depletion  Clavicle Bone Region Mild depletion  Clavicle and Acromion Bone Region Moderate depletion  Scapular Bone Region Moderate depletion  Dorsal Hand Moderate depletion  Patellar Region Moderate depletion  Anterior Thigh Region Moderate depletion  Posterior Calf Region Moderate depletion  Edema (RD Assessment) None  Hair Reviewed  [thin]  Eyes Reviewed  Mouth Reviewed  [ill-fitting dentures]  Skin Reviewed       Diet Order:   Diet Order             Diet Carb Modified Fluid consistency: Thin; Room service appropriate? Yes  Diet effective now                   EDUCATION NEEDS:   Education needs have been addressed  Skin:  Skin Assessment: Skin Integrity Issues: Skin Integrity Issues:: Stage II Stage II: coccyx  Last BM:  12/20  Height:   Ht Readings from Last 1 Encounters:  03/19/21 5\' 2"  (1.575 m)    Weight:   Wt Readings from Last 1 Encounters:  03/19/21 84.5 kg    BMI:  Body mass index is 34.07 kg/m.  Estimated Nutritional Needs:   Kcal:  1700-1900  Protein:  65-75g  Fluid:  1.9L/day  Clayton Bibles, MS, RD, LDN Inpatient Clinical Dietitian Contact information available via Amion

## 2021-03-21 DIAGNOSIS — E440 Moderate protein-calorie malnutrition: Secondary | ICD-10-CM | POA: Diagnosis present

## 2021-03-21 LAB — CBC WITH DIFFERENTIAL/PLATELET
Abs Immature Granulocytes: 0.1 10*3/uL — ABNORMAL HIGH (ref 0.00–0.07)
Basophils Absolute: 0 10*3/uL (ref 0.0–0.1)
Basophils Relative: 0 %
Eosinophils Absolute: 0 10*3/uL (ref 0.0–0.5)
Eosinophils Relative: 0 %
HCT: 26.5 % — ABNORMAL LOW (ref 36.0–46.0)
Hemoglobin: 8.5 g/dL — ABNORMAL LOW (ref 12.0–15.0)
Immature Granulocytes: 1 %
Lymphocytes Relative: 11 %
Lymphs Abs: 1.4 10*3/uL (ref 0.7–4.0)
MCH: 25.8 pg — ABNORMAL LOW (ref 26.0–34.0)
MCHC: 32.1 g/dL (ref 30.0–36.0)
MCV: 80.5 fL (ref 80.0–100.0)
Monocytes Absolute: 1 10*3/uL (ref 0.1–1.0)
Monocytes Relative: 8 %
Neutro Abs: 10.1 10*3/uL — ABNORMAL HIGH (ref 1.7–7.7)
Neutrophils Relative %: 80 %
Platelets: 485 10*3/uL — ABNORMAL HIGH (ref 150–400)
RBC: 3.29 MIL/uL — ABNORMAL LOW (ref 3.87–5.11)
RDW: 13.2 % (ref 11.5–15.5)
WBC: 12.7 10*3/uL — ABNORMAL HIGH (ref 4.0–10.5)
nRBC: 0 % (ref 0.0–0.2)

## 2021-03-21 LAB — COMPREHENSIVE METABOLIC PANEL
ALT: 11 U/L (ref 0–44)
AST: 17 U/L (ref 15–41)
Albumin: 2.7 g/dL — ABNORMAL LOW (ref 3.5–5.0)
Alkaline Phosphatase: 67 U/L (ref 38–126)
Anion gap: 9 (ref 5–15)
BUN: 40 mg/dL — ABNORMAL HIGH (ref 8–23)
CO2: 26 mmol/L (ref 22–32)
Calcium: 8.9 mg/dL (ref 8.9–10.3)
Chloride: 103 mmol/L (ref 98–111)
Creatinine, Ser: 1.36 mg/dL — ABNORMAL HIGH (ref 0.44–1.00)
GFR, Estimated: 42 mL/min — ABNORMAL LOW (ref >60)
Glucose, Bld: 121 mg/dL — ABNORMAL HIGH (ref 70–99)
Potassium: 3.8 mmol/L (ref 3.5–5.1)
Sodium: 138 mmol/L (ref 135–145)
Total Bilirubin: 0.3 mg/dL (ref 0.3–1.2)
Total Protein: 6.5 g/dL (ref 6.5–8.1)

## 2021-03-21 LAB — GLUCOSE, CAPILLARY
Glucose-Capillary: 119 mg/dL — ABNORMAL HIGH (ref 70–99)
Glucose-Capillary: 156 mg/dL — ABNORMAL HIGH (ref 70–99)
Glucose-Capillary: 160 mg/dL — ABNORMAL HIGH (ref 70–99)
Glucose-Capillary: 190 mg/dL — ABNORMAL HIGH (ref 70–99)
Glucose-Capillary: 333 mg/dL — ABNORMAL HIGH (ref 70–99)
Glucose-Capillary: 419 mg/dL — ABNORMAL HIGH (ref 70–99)

## 2021-03-21 LAB — MAGNESIUM: Magnesium: 2.1 mg/dL (ref 1.7–2.4)

## 2021-03-21 LAB — PHOSPHORUS: Phosphorus: 3.2 mg/dL (ref 2.5–4.6)

## 2021-03-21 MED ORDER — INSULIN ASPART 100 UNIT/ML IJ SOLN
16.0000 [IU] | Freq: Three times a day (TID) | INTRAMUSCULAR | Status: DC
  Administered 2021-03-21 – 2021-03-23 (×3): 16 [IU] via SUBCUTANEOUS

## 2021-03-21 MED ORDER — METOPROLOL TARTRATE 12.5 MG HALF TABLET
12.5000 mg | ORAL_TABLET | Freq: Two times a day (BID) | ORAL | Status: DC
  Administered 2021-03-21 – 2021-03-22 (×4): 12.5 mg via ORAL
  Filled 2021-03-21 (×5): qty 1

## 2021-03-21 MED ORDER — INSULIN ASPART 100 UNIT/ML IJ SOLN
0.0000 [IU] | INTRAMUSCULAR | Status: DC
  Administered 2021-03-21: 17:00:00 20 [IU] via SUBCUTANEOUS
  Administered 2021-03-21: 21:00:00 4 [IU] via SUBCUTANEOUS
  Administered 2021-03-22: 20:00:00 20 [IU] via SUBCUTANEOUS
  Administered 2021-03-22 – 2021-03-23 (×2): 7 [IU] via SUBCUTANEOUS

## 2021-03-21 MED ORDER — IPRATROPIUM-ALBUTEROL 0.5-2.5 (3) MG/3ML IN SOLN
3.0000 mL | Freq: Three times a day (TID) | RESPIRATORY_TRACT | Status: DC
Start: 2021-03-22 — End: 2021-03-22
  Administered 2021-03-22: 09:00:00 3 mL via RESPIRATORY_TRACT
  Filled 2021-03-21: qty 3

## 2021-03-21 NOTE — Progress Notes (Signed)
Patient continues to decline nocturnal CPAP with the plan to have a home fitting next week with the DME for her home CPAP supplies.

## 2021-03-21 NOTE — Progress Notes (Addendum)
PROGRESS NOTE    Chelsea [name]  FMB:[id number] DOB: March 17, 1951 DOA: 03/19/2021 PCP: [facility]   Brief Narrative:  70 y.o. WF PMHx Anxiety and Bipolar disorder, COPD, chronic respiratory failure on 2 L oxygen, severe OSA (recently diagnosed and not yet on CPAP), CKD stage IIIb, hyperlipidemia, hypothyroidism, diabetes mellitus, chronic diastolic CHF,   Presents to the hospital with shortness of breath.  She was recently hospitalized for GI bleeding and was sent home a couple of days ago.  She admits to shortness of breath, dry cough.  No fevers, chest pain, nausea, vomiting, diarrhea, or abdominal pain.  No further GI bleeding since discharge.   ED Course: Chest x-ray revealed new small left pleural effusion with adjacent atelectasis or infection.  Patient was given Rocephin, azithromycin, Solu-Medrol.   Subjective: 12/22 afebrile overnight     Assessment & Plan:  Covid vaccination;  Principal Problem:   COPD exacerbation ([place]) Active Problems:   Type 2 diabetes mellitus without complication (HCC)   Bipolar I disorder, single manic episode (HCC)   Hypothyroidism   OSA (obstructive sleep apnea)   Anxiety   HLD (hyperlipidemia)   Chronic respiratory failure with hypoxia (HCC)   CKD (chronic kidney disease) stage 3, GFR 30-59 ml/min (HCC)   Chronic diastolic CHF (congestive heart failure) (HCC)   Moderate malnutrition (HCC)  COPD exacerbation/Left lower lobe HCAP -Breathing tx, Breo, solumedrol -Check MRSA PCR, if positive can consider vancomycin -Continue rocephin, azithromycin -Multimorbid order set for pneumonia and COPD utilized  -DuoNeb  QID -Flutter valve - Incentive spirometry -12/22 decreased Prednisone 20 mg daily   Recent influenza A  -Supportive care    Acute on Chronic respiratory failure with hypoxia -On 2L [place] O2 at baseline -12/21 currently on 3 L O2 -Titrate O2 to maintain SPO2> 92%  Lung nodule? - 12/21 Per patient and sister 1  physician told them that patient had lung nodule which needed further evaluation, another physician informed them not to worry about a lung nodule. - 12/21 we will review CXR, if patient has lung nodule we will obtain chest CT. -12/22 reviewed CXR lung nodule RULED OUT  Chronic diastolic CHF -Continue bumex  -12/22 Metoprolol 12.5 mg BID -Strict in and out +112ml - Daily weight Filed Weights   03/19/21 2014  Weight: 84.5 kg     OSA -Patient was recently diagnosed with severe OSA, she has not gotten her CPAP mask yet   Recent admission for symptomatic anemia with GI bleed -Outpatient GI follow up in 4-6 weeks  Lab Results  Component Value Date   HGB 8.5 (L) 03/21/2021   HGB 8.1 (L) 03/20/2021   HGB 8.6 (L) 03/17/2021   HGB 8.6 (L) 03/16/2021   HGB 8.2 (L) 03/15/2021  -Stable - Transfuse for hemoglobin<7    CKD stage 3b(-Baseline Cr 1.2-1.3) Lab Results  Component Value Date   CREATININE 1.36 (H) 03/21/2021   CREATININE 1.35 (H) 03/20/2021   CREATININE 1.45 (H) 03/19/2021   CREATININE 1.19 (H) 03/17/2021   CREATININE 1.29 (H) 03/16/2021  -12/21 close to baseline we will continue to monitor.   HLD -Continue crestor    Hypothyroidism -Synthroid 125 mcg daily   DM type 2 controlled with hyperglycemia -12/21 hemoglobin A1c= 6.5 -12/21 Semglee 40 units daily -12/22 increase NovoLog 16 units QAC -12/22 increase resistant SSI -12/22 have decreased prednisone see COPD   Anxiety and Bipolar disorder -Continue paxil, trazodone, geodon, klonopin    Obese (BMI 34.07 kg/m)  Moderate malnutrition -Treatment  per nutrition  Goals of care - 12/22 informed that she lives with her sister and brother-in-law.  And they are physical therapist retired she therefore desires not to go to an SNF   DVT prophylaxis: Subcu heparin Code Status: Full Family Communication:  Status is: Inpatient    Dispo: The patient is from: Home              Anticipated d/c is to: Home               Anticipated d/c date is: 3 days              Patient currently is not medically stable to d/c.      Consultants:    Procedures/Significant Events:     I have personally reviewed and interpreted all radiology studies and my findings are as above.  VENTILATOR SETTINGS: Nasal cannula 2 L O2, 12/21 SPO2 96%    Cultures 12/20 influenza A/B negative 12/20 SARS coronavirus negative   Antimicrobials: Anti-infectives (From admission, onward)    Start     Ordered Stop   03/20/21 1400  cefTRIAXone (ROCEPHIN) 2 g in sodium chloride 0.9 % 100 mL IVPB        03/19/21 2217 03/25/21 1359   03/20/21 1400  azithromycin (ZITHROMAX) 500 mg in sodium chloride 0.9 % 250 mL IVPB        03/19/21 2217 03/25/21 1359   03/19/21 1430  cefTRIAXone (ROCEPHIN) 1 g in sodium chloride 0.9 % 100 mL IVPB        03/19/21 1415 03/19/21 1515   03/19/21 1430  azithromycin (ZITHROMAX) tablet 500 mg        03/19/21 1415 03/19/21 1446           Devices    LINES / TUBES:      Continuous Infusions:  sodium chloride     azithromycin 500 mg (03/21/21 1449)   cefTRIAXone (ROCEPHIN)  IV 2 g (03/21/21 1352)     Objective: Vitals:   03/20/21 2148 03/21/21 0610 03/21/21 0732 03/21/21 1223  BP: (!) 142/57 (!) 154/70  (!) 173/65  Pulse: 74 61  90  Resp: 17 18  18   Temp: 97.8 F (36.6 C) 97.7 F (36.5 C)  97.6 F (36.4 C)  TempSrc: Oral Oral  Oral  SpO2: 95% 98% 96% 93%  Weight:      Height:        Intake/Output Summary (Last 24 hours) at 03/21/2021 2017 Last data filed at 03/21/2021 1615 Gross per 24 hour  Intake 730 ml  Output 0 ml  Net 730 ml   Filed Weights   03/19/21 2014  Weight: 84.5 kg    Examination:  General: A/O x4, positive acute on chronic respiratory distress Eyes: negative scleral hemorrhage, negative anisocoria, negative icterus ENT: Negative Runny nose, negative gingival bleeding, Neck:  Negative scars, masses, torticollis, lymphadenopathy, JVD Lungs:  decreased breath sounds bilaterally, positive expiratory wheezes Cardiovascular: Regular rate and rhythm without murmur gallop or rub normal S1 and S2 Abdomen: negative abdominal pain, nondistended, positive soft, bowel sounds, no rebound, no ascites, no appreciable mass Extremities: No significant cyanosis, clubbing, or edema bilateral lower extremities Skin: Negative rashes, lesions, ulcers Psychiatric:  Negative depression, negative anxiety, negative fatigue, negative mania  Central nervous system:  Cranial nerves II through XII intact, tongue/uvula midline, all extremities muscle strength 5/5, sensation intact throughout, negative dysarthria, negative expressive aphasia, negative receptive aphasia.  .     Data Reviewed: Care during the described  time interval was provided by me .  I have reviewed this patient's available data, including medical history, events of note, physical examination, and all test results as part of my evaluation.   CBC: Recent Labs  Lab 03/15/21 0515 03/16/21 0608 03/17/21 0541 03/20/21 0444 03/21/21 0419  WBC 7.4 13.8* 11.2* 7.6 12.7*  NEUTROABS  --   --   --   --  10.1*  HGB 8.2* 8.6* 8.6* 8.1* 8.5*  HCT 26.6* 27.8* 28.7* 25.7* 26.5*  MCV 83.4 84.2 85.9 81.1 80.5  PLT 260 315 292 370 620*   Basic Metabolic Panel: Recent Labs  Lab 03/16/21 0608 03/17/21 0541 03/19/21 1315 03/19/21 2312 03/20/21 0444 03/21/21 0419  NA 135 140 135  --  136 138  K 4.4 4.5 4.1  --  4.1 3.8  CL 103 109 104  --  103 103  CO2 26 25 23   --  25 26  GLUCOSE 152* 128* 238* 502* 312* 121*  BUN 18 16 21   --  25* 40*  CREATININE 1.29* 1.19* 1.45*  --  1.35* 1.36*  CALCIUM 8.2* 8.4* 8.4*  --  8.6* 8.9  MG  --   --   --   --   --  2.1  PHOS  --   --   --   --   --  3.2   GFR: Estimated Creatinine Clearance: 39.9 mL/min (A) (by C-G formula based on SCr of 1.36 mg/dL (H)). Liver Function Tests: Recent Labs  Lab 03/16/21 0608 03/17/21 0541 03/21/21 0419  AST 14* 9*  17  ALT 11 11 11   ALKPHOS 71 69 67  BILITOT 0.4 0.4 0.3  PROT 5.9* 5.6* 6.5  ALBUMIN 2.6* 2.5* 2.7*   No results for input(s): LIPASE, AMYLASE in the last 168 hours. No results for input(s): AMMONIA in the last 168 hours. Coagulation Profile: No results for input(s): INR, PROTIME in the last 168 hours. Cardiac Enzymes: No results for input(s): CKTOTAL, CKMB, CKMBINDEX, TROPONINI in the last 168 hours. BNP (last 3 results) No results for input(s): PROBNP in the last 8760 hours. HbA1C: Recent Labs    03/20/21 0444  HGBA1C 6.5*   CBG: Recent Labs  Lab 03/21/21 0000 03/21/21 0405 03/21/21 0738 03/21/21 1128 03/21/21 1530  GLUCAP 160* 119* 156* 333* 419*   Lipid Profile: Recent Labs    03/20/21 0444  CHOL 109  HDL 32*  LDLCALC 65  TRIG 61  CHOLHDL 3.4   Thyroid Function Tests: No results for input(s): TSH, T4TOTAL, FREET4, T3FREE, THYROIDAB in the last 72 hours. Anemia Panel: No results for input(s): VITAMINB12, FOLATE, FERRITIN, TIBC, IRON, RETICCTPCT in the last 72 hours. Urine analysis:    Component Value Date/Time   COLORURINE YELLOW 09/27/2020 1846   APPEARANCEUR CLEAR 09/27/2020 1846   LABSPEC 1.005 09/27/2020 1846   PHURINE 6.0 09/27/2020 1846   GLUCOSEU NEGATIVE 09/27/2020 1846   HGBUR NEGATIVE 09/27/2020 1846   BILIRUBINUR NEGATIVE 09/27/2020 1846   KETONESUR NEGATIVE 09/27/2020 1846   PROTEINUR NEGATIVE 09/27/2020 1846   UROBILINOGEN 1.0 12/03/2012 1531   NITRITE NEGATIVE 09/27/2020 1846   LEUKOCYTESUR LARGE (A) 09/27/2020 1846   Sepsis Labs: @LABRCNTIP (procalcitonin:4,lacticidven:4)  ) Recent Results (from the past 240 hour(s))  Culture, blood (routine x 2)     Status: None   Collection Time: 03/14/21  5:00 PM   Specimen: BLOOD  Result Value Ref Range Status   Specimen Description   Final    BLOOD RIGHT ANTECUBITAL Performed at [facility]  [place] [address] [place], [place] [address]    Special Requests   Final     BOTTLES DRAWN AEROBIC AND ANAEROBIC Blood Culture results may not be optimal due to an excessive volume of blood received in culture bottles Performed at [place] [address], [place], [place] [address]    Culture   Final    NO GROWTH 5 DAYS Performed at [facility], [place] [address], [place], [place] [address]    Report Status 03/19/2021 FINAL  Final  Culture, blood (routine x 2)     Status: None   Collection Time: 03/14/21  5:13 PM   Specimen: BLOOD  Result Value Ref Range Status   Specimen Description   Final    BLOOD BLOOD LEFT FOREARM Performed at [place] [address], [place], [place] [address]    Special Requests   Final    BOTTLES DRAWN AEROBIC AND ANAEROBIC Blood Culture adequate volume Performed at [place] [address], [place], [place] [address]    Culture   Final    NO GROWTH 5 DAYS Performed at [facility], [place] [address] [place], [place] [address]    Report Status 03/19/2021 FINAL  Final  Resp Panel by RT-PCR (Flu A&B, Covid) Nasopharyngeal Swab     Status: Abnormal   Collection Time: 03/14/21  5:13 PM   Specimen: Nasopharyngeal Swab; Nasopharyngeal(NP) swabs in vial transport medium  Result Value Ref Range Status   SARS Coronavirus 2 by RT PCR NEGATIVE NEGATIVE Final    Comment: (NOTE) SARS-CoV-2 target nucleic acids are NOT DETECTED.  The SARS-CoV-2 RNA is generally detectable in upper respiratory specimens during the acute phase of infection. The lowest concentration of SARS-CoV-2 viral copies this assay can detect is 138 copies/mL. A negative result does not preclude SARS-Cov-2 infection and should not be used as the sole basis for treatment or other patient management decisions. A negative result may occur with  improper specimen collection/handling, submission of specimen other than nasopharyngeal swab, presence of viral mutation(s) within the areas targeted by  this assay, and inadequate number of viral copies(<138 copies/mL). A negative result must be combined with clinical observations, patient history, and epidemiological information. The expected result is Negative.  Fact Sheet for Patients:  EntrepreneurPulse.com.au  Fact Sheet for Healthcare Providers:  IncredibleEmployment.be  This test is no t yet approved or cleared by the [place] FDA and  has been authorized for detection and/or diagnosis of SARS-CoV-2 by FDA under an Emergency Use Authorization (EUA). This EUA will remain  in effect (meaning this test can be used) for the duration of the COVID-19 declaration under Section 564(b)(1) of the Act, 21 U.S.C.section 360bbb-3(b)(1), unless the authorization is terminated  or revoked sooner.       Influenza A by PCR POSITIVE (A) NEGATIVE Final   Influenza B by PCR NEGATIVE NEGATIVE Final    Comment: (NOTE) The Xpert Xpress SARS-CoV-2/FLU/RSV plus assay is intended as an aid in the diagnosis of influenza from Nasopharyngeal swab specimens and should not be used as a sole basis for treatment. Nasal washings and aspirates are unacceptable for Xpert Xpress SARS-CoV-2/FLU/RSV testing.  Fact Sheet for Patients: EntrepreneurPulse.com.au  Fact Sheet for Healthcare Providers: IncredibleEmployment.be  This test is not yet approved or cleared by the [place] FDA and has been authorized for detection and/or diagnosis of SARS-CoV-2 by FDA under an Emergency Use Authorization (EUA). This EUA will remain  in effect (meaning this test can be used) for the duration of the COVID-19 declaration under Section 564(b)(1) of the Act, 21 U.S.C. section 360bbb-3(b)(1), unless the authorization is terminated or revoked.  Performed at [facility], [place] [address] [place], [place] [address]   Resp Panel by RT-PCR (Flu A&B, Covid) Nasopharyngeal Swab      Status: None   Collection Time: 03/19/21  1:15 PM   Specimen: Nasopharyngeal Swab; Nasopharyngeal(NP) swabs in vial transport medium  Result Value Ref Range Status   SARS Coronavirus 2 by RT PCR NEGATIVE NEGATIVE Final    Comment: (NOTE) SARS-CoV-2 target nucleic acids are NOT DETECTED.  The SARS-CoV-2 RNA is generally detectable in upper respiratory specimens during the acute phase of infection. The lowest concentration of SARS-CoV-2 viral copies this assay can detect is 138 copies/mL. A negative result does not preclude SARS-Cov-2 infection and should not be used as the sole basis for treatment or other patient management decisions. A negative result may occur with  improper specimen collection/handling, submission of specimen other than nasopharyngeal swab, presence of viral mutation(s) within the areas targeted by this assay, and inadequate number of viral copies(<138 copies/mL). A negative result must be combined with clinical observations, patient history, and epidemiological information. The expected result is Negative.  Fact Sheet for Patients:  EntrepreneurPulse.com.au  Fact Sheet for Healthcare Providers:  IncredibleEmployment.be  This test is no t yet approved or cleared by the [place] FDA and  has been authorized for detection and/or diagnosis of SARS-CoV-2 by FDA under an Emergency Use Authorization (EUA). This EUA will remain  in effect (meaning this test can be used) for the duration of the COVID-19 declaration under Section 564(b)(1) of the Act, 21 U.S.C.section 360bbb-3(b)(1), unless the authorization is terminated  or revoked sooner.       Influenza A by PCR NEGATIVE NEGATIVE Final   Influenza B by PCR NEGATIVE NEGATIVE Final    Comment: (NOTE) The Xpert Xpress SARS-CoV-2/FLU/RSV plus assay is intended as an aid in the diagnosis of influenza from Nasopharyngeal swab specimens and should not be used as a sole basis  for treatment. Nasal washings and aspirates are unacceptable for Xpert Xpress SARS-CoV-2/FLU/RSV testing.  Fact Sheet for Patients: EntrepreneurPulse.com.au  Fact Sheet for Healthcare Providers: IncredibleEmployment.be  This test is not yet approved or cleared by the [place] FDA and has been authorized for detection and/or diagnosis of SARS-CoV-2 by FDA under an Emergency Use Authorization (EUA). This EUA will remain in effect (meaning this test can be used) for the duration of the COVID-19 declaration under Section 564(b)(1) of the Act, 21 U.S.C. section 360bbb-3(b)(1), unless the authorization is terminated or revoked.  Performed at [facility], [place] [address], [place], [place] [address]   MRSA Next Gen by PCR, Nasal     Status: None   Collection Time: 03/20/21  5:27 AM   Specimen: Nasal Mucosa; Nasal Swab  Result Value Ref Range Status   MRSA by PCR Next Gen NOT DETECTED NOT DETECTED Final    Comment: (NOTE) The GeneXpert MRSA Assay (FDA approved for NASAL specimens only), is one component of a comprehensive MRSA colonization surveillance program. It is not intended to diagnose MRSA infection nor to guide or monitor treatment for MRSA infections. Test performance is not FDA approved in patients less than 63 years old. Performed at [facility], [place] [address] [place] [address] [place], [place] [address]          Radiology Studies: No  results found.      Scheduled Meds:  bumetanide  1 mg Oral Daily   cholecalciferol  5,000 Units Oral Daily   clonazePAM  1 mg Oral QHS   ferrous sulfate  325 mg Oral Daily   fluticasone furoate-vilanterol  1 puff Inhalation Daily   heparin  5,000 Units Subcutaneous Q8H   insulin aspart  0-20 Units Subcutaneous Q4H   insulin aspart  16 Units Subcutaneous TID WC   insulin glargine-yfgn  40 Units Subcutaneous QHS   ipratropium-albuterol  3 mL Nebulization QID    levothyroxine  125 mcg Oral Q0600   metoprolol tartrate  12.5 mg Oral BID   montelukast  10 mg Oral Daily   multivitamin with minerals  1 tablet Oral Daily   pantoprazole  80 mg Oral Daily   PARoxetine  40 mg Oral q morning   predniSONE  40 mg Oral Q breakfast   Ensure Max Protein  11 oz Oral BID   rOPINIRole  0.75 mg Oral QHS   rosuvastatin  5 mg Oral Daily   sodium chloride flush  3 mL Intravenous Q12H   traZODone  100 mg Oral QHS   ziprasidone  80 mg Oral QHS   Continuous Infusions:  sodium chloride     azithromycin 500 mg (03/21/21 1449)   cefTRIAXone (ROCEPHIN)  IV 2 g (03/21/21 1352)     LOS: 2 days   The patient is critically ill with multiple organ systems failure and requires high complexity decision making for assessment and support, frequent evaluation and titration of therapies, application of advanced monitoring technologies and extensive interpretation of multiple databases. Critical Care Time devoted to patient care services described in this note  Time spent: 40 minutes     Franziska Podgurski, Geraldo Docker, MD [facility]   If 7PM-7AM, please contact night-coverage 03/21/2021, 8:17 PM

## 2021-03-22 LAB — CBC WITH DIFFERENTIAL/PLATELET
Abs Immature Granulocytes: 0.11 10*3/uL — ABNORMAL HIGH (ref 0.00–0.07)
Basophils Absolute: 0 10*3/uL (ref 0.0–0.1)
Basophils Relative: 0 %
Eosinophils Absolute: 0 10*3/uL (ref 0.0–0.5)
Eosinophils Relative: 0 %
HCT: 24.2 % — ABNORMAL LOW (ref 36.0–46.0)
Hemoglobin: 7.5 g/dL — ABNORMAL LOW (ref 12.0–15.0)
Immature Granulocytes: 1 %
Lymphocytes Relative: 29 %
Lymphs Abs: 2.6 10*3/uL (ref 0.7–4.0)
MCH: 25.7 pg — ABNORMAL LOW (ref 26.0–34.0)
MCHC: 31 g/dL (ref 30.0–36.0)
MCV: 82.9 fL (ref 80.0–100.0)
Monocytes Absolute: 0.9 10*3/uL (ref 0.1–1.0)
Monocytes Relative: 10 %
Neutro Abs: 5.4 10*3/uL (ref 1.7–7.7)
Neutrophils Relative %: 60 %
Platelets: 410 10*3/uL — ABNORMAL HIGH (ref 150–400)
RBC: 2.92 MIL/uL — ABNORMAL LOW (ref 3.87–5.11)
RDW: 13.1 % (ref 11.5–15.5)
WBC: 9 10*3/uL (ref 4.0–10.5)
nRBC: 0 % (ref 0.0–0.2)

## 2021-03-22 LAB — COMPREHENSIVE METABOLIC PANEL
ALT: 15 U/L (ref 0–44)
AST: 15 U/L (ref 15–41)
Albumin: 2.4 g/dL — ABNORMAL LOW (ref 3.5–5.0)
Alkaline Phosphatase: 55 U/L (ref 38–126)
Anion gap: 10 (ref 5–15)
BUN: 44 mg/dL — ABNORMAL HIGH (ref 8–23)
CO2: 27 mmol/L (ref 22–32)
Calcium: 8.7 mg/dL — ABNORMAL LOW (ref 8.9–10.3)
Chloride: 105 mmol/L (ref 98–111)
Creatinine, Ser: 1.28 mg/dL — ABNORMAL HIGH (ref 0.44–1.00)
GFR, Estimated: 46 mL/min — ABNORMAL LOW (ref >60)
Glucose, Bld: 91 mg/dL (ref 70–99)
Potassium: 4.1 mmol/L (ref 3.5–5.1)
Sodium: 142 mmol/L (ref 135–145)
Total Bilirubin: 0.4 mg/dL (ref 0.3–1.2)
Total Protein: 5.5 g/dL — ABNORMAL LOW (ref 6.5–8.1)

## 2021-03-22 LAB — GLUCOSE, RANDOM: Glucose, Bld: 475 mg/dL — ABNORMAL HIGH (ref 70–99)

## 2021-03-22 LAB — GLUCOSE, CAPILLARY
Glucose-Capillary: 115 mg/dL — ABNORMAL HIGH (ref 70–99)
Glucose-Capillary: 116 mg/dL — ABNORMAL HIGH (ref 70–99)
Glucose-Capillary: 177 mg/dL — ABNORMAL HIGH (ref 70–99)
Glucose-Capillary: 234 mg/dL — ABNORMAL HIGH (ref 70–99)
Glucose-Capillary: 249 mg/dL — ABNORMAL HIGH (ref 70–99)
Glucose-Capillary: 363 mg/dL — ABNORMAL HIGH (ref 70–99)
Glucose-Capillary: 436 mg/dL — ABNORMAL HIGH (ref 70–99)
Glucose-Capillary: 66 mg/dL — ABNORMAL LOW (ref 70–99)

## 2021-03-22 LAB — HEMOGLOBIN AND HEMATOCRIT, BLOOD
HCT: 28.6 % — ABNORMAL LOW (ref 36.0–46.0)
Hemoglobin: 8.9 g/dL — ABNORMAL LOW (ref 12.0–15.0)

## 2021-03-22 LAB — PHOSPHORUS: Phosphorus: 3.6 mg/dL (ref 2.5–4.6)

## 2021-03-22 LAB — MAGNESIUM: Magnesium: 2 mg/dL (ref 1.7–2.4)

## 2021-03-22 MED ORDER — IPRATROPIUM-ALBUTEROL 0.5-2.5 (3) MG/3ML IN SOLN
3.0000 mL | Freq: Two times a day (BID) | RESPIRATORY_TRACT | Status: DC
Start: 2021-03-22 — End: 2021-03-23
  Administered 2021-03-22 – 2021-03-23 (×2): 3 mL via RESPIRATORY_TRACT
  Filled 2021-03-22 (×2): qty 3

## 2021-03-22 MED ORDER — INSULIN ASPART 100 UNIT/ML IJ SOLN
5.0000 [IU] | Freq: Once | INTRAMUSCULAR | Status: AC
  Administered 2021-03-22: 20:00:00 5 [IU] via SUBCUTANEOUS

## 2021-03-22 MED ORDER — AZITHROMYCIN 250 MG PO TABS
500.0000 mg | ORAL_TABLET | Freq: Every day | ORAL | Status: AC
  Administered 2021-03-22 – 2021-03-23 (×2): 500 mg via ORAL
  Filled 2021-03-22: qty 2

## 2021-03-22 MED ORDER — PREDNISONE 5 MG PO TABS
10.0000 mg | ORAL_TABLET | Freq: Every day | ORAL | Status: DC
  Administered 2021-03-23: 09:00:00 10 mg via ORAL
  Filled 2021-03-22: qty 2

## 2021-03-22 NOTE — Progress Notes (Signed)
Pt continues to decline CPAP QHS.   

## 2021-03-22 NOTE — Plan of Care (Signed)
  Problem: Education: Goal: Knowledge of General Education information will improve Description Including pain rating scale, medication(s)/side effects and non-pharmacologic comfort measures Outcome: Progressing   Problem: Health Behavior/Discharge Planning: Goal: Ability to manage health-related needs will improve Outcome: Progressing   

## 2021-03-22 NOTE — Care Management Important Message (Signed)
Important Message  Patient Details IM Letter given to the Patient/ Name: Chelsea [name] MRN: [id number] Date of Birth: 10-12-1950   Medicare Important Message Given:  Yes     Kerin Salen 03/22/2021, 2:10 PM

## 2021-03-22 NOTE — Progress Notes (Signed)
Inpatient Diabetes Program Recommendations  AACE/ADA: New Consensus Statement on Inpatient Glycemic Control (2015)  Target Ranges:  Prepandial:   less than 140 mg/dL      Peak postprandial:   less than 180 mg/dL (1-2 hours)      Critically ill patients:  140 - 180 mg/dL   Lab Results  Component Value Date   GLUCAP 177 (H) 03/22/2021   HGBA1C 6.5 (H) 03/20/2021    Review of Glycemic Control  Latest Reference Range & Units 03/21/21 11:28 03/21/21 15:30 03/21/21 20:17 03/22/21 00:11 03/22/21 04:15 03/22/21 05:05 03/22/21 07:28  Glucose-Capillary 70 - 99 mg/dL 333 (H) 419 (H) 190 (H) 116 (H) 66 (L) 115 (H) 177 (H)  Diabetes history: DM2 Outpatient Diabetes medications: Levemir 30 units QHS, Novolog 2-12 units TID, metformin 500 BID, Ozempic 1 mg weekly on Tuesdays Current orders for Inpatient glycemic control: Semglee 40 QHS, Novolog 0-20 units q 4 hours, Novolog 16 units tid with meals.  Prednisone 40 mg daily  Inpatient Diabetes Program Recommendations:    Low blood sugars this AM-Consider changing Novolog correction to tid with meals and HS.  May need slight reduction in Novolog meal coverage to 12 units tid with meals. Once steroids are stopped, will need further reduction in insulins.   Thanks,  Adah Perl, RN, BC-ADM Inpatient Diabetes Coordinator Pager [phone]  (8a-5p)

## 2021-03-22 NOTE — Significant Event (Incomplete)
Hypoglycemic Event  CBG: 66   Treatment: {Hypoglycemic Treatment (must also place order and document administration):3049002}  Symptoms: {Hypoglycemic Symptoms:3049003}  Follow-up CBG: Time:*** CBG Result:***  Possible Reasons for Event: Inadequate meal intake and Medication regimen:    Comments/MD notified:protocol followed     Chelsea [name]

## 2021-03-22 NOTE — Progress Notes (Signed)
Mobility Specialist - Progress Note   03/22/21 1146  Oxygen Therapy  SpO2 95 %  O2 Device Nasal Cannula  O2 Flow Rate (L/min) 2 L/min  Mobility  Activity Ambulated in hall;Ambulated to bathroom;Ambulated in room  Level of Assistance Standby assist, set-up cues, supervision of patient - no hands on  Assistive Device Other (Comment) (Hand Held Assist)  Distance Ambulated (ft) 120 ft  Mobility Ambulated with assistance in hallway;Ambulated with assistance in room  Mobility Response Tolerated well  Mobility performed by Mobility specialist  $Mobility charge 1 Mobility   Pre-mobility: 78 HR, 94% SpO2 During mobility: 98 HR, 95% SpO2  Pt very pleasant and agreeable to ambulate. She requested to use bathroom prior to going out into the hallway. Pt stayed connected to 2L of O2 and used RW while ambulating 120 ft in hallway. O2 stats recorded above. 1 seated rest break needed to help with pt's fatigue. After 1 minute of resting, pt ready to return to room. Left in recliner with call bell at side and tray table in front.   Hilda Specialist [facility] Phone: [phone] 03/22/21, 11:50 AM

## 2021-03-22 NOTE — Progress Notes (Signed)
Notified on call provider to make aware BG [phone]. Per orders, gave Lispro 20 units and ordered stat lab confirmation. Patient said she ate dinner around 4pm and has only had an ensure since.

## 2021-03-22 NOTE — Progress Notes (Addendum)
PROGRESS NOTE    Chelsea [name]  FAO:[id number] DOB: 1950-11-01 DOA: 03/19/2021 PCP: [facility]   Brief Narrative:  70 y.o. WF PMHx Anxiety and Bipolar disorder, COPD, chronic respiratory failure on 2 L oxygen, severe OSA (recently diagnosed and not yet on CPAP), CKD stage IIIb, hyperlipidemia, hypothyroidism, diabetes mellitus, chronic diastolic CHF,   Presents to the hospital with shortness of breath.  She was recently hospitalized for GI bleeding and was sent home a couple of days ago.  She admits to shortness of breath, dry cough.  No fevers, chest pain, nausea, vomiting, diarrhea, or abdominal pain.  No further GI bleeding since discharge.   ED Course: Chest x-ray revealed new small left pleural effusion with adjacent atelectasis or infection.  Patient was given Rocephin, azithromycin, Solu-Medrol.   Subjective: 12/23 afebrile overnight A/O x4, states feeling significantly improved.     Assessment & Plan:  Covid vaccination;  Principal Problem:   COPD exacerbation ([place]) Active Problems:   Type 2 diabetes mellitus without complication (HCC)   Bipolar I disorder, single manic episode (HCC)   Hypothyroidism   OSA (obstructive sleep apnea)   Anxiety   HLD (hyperlipidemia)   Chronic respiratory failure with hypoxia (HCC)   CKD (chronic kidney disease) stage 3, GFR 30-59 ml/min (HCC)   Chronic diastolic CHF (congestive heart failure) (HCC)   Moderate malnutrition (HCC)  COPD exacerbation/Left lower lobe HCAP -Breathing tx, Breo, solumedrol -Check MRSA PCR, if positive can consider vancomycin -Continue rocephin, azithromycin -Multimorbid order set for pneumonia and COPD utilized  -DuoNeb  QID -Flutter valve - Incentive spirometry -12/23 decrease Prednisone 10 mg daily  Recent influenza A  -Supportive care    Acute on Chronic respiratory failure with hypoxia -On 2L [place] O2 at baseline -12/21 currently on 3 L O2 -Titrate O2 to maintain SPO2>  92% -12/23 patient's O2 demand now at baseline.  Lung nodule? - 12/21 Per patient and sister 1 physician told them that patient had lung nodule which needed further evaluation, another physician informed them not to worry about a lung nodule. - 12/21 we will review CXR, if patient has lung nodule we will obtain chest CT. -12/22 reviewed CXR lung nodule RULED OUT  Chronic diastolic CHF -Continue bumex  -12/22 Metoprolol 12.5 mg BID -Strict in and out -2.0 L - Daily weight Filed Weights   03/19/21 2014 03/22/21 0500  Weight: 84.5 kg 84.7 kg     OSA -Patient was recently diagnosed with severe OSA, she has not gotten her CPAP mask yet   Recent admission for symptomatic anemia with GI bleed -Outpatient GI follow up in 4-6 weeks  Lab Results  Component Value Date   HGB 8.9 (L) 03/22/2021   HGB 7.5 (L) 03/22/2021   HGB 8.5 (L) 03/21/2021   HGB 8.1 (L) 03/20/2021   HGB 8.6 (L) 03/17/2021  -Stable -12/23 repeat a.m. hemoglobin; consistent with hemolysis of first sample.   - Transfuse for hemoglobin<7     CKD stage 3b(-Baseline Cr 1.2-1.3) Lab Results  Component Value Date   CREATININE 1.28 (H) 03/22/2021   CREATININE 1.36 (H) 03/21/2021   CREATININE 1.35 (H) 03/20/2021   CREATININE 1.45 (H) 03/19/2021   CREATININE 1.19 (H) 03/17/2021  - 12/23 at baseline.   HLD -Continue crestor    Hypothyroidism -Synthroid 125 mcg daily   DM type 2 controlled with hyperglycemia -12/21 hemoglobin A1c= 6.5 -12/21 Semglee 40 units daily -12/22 increase NovoLog 16 units QAC -12/22 increase resistant SSI -12/23 decrease Prednisone  see COPD   Anxiety and Bipolar disorder -Continue paxil, trazodone, geodon, klonopin    Obese (BMI 34.07 kg/m)  Moderate malnutrition -Treatment per nutrition  Goals of care - 12/22 informed that she lives with her sister and brother-in-law.  They are physical therapist retired she therefore desires not to go to an SNF -12/23 consider discharging  patient in next 24 to 48 hours   DVT prophylaxis: Subcu heparin Code Status: Full Family Communication: 12/23 spoke with [name] (sister) discussed plan of care answered all questions Status is: Inpatient    Dispo: The patient is from: Home              Anticipated d/c is to: Home              Anticipated d/c date is: 3 days              Patient currently is not medically stable to d/c.      Consultants:    Procedures/Significant Events:     I have personally reviewed and interpreted all radiology studies and my findings are as above.  VENTILATOR SETTINGS: Nasal cannula 2 L O2, 12/23 SPO2 95%    Cultures 12/20 influenza A/B negative 12/20 SARS coronavirus negative   Antimicrobials: Anti-infectives (From admission, onward)    Start     Ordered Stop   03/20/21 1400  cefTRIAXone (ROCEPHIN) 2 g in sodium chloride 0.9 % 100 mL IVPB        03/19/21 2217 03/25/21 1359   03/20/21 1400  azithromycin (ZITHROMAX) 500 mg in sodium chloride 0.9 % 250 mL IVPB        03/19/21 2217 03/25/21 1359   03/19/21 1430  cefTRIAXone (ROCEPHIN) 1 g in sodium chloride 0.9 % 100 mL IVPB        03/19/21 1415 03/19/21 1515   03/19/21 1430  azithromycin (ZITHROMAX) tablet 500 mg        03/19/21 1415 03/19/21 1446           Devices    LINES / TUBES:      Continuous Infusions:  sodium chloride     cefTRIAXone (ROCEPHIN)  IV 2 g (03/21/21 1352)     Objective: Vitals:   03/22/21 0508 03/22/21 0840 03/22/21 0845 03/22/21 1146  BP: (!) 123/58     Pulse: (!) 57     Resp: 16     Temp: 97.9 F (36.6 C)     TempSrc:      SpO2: 96% 96% 96% 95%  Weight:      Height:        Intake/Output Summary (Last 24 hours) at 03/22/2021 1220 Last data filed at 03/22/2021 3382 Gross per 24 hour  Intake 480 ml  Output 1100 ml  Net -620 ml    Filed Weights   03/19/21 2014 03/22/21 0500  Weight: 84.5 kg 84.7 kg    Examination:  General: A/O x4, positive acute on chronic  respiratory distress Eyes: negative scleral hemorrhage, negative anisocoria, negative icterus ENT: Negative Runny nose, negative gingival bleeding, Neck:  Negative scars, masses, torticollis, lymphadenopathy, JVD Lungs: decreased breath sounds bilaterally, positive expiratory wheezes Cardiovascular: Regular rate and rhythm without murmur gallop or rub normal S1 and S2 Abdomen: negative abdominal pain, nondistended, positive soft, bowel sounds, no rebound, no ascites, no appreciable mass Extremities: No significant cyanosis, clubbing, or edema bilateral lower extremities Skin: Negative rashes, lesions, ulcers Psychiatric:  Negative depression, negative anxiety, negative fatigue, negative mania  Central nervous system:  Cranial  nerves II through XII intact, tongue/uvula midline, all extremities muscle strength 5/5, sensation intact throughout, negative dysarthria, negative expressive aphasia, negative receptive aphasia.  .     Data Reviewed: Care during the described time interval was provided by me .  I have reviewed this patient's available data, including medical history, events of note, physical examination, and all test results as part of my evaluation.   CBC: Recent Labs  Lab 03/16/21 0608 03/17/21 0541 03/20/21 0444 03/21/21 0419 03/22/21 0451  WBC 13.8* 11.2* 7.6 12.7* 9.0  NEUTROABS  --   --   --  10.1* 5.4  HGB 8.6* 8.6* 8.1* 8.5* 7.5*  HCT 27.8* 28.7* 25.7* 26.5* 24.2*  MCV 84.2 85.9 81.1 80.5 82.9  PLT 315 292 370 485* 410*    Basic Metabolic Panel: Recent Labs  Lab 03/17/21 0541 03/19/21 1315 03/19/21 2312 03/20/21 0444 03/21/21 0419 03/22/21 0451  NA 140 135  --  136 138 142  K 4.5 4.1  --  4.1 3.8 4.1  CL 109 104  --  103 103 105  CO2 25 23  --  25 26 27   GLUCOSE 128* 238* 502* 312* 121* 91  BUN 16 21  --  25* 40* 44*  CREATININE 1.19* 1.45*  --  1.35* 1.36* 1.28*  CALCIUM 8.4* 8.4*  --  8.6* 8.9 8.7*  MG  --   --   --   --  2.1 2.0  PHOS  --   --   --    --  3.2 3.6    GFR: Estimated Creatinine Clearance: 42.4 mL/min (A) (by C-G formula based on SCr of 1.28 mg/dL (H)). Liver Function Tests: Recent Labs  Lab 03/16/21 0608 03/17/21 0541 03/21/21 0419 03/22/21 0451  AST 14* 9* 17 15  ALT 11 11 11 15   ALKPHOS 71 69 67 55  BILITOT 0.4 0.4 0.3 0.4  PROT 5.9* 5.6* 6.5 5.5*  ALBUMIN 2.6* 2.5* 2.7* 2.4*    No results for input(s): LIPASE, AMYLASE in the last 168 hours. No results for input(s): AMMONIA in the last 168 hours. Coagulation Profile: No results for input(s): INR, PROTIME in the last 168 hours. Cardiac Enzymes: No results for input(s): CKTOTAL, CKMB, CKMBINDEX, TROPONINI in the last 168 hours. BNP (last 3 results) No results for input(s): PROBNP in the last 8760 hours. HbA1C: Recent Labs    03/20/21 0444  HGBA1C 6.5*    CBG: Recent Labs  Lab 03/21/21 2017 03/22/21 0011 03/22/21 0415 03/22/21 0505 03/22/21 0728  GLUCAP 190* 116* 66* 115* 177*    Lipid Profile: Recent Labs    03/20/21 0444  CHOL 109  HDL 32*  LDLCALC 65  TRIG 61  CHOLHDL 3.4    Thyroid Function Tests: No results for input(s): TSH, T4TOTAL, FREET4, T3FREE, THYROIDAB in the last 72 hours. Anemia Panel: No results for input(s): VITAMINB12, FOLATE, FERRITIN, TIBC, IRON, RETICCTPCT in the last 72 hours. Urine analysis:    Component Value Date/Time   COLORURINE YELLOW 09/27/2020 1846   APPEARANCEUR CLEAR 09/27/2020 1846   LABSPEC 1.005 09/27/2020 1846   PHURINE 6.0 09/27/2020 1846   GLUCOSEU NEGATIVE 09/27/2020 1846   HGBUR NEGATIVE 09/27/2020 1846   BILIRUBINUR NEGATIVE 09/27/2020 1846   KETONESUR NEGATIVE 09/27/2020 1846   PROTEINUR NEGATIVE 09/27/2020 1846   UROBILINOGEN 1.0 12/03/2012 1531   NITRITE NEGATIVE 09/27/2020 1846   LEUKOCYTESUR LARGE (A) 09/27/2020 1846   Sepsis Labs: @LABRCNTIP (procalcitonin:4,lacticidven:4)  ) Recent Results (from the past 240 hour(s))  Culture, blood (  routine x 2)     Status: None    Collection Time: 03/14/21  5:00 PM   Specimen: BLOOD  Result Value Ref Range Status   Specimen Description   Final    BLOOD RIGHT ANTECUBITAL Performed at [facility] [address], [place], [place] [address]    Special Requests   Final    BOTTLES DRAWN AEROBIC AND ANAEROBIC Blood Culture results may not be optimal due to an excessive volume of blood received in culture bottles Performed at [facility] [address], [place], [place] [address]    Culture   Final    NO GROWTH 5 DAYS Performed at [facility], [place] [address] [place], [place] [address]    Report Status 03/19/2021 FINAL  Final  Culture, blood (routine x 2)     Status: None   Collection Time: 03/14/21  5:13 PM   Specimen: BLOOD  Result Value Ref Range Status   Specimen Description   Final    BLOOD BLOOD LEFT FOREARM Performed at [facility] [address], [place], [place] [address]    Special Requests   Final    BOTTLES DRAWN AEROBIC AND ANAEROBIC Blood Culture adequate volume Performed at [facility] [address], [place], [place] [address]    Culture   Final    NO GROWTH 5 DAYS Performed at [facility], [place] [address], [place], [place] [address]    Report Status 03/19/2021 FINAL  Final  Resp Panel by RT-PCR (Flu A&B, Covid) Nasopharyngeal Swab     Status: Abnormal   Collection Time: 03/14/21  5:13 PM   Specimen: Nasopharyngeal Swab; Nasopharyngeal(NP) swabs in vial transport medium  Result Value Ref Range Status   SARS Coronavirus 2 by RT PCR NEGATIVE NEGATIVE Final    Comment: (NOTE) SARS-CoV-2 target nucleic acids are NOT DETECTED.  The SARS-CoV-2 RNA is generally detectable in upper respiratory specimens during the acute phase of infection. The lowest concentration of SARS-CoV-2 viral copies this assay can detect is 138 copies/mL. A negative result does not preclude SARS-Cov-2 infection and should not be  used as the sole basis for treatment or other patient management decisions. A negative result may occur with  improper specimen collection/handling, submission of specimen other than nasopharyngeal swab, presence of viral mutation(s) within the areas targeted by this assay, and inadequate number of viral copies(<138 copies/mL). A negative result must be combined with clinical observations, patient history, and epidemiological information. The expected result is Negative.  Fact Sheet for Patients:  EntrepreneurPulse.com.au  Fact Sheet for Healthcare Providers:  IncredibleEmployment.be  This test is no t yet approved or cleared by the [place] FDA and  has been authorized for detection and/or diagnosis of SARS-CoV-2 by FDA under an Emergency Use Authorization (EUA). This EUA will remain  in effect (meaning this test can be used) for the duration of the COVID-19 declaration under Section 564(b)(1) of the Act, 21 U.S.C.section 360bbb-3(b)(1), unless the authorization is terminated  or revoked sooner.       Influenza A by PCR POSITIVE (A) NEGATIVE Final   Influenza B by PCR NEGATIVE NEGATIVE Final    Comment: (NOTE) The Xpert Xpress SARS-CoV-2/FLU/RSV plus assay is intended as an aid in the diagnosis of influenza from Nasopharyngeal swab specimens and should not be used as a sole basis for treatment. Nasal washings and aspirates are unacceptable for Xpert Xpress SARS-CoV-2/FLU/RSV testing.  Fact Sheet for Patients: EntrepreneurPulse.com.au  Fact Sheet for Healthcare Providers: IncredibleEmployment.be  This test is not yet approved or cleared by the [place] FDA and has been authorized for detection and/or diagnosis of SARS-CoV-2 by FDA under an Emergency Use Authorization (EUA). This EUA will remain in effect (meaning this test can be used) for the duration of the COVID-19 declaration under Section  564(b)(1) of the Act, 21 U.S.C. section 360bbb-3(b)(1), unless the authorization is terminated or revoked.  Performed at [facility], [place] [address], [place], [place] [address]   Resp Panel by RT-PCR (Flu A&B, Covid) Nasopharyngeal Swab     Status: None   Collection Time: 03/19/21  1:15 PM   Specimen: Nasopharyngeal Swab; Nasopharyngeal(NP) swabs in vial transport medium  Result Value Ref Range Status   SARS Coronavirus 2 by RT PCR NEGATIVE NEGATIVE Final    Comment: (NOTE) SARS-CoV-2 target nucleic acids are NOT DETECTED.  The SARS-CoV-2 RNA is generally detectable in upper respiratory specimens during the acute phase of infection. The lowest concentration of SARS-CoV-2 viral copies this assay can detect is 138 copies/mL. A negative result does not preclude SARS-Cov-2 infection and should not be used as the sole basis for treatment or other patient management decisions. A negative result may occur with  improper specimen collection/handling, submission of specimen other than nasopharyngeal swab, presence of viral mutation(s) within the areas targeted by this assay, and inadequate number of viral copies(<138 copies/mL). A negative result must be combined with clinical observations, patient history, and epidemiological information. The expected result is Negative.  Fact Sheet for Patients:  EntrepreneurPulse.com.au  Fact Sheet for Healthcare Providers:  IncredibleEmployment.be  This test is no t yet approved or cleared by the [place] FDA and  has been authorized for detection and/or diagnosis of SARS-CoV-2 by FDA under an Emergency Use Authorization (EUA). This EUA will remain  in effect (meaning this test can be used) for the duration of the COVID-19 declaration under Section 564(b)(1) of the Act, 21 U.S.C.section 360bbb-3(b)(1), unless the authorization is terminated  or revoked sooner.       Influenza A by PCR  NEGATIVE NEGATIVE Final   Influenza B by PCR NEGATIVE NEGATIVE Final    Comment: (NOTE) The Xpert Xpress SARS-CoV-2/FLU/RSV plus assay is intended as an aid in the diagnosis of influenza from Nasopharyngeal swab specimens and should not be used as a sole basis for treatment. Nasal washings and aspirates are unacceptable for Xpert Xpress SARS-CoV-2/FLU/RSV testing.  Fact Sheet for Patients: EntrepreneurPulse.com.au  Fact Sheet for Healthcare Providers: IncredibleEmployment.be  This test is not yet approved or cleared by the [place] FDA and has been authorized for detection and/or diagnosis of SARS-CoV-2 by FDA under an Emergency Use Authorization (EUA). This EUA will remain in effect (meaning this test can be used) for the duration of the COVID-19 declaration under Section 564(b)(1) of the Act, 21 U.S.C. section 360bbb-3(b)(1), unless the authorization is terminated or revoked.  Performed at [facility], [place] [address] [place], [place] [address]   MRSA Next Gen by PCR, Nasal     Status: None   Collection Time: 03/20/21  5:27 AM   Specimen: Nasal Mucosa; Nasal Swab  Result Value Ref Range Status   MRSA by PCR Next Gen NOT DETECTED NOT DETECTED Final    Comment: (NOTE) The GeneXpert MRSA Assay (FDA approved for NASAL specimens only), is one component of a comprehensive MRSA colonization surveillance program. It is not intended to diagnose MRSA infection nor to guide or monitor  treatment for MRSA infections. Test performance is not FDA approved in patients less than 39 years old. Performed at [facility], [place] [address], [place], [place] [address]          Radiology Studies: No results found.      Scheduled Meds:  azithromycin  500 mg Oral Daily   bumetanide  1 mg Oral Daily   cholecalciferol  5,000 Units Oral Daily   clonazePAM  1 mg Oral QHS   ferrous sulfate  325 mg Oral Daily    fluticasone furoate-vilanterol  1 puff Inhalation Daily   heparin  5,000 Units Subcutaneous Q8H   insulin aspart  0-20 Units Subcutaneous Q4H   insulin aspart  16 Units Subcutaneous TID WC   insulin glargine-yfgn  40 Units Subcutaneous QHS   ipratropium-albuterol  3 mL Nebulization BID   levothyroxine  125 mcg Oral Q0600   metoprolol tartrate  12.5 mg Oral BID   montelukast  10 mg Oral Daily   multivitamin with minerals  1 tablet Oral Daily   pantoprazole  80 mg Oral Daily   PARoxetine  40 mg Oral q morning   predniSONE  40 mg Oral Q breakfast   Ensure Max Protein  11 oz Oral BID   rOPINIRole  0.75 mg Oral QHS   rosuvastatin  5 mg Oral Daily   sodium chloride flush  3 mL Intravenous Q12H   traZODone  100 mg Oral QHS   ziprasidone  80 mg Oral QHS   Continuous Infusions:  sodium chloride     cefTRIAXone (ROCEPHIN)  IV 2 g (03/21/21 1352)     LOS: 3 days   The patient is critically ill with multiple organ systems failure and requires high complexity decision making for assessment and support, frequent evaluation and titration of therapies, application of advanced monitoring technologies and extensive interpretation of multiple databases. Critical Care Time devoted to patient care services described in this note  Time spent: 40 minutes     Alannis Hsia, Geraldo Docker, MD [facility]   If 7PM-7AM, please contact night-coverage 03/22/2021, 12:20 PM

## 2021-03-23 DIAGNOSIS — E039 Hypothyroidism, unspecified: Secondary | ICD-10-CM

## 2021-03-23 DIAGNOSIS — J9611 Chronic respiratory failure with hypoxia: Secondary | ICD-10-CM

## 2021-03-23 LAB — COMPREHENSIVE METABOLIC PANEL
ALT: 16 U/L (ref 0–44)
AST: 20 U/L (ref 15–41)
Albumin: 2.5 g/dL — ABNORMAL LOW (ref 3.5–5.0)
Alkaline Phosphatase: 62 U/L (ref 38–126)
Anion gap: 6 (ref 5–15)
BUN: 52 mg/dL — ABNORMAL HIGH (ref 8–23)
CO2: 30 mmol/L (ref 22–32)
Calcium: 8.8 mg/dL — ABNORMAL LOW (ref 8.9–10.3)
Chloride: 104 mmol/L (ref 98–111)
Creatinine, Ser: 1.46 mg/dL — ABNORMAL HIGH (ref 0.44–1.00)
GFR, Estimated: 39 mL/min — ABNORMAL LOW (ref >60)
Glucose, Bld: 85 mg/dL (ref 70–99)
Potassium: 4.1 mmol/L (ref 3.5–5.1)
Sodium: 140 mmol/L (ref 135–145)
Total Bilirubin: 0.4 mg/dL (ref 0.3–1.2)
Total Protein: 5.8 g/dL — ABNORMAL LOW (ref 6.5–8.1)

## 2021-03-23 LAB — CBC WITH DIFFERENTIAL/PLATELET
Abs Immature Granulocytes: 0.18 10*3/uL — ABNORMAL HIGH (ref 0.00–0.07)
Basophils Absolute: 0 10*3/uL (ref 0.0–0.1)
Basophils Relative: 0 %
Eosinophils Absolute: 0 10*3/uL (ref 0.0–0.5)
Eosinophils Relative: 0 %
HCT: 25.9 % — ABNORMAL LOW (ref 36.0–46.0)
Hemoglobin: 7.9 g/dL — ABNORMAL LOW (ref 12.0–15.0)
Immature Granulocytes: 2 %
Lymphocytes Relative: 29 %
Lymphs Abs: 2.8 10*3/uL (ref 0.7–4.0)
MCH: 25.2 pg — ABNORMAL LOW (ref 26.0–34.0)
MCHC: 30.5 g/dL (ref 30.0–36.0)
MCV: 82.7 fL (ref 80.0–100.0)
Monocytes Absolute: 1 10*3/uL (ref 0.1–1.0)
Monocytes Relative: 10 %
Neutro Abs: 5.7 10*3/uL (ref 1.7–7.7)
Neutrophils Relative %: 59 %
Platelets: 486 10*3/uL — ABNORMAL HIGH (ref 150–400)
RBC: 3.13 MIL/uL — ABNORMAL LOW (ref 3.87–5.11)
RDW: 13.2 % (ref 11.5–15.5)
WBC: 9.7 10*3/uL (ref 4.0–10.5)
nRBC: 0 % (ref 0.0–0.2)

## 2021-03-23 LAB — MAGNESIUM: Magnesium: 2.2 mg/dL (ref 1.7–2.4)

## 2021-03-23 LAB — GLUCOSE, CAPILLARY
Glucose-Capillary: 89 mg/dL (ref 70–99)
Glucose-Capillary: 80 mg/dL (ref 70–99)
Glucose-Capillary: 227 mg/dL — ABNORMAL HIGH (ref 70–99)

## 2021-03-23 LAB — PHOSPHORUS: Phosphorus: 4.5 mg/dL (ref 2.5–4.6)

## 2021-03-23 MED ORDER — POLYETHYLENE GLYCOL 3350 17 G PO PACK: 17.0000 g | PACK | Freq: Every day | ORAL | 0 refills | Status: DC | PRN

## 2021-03-23 MED ORDER — POLYETHYLENE GLYCOL 3350 17 G PO PACK: 17.0000 g | PACK | Freq: Every day | ORAL | 0 refills | Status: AC | PRN

## 2021-03-23 MED ORDER — PREDNISONE 10 MG PO TABS: 10.0000 mg | ORAL_TABLET | Freq: Every day | ORAL | 0 refills | Status: AC

## 2021-03-23 MED ORDER — ENSURE MAX PROTEIN PO LIQD: 11.0000 [oz_av] | Freq: Two times a day (BID) | ORAL | 0 refills | Status: AC

## 2021-03-23 MED ORDER — PREDNISONE 10 MG PO TABS: 10.0000 mg | ORAL_TABLET | Freq: Every day | ORAL | 0 refills | Status: DC

## 2021-03-23 MED ORDER — METOPROLOL TARTRATE 25 MG PO TABS: 12.5000 mg | ORAL_TABLET | Freq: Two times a day (BID) | ORAL | 0 refills | Status: DC

## 2021-03-23 MED ORDER — ENSURE MAX PROTEIN PO LIQD: 11.0000 [oz_av] | Freq: Two times a day (BID) | ORAL | 0 refills | Status: DC

## 2021-03-23 MED ORDER — ADULT MULTIVITAMIN W/MINERALS CH: 1.0000 | ORAL_TABLET | Freq: Every day | ORAL | 0 refills | Status: AC

## 2021-03-23 MED ORDER — ONDANSETRON HCL 4 MG PO TABS: 4.0000 mg | ORAL_TABLET | Freq: Four times a day (QID) | ORAL | 0 refills | Status: AC | PRN

## 2021-03-23 MED ORDER — ONDANSETRON HCL 4 MG PO TABS
4.0000 mg | ORAL_TABLET | Freq: Four times a day (QID) | ORAL | 0 refills | Status: DC | PRN
Start: 2021-03-23 — End: 2021-03-23

## 2021-03-23 MED ORDER — ADULT MULTIVITAMIN W/MINERALS CH: 1.0000 | ORAL_TABLET | Freq: Every day | ORAL | 0 refills | Status: DC

## 2021-03-23 NOTE — Evaluation (Signed)
Occupational Therapy Evaluation Patient Details Name: Chelsea [name] MRN: [id number] DOB: December 17, 1950 Today's Date: 03/23/2021   History of Present Illness Patient is a 70 year old female who presented to the ED with SOB. patient was recently hospitalized with GI bleed. patient was found to have COPD exacerbation, left lower lobe pneumonia.  PMH: DM type II, obesity, metabolic syndrome, HTN, COPD, sleep apnea.   Clinical Impression   Patient evaluated by Occupational Therapy with no further acute OT needs identified. All education has been completed and the patient has no further questions. Patient is MI for ADLs with ability to maintain O2 at 2L/min for Ads. Patient was able to participate in functional activity for about 15 mins on 2L/min with no SOB noted. Patient endorses being at baseline at this time.  See below for any follow-up Occupational Therapy or equipment needs. OT is signing off. Thank you for this referral.       Recommendations for follow up therapy are one component of a multi-disciplinary discharge planning process, led by the attending physician.  Recommendations may be updated based on patient status, additional functional criteria and insurance authorization.   Follow Up Recommendations  No OT follow up    Assistance Recommended at Discharge Intermittent Supervision/Assistance  Functional Status Assessment  Patient has not had a recent decline in their functional status  Equipment Recommendations  None recommended by OT    Recommendations for Other Services       Precautions / Restrictions Precautions Precaution Comments: monitor O2 2L/min at home Restrictions Weight Bearing Restrictions: No      Mobility Bed Mobility Overal bed mobility: Modified Independent                  Transfers Overall transfer level: Modified independent Equipment used: None                      Balance Overall balance assessment: No apparent balance deficits  (not formally assessed)                                         ADL either performed or assessed with clinical judgement   ADL Overall ADL's : Modified independent                                       General ADL Comments: patient was MI for ADLs with patietn able to complete LB dressing tasks, functional mobility, transfers and grooming tasks with MI. patient endorsed being at baseline at this time. patient reported that she has 2L/min at home chronically. patient also reported that family is present with her at all times as well.     Vision Patient Visual Report: No change from baseline       Perception     Praxis      Pertinent Vitals/Pain Pain Assessment: No/denies pain     Hand Dominance Right   Extremity/Trunk Assessment Upper Extremity Assessment Upper Extremity Assessment: Overall WFL for tasks assessed   Lower Extremity Assessment Lower Extremity Assessment: Defer to PT evaluation   Cervical / Trunk Assessment Cervical / Trunk Assessment: Normal   Communication Communication Communication: No difficulties   Cognition Arousal/Alertness: Awake/alert Behavior During Therapy: WFL for tasks assessed/performed Overall Cognitive Status: Within Functional Limits for tasks assessed  General Comments       Exercises     Shoulder Instructions      Home Living Family/patient expects to be discharged to:: Private residence Living Arrangements: Other relatives Available Help at Discharge: Family Type of Home: House Home Access: Stairs to enter Technical brewer of Steps: 4 Entrance Stairs-Rails:  (one side) Home Layout: One level     Bathroom Shower/Tub: Walk-in shower         Home Equipment: Conservation officer, nature (2 wheels);Cane - single point;Shower seat          Prior Functioning/Environment Prior Level of Function : Independent/Modified Independent                ADLs Comments: completing with no AE/AD        OT Problem List:        OT Treatment/Interventions:      OT Goals(Current goals can be found in the care plan section) Acute Rehab OT Goals OT Goal Formulation: All assessment and education complete, DC therapy  OT Frequency:     Barriers to D/C:            Co-evaluation PT/OT/SLP Co-Evaluation/Treatment: Yes Reason for Co-Treatment: To address functional/ADL transfers PT goals addressed during session: Mobility/safety with mobility OT goals addressed during session: ADL's and self-care      AM-PAC OT "6 Clicks" Daily Activity     Outcome Measure Help from another person eating meals?: None Help from another person taking care of personal grooming?: None Help from another person toileting, which includes using toliet, bedpan, or urinal?: None Help from another person bathing (including washing, rinsing, drying)?: None Help from another person to put on and taking off regular upper body clothing?: None Help from another person to put on and taking off regular lower body clothing?: None 6 Click Score: 24   End of Session Equipment Utilized During Treatment: Gait belt Nurse Communication: Mobility status  Activity Tolerance: Patient tolerated treatment well Patient left: in chair;with call bell/phone within reach  OT Visit Diagnosis: Muscle weakness (generalized) (M62.81)                Time: 4827-0786 OT Time Calculation (min): 18 min Charges:  OT General Charges $OT Visit: 1 Visit OT Evaluation $OT Eval Low Complexity: 1 Low  [name], MS Acute Rehabilitation Department Office# [phone] Pager# [phone]   [name] 03/23/2021, 3:50 PM

## 2021-03-23 NOTE — Evaluation (Signed)
Physical Therapy Evaluation Patient Details Name: Chelsea [name] MRN: [id number] DOB: 1950-09-06 Today's Date: 03/23/2021  History of Present Illness  Patient is a 70 year old female who presented to the ED with SOB. patient was recently hospitalized with GI bleed. patient was found to have COPD exacerbation, left lower lobe pneumonia.  PMH: DM type II, obesity, metabolic syndrome, HTN, COPD, sleep apnea.  Clinical Impression  Pt admitted as above and currently demonstrating performance of all basic mobility tasks including bed mobility, transfers and ambulation (including back stepping and side stepping) at MOD I to IND level with good safety awareness.  Pt maintained SaO2 at 95% or higher on 2L of O2 throughout session.  Pt eager for dc home.     Recommendations for follow up therapy are one component of a multi-disciplinary discharge planning process, led by the attending physician.  Recommendations may be updated based on patient status, additional functional criteria and insurance authorization.  Follow Up Recommendations No PT follow up    Assistance Recommended at Discharge None  Functional Status Assessment Patient has had a recent decline in their functional status and demonstrates the ability to make significant improvements in function in a reasonable and predictable amount of time.  Equipment Recommendations  None recommended by PT    Recommendations for Other Services       Precautions / Restrictions Precautions Precaution Comments: monitor O2 2L/min at home Restrictions Weight Bearing Restrictions: No      Mobility  Bed Mobility Overal bed mobility: Modified Independent             General bed mobility comments: no physical assist    Transfers Overall transfer level: Modified independent Equipment used: None               General transfer comment: Good stability on rising; no physical assist    Ambulation/Gait Ambulation/Gait assistance:  Supervision;Independent Gait Distance (Feet): 400 Feet Assistive device: None Gait Pattern/deviations: Step-through pattern;Decreased step length - right;Decreased step length - left;Wide base of support Gait velocity: decr     General Gait Details: slightly widened BOS with very mild instability and no LOB  Stairs            Wheelchair Mobility    Modified Rankin (Stroke Patients Only)       Balance Overall balance assessment: No apparent balance deficits (not formally assessed)                                           Pertinent Vitals/Pain Pain Assessment: No/denies pain    Home Living Family/patient expects to be discharged to:: Private residence Living Arrangements: Other relatives Available Help at Discharge: Family Type of Home: House Home Access: Stairs to enter Entrance Stairs-Rails: Right Entrance Stairs-Number of Steps: 4   Home Layout: One level Home Equipment: Conservation officer, nature (2 wheels);Cane - single point;Shower seat      Prior Function Prior Level of Function : Independent/Modified Independent               ADLs Comments: completing with no AE/AD     Hand Dominance   Dominant Hand: Right    Extremity/Trunk Assessment   Upper Extremity Assessment Upper Extremity Assessment: Overall WFL for tasks assessed    Lower Extremity Assessment Lower Extremity Assessment: Overall WFL for tasks assessed    Cervical / Trunk Assessment Cervical / Trunk Assessment: Normal  Communication   Communication: No difficulties  Cognition Arousal/Alertness: Awake/alert Behavior During Therapy: WFL for tasks assessed/performed Overall Cognitive Status: Within Functional Limits for tasks assessed                                          General Comments      Exercises     Assessment/Plan    PT Assessment Patient does not need any further PT services  PT Problem List         PT Treatment Interventions       PT Goals (Current goals can be found in the Care Plan section)  Acute Rehab PT Goals Patient Stated Goal: HOME PT Goal Formulation: All assessment and education complete, DC therapy Time For Goal Achievement: 03/23/21    Frequency     Barriers to discharge        Co-evaluation PT/OT/SLP Co-Evaluation/Treatment: Yes Reason for Co-Treatment: To address functional/ADL transfers PT goals addressed during session: Mobility/safety with mobility OT goals addressed during session: ADL's and self-care       AM-PAC PT "6 Clicks" Mobility  Outcome Measure Help needed turning from your back to your side while in a flat bed without using bedrails?: None Help needed moving from lying on your back to sitting on the side of a flat bed without using bedrails?: None Help needed moving to and from a bed to a chair (including a wheelchair)?: None Help needed standing up from a chair using your arms (e.g., wheelchair or bedside chair)?: None Help needed to walk in hospital room?: None Help needed climbing 3-5 steps with a railing? : A Little 6 Click Score: 23    End of Session Equipment Utilized During Treatment: Gait belt;Oxygen Activity Tolerance: Patient tolerated treatment well Patient left: in chair;with call bell/phone within reach;with chair alarm set Nurse Communication: Mobility status PT Visit Diagnosis: Difficulty in walking, not elsewhere classified (R26.2)    Time: 2778-2423 PT Time Calculation (min) (ACUTE ONLY): 15 min   Charges:   PT Evaluation $PT Eval Low Complexity: [facility] Services Pager [phone] Office [phone]   Paislea Hatton 03/23/2021, 3:56 PM

## 2021-03-23 NOTE — Progress Notes (Signed)
SATURATION QUALIFICATIONS: (This note is used to comply with regulatory documentation for home oxygen)  Patient Saturations on Room Air at Rest = 90%  Patient Saturations on Room Air while Ambulating = 85%  Patient Saturations on 2 Liters of oxygen while Ambulating = 93%  Please briefly explain why patient needs home oxygen: Chronic respiratory failure/COPD

## 2021-03-24 NOTE — Discharge Summary (Addendum)
Physician Discharge Summary  REINA WILTON JJH:[id number] DOB: April 03, 1950 DOA: 03/19/2021  PCP: [facility]  Admit date: 03/19/2021 Discharge date: 03/23/2021  Admitted From: Home Disposition:  Home with no PT/OT Follow up  Recommendations for Outpatient Follow-up:  Follow up with PCP in 1-2 weeks Repeat CXR in 3-6 weeks Follow-up for pulmonary nodule in outpatient setting Follow up as an outpatient with Pulmonary  Please obtain CMP/CBC, Mag, Phos in one week Please follow up on the following pending results:  Home Health: No Equipment/Devices: None    Discharge Condition: Stable  CODE STATUS: FULL CODE   Diet recommendation: Heart Healthy Diet   Brief/Interim Summary: The patient is a 70 year old obese Caucasian female with past medical history significant for but limited to anxiety and bipolar disorder, COPD with chronic respiratory failure on 2 L supplemental oxygen, severe OSA with recently diagnosed and not yet on CPAP, CKD stage IIIb, hyperlipidemia, hypothyroidism, diabetes mellitus type 2, chronic diastolic CHF as well as other comorbidities who presented to the hospital shortness of breath.  She was recently hospitalized for GI bleeding and sent home on couple days prior to representing.  She admitted shortness of breath and dry cough.  She had no further breathing since discharge.  Chest x-ray on presentation this time revealed a new left pleural effusion with adjacent atelectasis or infection.  She is given Rocephin, azithromycin and Solu-Medrol and admitted for a COPD exacerbation with a left lower lobe HCAP.  She improved with antibiotic treatment and breathing treatments as well as Solu-Medrol.  She is also found to have recent influenza A which we will continue supportive care for.  Her respiratory status was close to her baseline and she was felt to be stable to be discharged home.  PT OT recommended no follow-up.  Almyra Free to follow-up with her PCP as well as  GI doctor and pulmonologist in outpatient setting.  Discharge Diagnoses:  Principal Problem:   COPD exacerbation ([place]) Active Problems:   Type 2 diabetes mellitus without complication (HCC)   Bipolar I disorder, single manic episode (HCC)   Hypothyroidism   OSA (obstructive sleep apnea)   Anxiety   HLD (hyperlipidemia)   Chronic respiratory failure with hypoxia (HCC)   CKD (chronic kidney disease) stage 3, GFR 30-59 ml/min (HCC)   Chronic diastolic CHF (congestive heart failure) (HCC)   Moderate malnutrition (HCC)  COPD exacerbation/Left lower lobe HCAP -Breathing tx, Breo, solumedrol -Check MRSA PCR, if positive can consider vancomycin -Continue rocephin, azithromycin and she completed 5-day treatment course -Multimorbid order set for pneumonia and COPD utilized  -DuoNeb  QID -Flutter valve - Incentive spirometry -12/23 decrease Prednisone 10 mg daily for 2 more days and will be discharged -Repeat chest x-ray in 3 to 6 weeks   Recent influenza A  -Supportive care    Acute on Chronic respiratory failure with hypoxia -On 2L [place] O2 at baseline -12/21 currently on 3 L O2 -Titrate O2 to maintain SPO2> 92% -12/23 patient's O2 demand now at baseline.   Lung nodule? - 12/21 Per patient and sister 1 physician told them that patient had lung nodule which needed further evaluation, another physician informed them not to worry about a lung nodule. - 12/21 we will review CXR, if patient has lung nodule we will obtain chest CT. -12/20 reviewed CXR and mentioned no evidence of Lung nodule but showed "Lateral view degraded by patient arm position. Midline trachea. Normal heart size. Atherosclerosis in the transverse aorta. Tiny left pleural effusion, new.  No pneumothorax. The Chin overlies the apices minimally. New retrocardiac left lower lobe airspace disease.   Chronic diastolic CHF -Continue bumex  -12/22 Metoprolol 12.5 mg BID and will continue at discharge -Strict in and out -2.710  L - Daily weight     Filed Weights    03/19/21 2014 03/22/21 0500  Weight: 84.5 kg 84.7 kg      OSA -Patient was recently diagnosed with severe OSA, she has not gotten her CPAP mask yet -Follow-up with pulmonary outpatient   Recent admission for symptomatic anemia with GI bleed -Outpatient GI follow up in 4-6 weeks  Recent Labs       Lab Results  Component Value Date    HGB 8.9 (L) 03/22/2021    HGB 7.5 (L) 03/22/2021    HGB 8.5 (L) 03/21/2021    HGB 8.1 (L) 03/20/2021    HGB 8.6 (L) 03/17/2021    -Stable -12/23 repeat a.m. hemoglobin; consistent with hemolysis of first sample.   -Hemoglobin/hematocrit at the time of discharge was stable at 7.9/25.9 with no evidence of any bleeding - Transfuse for hemoglobin<7 -Repeat CBC within 1 week     CKD stage 3b(-Baseline Cr 1.2-1.3) Recent Labs       Lab Results  Component Value Date    CREATININE 1.28 (H) 03/22/2021    CREATININE 1.36 (H) 03/21/2021    CREATININE 1.35 (H) 03/20/2021    CREATININE 1.45 (H) 03/19/2021    CREATININE 1.19 (H) 03/17/2021    - 12/23 at baseline and BUN/creatinine slightly bumped and went from 44/1.28 is now 52/1.46 and within her normal limits -Follow-up in outpatient setting and avoid further nephrotoxic medications, contrast dyes, hypotension renally dose medications   HLD -Continue crestor    Hypothyroidism -Synthroid 125 mcg daily   DM type 2 controlled with hyperglycemia -12/21 hemoglobin A1c= 6.5 -12/21 Semglee 40 units daily -12/22 increase NovoLog 16 units QAC -12/22 increase resistant SSI -12/23 decrease Prednisone see COPD -CBGs ranging from 80-227   Anxiety and Bipolar disorder -Continue paxil, trazodone, geodon, klonopin    Obesity -Complicates overall prognosis and care -Estimated body mass index is 34.15 kg/m as calculated from the following:   Height as of this encounter: 5\' 2"  (1.575 m).   Weight as of this encounter: 84.7 kg.  -Weight Loss and Dietary Counseling  given   Moderate malnutrition -Treatment per nutrition   Thrombocytosis -Likely reactive and patient's platelet count went from 410 and is now 486 -continue monitor and trend and repeat CBC within outpatient setting  Discharge Instructions  Discharge Instructions     Call MD for:  difficulty breathing, headache or visual disturbances   Complete by: As directed    Call MD for:  extreme fatigue   Complete by: As directed    Call MD for:  hives   Complete by: As directed    Call MD for:  persistant dizziness or light-headedness   Complete by: As directed    Call MD for:  persistant nausea and vomiting   Complete by: As directed    Call MD for:  redness, tenderness, or signs of infection (pain, swelling, redness, odor or green/yellow discharge around incision site)   Complete by: As directed    Call MD for:  severe uncontrolled pain   Complete by: As directed    Call MD for:  temperature >100.4   Complete by: As directed    Diet - low sodium heart healthy   Complete by: As directed  Diet Carb Modified   Complete by: As directed    Discharge instructions   Complete by: As directed    You were cared for by a hospitalist during your hospital stay. If you have any questions about your discharge medications or the care you received while you were in the hospital after you are discharged, you can call the unit and ask to speak with the hospitalist on call if the hospitalist that took care of you is not available. Once you are discharged, your primary care physician will handle any further medical issues. Please note that NO REFILLS for any discharge medications will be authorized once you are discharged, as it is imperative that you return to your primary care physician (or establish a relationship with a primary care physician if you do not have one) for your aftercare needs so that they can reassess your need for medications and monitor your lab values.  Follow up with PCP and  Pulmonary within 1-2 weeks. Take all medications as prescribed. If symptoms change or worsen please return to the ED for evaluation   Discharge wound care:   Complete by: As directed    Frequent turning   Increase activity slowly   Complete by: As directed       Allergies as of 03/23/2021       Reactions   Darvocet [propoxyphene N-acetaminophen] Anaphylaxis   Can take plain tylenol   Fluoxetine Other (See Comments)   Hallucinations    Penicillins Rash   REACTION: rash   Promethazine Hives        Medication List     STOP taking these medications    oseltamivir 30 MG capsule Commonly known as: TAMIFLU       TAKE these medications    acetaminophen 325 MG tablet Commonly known as: TYLENOL Take 650 mg by mouth every 6 (six) hours as needed for moderate pain.   benzonatate 200 MG capsule Commonly known as: TESSALON Take 1 capsule (200 mg total) by mouth 3 (three) times daily as needed for cough.   bumetanide 1 MG tablet Commonly known as: BUMEX Take 1 tablet (1 mg total) by mouth daily.   chlorpheniramine-HYDROcodone 10-8 MG/5ML Suer Commonly known as: TUSSIONEX Take 5 mLs by mouth every 12 (twelve) hours as needed for cough. cough   clonazePAM 1 MG tablet Commonly known as: KLONOPIN Take 1 tablet (1 mg total) by mouth at bedtime.   diclofenac sodium 1 % Gel Commonly known as: VOLTAREN Apply 2 g topically 4 (four) times daily as needed (pain).   Ensure Max Protein Liqd Take 330 mLs (11 oz total) by mouth 2 (two) times daily.   FeroSul 325 (65 FE) MG tablet Generic drug: ferrous sulfate Take 325 mg by mouth daily.   fluticasone furoate-vilanterol 200-25 MCG/INH Aepb Commonly known as: BREO ELLIPTA Inhale 1 puff into the lungs every morning.   Levemir 100 UNIT/ML injection Generic drug: insulin detemir Inject 30 Units into the skin at bedtime.   levothyroxine 125 MCG tablet Commonly known as: SYNTHROID Take 125 mcg by mouth daily before  breakfast.   metFORMIN 500 MG tablet Commonly known as: GLUCOPHAGE Take 500 mg by mouth 2 (two) times daily with a meal.   metoprolol tartrate 25 MG tablet Commonly known as: LOPRESSOR Take 0.5 tablets (12.5 mg total) by mouth 2 (two) times daily.   montelukast 10 MG tablet Commonly known as: SINGULAIR Take 10 mg by mouth daily.   multivitamin with minerals Tabs tablet Take 1  tablet by mouth daily.   NovoLOG FlexPen 100 UNIT/ML FlexPen Generic drug: insulin aspart Inject 2-12 Units into the skin 3 (three) times daily with meals. Sliding scale   nystatin ointment Commonly known as: MYCOSTATIN Apply 1 application topically 2 (two) times daily as needed (rash).   omeprazole 40 MG capsule Commonly known as: PRILOSEC Take 40 mg by mouth daily.   ondansetron 4 MG tablet Commonly known as: ZOFRAN Take 1 tablet (4 mg total) by mouth every 6 (six) hours as needed for nausea.   Ozempic (1 MG/DOSE) 4 MG/3ML Sopn Generic drug: Semaglutide (1 MG/DOSE) Inject 1 mg into the skin once a week. tuesdays   PARoxetine 40 MG tablet Commonly known as: PAXIL Take 1 tablet (40 mg total) by mouth every morning.   polyethylene glycol 17 g packet Commonly known as: MIRALAX / GLYCOLAX Take 17 g by mouth daily as needed for mild constipation.   predniSONE 10 MG tablet Commonly known as: DELTASONE Take 1 tablet (10 mg total) by mouth daily with breakfast for 2 days.   rOPINIRole 0.25 MG tablet Commonly known as: REQUIP TAKE 3 TABLETS BY MOUTH AT BEDTIME   rosuvastatin 5 MG tablet Commonly known as: CRESTOR Take 5 mg by mouth daily.   traZODone 100 MG tablet Commonly known as: DESYREL Take 1 tablet (100 mg total) by mouth at bedtime.   TRUEplus Insulin Syringe 29G X 1/2" 1 ML Misc Generic drug: INSULIN SYRINGE 1CC/29G 1 each by Other route daily.   Ventolin HFA 108 (90 Base) MCG/ACT inhaler Generic drug: albuterol Inhale 1-2 puffs into the lungs every 4 (four) hours as needed for  wheezing or shortness of breath.   albuterol (2.5 MG/3ML) 0.083% nebulizer solution Commonly known as: PROVENTIL Take 2.5 mg by nebulization every 6 (six) hours as needed for wheezing or shortness of breath.   Vitamin D3 125 MCG (5000 UT) Tabs Take 5,000 Units by mouth daily.   ziprasidone 80 MG capsule Commonly known as: GEODON Take 1 capsule (80 mg total) by mouth at bedtime.               Discharge Care Instructions  (From admission, onward)           Start     Ordered   03/23/21 0000  Discharge wound care:       Comments: Frequent turning   03/23/21 1343            Allergies  Allergen Reactions   Darvocet [Propoxyphene N-Acetaminophen] Anaphylaxis    Can take plain tylenol   Fluoxetine Other (See Comments)    Hallucinations    Penicillins Rash    REACTION: rash   Promethazine Hives    Consultations: None  Procedures/Studies: DG Chest 2 View  Result Date: 03/19/2021 CLINICAL DATA:  Shortness of breath EXAM: CHEST - 2 VIEW COMPARISON:  03/14/2021 FINDINGS: Lateral view degraded by patient arm position. Midline trachea. Normal heart size. Atherosclerosis in the transverse aorta. Tiny left pleural effusion, new. No pneumothorax. The Chin overlies the apices minimally. New retrocardiac left lower lobe airspace disease. IMPRESSION: New small left pleural effusion with adjacent atelectasis or infection. Aortic Atherosclerosis (ICD10-I70.0). Electronically Signed   By: Abigail Miyamoto M.D.   On: 03/19/2021 13:39   CT ABDOMEN PELVIS W CONTRAST  Result Date: 03/15/2021 CLINICAL DATA:  Anemia.  GI blood loss. EXAM: CT ABDOMEN AND PELVIS WITH CONTRAST TECHNIQUE: Multidetector CT imaging of the abdomen and pelvis was performed using the standard protocol following bolus administration  of intravenous contrast. CONTRAST:  34mL OMNIPAQUE IOHEXOL 350 MG/ML SOLN COMPARISON:  09/19/2013 FINDINGS: Lower chest: Dependent atelectasis noted in both lung bases. Hepatobiliary:  Small area of low attenuation in the anterior liver, adjacent to the falciform ligament, is in a characteristic location for focal fatty deposition. No suspicious focal abnormality within the liver parenchyma. Trace intrahepatic biliary duct prominence without extrahepatic biliary duct dilatation. Common bile duct is 6 mm diameter in the head of the pancreas. Gallbladder surgically absent. Pancreas: No focal mass lesion. No dilatation of the main duct. No intraparenchymal cyst. No peripancreatic edema. Spleen: No splenomegaly. No focal mass lesion. Adrenals/Urinary Tract: No adrenal nodule or mass. Tiny hypoattenuating lesions in each kidney are too small to characterize but likely benign cysts. No evidence for hydroureter. The urinary bladder appears normal for the degree of distention. Stomach/Bowel: Stomach is unremarkable. No gastric wall thickening. No evidence of outlet obstruction. Duodenum is normally positioned as is the ligament of Treitz. No small bowel wall thickening. No small bowel dilatation. The terminal ileum is normal. The appendix is normal. No gross colonic mass. No colonic wall thickening. Vascular/Lymphatic: There is moderate atherosclerotic calcification of the abdominal aorta without aneurysm. There is no gastrohepatic or hepatoduodenal ligament lymphadenopathy. No retroperitoneal or mesenteric lymphadenopathy. No pelvic sidewall lymphadenopathy. Reproductive: The uterus is unremarkable.  There is no adnexal mass. Other: No intraperitoneal free fluid. Musculoskeletal: No worrisome lytic or sclerotic osseous abnormality. IMPRESSION: 1. No acute findings in the abdomen or pelvis. Specifically, no findings to explain the patient's history of anemia and GI bleeding. 2. Aortic Atherosclerosis (ICD10-I70.0). Electronically Signed   By: Misty Stanley M.D.   On: 03/15/2021 14:12   DG Chest Port 1 View  Result Date: 03/14/2021 CLINICAL DATA:  Shortness of breath EXAM: PORTABLE CHEST 1 VIEW  COMPARISON:  None. FINDINGS: The heart size and mediastinal contours are within normal limits. Both lungs are clear. No pleural effusion. The visualized skeletal structures are unremarkable. IMPRESSION: No active disease. Electronically Signed   By: Macy Mis M.D.   On: 03/14/2021 16:40     Subjective: Seen and examined at bedside and felt much better.  Ambulated in desaturate on 2 L but was back on her home O2.  Denies any chest pain or shortness of breath.  Feels much better and close to her baseline.  No other concerns or complaints at this time.  Discharge Exam: Vitals:   03/23/21 0904 03/23/21 1312  BP: (!) 111/42 (!) 147/58  Pulse: 69 70  Resp:    Temp:  98.2 F (36.8 C)  SpO2:  94%   Vitals:   03/23/21 0736 03/23/21 0737 03/23/21 0904 03/23/21 1312  BP:   (!) 111/42 (!) 147/58  Pulse: 60  69 70  Resp: 17     Temp:    98.2 F (36.8 C)  TempSrc:    Oral  SpO2: 98% 98%  94%  Weight:      Height:       General: Pt is alert, awake, not in acute distress Cardiovascular: RRR, S1/S2 +, no rubs, no gallops Respiratory: Diminished bilaterally with slight wheezing, no rhonchi; unlabored breathing and wearing 2 L of supplemental oxygen via nasal cannula Abdominal: Soft, NT, distended secondary body habitus, bowel sounds + Extremities: Trace edema, no cyanosis  The results of significant diagnostics from this hospitalization (including imaging, microbiology, ancillary and laboratory) are listed below for reference.    Microbiology: Recent Results (from the past 240 hour(s))  Resp Panel by RT-PCR (  Flu A&B, Covid) Nasopharyngeal Swab     Status: None   Collection Time: 03/19/21  1:15 PM   Specimen: Nasopharyngeal Swab; Nasopharyngeal(NP) swabs in vial transport medium  Result Value Ref Range Status   SARS Coronavirus 2 by RT PCR NEGATIVE NEGATIVE Final    Comment: (NOTE) SARS-CoV-2 target nucleic acids are NOT DETECTED.  The SARS-CoV-2 RNA is generally detectable in upper  respiratory specimens during the acute phase of infection. The lowest concentration of SARS-CoV-2 viral copies this assay can detect is 138 copies/mL. A negative result does not preclude SARS-Cov-2 infection and should not be used as the sole basis for treatment or other patient management decisions. A negative result may occur with  improper specimen collection/handling, submission of specimen other than nasopharyngeal swab, presence of viral mutation(s) within the areas targeted by this assay, and inadequate number of viral copies(<138 copies/mL). A negative result must be combined with clinical observations, patient history, and epidemiological information. The expected result is Negative.  Fact Sheet for Patients:  EntrepreneurPulse.com.au  Fact Sheet for Healthcare Providers:  IncredibleEmployment.be  This test is no t yet approved or cleared by the [place] FDA and  has been authorized for detection and/or diagnosis of SARS-CoV-2 by FDA under an Emergency Use Authorization (EUA). This EUA will remain  in effect (meaning this test can be used) for the duration of the COVID-19 declaration under Section 564(b)(1) of the Act, 21 U.S.C.section 360bbb-3(b)(1), unless the authorization is terminated  or revoked sooner.       Influenza A by PCR NEGATIVE NEGATIVE Final   Influenza B by PCR NEGATIVE NEGATIVE Final    Comment: (NOTE) The Xpert Xpress SARS-CoV-2/FLU/RSV plus assay is intended as an aid in the diagnosis of influenza from Nasopharyngeal swab specimens and should not be used as a sole basis for treatment. Nasal washings and aspirates are unacceptable for Xpert Xpress SARS-CoV-2/FLU/RSV testing.  Fact Sheet for Patients: EntrepreneurPulse.com.au  Fact Sheet for Healthcare Providers: IncredibleEmployment.be  This test is not yet approved or cleared by the [place] FDA and has been  authorized for detection and/or diagnosis of SARS-CoV-2 by FDA under an Emergency Use Authorization (EUA). This EUA will remain in effect (meaning this test can be used) for the duration of the COVID-19 declaration under Section 564(b)(1) of the Act, 21 U.S.C. section 360bbb-3(b)(1), unless the authorization is terminated or revoked.  Performed at [facility] [place] [address] [place], [place] [address]   MRSA Next Gen by PCR, Nasal     Status: None   Collection Time: 03/20/21  5:27 AM   Specimen: Nasal Mucosa; Nasal Swab  Result Value Ref Range Status   MRSA by PCR Next Gen NOT DETECTED NOT DETECTED Final    Comment: (NOTE) The GeneXpert MRSA Assay (FDA approved for NASAL specimens only), is one component of a comprehensive MRSA colonization surveillance program. It is not intended to diagnose MRSA infection nor to guide or monitor treatment for MRSA infections. Test performance is not FDA approved in patients less than 1 years old. Performed at [facility], [place] [address] [place], [place] [address]     Labs: BNP (last 3 results) Recent Labs    03/14/21 1700 03/19/21 1315  BNP 34.2 10.2   Basic Metabolic Panel: Recent Labs  Lab 03/19/21 1315 03/19/21 2312 03/20/21 0444 03/21/21 0419 03/22/21 0451 03/22/21 2057 03/23/21 0428  NA 135  --  136 138 142  --  140  K 4.1  --  4.1 3.8 4.1  --  4.1  CL 104  --  103 103 105  --  104  CO2 23  --  25 26 27   --  30  GLUCOSE 238*   < > 312* 121* 91 475* 85  BUN 21  --  25* 40* 44*  --  52*  CREATININE 1.45*  --  1.35* 1.36* 1.28*  --  1.46*  CALCIUM 8.4*  --  8.6* 8.9 8.7*  --  8.8*  MG  --   --   --  2.1 2.0  --  2.2  PHOS  --   --   --  3.2 3.6  --  4.5   < > = values in this interval not displayed.   Liver Function Tests: Recent Labs  Lab 03/21/21 0419 03/22/21 0451 03/23/21 0428  AST 17 15 20   ALT 11 15 16   ALKPHOS 67 55 62  BILITOT 0.3 0.4 0.4  PROT 6.5 5.5* 5.8*   ALBUMIN 2.7* 2.4* 2.5*   No results for input(s): LIPASE, AMYLASE in the last 168 hours. No results for input(s): AMMONIA in the last 168 hours. CBC: Recent Labs  Lab 03/20/21 0444 03/21/21 0419 03/22/21 0451 03/22/21 1236 03/23/21 0428  WBC 7.6 12.7* 9.0  --  9.7  NEUTROABS  --  10.1* 5.4  --  5.7  HGB 8.1* 8.5* 7.5* 8.9* 7.9*  HCT 25.7* 26.5* 24.2* 28.6* 25.9*  MCV 81.1 80.5 82.9  --  82.7  PLT 370 485* 410*  --  486*   Cardiac Enzymes: No results for input(s): CKTOTAL, CKMB, CKMBINDEX, TROPONINI in the last 168 hours. BNP: Invalid input(s): POCBNP CBG: Recent Labs  Lab 03/22/21 1954 03/22/21 2343 03/23/21 0350 03/23/21 0716 03/23/21 1127  GLUCAP 436* 234* 89 80 227*   D-Dimer No results for input(s): DDIMER in the last 72 hours. Hgb A1c No results for input(s): HGBA1C in the last 72 hours. Lipid Profile No results for input(s): CHOL, HDL, LDLCALC, TRIG, CHOLHDL, LDLDIRECT in the last 72 hours. Thyroid function studies No results for input(s): TSH, T4TOTAL, T3FREE, THYROIDAB in the last 72 hours.  Invalid input(s): FREET3 Anemia work up No results for input(s): VITAMINB12, FOLATE, FERRITIN, TIBC, IRON, RETICCTPCT in the last 72 hours. Urinalysis    Component Value Date/Time   COLORURINE YELLOW 09/27/2020 1846   APPEARANCEUR CLEAR 09/27/2020 1846   LABSPEC 1.005 09/27/2020 1846   PHURINE 6.0 09/27/2020 1846   GLUCOSEU NEGATIVE 09/27/2020 1846   HGBUR NEGATIVE 09/27/2020 1846   BILIRUBINUR NEGATIVE 09/27/2020 1846   KETONESUR NEGATIVE 09/27/2020 1846   PROTEINUR NEGATIVE 09/27/2020 1846   UROBILINOGEN 1.0 12/03/2012 1531   NITRITE NEGATIVE 09/27/2020 1846   LEUKOCYTESUR LARGE (A) 09/27/2020 1846   Sepsis Labs Invalid input(s): PROCALCITONIN,  WBC,  LACTICIDVEN Microbiology Recent Results (from the past 240 hour(s))  Resp Panel by RT-PCR (Flu A&B, Covid) Nasopharyngeal Swab     Status: None   Collection Time: 03/19/21  1:15 PM   Specimen:  Nasopharyngeal Swab; Nasopharyngeal(NP) swabs in vial transport medium  Result Value Ref Range Status   SARS Coronavirus 2 by RT PCR NEGATIVE NEGATIVE Final    Comment: (NOTE) SARS-CoV-2 target nucleic acids are NOT DETECTED.  The SARS-CoV-2 RNA is generally detectable in upper respiratory specimens during the acute phase of infection. The lowest concentration of SARS-CoV-2 viral copies this assay can detect is 138 copies/mL. A negative result does not preclude SARS-Cov-2 infection and should not be used as the sole basis  for treatment or other patient management decisions. A negative result may occur with  improper specimen collection/handling, submission of specimen other than nasopharyngeal swab, presence of viral mutation(s) within the areas targeted by this assay, and inadequate number of viral copies(<138 copies/mL). A negative result must be combined with clinical observations, patient history, and epidemiological information. The expected result is Negative.  Fact Sheet for Patients:  EntrepreneurPulse.com.au  Fact Sheet for Healthcare Providers:  IncredibleEmployment.be  This test is no t yet approved or cleared by the [place] FDA and  has been authorized for detection and/or diagnosis of SARS-CoV-2 by FDA under an Emergency Use Authorization (EUA). This EUA will remain  in effect (meaning this test can be used) for the duration of the COVID-19 declaration under Section 564(b)(1) of the Act, 21 U.S.C.section 360bbb-3(b)(1), unless the authorization is terminated  or revoked sooner.       Influenza A by PCR NEGATIVE NEGATIVE Final   Influenza B by PCR NEGATIVE NEGATIVE Final    Comment: (NOTE) The Xpert Xpress SARS-CoV-2/FLU/RSV plus assay is intended as an aid in the diagnosis of influenza from Nasopharyngeal swab specimens and should not be used as a sole basis for treatment. Nasal washings and aspirates are unacceptable for  Xpert Xpress SARS-CoV-2/FLU/RSV testing.  Fact Sheet for Patients: EntrepreneurPulse.com.au  Fact Sheet for Healthcare Providers: IncredibleEmployment.be  This test is not yet approved or cleared by the [place] FDA and has been authorized for detection and/or diagnosis of SARS-CoV-2 by FDA under an Emergency Use Authorization (EUA). This EUA will remain in effect (meaning this test can be used) for the duration of the COVID-19 declaration under Section 564(b)(1) of the Act, 21 U.S.C. section 360bbb-3(b)(1), unless the authorization is terminated or revoked.  Performed at [facility], [place] [address], [place], [place] [address]   MRSA Next Gen by PCR, Nasal     Status: None   Collection Time: 03/20/21  5:27 AM   Specimen: Nasal Mucosa; Nasal Swab  Result Value Ref Range Status   MRSA by PCR Next Gen NOT DETECTED NOT DETECTED Final    Comment: (NOTE) The GeneXpert MRSA Assay (FDA approved for NASAL specimens only), is one component of a comprehensive MRSA colonization surveillance program. It is not intended to diagnose MRSA infection nor to guide or monitor treatment for MRSA infections. Test performance is not FDA approved in patients less than 41 years old. Performed at [facility], [place] [address], [place], [place] [address]    Time coordinating discharge: 35 minutes  SIGNED:  Kerney Elbe, DO [facility] 03/24/2021, 6:15 PM Pager is on [place]  If 7PM-7AM, please contact night-coverage [email or web address]

## 2021-03-26 NOTE — Discharge Summary (Signed)
Physician Discharge Summary  Chelsea [name] EVO:[id number] DOB: 06-30-1950 DOA: 03/19/2021  PCP: [facility]  Admit date: 03/19/2021 Discharge date: 03/23/2021  Admitted From: Home Disposition:  Home with no PT/OT Follow up  Recommendations for Outpatient Follow-up:  Follow up with PCP in 1-2 weeks Repeat CXR in 3-6 weeks Follow-up for pulmonary nodule in outpatient setting Follow up as an outpatient with Pulmonary  Please obtain CMP/CBC, Mag, Phos in one week Please follow up on the following pending results:  Home Health: No Equipment/Devices: None    Discharge Condition: Stable  CODE STATUS: FULL CODE   Diet recommendation: Heart Healthy Diet   Brief/Interim Summary: The patient is a 70 year old obese Caucasian female with past medical history significant for but limited to anxiety and bipolar disorder, COPD with chronic respiratory failure on 2 L supplemental oxygen, severe OSA with recently diagnosed and not yet on CPAP, CKD stage IIIb, hyperlipidemia, hypothyroidism, diabetes mellitus type 2, chronic diastolic CHF as well as other comorbidities who presented to the hospital shortness of breath.  She was recently hospitalized for GI bleeding and sent home on couple days prior to representing.  She admitted shortness of breath and dry cough.  She had no further breathing since discharge.  Chest x-ray on presentation this time revealed a new left pleural effusion with adjacent atelectasis or infection.  She is given Rocephin, azithromycin and Solu-Medrol and admitted for a COPD exacerbation with a left lower lobe HCAP.  She improved with antibiotic treatment and breathing treatments as well as Solu-Medrol.  She is also found to have recent influenza A which we will continue supportive care for.  Her respiratory status was close to her baseline and she was felt to be stable to be discharged home.  PT OT recommended no follow-up.  She was deemed medically stable to  follow-up with her PCP as well as GI doctor and pulmonologist in outpatient setting.  Discharge Diagnoses:  Principal Problem:   COPD exacerbation ([place]) Active Problems:   Type 2 diabetes mellitus without complication (HCC)   Bipolar I disorder, single manic episode (HCC)   Hypothyroidism   OSA (obstructive sleep apnea)   Anxiety   HLD (hyperlipidemia)   Chronic respiratory failure with hypoxia (HCC)   CKD (chronic kidney disease) stage 3, GFR 30-59 ml/min (HCC)   Chronic diastolic CHF (congestive heart failure) (HCC)   Moderate malnutrition (HCC)  COPD exacerbation/Left lower lobe HCAP -Breathing tx, Breo, solumedrol -Check MRSA PCR, if positive can consider vancomycin -Continue rocephin, azithromycin and she completed 5-day treatment course -Multimorbid order set for pneumonia and COPD utilized  -DuoNeb  QID -Flutter valve - Incentive spirometry -12/23 decrease Prednisone 10 mg daily for 2 more days and will be discharged -Repeat chest x-ray in 3 to 6 weeks   Recent influenza A  -Supportive care    Acute on Chronic respiratory failure with hypoxia -On 2L [place] O2 at baseline -12/21 currently on 3 L O2 -Titrate O2 to maintain SPO2> 92% -12/23 patient's O2 demand now at baseline.   Lung nodule? - 12/21 Per patient and sister 1 physician told them that patient had lung nodule which needed further evaluation, another physician informed them not to worry about a lung nodule. - 12/21 we will review CXR, if patient has lung nodule we will obtain chest CT. -12/20 reviewed CXR and mentioned no evidence of Lung nodule but showed "Lateral view degraded by patient arm position. Midline trachea. Normal heart size. Atherosclerosis in the transverse aorta. Tiny  left pleural effusion, new. No pneumothorax. The Chin overlies the apices minimally. New retrocardiac left lower lobe airspace disease.   Chronic diastolic CHF -Continue bumex  -12/22 Metoprolol 12.5 mg BID and will continue at  discharge -Strict in and out -2.710 L - Daily weight     Filed Weights    03/19/21 2014 03/22/21 0500  Weight: 84.5 kg 84.7 kg      OSA -Patient was recently diagnosed with severe OSA, she has not gotten her CPAP mask yet -Follow-up with pulmonary outpatient   Recent admission for symptomatic anemia with GI bleed -Outpatient GI follow up in 4-6 weeks  Recent Labs       Lab Results  Component Value Date    HGB 8.9 (L) 03/22/2021    HGB 7.5 (L) 03/22/2021    HGB 8.5 (L) 03/21/2021    HGB 8.1 (L) 03/20/2021    HGB 8.6 (L) 03/17/2021    -Stable -12/23 repeat a.m. hemoglobin; consistent with hemolysis of first sample.   -Hemoglobin/hematocrit at the time of discharge was stable at 7.9/25.9 with no evidence of any bleeding - Transfuse for hemoglobin<7 -Repeat CBC within 1 week     CKD stage 3b(-Baseline Cr 1.2-1.3) Recent Labs       Lab Results  Component Value Date    CREATININE 1.28 (H) 03/22/2021    CREATININE 1.36 (H) 03/21/2021    CREATININE 1.35 (H) 03/20/2021    CREATININE 1.45 (H) 03/19/2021    CREATININE 1.19 (H) 03/17/2021    - 12/23 at baseline and BUN/creatinine slightly bumped and went from 44/1.28 is now 52/1.46 and within her normal limits -Follow-up in outpatient setting and avoid further nephrotoxic medications, contrast dyes, hypotension renally dose medications   HLD -Continue crestor    Hypothyroidism -Synthroid 125 mcg daily   DM type 2 controlled with hyperglycemia -12/21 hemoglobin A1c= 6.5 -12/21 Semglee 40 units daily -12/22 increase NovoLog 16 units QAC -12/22 increase resistant SSI -12/23 decrease Prednisone see COPD -CBGs ranging from 80-227   Anxiety and Bipolar disorder -Continue paxil, trazodone, geodon, klonopin    Obesity -Complicates overall prognosis and care -Estimated body mass index is 34.15 kg/m as calculated from the following:   Height as of this encounter: 5\' 2"  (1.575 m).   Weight as of this encounter: 84.7 kg.   -Weight Loss and Dietary Counseling given   Moderate malnutrition -Treatment per nutrition   Thrombocytosis -Likely reactive and patient's platelet count went from 410 and is now 486 -continue monitor and trend and repeat CBC within outpatient setting  Discharge Instructions  Discharge Instructions     Call MD for:  difficulty breathing, headache or visual disturbances   Complete by: As directed    Call MD for:  extreme fatigue   Complete by: As directed    Call MD for:  hives   Complete by: As directed    Call MD for:  persistant dizziness or light-headedness   Complete by: As directed    Call MD for:  persistant nausea and vomiting   Complete by: As directed    Call MD for:  redness, tenderness, or signs of infection (pain, swelling, redness, odor or green/yellow discharge around incision site)   Complete by: As directed    Call MD for:  severe uncontrolled pain   Complete by: As directed    Call MD for:  temperature >100.4   Complete by: As directed    Diet - low sodium heart healthy   Complete by: As  directed    Diet Carb Modified   Complete by: As directed    Discharge instructions   Complete by: As directed    You were cared for by a hospitalist during your hospital stay. If you have any questions about your discharge medications or the care you received while you were in the hospital after you are discharged, you can call the unit and ask to speak with the hospitalist on call if the hospitalist that took care of you is not available. Once you are discharged, your primary care physician will handle any further medical issues. Please note that NO REFILLS for any discharge medications will be authorized once you are discharged, as it is imperative that you return to your primary care physician (or establish a relationship with a primary care physician if you do not have one) for your aftercare needs so that they can reassess your need for medications and monitor your lab  values.  Follow up with PCP and Pulmonary within 1-2 weeks. Take all medications as prescribed. If symptoms change or worsen please return to the ED for evaluation   Discharge wound care:   Complete by: As directed    Frequent turning   Increase activity slowly   Complete by: As directed       Allergies as of 03/23/2021       Reactions   Darvocet [propoxyphene N-acetaminophen] Anaphylaxis   Can take plain tylenol   Fluoxetine Other (See Comments)   Hallucinations    Penicillins Rash   REACTION: rash   Promethazine Hives        Medication List     STOP taking these medications    oseltamivir 30 MG capsule Commonly known as: TAMIFLU       TAKE these medications    acetaminophen 325 MG tablet Commonly known as: TYLENOL Take 650 mg by mouth every 6 (six) hours as needed for moderate pain.   benzonatate 200 MG capsule Commonly known as: TESSALON Take 1 capsule (200 mg total) by mouth 3 (three) times daily as needed for cough.   bumetanide 1 MG tablet Commonly known as: BUMEX Take 1 tablet (1 mg total) by mouth daily.   chlorpheniramine-HYDROcodone 10-8 MG/5ML Suer Commonly known as: TUSSIONEX Take 5 mLs by mouth every 12 (twelve) hours as needed for cough. cough   clonazePAM 1 MG tablet Commonly known as: KLONOPIN Take 1 tablet (1 mg total) by mouth at bedtime.   diclofenac sodium 1 % Gel Commonly known as: VOLTAREN Apply 2 g topically 4 (four) times daily as needed (pain).   Ensure Max Protein Liqd Take 330 mLs (11 oz total) by mouth 2 (two) times daily.   FeroSul 325 (65 FE) MG tablet Generic drug: ferrous sulfate Take 325 mg by mouth daily.   fluticasone furoate-vilanterol 200-25 MCG/INH Aepb Commonly known as: BREO ELLIPTA Inhale 1 puff into the lungs every morning.   Levemir 100 UNIT/ML injection Generic drug: insulin detemir Inject 30 Units into the skin at bedtime.   levothyroxine 125 MCG tablet Commonly known as: SYNTHROID Take 125 mcg  by mouth daily before breakfast.   metFORMIN 500 MG tablet Commonly known as: GLUCOPHAGE Take 500 mg by mouth 2 (two) times daily with a meal.   metoprolol tartrate 25 MG tablet Commonly known as: LOPRESSOR Take 0.5 tablets (12.5 mg total) by mouth 2 (two) times daily.   montelukast 10 MG tablet Commonly known as: SINGULAIR Take 10 mg by mouth daily.   multivitamin with minerals  Tabs tablet Take 1 tablet by mouth daily.   NovoLOG FlexPen 100 UNIT/ML FlexPen Generic drug: insulin aspart Inject 2-12 Units into the skin 3 (three) times daily with meals. Sliding scale   nystatin ointment Commonly known as: MYCOSTATIN Apply 1 application topically 2 (two) times daily as needed (rash).   omeprazole 40 MG capsule Commonly known as: PRILOSEC Take 40 mg by mouth daily.   ondansetron 4 MG tablet Commonly known as: ZOFRAN Take 1 tablet (4 mg total) by mouth every 6 (six) hours as needed for nausea.   Ozempic (1 MG/DOSE) 4 MG/3ML Sopn Generic drug: Semaglutide (1 MG/DOSE) Inject 1 mg into the skin once a week. tuesdays   PARoxetine 40 MG tablet Commonly known as: PAXIL Take 1 tablet (40 mg total) by mouth every morning.   polyethylene glycol 17 g packet Commonly known as: MIRALAX / GLYCOLAX Take 17 g by mouth daily as needed for mild constipation.   predniSONE 10 MG tablet Commonly known as: DELTASONE Take 1 tablet (10 mg total) by mouth daily with breakfast for 2 days.   rOPINIRole 0.25 MG tablet Commonly known as: REQUIP TAKE 3 TABLETS BY MOUTH AT BEDTIME   rosuvastatin 5 MG tablet Commonly known as: CRESTOR Take 5 mg by mouth daily.   traZODone 100 MG tablet Commonly known as: DESYREL Take 1 tablet (100 mg total) by mouth at bedtime.   TRUEplus Insulin Syringe 29G X 1/2" 1 ML Misc Generic drug: INSULIN SYRINGE 1CC/29G 1 each by Other route daily.   Ventolin HFA 108 (90 Base) MCG/ACT inhaler Generic drug: albuterol Inhale 1-2 puffs into the lungs every 4  (four) hours as needed for wheezing or shortness of breath.   albuterol (2.5 MG/3ML) 0.083% nebulizer solution Commonly known as: PROVENTIL Take 2.5 mg by nebulization every 6 (six) hours as needed for wheezing or shortness of breath.   Vitamin D3 125 MCG (5000 UT) Tabs Take 5,000 Units by mouth daily.   ziprasidone 80 MG capsule Commonly known as: GEODON Take 1 capsule (80 mg total) by mouth at bedtime.               Discharge Care Instructions  (From admission, onward)           Start     Ordered   03/23/21 0000  Discharge wound care:       Comments: Frequent turning   03/23/21 1343            Allergies  Allergen Reactions   Darvocet [Propoxyphene N-Acetaminophen] Anaphylaxis    Can take plain tylenol   Fluoxetine Other (See Comments)    Hallucinations    Penicillins Rash    REACTION: rash   Promethazine Hives    Consultations: None  Procedures/Studies: DG Chest 2 View  Result Date: 03/19/2021 CLINICAL DATA:  Shortness of breath EXAM: CHEST - 2 VIEW COMPARISON:  03/14/2021 FINDINGS: Lateral view degraded by patient arm position. Midline trachea. Normal heart size. Atherosclerosis in the transverse aorta. Tiny left pleural effusion, new. No pneumothorax. The Chin overlies the apices minimally. New retrocardiac left lower lobe airspace disease. IMPRESSION: New small left pleural effusion with adjacent atelectasis or infection. Aortic Atherosclerosis (ICD10-I70.0). Electronically Signed   By: Abigail Miyamoto M.D.   On: 03/19/2021 13:39   CT ABDOMEN PELVIS W CONTRAST  Result Date: 03/15/2021 CLINICAL DATA:  Anemia.  GI blood loss. EXAM: CT ABDOMEN AND PELVIS WITH CONTRAST TECHNIQUE: Multidetector CT imaging of the abdomen and pelvis was performed using the standard  protocol following bolus administration of intravenous contrast. CONTRAST:  54mL OMNIPAQUE IOHEXOL 350 MG/ML SOLN COMPARISON:  09/19/2013 FINDINGS: Lower chest: Dependent atelectasis noted in both  lung bases. Hepatobiliary: Small area of low attenuation in the anterior liver, adjacent to the falciform ligament, is in a characteristic location for focal fatty deposition. No suspicious focal abnormality within the liver parenchyma. Trace intrahepatic biliary duct prominence without extrahepatic biliary duct dilatation. Common bile duct is 6 mm diameter in the head of the pancreas. Gallbladder surgically absent. Pancreas: No focal mass lesion. No dilatation of the main duct. No intraparenchymal cyst. No peripancreatic edema. Spleen: No splenomegaly. No focal mass lesion. Adrenals/Urinary Tract: No adrenal nodule or mass. Tiny hypoattenuating lesions in each kidney are too small to characterize but likely benign cysts. No evidence for hydroureter. The urinary bladder appears normal for the degree of distention. Stomach/Bowel: Stomach is unremarkable. No gastric wall thickening. No evidence of outlet obstruction. Duodenum is normally positioned as is the ligament of Treitz. No small bowel wall thickening. No small bowel dilatation. The terminal ileum is normal. The appendix is normal. No gross colonic mass. No colonic wall thickening. Vascular/Lymphatic: There is moderate atherosclerotic calcification of the abdominal aorta without aneurysm. There is no gastrohepatic or hepatoduodenal ligament lymphadenopathy. No retroperitoneal or mesenteric lymphadenopathy. No pelvic sidewall lymphadenopathy. Reproductive: The uterus is unremarkable.  There is no adnexal mass. Other: No intraperitoneal free fluid. Musculoskeletal: No worrisome lytic or sclerotic osseous abnormality. IMPRESSION: 1. No acute findings in the abdomen or pelvis. Specifically, no findings to explain the patient's history of anemia and GI bleeding. 2. Aortic Atherosclerosis (ICD10-I70.0). Electronically Signed   By: Misty Stanley M.D.   On: 03/15/2021 14:12   DG Chest Port 1 View  Result Date: 03/14/2021 CLINICAL DATA:  Shortness of breath EXAM:  PORTABLE CHEST 1 VIEW COMPARISON:  None. FINDINGS: The heart size and mediastinal contours are within normal limits. Both lungs are clear. No pleural effusion. The visualized skeletal structures are unremarkable. IMPRESSION: No active disease. Electronically Signed   By: Macy Mis M.D.   On: 03/14/2021 16:40     Subjective: Seen and examined at bedside and felt much better.  Ambulated in desaturate on 2 L but was back on her home O2.  Denies any chest pain or shortness of breath.  Feels much better and close to her baseline.  No other concerns or complaints at this time.  Discharge Exam: Vitals:   03/23/21 0904 03/23/21 1312  BP: (!) 111/42 (!) 147/58  Pulse: 69 70  Resp:    Temp:  98.2 F (36.8 C)  SpO2:  94%   Vitals:   03/23/21 0736 03/23/21 0737 03/23/21 0904 03/23/21 1312  BP:   (!) 111/42 (!) 147/58  Pulse: 60  69 70  Resp: 17     Temp:    98.2 F (36.8 C)  TempSrc:    Oral  SpO2: 98% 98%  94%  Weight:      Height:       General: Pt is alert, awake, not in acute distress Cardiovascular: RRR, S1/S2 +, no rubs, no gallops Respiratory: Diminished bilaterally with slight wheezing, no rhonchi; unlabored breathing and wearing 2 L of supplemental oxygen via nasal cannula Abdominal: Soft, NT, distended secondary body habitus, bowel sounds + Extremities: Trace edema, no cyanosis  The results of significant diagnostics from this hospitalization (including imaging, microbiology, ancillary and laboratory) are listed below for reference.    Microbiology: Recent Results (from the past 240 hour(s))  Resp Panel by RT-PCR (Flu A&B, Covid) Nasopharyngeal Swab     Status: None   Collection Time: 03/19/21  1:15 PM   Specimen: Nasopharyngeal Swab; Nasopharyngeal(NP) swabs in vial transport medium  Result Value Ref Range Status   SARS Coronavirus 2 by RT PCR NEGATIVE NEGATIVE Final    Comment: (NOTE) SARS-CoV-2 target nucleic acids are NOT DETECTED.  The SARS-CoV-2 RNA is generally  detectable in upper respiratory specimens during the acute phase of infection. The lowest concentration of SARS-CoV-2 viral copies this assay can detect is 138 copies/mL. A negative result does not preclude SARS-Cov-2 infection and should not be used as the sole basis for treatment or other patient management decisions. A negative result may occur with  improper specimen collection/handling, submission of specimen other than nasopharyngeal swab, presence of viral mutation(s) within the areas targeted by this assay, and inadequate number of viral copies(<138 copies/mL). A negative result must be combined with clinical observations, patient history, and epidemiological information. The expected result is Negative.  Fact Sheet for Patients:  EntrepreneurPulse.com.au  Fact Sheet for Healthcare Providers:  IncredibleEmployment.be  This test is no t yet approved or cleared by the [place] FDA and  has been authorized for detection and/or diagnosis of SARS-CoV-2 by FDA under an Emergency Use Authorization (EUA). This EUA will remain  in effect (meaning this test can be used) for the duration of the COVID-19 declaration under Section 564(b)(1) of the Act, 21 U.S.C.section 360bbb-3(b)(1), unless the authorization is terminated  or revoked sooner.       Influenza A by PCR NEGATIVE NEGATIVE Final   Influenza B by PCR NEGATIVE NEGATIVE Final    Comment: (NOTE) The Xpert Xpress SARS-CoV-2/FLU/RSV plus assay is intended as an aid in the diagnosis of influenza from Nasopharyngeal swab specimens and should not be used as a sole basis for treatment. Nasal washings and aspirates are unacceptable for Xpert Xpress SARS-CoV-2/FLU/RSV testing.  Fact Sheet for Patients: EntrepreneurPulse.com.au  Fact Sheet for Healthcare Providers: IncredibleEmployment.be  This test is not yet approved or cleared by the [place] FDA  and has been authorized for detection and/or diagnosis of SARS-CoV-2 by FDA under an Emergency Use Authorization (EUA). This EUA will remain in effect (meaning this test can be used) for the duration of the COVID-19 declaration under Section 564(b)(1) of the Act, 21 U.S.C. section 360bbb-3(b)(1), unless the authorization is terminated or revoked.  Performed at [facility], [place] [address], [place], [place] [address]   MRSA Next Gen by PCR, Nasal     Status: None   Collection Time: 03/20/21  5:27 AM   Specimen: Nasal Mucosa; Nasal Swab  Result Value Ref Range Status   MRSA by PCR Next Gen NOT DETECTED NOT DETECTED Final    Comment: (NOTE) The GeneXpert MRSA Assay (FDA approved for NASAL specimens only), is one component of a comprehensive MRSA colonization surveillance program. It is not intended to diagnose MRSA infection nor to guide or monitor treatment for MRSA infections. Test performance is not FDA approved in patients less than 30 years old. Performed at [facility], [place] [address], [place], [place] [address]     Labs: BNP (last 3 results) Recent Labs    03/14/21 1700 03/19/21 1315  BNP 34.2 01.7    Basic Metabolic Panel: Recent Labs  Lab 03/20/21 0444 03/21/21 0419 03/22/21 0451 03/22/21 2057 03/23/21 0428  NA 136 138 142  --  140  K 4.1 3.8 4.1  --  4.1  CL 103 103 105  --  104  CO2 25 26 27   --  30  GLUCOSE 312* 121* 91 475* 85  BUN 25* 40* 44*  --  52*  CREATININE 1.35* 1.36* 1.28*  --  1.46*  CALCIUM 8.6* 8.9 8.7*  --  8.8*  MG  --  2.1 2.0  --  2.2  PHOS  --  3.2 3.6  --  4.5    Liver Function Tests: Recent Labs  Lab 03/21/21 0419 03/22/21 0451 03/23/21 0428  AST 17 15 20   ALT 11 15 16   ALKPHOS 67 55 62  BILITOT 0.3 0.4 0.4  PROT 6.5 5.5* 5.8*  ALBUMIN 2.7* 2.4* 2.5*    No results for input(s): LIPASE, AMYLASE in the last 168 hours. No results for input(s): AMMONIA in the last 168  hours. CBC: Recent Labs  Lab 03/20/21 0444 03/21/21 0419 03/22/21 0451 03/22/21 1236 03/23/21 0428  WBC 7.6 12.7* 9.0  --  9.7  NEUTROABS  --  10.1* 5.4  --  5.7  HGB 8.1* 8.5* 7.5* 8.9* 7.9*  HCT 25.7* 26.5* 24.2* 28.6* 25.9*  MCV 81.1 80.5 82.9  --  82.7  PLT 370 485* 410*  --  486*    Cardiac Enzymes: No results for input(s): CKTOTAL, CKMB, CKMBINDEX, TROPONINI in the last 168 hours. BNP: Invalid input(s): POCBNP CBG: Recent Labs  Lab 03/22/21 1954 03/22/21 2343 03/23/21 0350 03/23/21 0716 03/23/21 1127  GLUCAP 436* 234* 89 80 227*    D-Dimer No results for input(s): DDIMER in the last 72 hours. Hgb A1c No results for input(s): HGBA1C in the last 72 hours. Lipid Profile No results for input(s): CHOL, HDL, LDLCALC, TRIG, CHOLHDL, LDLDIRECT in the last 72 hours. Thyroid function studies No results for input(s): TSH, T4TOTAL, T3FREE, THYROIDAB in the last 72 hours.  Invalid input(s): FREET3 Anemia work up No results for input(s): VITAMINB12, FOLATE, FERRITIN, TIBC, IRON, RETICCTPCT in the last 72 hours. Urinalysis    Component Value Date/Time   COLORURINE YELLOW 09/27/2020 1846   APPEARANCEUR CLEAR 09/27/2020 1846   LABSPEC 1.005 09/27/2020 1846   PHURINE 6.0 09/27/2020 1846   GLUCOSEU NEGATIVE 09/27/2020 1846   HGBUR NEGATIVE 09/27/2020 1846   BILIRUBINUR NEGATIVE 09/27/2020 1846   KETONESUR NEGATIVE 09/27/2020 1846   PROTEINUR NEGATIVE 09/27/2020 1846   UROBILINOGEN 1.0 12/03/2012 1531   NITRITE NEGATIVE 09/27/2020 1846   LEUKOCYTESUR LARGE (A) 09/27/2020 1846   Sepsis Labs Invalid input(s): PROCALCITONIN,  WBC,  LACTICIDVEN Microbiology Recent Results (from the past 240 hour(s))  Resp Panel by RT-PCR (Flu A&B, Covid) Nasopharyngeal Swab     Status: None   Collection Time: 03/19/21  1:15 PM   Specimen: Nasopharyngeal Swab; Nasopharyngeal(NP) swabs in vial transport medium  Result Value Ref Range Status   SARS Coronavirus 2 by RT PCR NEGATIVE  NEGATIVE Final    Comment: (NOTE) SARS-CoV-2 target nucleic acids are NOT DETECTED.  The SARS-CoV-2 RNA is generally detectable in upper respiratory specimens during the acute phase of infection. The lowest concentration of SARS-CoV-2 viral copies this assay can detect is 138 copies/mL. A negative result does not preclude SARS-Cov-2 infection and should not be used as the sole basis for treatment or other patient management decisions. A negative result may occur with  improper specimen collection/handling, submission of specimen other than nasopharyngeal swab, presence of viral mutation(s) within the areas targeted by this assay, and inadequate number of viral copies(<138 copies/mL). A negative result must be combined with clinical observations, patient  history, and epidemiological information. The expected result is Negative.  Fact Sheet for Patients:  EntrepreneurPulse.com.au  Fact Sheet for Healthcare Providers:  IncredibleEmployment.be  This test is no t yet approved or cleared by the [place] FDA and  has been authorized for detection and/or diagnosis of SARS-CoV-2 by FDA under an Emergency Use Authorization (EUA). This EUA will remain  in effect (meaning this test can be used) for the duration of the COVID-19 declaration under Section 564(b)(1) of the Act, 21 U.S.C.section 360bbb-3(b)(1), unless the authorization is terminated  or revoked sooner.       Influenza A by PCR NEGATIVE NEGATIVE Final   Influenza B by PCR NEGATIVE NEGATIVE Final    Comment: (NOTE) The Xpert Xpress SARS-CoV-2/FLU/RSV plus assay is intended as an aid in the diagnosis of influenza from Nasopharyngeal swab specimens and should not be used as a sole basis for treatment. Nasal washings and aspirates are unacceptable for Xpert Xpress SARS-CoV-2/FLU/RSV testing.  Fact Sheet for Patients: EntrepreneurPulse.com.au  Fact Sheet for Healthcare  Providers: IncredibleEmployment.be  This test is not yet approved or cleared by the [place] FDA and has been authorized for detection and/or diagnosis of SARS-CoV-2 by FDA under an Emergency Use Authorization (EUA). This EUA will remain in effect (meaning this test can be used) for the duration of the COVID-19 declaration under Section 564(b)(1) of the Act, 21 U.S.C. section 360bbb-3(b)(1), unless the authorization is terminated or revoked.  Performed at [facility], [place] [address] [place], [place] [address]   MRSA Next Gen by PCR, Nasal     Status: None   Collection Time: 03/20/21  5:27 AM   Specimen: Nasal Mucosa; Nasal Swab  Result Value Ref Range Status   MRSA by PCR Next Gen NOT DETECTED NOT DETECTED Final    Comment: (NOTE) The GeneXpert MRSA Assay (FDA approved for NASAL specimens only), is one component of a comprehensive MRSA colonization surveillance program. It is not intended to diagnose MRSA infection nor to guide or monitor treatment for MRSA infections. Test performance is not FDA approved in patients less than 57 years old. Performed at [facility], [place] [address], [place], [place] [address]    Time coordinating discharge: 35 minutes  SIGNED:  Kerney Elbe, DO [facility] 03/26/2021, 7:02 PM Pager is on [place]  If 7PM-7AM, please contact night-coverage [email or web address]

## 2021-03-27 DIAGNOSIS — I110 Hypertensive heart disease with heart failure: Secondary | ICD-10-CM | POA: Diagnosis not present

## 2021-03-27 DIAGNOSIS — J449 Chronic obstructive pulmonary disease, unspecified: Secondary | ICD-10-CM | POA: Diagnosis not present

## 2021-03-27 DIAGNOSIS — F061 Catatonic disorder due to known physiological condition: Secondary | ICD-10-CM | POA: Diagnosis not present

## 2021-03-27 DIAGNOSIS — E782 Mixed hyperlipidemia: Secondary | ICD-10-CM | POA: Diagnosis not present

## 2021-03-27 DIAGNOSIS — E11649 Type 2 diabetes mellitus with hypoglycemia without coma: Secondary | ICD-10-CM | POA: Diagnosis not present

## 2021-03-27 DIAGNOSIS — D62 Acute posthemorrhagic anemia: Secondary | ICD-10-CM | POA: Diagnosis not present

## 2021-03-27 DIAGNOSIS — E1169 Type 2 diabetes mellitus with other specified complication: Secondary | ICD-10-CM | POA: Diagnosis not present

## 2021-03-27 DIAGNOSIS — E039 Hypothyroidism, unspecified: Secondary | ICD-10-CM | POA: Diagnosis not present

## 2021-03-27 DIAGNOSIS — I5030 Unspecified diastolic (congestive) heart failure: Secondary | ICD-10-CM | POA: Diagnosis not present

## 2021-04-02 ENCOUNTER — Telehealth (HOSPITAL_COMMUNITY): Payer: Medicare Other | Admitting: Psychiatry

## 2021-04-03 ENCOUNTER — Other Ambulatory Visit: Payer: Self-pay

## 2021-04-03 ENCOUNTER — Encounter: Payer: Self-pay | Admitting: Emergency Medicine

## 2021-04-03 ENCOUNTER — Ambulatory Visit (INDEPENDENT_AMBULATORY_CARE_PROVIDER_SITE_OTHER): Payer: Medicare Other | Admitting: Emergency Medicine

## 2021-04-03 DIAGNOSIS — G4733 Obstructive sleep apnea (adult) (pediatric): Secondary | ICD-10-CM | POA: Diagnosis not present

## 2021-04-03 DIAGNOSIS — J449 Chronic obstructive pulmonary disease, unspecified: Secondary | ICD-10-CM

## 2021-04-03 DIAGNOSIS — J301 Allergic rhinitis due to pollen: Secondary | ICD-10-CM

## 2021-04-03 DIAGNOSIS — J9611 Chronic respiratory failure with hypoxia: Secondary | ICD-10-CM | POA: Diagnosis not present

## 2021-04-03 DIAGNOSIS — Z720 Tobacco use: Secondary | ICD-10-CM | POA: Diagnosis not present

## 2021-04-03 NOTE — Assessment & Plan Note (Signed)
Newly diagnosed.  She just got CPAP and is trying to get used to wearing it.

## 2021-04-03 NOTE — Assessment & Plan Note (Signed)
She stopped smoking in the 80s so unclear that she would still qualify for lung cancer screening program even though her tobacco exposure was high.  There was some question about a possible pulmonary nodule during her recent hospitalization although I have looked at all of her CT scans, chest x-rays and I do not see any evidence of this.  For now we will plan to follow intermittent chest x-ray to ensure no interval change.

## 2021-04-03 NOTE — Patient Instructions (Signed)
We will perform full pulmonary function testing in next office visit. Please continue Breo 1 inhalation once daily. Keep albuterol available to use 2 puffs when needed for shortness of breath, chest tightness, wheezing. We will perform a walking oximetry today on room air.  If your oxygen levels do not drop then we will discontinue your home oxygen. Work hard to get used to wearing your CPAP at night while sleeping. We will plan to intermittently repeat a chest x-ray Follow with Dr. Lamonte Sakai next available with full pulmonary function testing on the same day.

## 2021-04-03 NOTE — Addendum Note (Signed)
Addended by: Gavin Potters R on: 04/03/2021 04:03 PM   Modules accepted: Orders

## 2021-04-03 NOTE — Assessment & Plan Note (Signed)
Continue current regimen

## 2021-04-03 NOTE — Progress Notes (Signed)
Subjective:    Patient ID: Chelsea [name], female    DOB: 07-Apr-1950, 71 y.o.   MRN: [id number]  HPI 71 year old former smoker (85 pack years) with a history of COPD on 2 L/min supplemental oxygen, diabetes, GERD, bipolar disorder, hypertension with chronic diastolic CHF, sleep apnea on oxygen just received CPAP and is trying to use.   She has been hospitalized recently for influenza A and then associated CAP.  Review of chart notes indicates that she apparently had some GI bleeding during that hospitalization as well.  Stabilized and discharged on 03/23/2021.   She was admitted principally with cough, was discharged on O2 at all times. She has improved - SpO2 97% on RA here today. She is on Breo. She uses albuterol, usually rarely  She is on singulair, still has some tussionex but rarely uses due to sedating effect.   There was some question during her recent hospitalization as to whether she may have a pulmonary nodule.  Most recent CT chest is available to me was a CT-PA from 12/03/2012.  This study was equivocal for PE, there was some mild chronic mediastinal left hilar adenopathy, emphysematous change and no apparent nodule.  PSG 12/31/20 reviewed, AHI 43.1 / hour   Review of Systems As per HPI  Past Medical History:  Diagnosis Date   Anxiety    Arthritis    Bipolar disorder (HCC)    COPD (chronic obstructive pulmonary disease) (HCC)    Diabetes mellitus type II    Esophageal stricture    moderate distal   GERD (gastroesophageal reflux disease)    Heart murmur    "HAD AN ECHO YEARS AGO- NOTHING TO BE CONCERNED ABOUT"   Hyperlipemia    Hypertension    Hypothyroidism    Mental disorder    Bipolar   Obesity    Oxygen desaturation during sleep    wears 2 liters at bedtime    Schizophrenia ([place])    patient reports that she is unaware of this   Shortness of breath    with a little activy   Sleep apnea    wears 2 liters of oxygen at bedtime instead of CPAP     Family  History  Problem Relation Age of Onset   Alzheimer's disease Mother    Alcohol abuse Father    Stroke Father    Restless legs syndrome Neg Hx      Social History   Socioeconomic History   Marital status: Divorced    Spouse name: Not on file   Number of children: 0   Years of education: HS   Highest education level: Not on file  Occupational History   Occupation: Retired   Tobacco Use   Smoking status: Former    Packs/day: 3.00    Years: 29.00    Pack years: 87.00    Types: Cigarettes    Quit date: 05/03/1993    Years since quitting: 27.9   Smokeless tobacco: Never  Vaping Use   Vaping Use: Never used  Substance and Sexual Activity   Alcohol use: No    Alcohol/week: 0.0 [name] drinks   Drug use: No   Sexual activity: Not Currently  Other Topics Concern   Not on file  Social History Narrative   Drinks 2 cups of coffee a day   Social Determinants of Health   Financial Resource Strain: Not on file  Food Insecurity: Not on file  Transportation Needs: Not on file  Physical Activity: Not on file  Stress: Not on file  Social Connections: Not on file  Intimate Partner Violence: Not on file     Allergies  Allergen Reactions   Darvocet [Propoxyphene N-Acetaminophen] Anaphylaxis    Can take plain tylenol   Fluoxetine Other (See Comments)    Hallucinations    Penicillins Rash    REACTION: rash   Promethazine Hives     Outpatient Medications Prior to Visit  Medication Sig Dispense Refill   acetaminophen (TYLENOL) 325 MG tablet Take 650 mg by mouth every 6 (six) hours as needed for moderate pain.     albuterol (PROVENTIL) (2.5 MG/3ML) 0.083% nebulizer solution Take 2.5 mg by nebulization every 6 (six) hours as needed for wheezing or shortness of breath.     benzonatate (TESSALON) 200 MG capsule Take 1 capsule (200 mg total) by mouth 3 (three) times daily as needed for cough. 20 capsule 0   bumetanide (BUMEX) 1 MG tablet Take 1 tablet (1 mg total) by mouth daily. 30  tablet 0   chlorpheniramine-HYDROcodone (TUSSIONEX) 10-8 MG/5ML SUER Take 5 mLs by mouth every 12 (twelve) hours as needed for cough. cough     Cholecalciferol (VITAMIN D3) 5000 units TABS Take 5,000 Units by mouth daily.     clonazePAM (KLONOPIN) 1 MG tablet Take 1 tablet (1 mg total) by mouth at bedtime. 30 tablet 2   diclofenac sodium (VOLTAREN) 1 % GEL Apply 2 g topically 4 (four) times daily as needed (pain).     Ensure Max Protein (ENSURE MAX PROTEIN) LIQD Take 330 mLs (11 oz total) by mouth 2 (two) times daily. 3300 mL 0   FEROSUL 325 (65 Fe) MG tablet Take 325 mg by mouth daily.     fluticasone furoate-vilanterol (BREO ELLIPTA) 200-25 MCG/INH AEPB Inhale 1 puff into the lungs every morning.     insulin aspart (NOVOLOG FLEXPEN) 100 UNIT/ML FlexPen Inject 2-12 Units into the skin 3 (three) times daily with meals. Sliding scale     LEVEMIR 100 UNIT/ML injection Inject 30 Units into the skin at bedtime.     levothyroxine (SYNTHROID) 125 MCG tablet Take 125 mcg by mouth daily before breakfast.     metFORMIN (GLUCOPHAGE) 500 MG tablet Take 500 mg by mouth 2 (two) times daily with a meal.     metoprolol tartrate (LOPRESSOR) 25 MG tablet Take 0.5 tablets (12.5 mg total) by mouth 2 (two) times daily. 60 tablet 0   montelukast (SINGULAIR) 10 MG tablet Take 10 mg by mouth daily.     Multiple Vitamin (MULTIVITAMIN WITH MINERALS) TABS tablet Take 1 tablet by mouth daily. 30 tablet 0   nystatin ointment (MYCOSTATIN) Apply 1 application topically 2 (two) times daily as needed (rash).     omeprazole (PRILOSEC) 40 MG capsule Take 40 mg by mouth daily.     ondansetron (ZOFRAN) 4 MG tablet Take 1 tablet (4 mg total) by mouth every 6 (six) hours as needed for nausea. 20 tablet 0   OZEMPIC, 1 MG/DOSE, 4 MG/3ML SOPN Inject 1 mg into the skin once a week. tuesdays     PARoxetine (PAXIL) 40 MG tablet Take 1 tablet (40 mg total) by mouth every morning. 90 tablet 0   polyethylene glycol (MIRALAX / GLYCOLAX) 17 g  packet Take 17 g by mouth daily as needed for mild constipation. 14 each 0   rOPINIRole (REQUIP) 0.25 MG tablet TAKE 3 TABLETS BY MOUTH AT BEDTIME (Patient taking differently: Take 0.75 mg by mouth at bedtime.) 90 tablet 5  rosuvastatin (CRESTOR) 5 MG tablet Take 5 mg by mouth daily.     traZODone (DESYREL) 100 MG tablet Take 1 tablet (100 mg total) by mouth at bedtime. 90 tablet 0   TRUEPLUS INSULIN SYRINGE 29G X 1/2" 1 ML MISC 1 each by Other route daily.     VENTOLIN HFA 108 (90 Base) MCG/ACT inhaler Inhale 1-2 puffs into the lungs every 4 (four) hours as needed for wheezing or shortness of breath.     ziprasidone (GEODON) 80 MG capsule Take 1 capsule (80 mg total) by mouth at bedtime. 90 capsule 0   No facility-administered medications prior to visit.         Objective:   Physical Exam Vitals:   04/03/21 1508  BP: (!) 146/80  Pulse: 68  Temp: 97.9 F (36.6 C)  TempSrc: Oral  SpO2: 96%  Weight: 186 lb 6.4 oz (84.6 kg)  Height: 5' (1.524 m)    Gen: Pleasant, overweight woman, in no distress, somewhat quiet affect  ENT: No lesions,  mouth clear,  oropharynx clear, no postnasal drip, edentulous  Neck: No JVD, no stridor  Lungs: No use of accessory muscles, distant but no crackles or wheezing on normal respiration, no wheeze on forced expiration  Cardiovascular: RRR, heart sounds normal, no murmur or gallops, no peripheral edema  Musculoskeletal: No deformities, no cyanosis or clubbing  Neuro: alert, awake, non focal  Skin: Warm, no lesions or rash       Assessment & Plan:  COPD (chronic obstructive pulmonary disease) ([place]) She may benefit from the addition of a LAMA to her regimen.  For now I will continue the [facility] and plan to perform pulmonary function testing to quantify her degree of obstruction.  We will perform full pulmonary function testing in next office visit. Please continue Breo 1 inhalation once daily. Keep albuterol available to use 2 puffs when  needed for shortness of breath, chest tightness, wheezing. Follow with Dr. Lamonte Sakai next available with full pulmonary function testing on the same day.   OSA (obstructive sleep apnea) Newly diagnosed.  She just got CPAP and is trying to get used to wearing it.  Chronic respiratory failure with hypoxia (HCC) Noted during her recent exacerbation for influenza and community-acquired pneumonia.  She is improved clinically.  We will perform a walking oximetry today to see if she still needs supplemental oxygen.  Allergic rhinitis Continue current regimen  Tobacco use She stopped smoking in the 80s so unclear that she would still qualify for lung cancer screening program even though her tobacco exposure was high.  There was some question about a possible pulmonary nodule during her recent hospitalization although I have looked at all of her CT scans, chest x-rays and I do not see any evidence of this.  For now we will plan to follow intermittent chest x-ray to ensure no interval change.  Baltazar Apo, MD, PhD 04/03/2021, 3:35 PM [facility] [phone] or if no answer before 7:00PM call [phone] For any issues after 7:00PM please call eLink [phone]

## 2021-04-03 NOTE — Assessment & Plan Note (Signed)
Noted during her recent exacerbation for influenza and community-acquired pneumonia.  She is improved clinically.  We will perform a walking oximetry today to see if she still needs supplemental oxygen.

## 2021-04-03 NOTE — Assessment & Plan Note (Signed)
She may benefit from the addition of a LAMA to her regimen.  For now I will continue the [facility] and plan to perform pulmonary function testing to quantify her degree of obstruction.  We will perform full pulmonary function testing in next office visit. Please continue Breo 1 inhalation once daily. Keep albuterol available to use 2 puffs when needed for shortness of breath, chest tightness, wheezing. Follow with Dr. Lamonte Sakai next available with full pulmonary function testing on the same day.

## 2021-04-10 DIAGNOSIS — I10 Essential (primary) hypertension: Secondary | ICD-10-CM | POA: Diagnosis not present

## 2021-04-10 DIAGNOSIS — F200 Paranoid schizophrenia: Secondary | ICD-10-CM | POA: Diagnosis not present

## 2021-04-10 DIAGNOSIS — G4700 Insomnia, unspecified: Secondary | ICD-10-CM | POA: Diagnosis not present

## 2021-04-10 DIAGNOSIS — D62 Acute posthemorrhagic anemia: Secondary | ICD-10-CM | POA: Diagnosis not present

## 2021-04-10 DIAGNOSIS — E669 Obesity, unspecified: Secondary | ICD-10-CM | POA: Diagnosis not present

## 2021-04-10 DIAGNOSIS — J449 Chronic obstructive pulmonary disease, unspecified: Secondary | ICD-10-CM | POA: Diagnosis not present

## 2021-04-10 DIAGNOSIS — Z6833 Body mass index (BMI) 33.0-33.9, adult: Secondary | ICD-10-CM | POA: Diagnosis not present

## 2021-04-10 DIAGNOSIS — I5030 Unspecified diastolic (congestive) heart failure: Secondary | ICD-10-CM | POA: Diagnosis not present

## 2021-04-10 DIAGNOSIS — E119 Type 2 diabetes mellitus without complications: Secondary | ICD-10-CM | POA: Diagnosis not present

## 2021-04-10 DIAGNOSIS — E039 Hypothyroidism, unspecified: Secondary | ICD-10-CM | POA: Diagnosis not present

## 2021-04-10 DIAGNOSIS — G4733 Obstructive sleep apnea (adult) (pediatric): Secondary | ICD-10-CM | POA: Diagnosis not present

## 2021-04-10 DIAGNOSIS — Z794 Long term (current) use of insulin: Secondary | ICD-10-CM | POA: Diagnosis not present

## 2021-04-19 DIAGNOSIS — R058 Other specified cough: Secondary | ICD-10-CM | POA: Diagnosis not present

## 2021-04-22 DIAGNOSIS — K922 Gastrointestinal hemorrhage, unspecified: Secondary | ICD-10-CM | POA: Diagnosis not present

## 2021-04-22 DIAGNOSIS — D509 Iron deficiency anemia, unspecified: Secondary | ICD-10-CM | POA: Diagnosis not present

## 2021-04-24 DIAGNOSIS — I5030 Unspecified diastolic (congestive) heart failure: Secondary | ICD-10-CM | POA: Diagnosis not present

## 2021-04-24 DIAGNOSIS — E1122 Type 2 diabetes mellitus with diabetic chronic kidney disease: Secondary | ICD-10-CM | POA: Diagnosis not present

## 2021-04-24 DIAGNOSIS — B372 Candidiasis of skin and nail: Secondary | ICD-10-CM | POA: Diagnosis not present

## 2021-04-24 DIAGNOSIS — J449 Chronic obstructive pulmonary disease, unspecified: Secondary | ICD-10-CM | POA: Diagnosis not present

## 2021-04-24 DIAGNOSIS — E119 Type 2 diabetes mellitus without complications: Secondary | ICD-10-CM | POA: Diagnosis not present

## 2021-04-24 DIAGNOSIS — N1832 Chronic kidney disease, stage 3b: Secondary | ICD-10-CM | POA: Diagnosis not present

## 2021-04-25 DIAGNOSIS — E11649 Type 2 diabetes mellitus with hypoglycemia without coma: Secondary | ICD-10-CM | POA: Diagnosis not present

## 2021-04-25 DIAGNOSIS — E039 Hypothyroidism, unspecified: Secondary | ICD-10-CM | POA: Diagnosis not present

## 2021-04-25 DIAGNOSIS — F061 Catatonic disorder due to known physiological condition: Secondary | ICD-10-CM | POA: Diagnosis not present

## 2021-04-25 DIAGNOSIS — I10 Essential (primary) hypertension: Secondary | ICD-10-CM | POA: Diagnosis not present

## 2021-04-25 DIAGNOSIS — D62 Acute posthemorrhagic anemia: Secondary | ICD-10-CM | POA: Diagnosis not present

## 2021-04-25 DIAGNOSIS — E782 Mixed hyperlipidemia: Secondary | ICD-10-CM | POA: Diagnosis not present

## 2021-04-25 DIAGNOSIS — I5030 Unspecified diastolic (congestive) heart failure: Secondary | ICD-10-CM | POA: Diagnosis not present

## 2021-04-25 DIAGNOSIS — J449 Chronic obstructive pulmonary disease, unspecified: Secondary | ICD-10-CM | POA: Diagnosis not present

## 2021-04-25 DIAGNOSIS — D631 Anemia in chronic kidney disease: Secondary | ICD-10-CM | POA: Diagnosis not present

## 2021-05-09 ENCOUNTER — Other Ambulatory Visit: Payer: Self-pay

## 2021-05-09 ENCOUNTER — Encounter: Payer: Self-pay | Admitting: Emergency Medicine

## 2021-05-09 ENCOUNTER — Ambulatory Visit (INDEPENDENT_AMBULATORY_CARE_PROVIDER_SITE_OTHER): Payer: Medicare Other | Admitting: Emergency Medicine

## 2021-05-09 DIAGNOSIS — G4733 Obstructive sleep apnea (adult) (pediatric): Secondary | ICD-10-CM | POA: Diagnosis not present

## 2021-05-09 DIAGNOSIS — J449 Chronic obstructive pulmonary disease, unspecified: Secondary | ICD-10-CM

## 2021-05-09 LAB — PULMONARY FUNCTION TEST
DL/VA % pred: 85 %
DL/VA: 3.59 ml/min/mmHg/L
DLCO cor % pred: 81 %
DLCO cor: 15.41 ml/min/mmHg
DLCO unc % pred: 63 %
DLCO unc: 11.98 ml/min/mmHg
FEF 25-75 Post: 1.45 L/sec
FEF 25-75 Pre: 1.43 L/sec
FEF2575-%Change-Post: 1 %
FEF2575-%Pred-Post: 75 %
FEF2575-%Pred-Pre: 73 %
FEV1-%Change-Post: 0 %
FEV1-%Pred-Post: 89 %
FEV1-%Pred-Pre: 89 %
FEV1-Post: 2 L
FEV1-Pre: 2 L
FEV1FVC-%Change-Post: 4 %
FEV1FVC-%Pred-Pre: 95 %
FEV6-%Change-Post: -3 %
FEV6-%Pred-Post: 93 %
FEV6-%Pred-Pre: 96 %
FEV6-Post: 2.62 L
FEV6-Pre: 2.73 L
FEV6FVC-%Change-Post: 0 %
FEV6FVC-%Pred-Post: 104 %
FEV6FVC-%Pred-Pre: 104 %
FVC-%Change-Post: -3 %
FVC-%Pred-Post: 89 %
FVC-%Pred-Pre: 92 %
FVC-Post: 2.62 L
FVC-Pre: 2.73 L
Post FEV1/FVC ratio: 76 %
Post FEV6/FVC ratio: 100 %
Pre FEV1/FVC ratio: 73 %
Pre FEV6/FVC Ratio: 100 %
RV % pred: 110 %
RV: 2.32 L
TLC % pred: 101 %
TLC: 5 L

## 2021-05-09 NOTE — Assessment & Plan Note (Signed)
Mild obstruction noted on her pulmonary function testing from today.  No significant bronchodilator response.  She is currently benefiting from [place], minimal albuterol use.  We will plan to continue this regimen.  I did recommend that she rinse and gargle after using the ICS.

## 2021-05-09 NOTE — Patient Instructions (Signed)
Please continue to use your CPAP reliably every night. Continue Breo 1 inhalation once daily.  Rinse and gargle after using. Keep your albuterol available to use either 2 puffs or 1 nebulizer treatment if you needed for shortness of breath, chest tightness, wheezing. Follow Dr. Lamonte Sakai in 1 year or sooner if you have any problems.

## 2021-05-09 NOTE — Assessment & Plan Note (Signed)
She is doing well on her CPAP since her mask was changed to a full facemask.  Wearing it through the night, confirmed good clinical benefit today with improved daytime energy.  Plan to continue same.

## 2021-05-09 NOTE — Progress Notes (Signed)
Subjective:    Patient ID: Chelsea [name], female    DOB: Nov 06, 1950, 71 y.o.   MRN: [id number]  HPI 71 year old former smoker (85 pack years) with a history of COPD on 2 L/min supplemental oxygen, diabetes, GERD, bipolar disorder, hypertension with chronic diastolic CHF, sleep apnea on oxygen just received CPAP and is trying to use.   She has been hospitalized recently for influenza A and then associated CAP.  Review of chart notes indicates that she apparently had some GI bleeding during that hospitalization as well.  Stabilized and discharged on 03/23/2021.   She was admitted principally with cough, was discharged on O2 at all times. She has improved - SpO2 97% on RA here today. She is on Breo. She uses albuterol, usually rarely  She is on singulair, still has some tussionex but rarely uses due to sedating effect.   There was some question during her recent hospitalization as to whether she may have a pulmonary nodule.  Most recent CT chest is available to me was a CT-PA from 12/03/2012.  This study was equivocal for PE, there was some mild chronic mediastinal left hilar adenopathy, emphysematous change and no apparent nodule.  PSG 12/31/20 reviewed, AHI 43.1 / hour   ROV 05/09/21 --71 year old woman who follows up for history of COPD with associated hypoxemic respiratory failure, newly diagnosed OSA trying to get used to CPAP. PMH: Diabetes, GERD, bipolar, hypertension with chronic diastolic CHF She is wearing her CPAP reliably - changed to a full face mask. She believes that she is benefiting - better daytime energy. She believes that her breathing is doing well, better endurance.   Pulmonary function testing performed today and reviewed by me, shows evidence for mild obstruction without a bronchodilator response, normal lung volumes, decreased diffusion capacity that corrects to the normal range when adjusted for alveolar volume.  There is some curve to the flow-volume loop.   Review of  Systems As per HPI  Past Medical History:  Diagnosis Date   Anxiety    Arthritis    Bipolar disorder (HCC)    COPD (chronic obstructive pulmonary disease) (HCC)    Diabetes mellitus type II    Esophageal stricture    moderate distal   GERD (gastroesophageal reflux disease)    Heart murmur    "HAD AN ECHO YEARS AGO- NOTHING TO BE CONCERNED ABOUT"   Hyperlipemia    Hypertension    Hypothyroidism    Mental disorder    Bipolar   Obesity    Oxygen desaturation during sleep    wears 2 liters at bedtime    Schizophrenia ([place])    patient reports that she is unaware of this   Shortness of breath    with a little activy   Sleep apnea    wears 2 liters of oxygen at bedtime instead of CPAP     Family History  Problem Relation Age of Onset   Alzheimer's disease Mother    Alcohol abuse Father    Stroke Father    Restless legs syndrome Neg Hx      Social History   Socioeconomic History   Marital status: Divorced    Spouse name: Not on file   Number of children: 0   Years of education: HS   Highest education level: Not on file  Occupational History   Occupation: Retired   Tobacco Use   Smoking status: Former    Packs/day: 3.00    Years: 29.00    Pack years:  87.00    Types: Cigarettes    Quit date: 05/03/1993    Years since quitting: 28.0   Smokeless tobacco: Never  Vaping Use   Vaping Use: Never used  Substance and Sexual Activity   Alcohol use: No    Alcohol/week: 0.0 [name] drinks   Drug use: No   Sexual activity: Not Currently  Other Topics Concern   Not on file  Social History Narrative   Drinks 2 cups of coffee a day   Social Determinants of Health   Financial Resource Strain: Not on file  Food Insecurity: Not on file  Transportation Needs: Not on file  Physical Activity: Not on file  Stress: Not on file  Social Connections: Not on file  Intimate Partner Violence: Not on file     Allergies  Allergen Reactions   Darvocet [Propoxyphene  N-Acetaminophen] Anaphylaxis    Can take plain tylenol   Fluoxetine Other (See Comments)    Hallucinations    Penicillins Rash    REACTION: rash   Promethazine Hives     Outpatient Medications Prior to Visit  Medication Sig Dispense Refill   acetaminophen (TYLENOL) 325 MG tablet Take 650 mg by mouth every 6 (six) hours as needed for moderate pain.     albuterol (PROVENTIL) (2.5 MG/3ML) 0.083% nebulizer solution Take 2.5 mg by nebulization every 6 (six) hours as needed for wheezing or shortness of breath.     bumetanide (BUMEX) 1 MG tablet Take 1 tablet (1 mg total) by mouth daily. 30 tablet 0   Cholecalciferol (VITAMIN D3) 5000 units TABS Take 5,000 Units by mouth daily.     clonazePAM (KLONOPIN) 1 MG tablet Take 1 tablet (1 mg total) by mouth at bedtime. 30 tablet 2   diclofenac sodium (VOLTAREN) 1 % GEL Apply 2 g topically 4 (four) times daily as needed (pain).     Ensure Max Protein (ENSURE MAX PROTEIN) LIQD Take 330 mLs (11 oz total) by mouth 2 (two) times daily. 3300 mL 0   FEROSUL 325 (65 Fe) MG tablet Take 325 mg by mouth daily.     fluticasone furoate-vilanterol (BREO ELLIPTA) 200-25 MCG/INH AEPB Inhale 1 puff into the lungs every morning.     insulin aspart (NOVOLOG FLEXPEN) 100 UNIT/ML FlexPen Inject 2-12 Units into the skin 3 (three) times daily with meals. Sliding scale     LEVEMIR 100 UNIT/ML injection Inject 30 Units into the skin at bedtime.     levothyroxine (SYNTHROID) 125 MCG tablet Take 125 mcg by mouth daily before breakfast.     metFORMIN (GLUCOPHAGE) 500 MG tablet Take 500 mg by mouth 2 (two) times daily with a meal.     metoprolol tartrate (LOPRESSOR) 25 MG tablet Take 0.5 tablets (12.5 mg total) by mouth 2 (two) times daily. 60 tablet 0   montelukast (SINGULAIR) 10 MG tablet Take 10 mg by mouth daily.     Multiple Vitamin (MULTIVITAMIN WITH MINERALS) TABS tablet Take 1 tablet by mouth daily. 30 tablet 0   nystatin ointment (MYCOSTATIN) Apply 1 application topically  2 (two) times daily as needed (rash).     omeprazole (PRILOSEC) 40 MG capsule Take 40 mg by mouth daily.     ondansetron (ZOFRAN) 4 MG tablet Take 1 tablet (4 mg total) by mouth every 6 (six) hours as needed for nausea. 20 tablet 0   OZEMPIC, 1 MG/DOSE, 4 MG/3ML SOPN Inject 1 mg into the skin once a week. tuesdays     PARoxetine (PAXIL) 40 MG  tablet Take 1 tablet (40 mg total) by mouth every morning. 90 tablet 0   polyethylene glycol (MIRALAX / GLYCOLAX) 17 g packet Take 17 g by mouth daily as needed for mild constipation. 14 each 0   rOPINIRole (REQUIP) 0.25 MG tablet TAKE 3 TABLETS BY MOUTH AT BEDTIME (Patient taking differently: Take 0.75 mg by mouth at bedtime.) 90 tablet 5   rosuvastatin (CRESTOR) 5 MG tablet Take 5 mg by mouth daily.     traZODone (DESYREL) 100 MG tablet Take 1 tablet (100 mg total) by mouth at bedtime. 90 tablet 0   TRUEPLUS INSULIN SYRINGE 29G X 1/2" 1 ML MISC 1 each by Other route daily.     VENTOLIN HFA 108 (90 Base) MCG/ACT inhaler Inhale 1-2 puffs into the lungs every 4 (four) hours as needed for wheezing or shortness of breath.     ziprasidone (GEODON) 80 MG capsule Take 1 capsule (80 mg total) by mouth at bedtime. 90 capsule 0   benzonatate (TESSALON) 200 MG capsule Take 1 capsule (200 mg total) by mouth 3 (three) times daily as needed for cough. (Patient not taking: Reported on 05/09/2021) 20 capsule 0   chlorpheniramine-HYDROcodone (TUSSIONEX) 10-8 MG/5ML SUER Take 5 mLs by mouth every 12 (twelve) hours as needed for cough. cough (Patient not taking: Reported on 05/09/2021)     No facility-administered medications prior to visit.         Objective:   Physical Exam Vitals:   05/09/21 1108  BP: 116/68  Pulse: 70  Temp: 97.9 F (36.6 C)  TempSrc: Oral  SpO2: 95%  Weight: 182 lb 6.4 oz (82.7 kg)  Height: 5\' 3"  (1.6 m)    Gen: Pleasant, overweight woman, in no distress, somewhat quiet affect  ENT: No lesions,  mouth clear,  oropharynx clear, no postnasal  drip, edentulous  Neck: No JVD, no stridor  Lungs: No use of accessory muscles, distant but no crackles or wheezing on normal respiration, no wheeze on forced expiration  Cardiovascular: RRR, heart sounds normal, no murmur or gallops, no peripheral edema  Musculoskeletal: No deformities, no cyanosis or clubbing  Neuro: alert, awake, non focal  Skin: Warm, no lesions or rash       Assessment & Plan:  OSA (obstructive sleep apnea) She is doing well on her CPAP since her mask was changed to a full facemask.  Wearing it through the night, confirmed good clinical benefit today with improved daytime energy.  Plan to continue same.  COPD (chronic obstructive pulmonary disease) (HCC) Mild obstruction noted on her pulmonary function testing from today.  No significant bronchodilator response.  She is currently benefiting from [place], minimal albuterol use.  We will plan to continue this regimen.  I did recommend that she rinse and gargle after using the ICS.  Baltazar Apo, MD, PhD 05/09/2021, 11:21 AM [facility] [phone] or if no answer before 7:00PM call [phone] For any issues after 7:00PM please call eLink [phone]

## 2021-05-09 NOTE — Progress Notes (Signed)
PFT done today. 

## 2021-06-10 ENCOUNTER — Telehealth (HOSPITAL_COMMUNITY): Payer: Medicare Other | Admitting: Psychiatry

## 2021-06-10 ENCOUNTER — Other Ambulatory Visit (HOSPITAL_COMMUNITY): Payer: Self-pay | Admitting: Psychiatry

## 2021-06-10 DIAGNOSIS — F411 Generalized anxiety disorder: Secondary | ICD-10-CM

## 2021-06-10 DIAGNOSIS — F200 Paranoid schizophrenia: Secondary | ICD-10-CM

## 2021-06-18 ENCOUNTER — Other Ambulatory Visit: Payer: Self-pay

## 2021-06-18 ENCOUNTER — Telehealth (HOSPITAL_BASED_OUTPATIENT_CLINIC_OR_DEPARTMENT_OTHER): Payer: Medicare Other | Admitting: Psychiatry

## 2021-06-18 ENCOUNTER — Encounter (HOSPITAL_COMMUNITY): Payer: Self-pay | Admitting: Psychiatry

## 2021-06-18 DIAGNOSIS — F200 Paranoid schizophrenia: Secondary | ICD-10-CM | POA: Diagnosis not present

## 2021-06-18 DIAGNOSIS — F411 Generalized anxiety disorder: Secondary | ICD-10-CM

## 2021-06-18 MED ORDER — CLONAZEPAM 1 MG PO TABS: 1.0000 mg | ORAL_TABLET | Freq: Every day | ORAL | 2 refills | Status: DC

## 2021-06-18 MED ORDER — PAROXETINE HCL 40 MG PO TABS: 40.0000 mg | ORAL_TABLET | Freq: Every morning | ORAL | 0 refills | Status: DC

## 2021-06-18 MED ORDER — TRAZODONE HCL 100 MG PO TABS: 100.0000 mg | ORAL_TABLET | Freq: Every day | ORAL | 0 refills | Status: DC

## 2021-06-18 MED ORDER — ZIPRASIDONE HCL 80 MG PO CAPS: 80.0000 mg | ORAL_CAPSULE | Freq: Every day | ORAL | 0 refills | Status: DC

## 2021-06-18 NOTE — Progress Notes (Signed)
Virtual Visit via Telephone Note ? ?I connected with Chelsea [name] on 06/18/21 at  2:20 PM EDT by telephone and verified that I am speaking with the correct person using two identifiers. ? ?Location: ?Patient: Home ?Provider: Home Office ?  ?I discussed the limitations, risks, security and privacy concerns of performing an evaluation and management service by telephone and the availability of in person appointments. I also discussed with the patient that there may be a patient responsible charge related to this service. The patient expressed understanding and agreed to proceed. ? ? ?History of Present Illness: ?Patient is evaluated by phone session.  She was admitted before the Christmas for respiratory issues.  Initially she had COVID and then respiratory failure.  She is doing much better now.  She is active and try to walk 10-15 times a day.  She is getting along with her sister.  She denies any paranoia, hallucination, anger or any panic attack.  She feels the current medicine is working and denies any side effects.  She has no rash, itching tremors or shakes.  She is active and able to do her ADLs.  She is sleeping good.  She does not want to change the medication. ? ?Past Psychiatric History: ?H/O inpatient at [facility] and [facility] for mania and psychosis.  No h/o suicidal attempt.  ? ?Psychiatric Specialty Exam: ?Physical Exam  ?Review of Systems  ?Weight 182 lb (82.6 kg).There is no height or weight on file to calculate BMI.  ?General Appearance: NA  ?Eye Contact:  NA  ?Speech:  Slow  ?Volume:  Decreased  ?Mood:  Euthymic  ?Affect:  NA  ?Thought Process:  Descriptions of Associations: Intact  ?Orientation:  Full (Time, Place, and Person)  ?Thought Content:  WDL  ?Suicidal Thoughts:  No  ?Homicidal Thoughts:  No  ?Memory:  Immediate;   Fair ?Recent;   Fair ?Remote;   Fair  ?Judgement:  Intact  ?Insight:  Present  ?Psychomotor Activity:  NA  ?Concentration:  Concentration: Fair and Attention Span:  Fair  ?Recall:  Fair  ?Fund of Knowledge:  Fair  ?Language:  Good  ?Akathisia:  No  ?Handed:  Right  ?AIMS (if indicated):     ?Assets:  Communication Skills ?Desire for Improvement ?Housing  ?ADL's:  Intact  ?Cognition:  WNL  ?Sleep:   ok  ? ? ? ? ?Assessment and Plan: ?Schizophrenia chronic paranoid type.  Generalized anxiety disorder. ? ?Patient is a stable on her current medication.  Continue Klonopin 1 mg at bedtime, trazodone 100 mg at bedtime, Paxil 40 mg daily and Geodon 80 mg at bedtime.  Discussed medication side effects and benefits.  Recommended to call us back if she has any question or any concern.  Follow-up in 3 months. ? ?Follow Up Instructions: ? ?  ?I discussed the assessment and treatment plan with the patient. The patient was provided an opportunity to ask questions and all were answered. The patient agreed with the plan and demonstrated an understanding of the instructions. ?  ?The patient was advised to call back or seek an in-person evaluation if the symptoms worsen or if the condition fails to improve as anticipated. ? ?I provided 17 minutes of non-face-to-face time during this encounter. ? ? ?Kathlee Nations, MD  ?

## 2021-06-19 ENCOUNTER — Other Ambulatory Visit: Payer: Self-pay | Admitting: Adult Health

## 2021-08-19 ENCOUNTER — Telehealth: Payer: Self-pay

## 2021-08-19 NOTE — Telephone Encounter (Signed)
Called [facility]. I was told the setup date was 05/31/21. She has a Luna G3.

## 2021-08-19 NOTE — Telephone Encounter (Signed)
Patient's sister left a voicemail with our office this morning requesting an appointment for the patient.  She received a new CPAP a few months ago.

## 2021-08-20 NOTE — Telephone Encounter (Signed)
Call sister to set up initial cpap DME [facility] set up 06-01-2021 Needs appt 07-02-2021 thru 08-31-2021.

## 2021-08-20 NOTE — Telephone Encounter (Signed)
Pt was scheduled for his initial CPAP on 08-29-21 Pt sister was informed to bring machine and power cord to the appointment DME [facility] set up 06-01-2021 Needs appt 07-02-2021 thru 08-31-2021

## 2021-08-20 NOTE — Telephone Encounter (Signed)
Called pt and LVM stating that she is needing to schedule her Initial Cpap visit. DME and between dates are in pt's SnapShot.

## 2021-08-29 ENCOUNTER — Encounter: Payer: Self-pay | Admitting: Neurology

## 2021-08-29 ENCOUNTER — Ambulatory Visit (INDEPENDENT_AMBULATORY_CARE_PROVIDER_SITE_OTHER): Payer: Medicare Other | Admitting: Neurology

## 2021-08-29 VITALS — BP 124/58 | HR 67 | Ht 62.0 in | Wt 191.2 lb

## 2021-08-29 DIAGNOSIS — G2581 Restless legs syndrome: Secondary | ICD-10-CM

## 2021-08-29 DIAGNOSIS — Z789 Other specified health status: Secondary | ICD-10-CM

## 2021-08-29 DIAGNOSIS — G4733 Obstructive sleep apnea (adult) (pediatric): Secondary | ICD-10-CM | POA: Diagnosis not present

## 2021-08-29 NOTE — Progress Notes (Signed)
Subjective:    Patient ID: Chelsea [name] is a 71 y.o. female.  HPI    Interim history:   Chelsea [name] is a 71 year old right-handed woman with a complex medical history of hypothyroidism, hypertension, type 2 diabetes, hyperlipidemia, morbid obesity, mood disorder, (incl. schizophrenia for which she is followed by mental health), Vitamin D deficiency, allergic rhinitis, osteoarthritis, who presents for follow-up consultation of her sleep apnea.  She is accompanied by her sister today.  I last saw her in January 2020, at which time she reported difficulty sleeping.  She had a prior diagnosis of sleep apnea.  Ropinirole for restless leg symptoms.    She saw Chelsea [name] in the interim on 11/09/2018 at which time she was maintained on ropinirole.  She saw Chelsea Givens, NP on 11/10/2019 for restless leg syndrome and was stable at the time.  She saw Chelsea Givens, NP on 12/04/2020 at which time she was maintained on ropinirole for her restless legs.  We pursued a home sleep test for reevaluation of her OSA.  She had a home sleep test on 12/31/2020 which indicated severe obstructive sleep apnea with an AHI of 43.1/h, O2 nadir 77%.  Moderate snoring was detected for the most part.  She was advised to restart AutoPap therapy.  Her set up date was 04/02/2021.    Today, 08/29/2021: I reviewed her compliance data for the past few months, between 04/02/2021 through 06/30/2021 she used her machine 67 out of 90 days with percent use days greater than 4 hours at 1.1% only, indicating very low compliance with an average usage for days on treatment of only 22 minutes.  Her sister reports that the patient has a hard time tolerating treatment.  She has tried different masks.  Patient reports that the pressure does not seem right, sometimes she feels that it is not high enough and sometimes it seems that the machine shuts off. She has not used her machine in the past couple of months.  Sister also reports that she had significant  medical issues and was hospitalized with the flu and pneumonia in December 2022.  She recuperated slowly.  She was found to be significantly anemic.  She has seen GI since then and also pulmonology.  She has not used her AutoPap but has not also not used her supplemental oxygen at night.  The patient's allergies, current medications, family history, past medical history, past social history, past surgical history and problem list were reviewed and updated as appropriate.    Previously (copied from previous notes for reference):    I saw her on 07/22/2016, at which time he was doing well on Requip low-dose. She saw Chelsea Givens, NP, in the interim 11/05/2017, at which time she was restarted on Requip.    I saw her on 09/12/2015 after her recent sleep study but talked about her results at the time. She reported no new issues. She had some intermittent restless legs symptoms, but we mutually agreed to monitor. She was on supplemental oxygen at night which I asked her to continue as she did not have any overt obstructive sleep apnea type changes during sleep study but lower trending oxygen saturations. Supplemental oxygen was not utilized for the study. She was trying to lose weight and lost a few pounds. I suggested a six-month recheck.   I first met her on 07/23/2015 at the request of her primary care physician, at which time patient reported a prior diagnosis of obstructive sleep apnea, on 8-10 years  ago but she was no longer on CPAP therapy and was using oxygen at night. I invited her back for sleep study. She had a diagnostic sleep study on 08/12/2015. I went over her test results with her in detail today. Sleep efficiency was reduced at 82.7% with a latency to sleep of 28 minutes and wake after sleep onset of 57 minutes with mild to moderate sleep fragmentation noted. She had an elevated arousal index. She had an increased percentage of light stage sleep, absence of slow-wave sleep and a decreased  percentage of REM sleep with a prolonged REM latency. She had moderate PLMS with an index of 34 per hour, resulting in 8 arousals per hour. She hadn't moderate snoring. AHI was 3.1 per hour in total, rising to 20 per hour during REM sleep. Average oxygen desaturation was 87%, nadir was 77%. Time below 90% saturation was 6 hours and 42 minutes, time below 88% saturation was 6 hours and 22 minutes. Study was conducted without supplemental oxygen, she uses oxygen at home. She was advised to continue with her home oxygen therapy.   07/23/2015: She was previously diagnosed with obstructive sleep apnea several years ago, maybe 8-10 years ago, but is no longer using CPAP. She uses oxygen at night. Prior sleep study results are not available for my review today. I reviewed your office note from 07/05/2015, which you kindly included. She had blood work on 07/05/15 , which per sister, were okay. She also has had symptoms of restless legs and sometimes has to walk around.   She has nocturia, about 2-3 times a night. She has occasional morning headaches, about 3 times a week, does not usually take medication.   She does not watch TV in bed.   She goes to bed around 8 PM and takes several medications at night. Rise time is around 6:30 AM. She has a cat, that sleeps in her bed.   She is divorced, no children.   She had an upper respiratory infection about 4 months ago and was placed on oxygen at the time. She currently uses this at night, 2 L/m. She has an appointment with Dr. Halford Chessman in pulmonology pending for June of this year. She has quit smoking many years ago, 1995. She quit drinking alcohol in 1990 and had a history of excessive alcohol use. Her father was an alcoholic. She has 1 sister, Chelsea [name], and patient lives with her sister and sister's husband. She has no biological children. She is retired from housekeeping at the hospital. She has a high school education. She is divorced. She drinks caffeine in the form of  coffee, 2 cups per day and occasional diet sodas. She moves a lot in her sleep. She would be willing to try CPAP again. Her main issue with CPAP was uncomfortable mask and pressure. She had a CPAP titration study on 07/01/2009 which I was able to review through your records: She had a titration up to 15 cm. Her AHI was reduced according to the report, sleep efficiency 97%, REM latency 209 minutes. She had no slow-wave sleep. A baseline sleep study report was not available for my review and was also not mentioned in results in the CPAP titration study which was interpreted by Dr. Brandon Melnick.   She had a tonsillectomy as a child. Her Epworth sleepiness score is 6 out of 24 today, her fatigue score is 60 out of 63. She sleeps with the head of bed elevated at 45 for perforans. She feels she can  breathe better at night like that. She has a hospital bed.  Her Past Medical History Is Significant For: Past Medical History:  Diagnosis Date   Anxiety    Arthritis    Bipolar disorder ([place])    COPD (chronic obstructive pulmonary disease) (HCC)    Diabetes mellitus type II    Esophageal stricture    moderate distal   GERD (gastroesophageal reflux disease)    Heart murmur    "HAD AN ECHO YEARS AGO- NOTHING TO BE CONCERNED ABOUT"   Hyperlipemia    Hypertension    Hypothyroidism    Mental disorder    Bipolar   Obesity    Oxygen desaturation during sleep    wears 2 liters at bedtime    Schizophrenia ([place])    patient reports that she is unaware of this   Shortness of breath    with a little activy   Sleep apnea    wears 2 liters of oxygen at bedtime instead of CPAP    Her Past Surgical History Is Significant For: Past Surgical History:  Procedure Laterality Date   BALLOON DILATION N/A 06/10/2016   Procedure: BALLOON DILATION;  Surgeon: Wilford Corner, MD;  Location: [place];  Service: Endoscopy;  Laterality: N/A;   BREAST EXCISIONAL BIOPSY Left 05/10/2012   benign   CHOLECYSTECTOMY      COLONOSCOPY WITH PROPOFOL N/A 01/30/2017   Procedure: COLONOSCOPY WITH PROPOFOL;  Surgeon: Wilford Corner, MD;  Location: [place];  Service: Endoscopy;  Laterality: N/A;   COLONOSCOPY WITH PROPOFOL N/A 09/29/2020   Procedure: COLONOSCOPY WITH PROPOFOL;  Surgeon: Clarene Essex, MD;  Location: [facility];  Service: Endoscopy;  Laterality: N/A;   ESOPHAGEAL MANOMETRY N/A 12/19/2020   Procedure: ESOPHAGEAL MANOMETRY (EM);  Surgeon: Wilford Corner, MD;  Location: [facility];  Service: Endoscopy;  Laterality: N/A;   ESOPHAGOGASTRODUODENOSCOPY N/A 06/03/2016   Procedure: ESOPHAGOGASTRODUODENOSCOPY (EGD);  Surgeon: Wilford Corner, MD;  Location: [facility] ENDOSCOPY;  Service: Endoscopy;  Laterality: N/A;   ESOPHAGOGASTRODUODENOSCOPY N/A 06/10/2016   Procedure: ESOPHAGOGASTRODUODENOSCOPY (EGD);  Surgeon: Wilford Corner, MD;  Location: [facility] ENDOSCOPY;  Service: Endoscopy;  Laterality: N/A;   ESOPHAGOGASTRODUODENOSCOPY (EGD) WITH PROPOFOL N/A 09/28/2020   Procedure: ESOPHAGOGASTRODUODENOSCOPY (EGD) WITH PROPOFOL;  Surgeon: Arta Silence, MD;  Location: [facility];  Service: Endoscopy;  Laterality: N/A;   EYE SURGERY     PARTIAL MASTECTOMY WITH NEEDLE LOCALIZATION Left 05/10/2012   Procedure: PARTIAL MASTECTOMY WITH NEEDLE LOCALIZATION;  Surgeon: Adin Hector, MD;  Location: [place];  Service: General;  Laterality: Left;   POLYPECTOMY  09/29/2020   Procedure: POLYPECTOMY;  Surgeon: Clarene Essex, MD;  Location: [facility];  Service: Endoscopy;;   TONSILLECTOMY      Her Family History Is Significant For: Family History  Problem Relation Age of Onset   Alzheimer's disease Mother    Alcohol abuse Father    Stroke Father    Sleep apnea Sister    Restless legs syndrome Neg Hx     Her Social History Is Significant For: Social History   Socioeconomic History   Marital status: Divorced    Spouse name: Not on file   Number of children: 0   Years of education: HS   Highest education level: Not on  file  Occupational History   Occupation: Retired   Tobacco Use   Smoking status: Former    Packs/day: 3.00    Years: 29.00    Pack years: 87.00    Types: Cigarettes    Quit date: 05/03/1993  Years since quitting: 28.3   Smokeless tobacco: Never  Vaping Use   Vaping Use: Never used  Substance and Sexual Activity   Alcohol use: No    Alcohol/week: 0.0 standard drinks   Drug use: No   Sexual activity: Not Currently  Other Topics Concern   Not on file  Social History Narrative   Drinks 2 cups of coffee a day   Social Determinants of Health   Financial Resource Strain: Not on file  Food Insecurity: Not on file  Transportation Needs: Not on file  Physical Activity: Not on file  Stress: Not on file  Social Connections: Not on file    Her Allergies Are:  Allergies  Allergen Reactions   Darvocet [Propoxyphene N-Acetaminophen] Anaphylaxis    Can take plain tylenol   Fluoxetine Other (See Comments)    Hallucinations    Penicillins Rash    REACTION: rash   Promethazine Hives  :   Her Current Medications Are:  Outpatient Encounter Medications as of 08/29/2021  Medication Sig   acetaminophen (TYLENOL) 325 MG tablet Take 650 mg by mouth every 6 (six) hours as needed for moderate pain.   albuterol (PROVENTIL) (2.5 MG/3ML) 0.083% nebulizer solution Take 2.5 mg by nebulization every 6 (six) hours as needed for wheezing or shortness of breath.   benzonatate (TESSALON) 200 MG capsule Take 1 capsule (200 mg total) by mouth 3 (three) times daily as needed for cough.   bumetanide (BUMEX) 1 MG tablet Take 1 tablet (1 mg total) by mouth daily.   chlorpheniramine-HYDROcodone (TUSSIONEX) 10-8 MG/5ML SUER Take 5 mLs by mouth every 12 (twelve) hours as needed for cough. cough   Cholecalciferol (VITAMIN D3) 5000 units TABS Take 5,000 Units by mouth daily.   clonazePAM (KLONOPIN) 1 MG tablet Take 1 tablet (1 mg total) by mouth at bedtime.   diclofenac sodium (VOLTAREN) 1 % GEL Apply 2 g  topically 4 (four) times daily as needed (pain).   Ensure Max Protein (ENSURE MAX PROTEIN) LIQD Take 330 mLs (11 oz total) by mouth 2 (two) times daily.   FEROSUL 325 (65 Fe) MG tablet Take 325 mg by mouth daily.   fluticasone furoate-vilanterol (BREO ELLIPTA) 200-25 MCG/INH AEPB Inhale 1 puff into the lungs every morning.   insulin aspart (NOVOLOG FLEXPEN) 100 UNIT/ML FlexPen Inject 2-12 Units into the skin 3 (three) times daily with meals. Sliding scale   LEVEMIR 100 UNIT/ML injection Inject 30 Units into the skin at bedtime.   levothyroxine (SYNTHROID) 125 MCG tablet Take 125 mcg by mouth daily before breakfast.   metFORMIN (GLUCOPHAGE) 500 MG tablet Take 500 mg by mouth 2 (two) times daily with a meal.   metoprolol tartrate (LOPRESSOR) 25 MG tablet Take 0.5 tablets (12.5 mg total) by mouth 2 (two) times daily.   montelukast (SINGULAIR) 10 MG tablet Take 10 mg by mouth daily.   Multiple Vitamin (MULTIVITAMIN WITH MINERALS) TABS tablet Take 1 tablet by mouth daily.   nystatin ointment (MYCOSTATIN) Apply 1 application topically 2 (two) times daily as needed (rash).   omeprazole (PRILOSEC) 40 MG capsule Take 40 mg by mouth daily.   ondansetron (ZOFRAN) 4 MG tablet Take 1 tablet (4 mg total) by mouth every 6 (six) hours as needed for nausea.   OZEMPIC, 1 MG/DOSE, 4 MG/3ML SOPN Inject 1 mg into the skin once a week. tuesdays   PARoxetine (PAXIL) 40 MG tablet Take 1 tablet (40 mg total) by mouth every morning.   polyethylene glycol (MIRALAX / GLYCOLAX)  17 g packet Take 17 g by mouth daily as needed for mild constipation.   rOPINIRole (REQUIP) 0.25 MG tablet TAKE 3 TABLETS BY MOUTH AT BEDTIME   rosuvastatin (CRESTOR) 5 MG tablet Take 5 mg by mouth daily.   traZODone (DESYREL) 100 MG tablet Take 1 tablet (100 mg total) by mouth at bedtime.   TRUEPLUS INSULIN SYRINGE 29G X 1/2" 1 ML MISC 1 each by Other route daily.   VENTOLIN HFA 108 (90 Base) MCG/ACT inhaler Inhale 1-2 puffs into the lungs every 4  (four) hours as needed for wheezing or shortness of breath.   ziprasidone (GEODON) 80 MG capsule Take 1 capsule (80 mg total) by mouth at bedtime.   No facility-administered encounter medications on file as of 08/29/2021.  :  Review of Systems:  Out of a complete 14 point review of systems, all are reviewed and negative with the exception of these symptoms as listed below:   Review of Systems  Neurological:        Pt is here for CPAP follow up   Pt  states she has not   done well with CPAP machine . Pt states she has switched her mask twice . Pt states current mask works well . Pt states she tries to use CPAP machine 4 hours a night    Objective:  Neurological Exam  Physical Exam Physical Examination:   Vitals:   08/29/21 1051  BP: (!) 124/58  Pulse: 67    General Examination: The patient is a very pleasant 71 y.o. female in no acute distress. She appears well-developed and well-nourished and well groomed.   HEENT: Normocephalic, atraumatic, pupils are equal, round and reactive to light, tracking is difficult for her.  Airway examination reveals moderate mouth dryness, moderate and moderate airway crowding.  Chest: Clear to auscultation without wheezing, rhonchi or crackles noted.   Heart: S1+S2+0, regular and normal without murmurs, rubs or gallops noted.    Abdomen: Soft, non-tender and non-distended.   Extremities: There is no pitting edema in the distal lower extremities bilaterally.    Skin: Warm and dry without trophic changes noted.    Musculoskeletal: exam reveals no obvious joint deformities.    Neurologically:   Mental status: The patient is awake, alert but unable to provide a detailed history.  Her history is primarily provided by her sister.  Mood is constricted and affect is blunted. Mild psychomotor retardation, stable. Cranial nerves II - XII are as described above under HEENT exam.  Motor exam: Normal bulk, strength and tone is noted. There is no obvious  tremor.  Fine motor and coordination: intact, slightly slow.  Cerebellar testing: No dysmetria or intention tremor. There is no truncal or gait ataxia.  Sensory exam: intact to light touch in the UEs and LEs.  Gait, station and balance: She stands with mild difficulty. No veering to one side is noted. No leaning to one side is noted. Posture is age-appropriate and stance is narrow based. Gait shows slow and cautious gait.     Assessment and Plan:  In summary, SANYA KOBRIN is a very pleasant 71 year old female with an underlying complex medical history of hypothyroidism, hypertension, type 2 diabetes, hyperlipidemia, anemia, obesity, mood disorder, including paranoid schizophrenia for which she is followed by mental health, vitamin D deficiency, allergic rhinitis, osteoarthritis, recent hospitalization for pneumonia, who presents for follow-up consultation of her obstructive sleep apnea.  Her home sleep test in October 2022 showed evidence of severe obstructive sleep apnea.  She has not been able to use her AutoPap machine, we will discontinue treatment as per her request at this time.  She was previously placed on supplemental oxygen and is advised to follow-up with her pulmonologist to see if she would qualify for nocturnal supplemental oxygen.  She has not been using her oxygen concentrator at night.  She can maintain treatment for restless leg syndrome on ropinirole and follow-up routinely to see one of our nurse practitioners in 6 months.  Unfortunately, she really is not a good candidate for any alternative treatment options for sleep apnea.  In the least, she may qualify for supplemental oxygen through pulmonology.  She is advised to make a follow-up appointment.  The patient and her sister were in agreement with this plan. I spent 30 minutes in total face-to-face time and in reviewing records during pre-charting, more than 50% of which was spent in counseling and coordination of care, reviewing test  results, reviewing medications and treatment regimen and/or in discussing or reviewing the diagnosis of OSA, the prognosis and treatment options. Pertinent laboratory and imaging test results that were available during this visit with the patient were reviewed by me and considered in my medical decision making (see chart for details).

## 2021-08-29 NOTE — Patient Instructions (Signed)
It was nice to see you both today. Unfortunately, you have not been able to use the autoPAP. I appreciate you trying AutoPap therapy. As discussed, we will discontinue AutoPap therapy at this time.  Please follow up with Megan in 6 month.  Please follow up with your lung doctor. You may qualify for supplemental oxygen at night.

## 2021-08-30 ENCOUNTER — Telehealth: Payer: Self-pay | Admitting: Emergency Medicine

## 2021-08-30 NOTE — Telephone Encounter (Signed)
Patient's sister states that Dr. Rexene Alberts was concerned about patient's oxygen levels dropping at night and mentioned patient's lung doctor ordering a sleep study.  Please advise, call back number is [phone], patient's sister, [name].

## 2021-08-30 NOTE — Telephone Encounter (Signed)
Spoke with the pt's sister  She states she went with pt to neuro appt- Dr Rexene Alberts  They checked a DL and turns out pt has not been using her CPAP at all over the past 2 months  She states Dr Rexene Alberts suggested that she do ONO on RA to see if she qualifies for o2 with sleep, as she might use this more reliably  Dr Lamonte Sakai- please advise thanks!

## 2021-08-31 NOTE — Telephone Encounter (Signed)
She had been doing well, wearing her CPAP the last time I saw her in February.  I think before committing to changing to nasal cannula oxygen we should meet and discuss ways that we might be able to improve her CPAP compliance.  If she is convinced that she cannot restart wearing it then we could consider doing an overnight oximetry

## 2021-09-03 NOTE — Telephone Encounter (Signed)
Called patient and her sister and got patient to be seen by [facility]   09/09/2021 9am  I did not want patient waiting until July to be seen by Dr Lamonte Sakai for her oxygen needs or her cpap.   Nothing further needed at this time

## 2021-09-06 ENCOUNTER — Encounter: Payer: Self-pay | Admitting: Podiatry

## 2021-09-06 ENCOUNTER — Ambulatory Visit (INDEPENDENT_AMBULATORY_CARE_PROVIDER_SITE_OTHER): Payer: Medicare Other | Admitting: Podiatry

## 2021-09-06 DIAGNOSIS — B351 Tinea unguium: Secondary | ICD-10-CM

## 2021-09-06 DIAGNOSIS — M79675 Pain in left toe(s): Secondary | ICD-10-CM

## 2021-09-06 DIAGNOSIS — M79674 Pain in right toe(s): Secondary | ICD-10-CM

## 2021-09-06 DIAGNOSIS — E119 Type 2 diabetes mellitus without complications: Secondary | ICD-10-CM | POA: Diagnosis not present

## 2021-09-06 NOTE — Progress Notes (Signed)
This patient returns to my office for at risk foot care.  This patient requires this care by a professional since this patient will be at risk due to having diabetes.  This patient is unable to cut nails herself since the patient cannot reach her nails.These nails are painful walking and wearing shoes.  This patient presents for at risk foot care today.  General Appearance  Alert, conversant and in no acute stress.  Vascular  Dorsalis pedis  are palpable  Bilaterally. Posterior tibial pulses are not palpable  B/L.  Capillary return is within normal limits  Bilaterally.  Cold feet.  Bilaterally. Absent hair.  Neurologic  Senn-Weinstein monofilament wire test within normal limits  bilaterally. Muscle power within normal limits bilaterally.  Nails Thick disfigured discolored nails with subungual debris  Hallux nails bilaterally. No evidence of bacterial infection or drainage bilaterally.  Orthopedic  No limitations of motion  feet .  No crepitus or effusions noted.  No bony pathology or digital deformities noted.  Skin  normotropic skin with no porokeratosis noted bilaterally.  No signs of infections or ulcers noted.     Onychomycosis  Pain in right toe  Pain in left toe.  Consent was obtained for treatment procedures.  Debridement and grinding of long thick nails with clearing of subungual debris.  No infection or ulcer.     Return office visit   6 months.       Told patient to return for periodic foot care and evaluation due to potential at risk complications.   Gardiner Barefoot DPM

## 2021-09-09 ENCOUNTER — Encounter: Payer: Self-pay | Admitting: Nurse Practitioner

## 2021-09-09 ENCOUNTER — Ambulatory Visit (INDEPENDENT_AMBULATORY_CARE_PROVIDER_SITE_OTHER): Payer: Medicare Other | Admitting: Nurse Practitioner

## 2021-09-09 DIAGNOSIS — J449 Chronic obstructive pulmonary disease, unspecified: Secondary | ICD-10-CM | POA: Diagnosis not present

## 2021-09-09 DIAGNOSIS — G4733 Obstructive sleep apnea (adult) (pediatric): Secondary | ICD-10-CM

## 2021-09-09 DIAGNOSIS — J9611 Chronic respiratory failure with hypoxia: Secondary | ICD-10-CM | POA: Diagnosis not present

## 2021-09-09 NOTE — Progress Notes (Signed)
$'@Patient'q$  ID: Chelsea [name], female    DOB: Aug 19, 1950, 71 y.o.   MRN: [id number]  Chief Complaint  Patient presents with   Follow-up    Pt states she has been doing okay since last visit and denies any complaints other than having problems with her CPAP.    Referring provider: Darrol Jump, NP  HPI: 71 year old female, former smoker followed for COPD, chronic respiratory failure and OSA on CPAP.  She is a patient of Dr. Agustina Caroli and last seen in office on 05/09/2021. Past medical history significant for HTN, LVH, CHF, allergic rhinitis, DM II, hypothyroidism, CKD, obesity, bipolar, insomnia, anxiety, HLD.   TEST/EVENTS:  12/31/2020 PSG: AHI 43.1. Severe OSA 05/09/2021 PFTs: FVC 92, FEV1 89, ratio 76, TLC 101, DLCOcor 81  05/09/2021: OV with Dr. Lamonte Sakai after PFTs. Mild obstruction on PFTs prior to visit. Doing well on Breo - no changes. Reported wearing CPAP nightly; felt better after cahnge to full face mask. Better daytime energy.   09/09/2021: Today - follow up Patient presents today with sister to discuss issues with her CPAP. Her sister had contacted the office the beginning of this because the patient's neurologist was worried about her oxygen levels dropping at night and curious if she should do an ONO on room air to see if she qualifies for nocturnal oxygen. Upon review, she had not been using her CPAP over the past two months so she was scheduled to come in to discuss further. Today, she reports that she stopped wearing her CPAP because she felt like it would start and stop blowing frequently over night. Aside from this, she had no other complaints. Felt like the full face mask fit her well and was more comfortable. Her sister stated that she is sleeping more during the day now than she was previously. She denies any morning headaches or frequent night awakenings. Reports her breathing as stable and does well on her Breo. Doesn't have to use her rescue very often.   Allergies  Allergen  Reactions   Darvocet [Propoxyphene N-Acetaminophen] Anaphylaxis    Can take plain tylenol   Fluoxetine Other (See Comments)    Hallucinations    Penicillins Rash    REACTION: rash   Promethazine Hives    Immunization History  Administered Date(s) Administered   Influenza Split 01/16/2012   Influenza,inj,quad, With Preservative 01/15/2021   Influenza-Unspecified 12/30/2014, 10/29/2017   PFIZER(Purple Top)SARS-COV-2 Vaccination 05/26/2019, 06/21/2019   Pneumococcal Polysaccharide-23 10/17/2011   Tdap 08/15/2011    Past Medical History:  Diagnosis Date   Anxiety    Arthritis    Bipolar disorder (HCC)    COPD (chronic obstructive pulmonary disease) ([place])    Diabetes mellitus type II    Esophageal stricture    moderate distal   GERD (gastroesophageal reflux disease)    Heart murmur    "HAD AN ECHO YEARS AGO- NOTHING TO BE CONCERNED ABOUT"   Hyperlipemia    Hypertension    Hypothyroidism    Mental disorder    Bipolar   Obesity    Oxygen desaturation during sleep    wears 2 liters at bedtime    Schizophrenia ([place])    patient reports that she is unaware of this   Shortness of breath    with a little activy   Sleep apnea    wears 2 liters of oxygen at bedtime instead of CPAP    Tobacco History: Social History   Tobacco Use  Smoking Status Former   Packs/day: 3.00  Years: 29.00   Total pack years: 87.00   Types: Cigarettes   Quit date: 05/03/1993   Years since quitting: 28.3  Smokeless Tobacco Never   Counseling given: Not Answered   Outpatient Medications Prior to Visit  Medication Sig Dispense Refill   acetaminophen (TYLENOL) 325 MG tablet Take 650 mg by mouth every 6 (six) hours as needed for moderate pain.     albuterol (PROVENTIL) (2.5 MG/3ML) 0.083% nebulizer solution Take 2.5 mg by nebulization every 6 (six) hours as needed for wheezing or shortness of breath.     benzonatate (TESSALON) 200 MG capsule Take 1 capsule (200 mg total) by mouth 3 (three)  times daily as needed for cough. 20 capsule 0   bumetanide (BUMEX) 1 MG tablet Take 1 tablet (1 mg total) by mouth daily. 30 tablet 0   chlorpheniramine-HYDROcodone (TUSSIONEX) 10-8 MG/5ML SUER Take 5 mLs by mouth every 12 (twelve) hours as needed for cough. cough     Cholecalciferol (VITAMIN D3) 5000 units TABS Take 5,000 Units by mouth daily.     clonazePAM (KLONOPIN) 1 MG tablet Take 1 tablet (1 mg total) by mouth at bedtime. 30 tablet 2   diclofenac sodium (VOLTAREN) 1 % GEL Apply 2 g topically 4 (four) times daily as needed (pain).     Ensure Max Protein (ENSURE MAX PROTEIN) LIQD Take 330 mLs (11 oz total) by mouth 2 (two) times daily. 3300 mL 0   FEROSUL 325 (65 Fe) MG tablet Take 325 mg by mouth daily.     fluticasone furoate-vilanterol (BREO ELLIPTA) 200-25 MCG/INH AEPB Inhale 1 puff into the lungs every morning.     insulin aspart (NOVOLOG FLEXPEN) 100 UNIT/ML FlexPen Inject 2-12 Units into the skin 3 (three) times daily with meals. Sliding scale     LEVEMIR 100 UNIT/ML injection Inject 30 Units into the skin at bedtime.     levothyroxine (SYNTHROID) 125 MCG tablet Take 125 mcg by mouth daily before breakfast.     metFORMIN (GLUCOPHAGE) 500 MG tablet Take 500 mg by mouth 2 (two) times daily with a meal.     metoprolol tartrate (LOPRESSOR) 25 MG tablet Take 0.5 tablets (12.5 mg total) by mouth 2 (two) times daily. 60 tablet 0   montelukast (SINGULAIR) 10 MG tablet Take 10 mg by mouth daily.     Multiple Vitamin (MULTIVITAMIN WITH MINERALS) TABS tablet Take 1 tablet by mouth daily. 30 tablet 0   nystatin ointment (MYCOSTATIN) Apply 1 application topically 2 (two) times daily as needed (rash).     omeprazole (PRILOSEC) 40 MG capsule Take 40 mg by mouth daily.     ondansetron (ZOFRAN) 4 MG tablet Take 1 tablet (4 mg total) by mouth every 6 (six) hours as needed for nausea. 20 tablet 0   OZEMPIC, 1 MG/DOSE, 4 MG/3ML SOPN Inject 1 mg into the skin once a week. tuesdays     PARoxetine (PAXIL)  40 MG tablet Take 1 tablet (40 mg total) by mouth every morning. 90 tablet 0   polyethylene glycol (MIRALAX / GLYCOLAX) 17 g packet Take 17 g by mouth daily as needed for mild constipation. 14 each 0   rOPINIRole (REQUIP) 0.25 MG tablet TAKE 3 TABLETS BY MOUTH AT BEDTIME 90 tablet 5   rosuvastatin (CRESTOR) 5 MG tablet Take 5 mg by mouth daily.     traZODone (DESYREL) 100 MG tablet Take 1 tablet (100 mg total) by mouth at bedtime. 90 tablet 0   TRUEPLUS INSULIN SYRINGE 29G X 1/2"  1 ML MISC 1 each by Other route daily.     VENTOLIN HFA 108 (90 Base) MCG/ACT inhaler Inhale 1-2 puffs into the lungs every 4 (four) hours as needed for wheezing or shortness of breath.     ziprasidone (GEODON) 80 MG capsule Take 1 capsule (80 mg total) by mouth at bedtime. 90 capsule 0   No facility-administered medications prior to visit.     Review of Systems:   Constitutional: No weight loss or gain, night sweats, fevers, chills. +excessive daytime fatigue HEENT: No headaches, difficulty swallowing, tooth/dental problems, or sore throat. No sneezing, itching, ear ache, nasal congestion, or post nasal drip CV:  No chest pain, orthopnea, PND, swelling in lower extremities, anasarca, dizziness, palpitations, syncope Resp: No shortness of breath with exertion or at rest. No excess mucus or change in color of mucus. No productive or non-productive. No hemoptysis. No wheezing.  No chest wall deformity GI:  No heartburn, indigestion, abdominal pain, nausea, vomiting, diarrhea, change in bowel habits, loss of appetite, bloody stools.  Skin: No rash, lesions, ulcerations MSK:  No joint pain or swelling.  No decreased range of motion.  No back pain. Neuro: No dizziness or lightheadedness.  Psych: No depression or anxiety. Mood stable.     Physical Exam:  BP 122/64 (BP Location: Right Wrist, Patient Position: Sitting, Cuff Size: Normal)   Pulse 66   Temp 97.6 F (36.4 C) (Oral)   Ht '5\' 2"'$  (1.575 m)   Wt 193 lb 6.4  oz (87.7 kg)   SpO2 96% Comment: RA  BMI 35.37 kg/m   GEN: Pleasant, interactive, well-appearing; in no acute distress. HEENT:  Normocephalic and atraumatic. PERRLA. Sclera white. Nasal turbinates pink, moist and patent bilaterally. No rhinorrhea present. Oropharynx pink and moist, without exudate or edema. No lesions, ulcerations, or postnasal drip.  NECK:  Supple w/ fair ROM. No JVD present. Normal carotid impulses w/o bruits. Thyroid symmetrical with no goiter or nodules palpated. No lymphadenopathy.   CV: RRR, no m/r/g, no peripheral edema. Pulses intact, +2 bilaterally. No cyanosis, pallor or clubbing. PULMONARY:  Unlabored, regular breathing. Clear bilaterally A&P w/o wheezes/rales/rhonchi. No accessory muscle use. No dullness to percussion. GI: BS present and normoactive. Soft, non-tender to palpation. No organomegaly or masses detected. No CVA tenderness. MSK: No erythema, warmth or tenderness. Cap refil <2 sec all extrem. No deformities or joint swelling noted.  Neuro: A/Ox3. No focal deficits noted.   Skin: Warm, no lesions or rashe Psych: Normal affect and behavior. Judgement and thought content appropriate.     Lab Results:  CBC    Component Value Date/Time   WBC 9.7 03/23/2021 0428   RBC 3.13 (L) 03/23/2021 0428   HGB 7.9 (L) 03/23/2021 0428   HCT 25.9 (L) 03/23/2021 0428   PLT 486 (H) 03/23/2021 0428   MCV 82.7 03/23/2021 0428   MCH 25.2 (L) 03/23/2021 0428   MCHC 30.5 03/23/2021 0428   RDW 13.2 03/23/2021 0428   LYMPHSABS 2.8 03/23/2021 0428   MONOABS 1.0 03/23/2021 0428   EOSABS 0.0 03/23/2021 0428   BASOSABS 0.0 03/23/2021 0428    BMET    Component Value Date/Time   NA 140 03/23/2021 0428   K 4.1 03/23/2021 0428   CL 104 03/23/2021 0428   CO2 30 03/23/2021 0428   GLUCOSE 85 03/23/2021 0428   BUN 52 (H) 03/23/2021 0428   CREATININE 1.46 (H) 03/23/2021 0428   CALCIUM 8.8 (L) 03/23/2021 0428   GFRNONAA 39 (L) 03/23/2021 0277  GFRAA 38 (L) 06/02/2016  2343    BNP    Component Value Date/Time   BNP 59.3 03/19/2021 1315     Imaging:  No results found.       Latest Ref Rng & Units 05/09/2021   10:07 AM  PFT Results  FVC-Pre L 2.73   FVC-Predicted Pre % 92   FVC-Post L 2.62   FVC-Predicted Post % 89   Pre FEV1/FVC % % 73   Post FEV1/FCV % % 76   FEV1-Pre L 2.00   FEV1-Predicted Pre % 89   FEV1-Post L 2.00   DLCO uncorrected ml/min/mmHg 11.98   DLCO UNC% % 63   DLCO corrected ml/min/mmHg 15.41   DLCO COR %Predicted % 81   DLVA Predicted % 85   TLC L 5.00   TLC % Predicted % 101   RV % Predicted % 110     No results found for: "NITRICOXIDE"      Assessment & Plan:   OSA (obstructive sleep apnea) Poorly controlled; has been off CPAP for a few months now. Felt as though the machine was not working properly and not getting enough air at times. Attempting to get a download from DME today so we can review prior use and see if there is an identifiable problem that we can address. Strongly encouraged her to restart CPAP therapy - pt agreeable to this. Advised that if she continues to have issues with the machine, to take it to [facility] for troubleshooting. If machine is in good working condition, can consider CPAP titration to see if she may need BiPAP therapy.  Patient Instructions  Continue Breo 1 puff daily.  Brush tongue and rinse mouth afterwards Continue Albuterol inhaler 2 puffs or 3 mL neb every 6 hours as needed for shortness of breath or wheezing. Notify if symptoms persist despite rescue inhaler/neb use. Continue singulair 10 mg At bedtime  Continue omeprazole 40 mg daily  Restart wearing CPAP nightly, minimum of 4-6 hours a night. If you still feel like your machine stops blowing throughout the night, please take it to [facility] so they can troubleshoot it.   We discussed how untreated sleep apnea puts an individual at risk for cardiac arrhthymias, pulm HTN, DM, stroke and increases their risk for daytime accidents.    Follow up in 4-6 weeks for CPAP compliance check with Dr. Lamonte Sakai or Katie Naheim Burgen,NP. If symptoms do not improve or worsen, please contact office for sooner follow up or seek emergency care.     COPD (chronic obstructive pulmonary disease) ([place]) Compensated on current regimen. Continue ICS/LABA therapy and PRN albuterol.  Chronic respiratory failure with hypoxia (HCC) Oxygen saturations 96% on room air in office; not currently requiring supplemental O2. Her neurologist was concerned for possible nocturnal hypoxemia. She has severe OSA but has been off of CPAP for the past few months. Advised she restart therapy ASAP so we can assess control of her OSA on current settings; may need to consider CPAP titration if she continues to have difficulties.    I spent 35 minutes of dedicated to the care of this patient on the date of this encounter to include pre-visit review of records, face-to-face time with the patient discussing conditions above, post visit ordering of testing, clinical documentation with the electronic health record, making appropriate referrals as documented, and communicating necessary findings to members of the patients care team.  Clayton Bibles, NP 09/09/2021  Pt aware and understands NP's role.

## 2021-09-09 NOTE — Assessment & Plan Note (Signed)
Compensated on current regimen. Continue ICS/LABA therapy and PRN albuterol.

## 2021-09-09 NOTE — Patient Instructions (Addendum)
Continue Breo 1 puff daily.  Brush tongue and rinse mouth afterwards Continue Albuterol inhaler 2 puffs or 3 mL neb every 6 hours as needed for shortness of breath or wheezing. Notify if symptoms persist despite rescue inhaler/neb use. Continue singulair 10 mg At bedtime  Continue omeprazole 40 mg daily  Restart wearing CPAP nightly, minimum of 4-6 hours a night. If you still feel like your machine stops blowing throughout the night, please take it to [facility] so they can troubleshoot it.   We discussed how untreated sleep apnea puts an individual at risk for cardiac arrhthymias, pulm HTN, DM, stroke and increases their risk for daytime accidents.   Follow up in 4-6 weeks for CPAP compliance check with Dr. Lamonte Sakai or Katie Mouhamed Glassco,NP. If symptoms do not improve or worsen, please contact office for sooner follow up or seek emergency care.

## 2021-09-09 NOTE — Assessment & Plan Note (Signed)
Oxygen saturations 96% on room air in office; not currently requiring supplemental O2. Her neurologist was concerned for possible nocturnal hypoxemia. She has severe OSA but has been off of CPAP for the past few months. Advised she restart therapy ASAP so we can assess control of her OSA on current settings; may need to consider CPAP titration if she continues to have difficulties.

## 2021-09-09 NOTE — Assessment & Plan Note (Signed)
Poorly controlled; has been off CPAP for a few months now. Felt as though the machine was not working properly and not getting enough air at times. Attempting to get a download from DME today so we can review prior use and see if there is an identifiable problem that we can address. Strongly encouraged her to restart CPAP therapy - pt agreeable to this. Advised that if she continues to have issues with the machine, to take it to [facility] for troubleshooting. If machine is in good working condition, can consider CPAP titration to see if she may need BiPAP therapy.  Patient Instructions  Continue Breo 1 puff daily.  Brush tongue and rinse mouth afterwards Continue Albuterol inhaler 2 puffs or 3 mL neb every 6 hours as needed for shortness of breath or wheezing. Notify if symptoms persist despite rescue inhaler/neb use. Continue singulair 10 mg At bedtime  Continue omeprazole 40 mg daily  Restart wearing CPAP nightly, minimum of 4-6 hours a night. If you still feel like your machine stops blowing throughout the night, please take it to [facility] so they can troubleshoot it.   We discussed how untreated sleep apnea puts an individual at risk for cardiac arrhthymias, pulm HTN, DM, stroke and increases their risk for daytime accidents.   Follow up in 4-6 weeks for CPAP compliance check with Dr. Lamonte Sakai or Katie Edword Cu,NP. If symptoms do not improve or worsen, please contact office for sooner follow up or seek emergency care.

## 2021-09-19 ENCOUNTER — Telehealth (HOSPITAL_BASED_OUTPATIENT_CLINIC_OR_DEPARTMENT_OTHER): Payer: Medicare Other | Admitting: Psychiatry

## 2021-09-19 ENCOUNTER — Encounter (HOSPITAL_COMMUNITY): Payer: Self-pay | Admitting: Psychiatry

## 2021-09-19 DIAGNOSIS — F200 Paranoid schizophrenia: Secondary | ICD-10-CM

## 2021-09-19 DIAGNOSIS — F411 Generalized anxiety disorder: Secondary | ICD-10-CM | POA: Diagnosis not present

## 2021-09-19 MED ORDER — TRAZODONE HCL 100 MG PO TABS: 100.0000 mg | ORAL_TABLET | Freq: Every day | ORAL | 0 refills | Status: DC

## 2021-09-19 MED ORDER — ZIPRASIDONE HCL 80 MG PO CAPS: 80.0000 mg | ORAL_CAPSULE | Freq: Every day | ORAL | 0 refills | Status: DC

## 2021-09-19 MED ORDER — CLONAZEPAM 1 MG PO TABS: 1.0000 mg | ORAL_TABLET | Freq: Every day | ORAL | 2 refills | Status: DC

## 2021-09-19 MED ORDER — PAROXETINE HCL 40 MG PO TABS: 40.0000 mg | ORAL_TABLET | Freq: Every morning | ORAL | 0 refills | Status: DC

## 2021-09-19 NOTE — Progress Notes (Signed)
Virtual Visit via Telephone Note  I connected with Chelsea [name] on 09/19/21 at 10:00 AM EDT by telephone and verified that I am speaking with the correct person using two identifiers.  Location: Patient: Home Provider: Home Office   I discussed the limitations, risks, security and privacy concerns of performing an evaluation and management service by telephone and the availability of in person appointments. I also discussed with the patient that there may be a patient responsible charge related to this service. The patient expressed understanding and agreed to proceed.   History of Present Illness: Patient is evaluated by phone session.  She is taking all her medication as prescribed.  Her breathing is much improved and she started walking nearby and going to mailbox.  She is sleeping okay with the CPAP.  She denies any crying spells or any feeling of hopelessness.  She denies any paranoia, hallucination or any panic attacks.  She is recently seen neurology for apnea.  Her diabetes and chronic health issues are now managed by [facility] and usually she gets visit from their provider.  Her last hemoglobin A1c is 7.5 which was done on June 14.  She admitted few pounds weight gain but trying to watch her calorie intake.  She has no tremors, shakes or any EPS.  She tried to remain active and no issues with ADL however sometimes she is forgetful and does not remember things very well.  She gets medication in a pill pack from her pharmacy.  She reported all her medication is working well.  Recently her Crestor dose increased by her primary care physician.  She denies drinking or using any illegal substances.  She is getting along with her sister better.   Past Psychiatric History: H/O inpatient at [facility] and [facility] for mania and psychosis.  No h/o suicidal attempt.    Psychiatric Specialty Exam: Physical Exam  Review of Systems  Weight 190 lb 3.2 oz (86.3 kg).There is no height or  weight on file to calculate BMI.  General Appearance: NA  Eye Contact:  NA  Speech:  Slow  Volume:  Decreased  Mood:  Euthymic  Affect:  NA  Thought Process:  Descriptions of Associations: Intact  Orientation:  Full (Time, Place, and Person)  Thought Content:  WDL  Suicidal Thoughts:  No  Homicidal Thoughts:  No  Memory:  Immediate;   Fair Recent;   Fair Remote;   Fair  Judgement:  Fair  Insight:  Fair  Psychomotor Activity:  NA  Concentration:  Concentration: Fair and Attention Span: Fair  Recall:  [employer] of Knowledge:  Fair  Language:  Fair  Akathisia:  No  Handed:  Right  AIMS (if indicated):     Assets:  Communication Skills Desire for Improvement Housing  ADL's:  Intact  Cognition:  WNL  Sleep:   ok with CPAP      Assessment and Plan: Schizophrenia chronic paranoid type.  Generalized anxiety disorder.  I reviewed blood work results.  Her last hemoglobin A1c is 7.5 which was done on March 14.  She is seeing [facility] and getting home visits.  Discussed medication side effects and benefits.  Encourage try to walk and watch her calorie intake.  Patient does not have any issues with the medication.  We will continue Klonopin 1 mg at bedtime, trazodone 100 mg at bedtime, Paxil 40 mg daily and Geodon 80 mg at bedtime.  We talked about pharmacy and we  will consider reducing the medication on her next visit.  Recommended to call us back if she has any question or any concern.  Follow-up in 3 months.  Follow Up Instructions:    I discussed the assessment and treatment plan with the patient. The patient was provided an opportunity to ask questions and all were answered. The patient agreed with the plan and demonstrated an understanding of the instructions.   The patient was advised to call back or seek an in-person evaluation if the symptoms worsen or if the condition fails to improve as anticipated. Collaboration of Care: Primary Care Provider AEB  notes are available in epic to review.  Patient/Guardian was advised Release of Information must be obtained prior to any record release in order to collaborate their care with an outside provider. Patient/Guardian was advised if they have not already done so to contact the registration department to sign all necessary forms in order for [place] to release information regarding their care.   Consent: Patient/Guardian gives verbal consent for treatment and assignment of benefits for services provided during this visit. Patient/Guardian expressed understanding and agreed to proceed.    I provided 20 minutes of non-face-to-face time during this encounter.   Kathlee Nations, MD

## 2021-10-07 ENCOUNTER — Ambulatory Visit (INDEPENDENT_AMBULATORY_CARE_PROVIDER_SITE_OTHER): Payer: Medicare Other | Admitting: Nurse Practitioner

## 2021-10-07 ENCOUNTER — Encounter: Payer: Self-pay | Admitting: Nurse Practitioner

## 2021-10-07 VITALS — BP 120/76 | HR 67 | Temp 97.6°F | Ht 62.0 in | Wt 193.0 lb

## 2021-10-07 DIAGNOSIS — J9611 Chronic respiratory failure with hypoxia: Secondary | ICD-10-CM | POA: Diagnosis not present

## 2021-10-07 DIAGNOSIS — J449 Chronic obstructive pulmonary disease, unspecified: Secondary | ICD-10-CM

## 2021-10-07 DIAGNOSIS — G4733 Obstructive sleep apnea (adult) (pediatric): Secondary | ICD-10-CM

## 2021-10-07 NOTE — Assessment & Plan Note (Signed)
Stable on room air with O2 sats 97% in office. Past sleep study with nocturnal hypoxemia, likely related to OSA. She never underwent CPAP titration to ensure this was corrected with CPAP therapy. We have ordered this today.

## 2021-10-07 NOTE — Assessment & Plan Note (Addendum)
Previously was not using CPAP due to difficulties with machine. She has restarted therapy since our last OV but we are unable to see a download. We did verify with [facility] that she is using the machine and they can see this on their end; however, her remote download is not pulling any info since before our last visit. She reports that she is still having difficulties with the machine; recommended she take it to [facility] for troubleshooting to ensure the machine is functioning properly. She has severe OSA and associated hypoxia on previous HST so I am concerned that CPAP is not controlling her. Recommended we move forward with CPAP titration. She is concerned she may not be able to sleep during study; advised she take home medications with her. She was agreeable with this plan.  Patient Instructions  Continue Breo 1 puff daily.  Brush tongue and rinse mouth afterwards Continue Albuterol inhaler 2 puffs or 3 mL neb every 6 hours as needed for shortness of breath or wheezing. Notify if symptoms persist despite rescue inhaler/neb use. Continue singulair 10 mg At bedtime  Continue omeprazole 40 mg daily   Restart wearing CPAP nightly, minimum of 4-6 hours a night. Take your machine to [facility] for troubleshooting. We will move forward with CPAP titration for further evaluation    We discussed how untreated sleep apnea puts an individual at risk for cardiac arrhthymias, pulm HTN, DM, stroke and increases their risk for daytime accidents.    Follow up after CPAP titration with Dr. Lamonte Sakai or Katie Calissa Swenor,NP. If symptoms do not improve or worsen, please contact office for sooner follow up or seek emergency care

## 2021-10-07 NOTE — Assessment & Plan Note (Signed)
Compensated on current regimen. Continue ICS/LABA with Breo and PRN albuterol.

## 2021-10-07 NOTE — Progress Notes (Signed)
$'@Patient'C$  ID: Chelsea [name], female    DOB: Dec 18, 1950, 71 y.o.   MRN: [id number]  Chief Complaint  Patient presents with   Follow-up    She is doing well and able to wear it better at night.     Referring provider: Darrol Jump, NP  HPI: 71 year old female, former smoker followed for COPD, chronic respiratory failure and OSA on CPAP.  She is a patient of Dr. Agustina Caroli and last seen in office on 09/09/2021. Past medical history significant for HTN, LVH, CHF, allergic rhinitis, DM II, hypothyroidism, CKD, obesity, bipolar, insomnia, anxiety, HLD.   TEST/EVENTS:  12/31/2020 PSG: AHI 43.1. Severe OSA 05/09/2021 PFTs: FVC 92, FEV1 89, ratio 76, TLC 101, DLCOcor 81  05/09/2021: OV with Dr. Lamonte Sakai after PFTs. Mild obstruction on PFTs prior to visit. Doing well on Breo - no changes. Reported wearing CPAP nightly; felt better after cahnge to full face mask. Better daytime energy.   09/09/2021: OV with Safia Panzer NP to discuss issues with her CPAP. Her sister had contacted the office the beginning of this because the patient's neurologist was worried about her oxygen levels dropping at night and curious if she should do an ONO on room air to see if she qualifies for nocturnal oxygen. Upon review, she had not been using her CPAP over the past two months so she was scheduled to come in to discuss further. Today, she reports that she stopped wearing her CPAP because she felt like it would start and stop blowing frequently over night. Aside from this, she had no other complaints. Felt like the full face mask fit her well and was more comfortable. Her sister stated that she is sleeping more during the day now than she was previously. Poorly controlled; has been off CPAP for a few months now. Felt as though the machine was not working properly and not getting enough air at times. Attempting to get a download from DME today so we can review prior use and see if there is an identifiable problem that we can address.  Strongly encouraged her to restart CPAP therapy - pt agreeable to this. Advised that if she continues to have issues with the machine, to take it to [facility] for troubleshooting. If machine is in good working condition, can consider CPAP titration to see if she may need BiPAP therapy. COPD compensated on current regimen with Breo.   10/07/2021: Today - follow up Patient presents today with sister for follow up. Last time she was here, she was not using her CPAP consistently due to trouble with the machine. She reports that she has restarted therapy and is using it nightly; however, she is still having problems with the machine. She feels like it stops blowing, which wakes her up, and then she has to press on her mask to get it to start again. Still feeling like she has some daytime fatigue, but this is some improved. She didn't take her machine to [facility] after our last visit. Otherwise, she has no other concerns or complaints today. Breathing is stable on Breo.  Allergies  Allergen Reactions   Darvocet [Propoxyphene N-Acetaminophen] Anaphylaxis    Can take plain tylenol   Fluoxetine Other (See Comments)    Hallucinations    Penicillins Rash    REACTION: rash   Promethazine Hives    Immunization History  Administered Date(s) Administered   Influenza Split 01/16/2012   Influenza,inj,quad, With Preservative 01/15/2021   Influenza-Unspecified 12/13/2010, 12/30/2014, 10/29/2017   PFIZER(Purple Top)SARS-COV-2 Vaccination 05/26/2019,  06/21/2019   Pneumococcal Polysaccharide-23 10/17/2011   Tdap 08/15/2011    Past Medical History:  Diagnosis Date   Anxiety    Arthritis    Bipolar disorder (HCC)    COPD (chronic obstructive pulmonary disease) (HCC)    Diabetes mellitus type II    Esophageal stricture    moderate distal   GERD (gastroesophageal reflux disease)    Heart murmur    "HAD AN ECHO YEARS AGO- NOTHING TO BE CONCERNED ABOUT"   Hyperlipemia    Hypertension    Hypothyroidism    Mental  disorder    Bipolar   Obesity    Oxygen desaturation during sleep    wears 2 liters at bedtime    Schizophrenia ([place])    patient reports that she is unaware of this   Shortness of breath    with a little activy   Sleep apnea    wears 2 liters of oxygen at bedtime instead of CPAP    Tobacco History: Social History   Tobacco Use  Smoking Status Former   Packs/day: 3.00   Years: 29.00   Total pack years: 87.00   Types: Cigarettes   Quit date: 05/03/1993   Years since quitting: 28.4  Smokeless Tobacco Never   Counseling given: Not Answered   Outpatient Medications Prior to Visit  Medication Sig Dispense Refill   acetaminophen (TYLENOL) 325 MG tablet Take 650 mg by mouth every 6 (six) hours as needed for moderate pain.     albuterol (PROVENTIL) (2.5 MG/3ML) 0.083% nebulizer solution Take 2.5 mg by nebulization every 6 (six) hours as needed for wheezing or shortness of breath.     bumetanide (BUMEX) 1 MG tablet Take 1 tablet (1 mg total) by mouth daily. 30 tablet 0   chlorpheniramine-HYDROcodone (TUSSIONEX) 10-8 MG/5ML SUER Take 5 mLs by mouth every 12 (twelve) hours as needed for cough. cough     Cholecalciferol (VITAMIN D3) 5000 units TABS Take 5,000 Units by mouth daily.     clonazePAM (KLONOPIN) 1 MG tablet Take 1 tablet (1 mg total) by mouth at bedtime. 30 tablet 2   diclofenac sodium (VOLTAREN) 1 % GEL Apply 2 g topically 4 (four) times daily as needed (pain).     Ensure Max Protein (ENSURE MAX PROTEIN) LIQD Take 330 mLs (11 oz total) by mouth 2 (two) times daily. 3300 mL 0   FEROSUL 325 (65 Fe) MG tablet Take 325 mg by mouth daily.     fluticasone furoate-vilanterol (BREO ELLIPTA) 200-25 MCG/INH AEPB Inhale 1 puff into the lungs every morning.     insulin aspart (NOVOLOG FLEXPEN) 100 UNIT/ML FlexPen Inject 2-12 Units into the skin 3 (three) times daily with meals. Sliding scale     LEVEMIR 100 UNIT/ML injection Inject 30 Units into the skin at bedtime.     levothyroxine  (SYNTHROID) 125 MCG tablet Take 125 mcg by mouth daily before breakfast.     metFORMIN (GLUCOPHAGE) 500 MG tablet Take 500 mg by mouth 2 (two) times daily with a meal.     metoprolol tartrate (LOPRESSOR) 25 MG tablet Take 0.5 tablets (12.5 mg total) by mouth 2 (two) times daily. 60 tablet 0   montelukast (SINGULAIR) 10 MG tablet Take 10 mg by mouth daily.     Multiple Vitamin (MULTIVITAMIN WITH MINERALS) TABS tablet Take 1 tablet by mouth daily. 30 tablet 0   nystatin ointment (MYCOSTATIN) Apply 1 application topically 2 (two) times daily as needed (rash).     omeprazole (PRILOSEC) 40  MG capsule Take 40 mg by mouth daily.     ondansetron (ZOFRAN) 4 MG tablet Take 1 tablet (4 mg total) by mouth every 6 (six) hours as needed for nausea. 20 tablet 0   OZEMPIC, 1 MG/DOSE, 4 MG/3ML SOPN Inject 1 mg into the skin once a week. tuesdays     PARoxetine (PAXIL) 40 MG tablet Take 1 tablet (40 mg total) by mouth every morning. 90 tablet 0   polyethylene glycol (MIRALAX / GLYCOLAX) 17 g packet Take 17 g by mouth daily as needed for mild constipation. 14 each 0   rOPINIRole (REQUIP) 0.25 MG tablet TAKE 3 TABLETS BY MOUTH AT BEDTIME 90 tablet 5   rosuvastatin (CRESTOR) 10 MG tablet Take 1 tablet by mouth at bedtime.     rosuvastatin (CRESTOR) 5 MG tablet Take 5 mg by mouth daily.     traZODone (DESYREL) 100 MG tablet Take 1 tablet (100 mg total) by mouth at bedtime. 90 tablet 0   TRUEPLUS INSULIN SYRINGE 29G X 1/2" 1 ML MISC 1 each by Other route daily.     VENTOLIN HFA 108 (90 Base) MCG/ACT inhaler Inhale 1-2 puffs into the lungs every 4 (four) hours as needed for wheezing or shortness of breath.     ziprasidone (GEODON) 80 MG capsule Take 1 capsule (80 mg total) by mouth at bedtime. 90 capsule 0   No facility-administered medications prior to visit.     Review of Systems:   Constitutional: No weight loss or gain, night sweats, fevers, chills. +daytime fatigue HEENT: No headaches, difficulty swallowing,  tooth/dental problems, or sore throat. No sneezing, itching, ear ache, nasal congestion, or post nasal drip CV:  No chest pain, orthopnea, PND, swelling in lower extremities, anasarca, dizziness, palpitations, syncope Resp: No shortness of breath with exertion or at rest. No excess mucus or change in color of mucus. No productive or non-productive. No hemoptysis. No wheezing.  No chest wall deformity GI:  No heartburn, indigestion, abdominal pain, nausea, vomiting, diarrhea, change in bowel habits, loss of appetite, bloody stools.  Skin: No rash, lesions, ulcerations MSK:  No joint pain or swelling.  No decreased range of motion.  No back pain. Neuro: No dizziness or lightheadedness.  Psych: No depression or anxiety. Mood stable.     Physical Exam:  BP 120/76 (BP Location: Left Arm, Cuff Size: Normal)   Pulse 67   Temp 97.6 F (36.4 C) (Oral)   Ht '5\' 2"'$  (1.575 m)   Wt 193 lb (87.5 kg)   SpO2 97%   BMI 35.30 kg/m   GEN: Pleasant, interactive, well-appearing; in no acute distress. HEENT:  Normocephalic and atraumatic. PERRLA. Sclera white. Nasal turbinates pink, moist and patent bilaterally. No rhinorrhea present. Oropharynx pink and moist, without exudate or edema. No lesions, ulcerations, or postnasal drip.  NECK:  Supple w/ fair ROM. No JVD present.  CV: RRR, no m/r/g, no peripheral edema. Pulses intact, +2 bilaterally. No cyanosis, pallor or clubbing. PULMONARY:  Unlabored, regular breathing. Clear bilaterally A&P w/o wheezes/rales/rhonchi. No accessory muscle use. No dullness to percussion. GI: BS present and normoactive. Soft, non-tender to palpation. No organomegaly or masses detected. No CVA tenderness. MSK: No erythema, warmth or tenderness. Cap refil <2 sec all extrem. No deformities or joint swelling noted.  Neuro: A/Ox3. No focal deficits noted.   Skin: Warm, no lesions or rashe Psych: Normal affect and behavior. Judgement and thought content appropriate.     Lab  Results:  CBC    Component  Value Date/Time   WBC 9.7 03/23/2021 0428   RBC 3.13 (L) 03/23/2021 0428   HGB 7.9 (L) 03/23/2021 0428   HCT 25.9 (L) 03/23/2021 0428   PLT 486 (H) 03/23/2021 0428   MCV 82.7 03/23/2021 0428   MCH 25.2 (L) 03/23/2021 0428   MCHC 30.5 03/23/2021 0428   RDW 13.2 03/23/2021 0428   LYMPHSABS 2.8 03/23/2021 0428   MONOABS 1.0 03/23/2021 0428   EOSABS 0.0 03/23/2021 0428   BASOSABS 0.0 03/23/2021 0428    BMET    Component Value Date/Time   NA 140 03/23/2021 0428   K 4.1 03/23/2021 0428   CL 104 03/23/2021 0428   CO2 30 03/23/2021 0428   GLUCOSE 85 03/23/2021 0428   BUN 52 (H) 03/23/2021 0428   CREATININE 1.46 (H) 03/23/2021 0428   CALCIUM 8.8 (L) 03/23/2021 0428   GFRNONAA 39 (L) 03/23/2021 0428   GFRAA 38 (L) 06/02/2016 2343    BNP    Component Value Date/Time   BNP 59.3 03/19/2021 1315     Imaging:  No results found.       Latest Ref Rng & Units 05/09/2021   10:07 AM  PFT Results  FVC-Pre L 2.73   FVC-Predicted Pre % 92   FVC-Post L 2.62   FVC-Predicted Post % 89   Pre FEV1/FVC % % 73   Post FEV1/FCV % % 76   FEV1-Pre L 2.00   FEV1-Predicted Pre % 89   FEV1-Post L 2.00   DLCO uncorrected ml/min/mmHg 11.98   DLCO UNC% % 63   DLCO corrected ml/min/mmHg 15.41   DLCO COR %Predicted % 81   DLVA Predicted % 85   TLC L 5.00   TLC % Predicted % 101   RV % Predicted % 110     No results found for: "NITRICOXIDE"      Assessment & Plan:   OSA (obstructive sleep apnea) Previously was not using CPAP due to difficulties with machine. She has restarted therapy since our last OV but we are unable to see a download. We did verify with [facility] that she is using the machine and they can see this on their end; however, her remote download is not pulling any info since before our last visit. She reports that she is still having difficulties with the machine; recommended she take it to [facility] for troubleshooting to ensure the machine is  functioning properly. She has severe OSA and associated hypoxia on previous HST so I am concerned that CPAP is not controlling her. Recommended we move forward with CPAP titration. She is concerned she may not be able to sleep during study; advised she take home medications with her. She was agreeable with this plan.  Patient Instructions  Continue Breo 1 puff daily.  Brush tongue and rinse mouth afterwards Continue Albuterol inhaler 2 puffs or 3 mL neb every 6 hours as needed for shortness of breath or wheezing. Notify if symptoms persist despite rescue inhaler/neb use. Continue singulair 10 mg At bedtime  Continue omeprazole 40 mg daily   Restart wearing CPAP nightly, minimum of 4-6 hours a night. Take your machine to [facility] for troubleshooting. We will move forward with CPAP titration for further evaluation    We discussed how untreated sleep apnea puts an individual at risk for cardiac arrhthymias, pulm HTN, DM, stroke and increases their risk for daytime accidents.    Follow up after CPAP titration with Dr. Lamonte Sakai or Katie Erving Sassano,NP. If symptoms do not improve or worsen,  please contact office for sooner follow up or seek emergency care    COPD (chronic obstructive pulmonary disease) ([place]) Compensated on current regimen. Continue ICS/LABA with Breo and PRN albuterol.  Chronic respiratory failure with hypoxia (HCC) Stable on room air with O2 sats 97% in office. Past sleep study with nocturnal hypoxemia, likely related to OSA. She never underwent CPAP titration to ensure this was corrected with CPAP therapy. We have ordered this today.     I spent 32 minutes of dedicated to the care of this patient on the date of this encounter to include pre-visit review of records, face-to-face time with the patient discussing conditions above, post visit ordering of testing, clinical documentation with the electronic health record, making appropriate referrals as documented, and communicating necessary  findings to members of the patients care team.  Clayton Bibles, NP 10/07/2021  Pt aware and understands NP's role.

## 2021-10-07 NOTE — Patient Instructions (Signed)
Continue Breo 1 puff daily.  Brush tongue and rinse mouth afterwards Continue Albuterol inhaler 2 puffs or 3 mL neb every 6 hours as needed for shortness of breath or wheezing. Notify if symptoms persist despite rescue inhaler/neb use. Continue singulair 10 mg At bedtime  Continue omeprazole 40 mg daily   Restart wearing CPAP nightly, minimum of 4-6 hours a night. Take your machine to [facility] for troubleshooting. We will move forward with CPAP titration for further evaluation    We discussed how untreated sleep apnea puts an individual at risk for cardiac arrhthymias, pulm HTN, DM, stroke and increases their risk for daytime accidents.    Follow up after CPAP titration with Dr. Lamonte Sakai or Katie Eriko Economos,NP. If symptoms do not improve or worsen, please contact office for sooner follow up or seek emergency care

## 2021-11-12 ENCOUNTER — Ambulatory Visit (HOSPITAL_BASED_OUTPATIENT_CLINIC_OR_DEPARTMENT_OTHER): Payer: Medicare Other | Attending: Nurse Practitioner | Admitting: Pulmonary Disease

## 2021-11-12 DIAGNOSIS — G4733 Obstructive sleep apnea (adult) (pediatric): Secondary | ICD-10-CM | POA: Diagnosis present

## 2021-11-27 ENCOUNTER — Telehealth: Payer: Self-pay | Admitting: Pulmonary Disease

## 2021-11-27 NOTE — Telephone Encounter (Signed)
Call patient  Sleep study result  Date of study: 11/12/2021  Impression: Severe obstructive sleep apnea  Recommendation: DME referral  Trial of BiPAP therapy on 25/21 cm H2O with a Small size Resmed Full Face Mirage Quattro mask and heated humidification.  Encourage weight loss measures  Follow-up in the office 4 to 6 weeks following initiation of treatment

## 2021-11-27 NOTE — Procedures (Signed)
POLYSOMNOGRAPHY  Last, First: Naseem, Adler MRN: [id number] Gender: Female Age (years): 71 Weight (lbs): 193 DOB: 1950-05-03 BMI: 35 Primary Care: No PCP Epworth Score: 8 Referring: Clayton Bibles NP Technician: Carolin Coy Interpreting: Laurin Coder MD Study Type: BiPAP Ordered Study Type: CPAP Study date: 11/12/2021 Location: [facility] CLINICAL INFORMATION Chelsea [name] is a 71 year old Female and was referred to the sleep center for evaluation of N/A. Indications include OSA.   Most recent polysomnogram dated 12/31/2020 revealed an AHI of 43.1/h. MEDICATIONS Patient self administered medications include: N/A. Medications administered during study include No sleep medicine administered.  SLEEP STUDY TECHNIQUE The patient underwent an attended overnight polysomnography titration to assess the effects of BIPAP therapy. The following variables were monitored: EEG(C4-A1, C3-A2, O1-A2, O2-A1), EOG, submental and leg EMG, ECG, oxyhemoglobin saturation by pulse oximetry, thoracic and abdominal respiratory effort belts, nasal/oral airflow by pressure sensor, body position sensor and snoring sensor. BIPAP pressure was titrated to eliminate apneas, hypopneas and oxygen desaturation. Hypopneas were scored per AASM definition IB (4% desaturation)  TECHNICIAN COMMENTS Comments added by Technician: Patient had difficulty initiating sleep. Patient was restless all through the night. Patient had more than two awakenings to use the bathroom Comments added by Scorer: N/A SLEEP ARCHITECTURE The study was initiated at 9:55:08 PM and terminated at 4:51:05 AM. Total recorded time was 416 minutes. EEG confirmed total sleep time was 143.5 minutes yielding a sleep efficiency of 34.5%%. Sleep onset after lights out was 29.7 minutes with a REM latency of N/A minutes. The patient spent 71.8%% of the night in stage N1 sleep, 28.2%% in stage N2 sleep, 0.0%% in stage N3 and 0% in REM. The Arousal Index  was 82.0/hour. RESPIRATORY PARAMETERS The overall AHI was 28.0 per hour, and the RDI was 69.8 events/hour with a central apnea index of 0per hour. The most appropriate setting of BiPAP was 25/21 cm H2O. At this setting, the sleep efficiency was 16 % and the patient was supine for 100%. The AHI was 44.3 events per hour, and the RDI was 88.6 events/hour (with 0 central events) and the arousal index was 0 per hour.The oxygen nadir was 94.0% during sleep.    The cumulative time under 88% oxygen saturation was 5.5 minutes  LEG MOVEMENT DATA The total leg movements were 0 with a resulting leg movement index of 0.0. Associated arousal with leg movement index was 0.0. CARDIAC DATA The underlying cardiac rhythm was most consistent with sinus rhythm. Mean heart rate during sleep was 62.6 bpm. Additional rhythm abnormalities include PVCs.  IMPRESSIONS - Moderate Obstructive Sleep apnea(OSA) Optimal pressure attained. - Electrocardiographic data showed presence of PVCs. - No significant Oxygen Desaturation - The patient snored with moderate snoring volume. - No significant periodic leg movements(PLMs) during sleep. However, no significant associated arousals.  DIAGNOSIS - Severe Obstructive Sleep Apnea (G47.33)  RECOMMENDATIONS - Trial of BiPAP therapy on 25/21 cm H2O with a Small size Resmed Full Face Mirage Quattro mask and heated humidification. - Avoid alcohol, sedatives and other CNS depressants that may worsen sleep apnea and disrupt normal sleep architecture. - Sleep hygiene should be reviewed to assess factors that may improve sleep quality. - Weight management and regular exercise should be initiated or continued. - Return to Sleep Center for re-evaluation after 4 weeks of therapy  [Electronically signed] 11/27/2021 09:27 PM  Sherrilyn Rist MD NPI: [id number]

## 2021-11-28 NOTE — Telephone Encounter (Signed)
I called the sister DPR and they want to have an in office visit to go over BiPAP results in the office. They  are aware of the follow up and nothing further is needed.

## 2021-12-04 ENCOUNTER — Ambulatory Visit: Payer: Medicare Other | Admitting: Adult Health

## 2021-12-05 ENCOUNTER — Encounter: Payer: Self-pay | Admitting: Nurse Practitioner

## 2021-12-05 ENCOUNTER — Other Ambulatory Visit: Payer: Self-pay | Admitting: Adult Health

## 2021-12-05 ENCOUNTER — Ambulatory Visit (INDEPENDENT_AMBULATORY_CARE_PROVIDER_SITE_OTHER): Payer: Medicare Other | Admitting: Nurse Practitioner

## 2021-12-05 VITALS — BP 110/70 | HR 74 | Ht 62.0 in | Wt 196.6 lb

## 2021-12-05 DIAGNOSIS — G4733 Obstructive sleep apnea (adult) (pediatric): Secondary | ICD-10-CM

## 2021-12-05 DIAGNOSIS — J449 Chronic obstructive pulmonary disease, unspecified: Secondary | ICD-10-CM

## 2021-12-05 NOTE — Patient Instructions (Signed)
Continue Breo 1 puff daily.  Brush tongue and rinse mouth afterwards Continue Albuterol inhaler 2 puffs or 3 mL neb every 6 hours as needed for shortness of breath or wheezing. Notify if symptoms persist despite rescue inhaler/neb use. Continue singulair 10 mg At bedtime  Continue omeprazole 40 mg daily  Change from CPAP to BiPAP auto IPAP max 25, EPAP min 12, PS 4, mask of choice and heated humidification. Orders sent to your medical supply company today    We discussed how untreated sleep apnea puts an individual at risk for cardiac arrhthymias, pulm HTN, DM, stroke and increases their risk for daytime accidents.    Follow up in 6-8 weeks with Dr. Lamonte Sakai or Joellen Jersey Analycia Khokhar,NP to assess compliance with BiPAP. If symptoms do not improve or worsen, please contact office for sooner follow up or seek emergency care

## 2021-12-05 NOTE — Assessment & Plan Note (Signed)
Severe OSA with AHI 43.1 from PSG performed 12/31/2020.  She has had poor compliance and difficulties tolerating CPAP.  Recent CPAP titration completed; she required BiPAP therapy.  Concerned that she may not tolerate the high pressures of 25/21.  Recommended that we start her on auto BiPAP with IPAP max 25, EPAP min 12 and pressure support of 4.  She was agreeable to this plan.  Urgent referral sent given her severity of sleep apnea and daytime symptoms.  Patient Instructions  Continue Breo 1 puff daily.  Brush tongue and rinse mouth afterwards Continue Albuterol inhaler 2 puffs or 3 mL neb every 6 hours as needed for shortness of breath or wheezing. Notify if symptoms persist despite rescue inhaler/neb use. Continue singulair 10 mg At bedtime  Continue omeprazole 40 mg daily  Change from CPAP to BiPAP auto IPAP max 25, EPAP min 12, PS 4, mask of choice and heated humidification. Orders sent to your medical supply company today    We discussed how untreated sleep apnea puts an individual at risk for cardiac arrhthymias, pulm HTN, DM, stroke and increases their risk for daytime accidents.    Follow up in 6-8 weeks with Dr. Lamonte Sakai or Joellen Jersey Naomie Crow,NP to assess compliance with BiPAP. If symptoms do not improve or worsen, please contact office for sooner follow up or seek emergency care

## 2021-12-05 NOTE — Assessment & Plan Note (Signed)
Compensated on current regimen. Continue maintenance with Breo daily and PRN albuterol

## 2021-12-05 NOTE — Progress Notes (Signed)
$'@Patient'e$  ID: Chelsea [name], female    DOB: 03-20-1951, 71 y.o.   MRN: [id number]  Chief Complaint  Patient presents with   Follow-up    Sleep study, options for CPAP    Referring provider: Darrol Jump, NP  HPI: 71 year old female, former smoker followed for COPD, chronic respiratory failure and OSA on CPAP.  She is a patient of Dr. Agustina Caroli and last seen in office on 10/07/2021 by [facility] NP. Past medical history significant for HTN, LVH, CHF, allergic rhinitis, DM II, hypothyroidism, CKD, obesity, bipolar, insomnia, anxiety, HLD.   TEST/EVENTS:  12/31/2020 PSG: AHI 43.1. Severe OSA 05/09/2021 PFTs: FVC 92, FEV1 89, ratio 76, TLC 101, DLCOcor 81 11/12/2021 CPAP titration: Transition to BiPAP with optimal pressure 25/21 cm water  05/09/2021: OV with Dr. Lamonte Sakai after PFTs. Mild obstruction on PFTs prior to visit. Doing well on Breo - no changes. Reported wearing CPAP nightly; felt better after cahnge to full face mask. Better daytime energy.   09/09/2021: OV with Onnika Siebel NP to discuss issues with her CPAP. Her sister had contacted the office the beginning of this because the patient's neurologist was worried about her oxygen levels dropping at night and curious if she should do an ONO on room air to see if she qualifies for nocturnal oxygen. Upon review, she had not been using her CPAP over the past two months so she was scheduled to come in to discuss further. Today, she reports that she stopped wearing her CPAP because she felt like it would start and stop blowing frequently over night. Aside from this, she had no other complaints. Felt like the full face mask fit her well and was more comfortable. Her sister stated that she is sleeping more during the day now than she was previously. Poorly controlled; has been off CPAP for a few months now. Felt as though the machine was not working properly and not getting enough air at times. Attempting to get a download from DME today so we can review prior use and  see if there is an identifiable problem that we can address. Strongly encouraged her to restart CPAP therapy - pt agreeable to this. Advised that if she continues to have issues with the machine, to take it to [facility] for troubleshooting. If machine is in good working condition, can consider CPAP titration to see if she may need BiPAP therapy. COPD compensated on current regimen with Breo.   10/07/2021: OV with Wylee Dorantes NP for follow up. Last time she was here, she was not using her CPAP consistently due to trouble with the machine. She reports that she has restarted therapy and is using it nightly; however, she is still having problems with the machine. She feels like it stops blowing, which wakes her up, and then she has to press on her mask to get it to start again. Still feeling like she has some daytime fatigue, but this is some improved. She didn't take her machine to [facility] after our last visit. Otherwise, she has no other concerns or complaints today. Breathing is stable on Breo.  Encouraged her to take her machine to DME company for troubleshooting.  Recommended CPAP titration for further evaluation.  Concerned that she may need BiPAP therapy.  12/05/2021: Today-follow-up Patient presents today with sister for follow-up after undergoing CPAP titration.  She was transition to BiPAP with optimal pressure settings obtained.  Reports that since her study, she has not been using her CPAP.  She struggles to wear it at  night because she feels like it stops blowing and she does not get enough air.  Her sister reports that she is still falling asleep throughout the day and occasionally notices snoring.  She does not drive.  Denies any morning headaches, sleep parasomnia/paralysis.  Breathing is stable on Breo.  No respiratory concerns or complaints.  Allergies  Allergen Reactions   Darvocet [Propoxyphene N-Acetaminophen] Anaphylaxis    Can take plain tylenol   Fluoxetine Other (See Comments)    Hallucinations     Penicillins Rash    REACTION: rash   Promethazine Hives    Immunization History  Administered Date(s) Administered   Influenza Split 01/16/2012   Influenza,inj,quad, With Preservative 01/15/2021   Influenza-Unspecified 12/13/2010, 12/30/2014, 10/29/2017   PFIZER(Purple Top)SARS-COV-2 Vaccination 05/26/2019, 06/21/2019   Pneumococcal Polysaccharide-23 10/17/2011   Tdap 08/15/2011    Past Medical History:  Diagnosis Date   Anxiety    Arthritis    Bipolar disorder ([place])    COPD (chronic obstructive pulmonary disease) ([place])    Diabetes mellitus type II    Esophageal stricture    moderate distal   GERD (gastroesophageal reflux disease)    Heart murmur    "HAD AN ECHO YEARS AGO- NOTHING TO BE CONCERNED ABOUT"   Hyperlipemia    Hypertension    Hypothyroidism    Mental disorder    Bipolar   Obesity    Oxygen desaturation during sleep    wears 2 liters at bedtime    Schizophrenia ([place])    patient reports that she is unaware of this   Shortness of breath    with a little activy   Sleep apnea    wears 2 liters of oxygen at bedtime instead of CPAP    Tobacco History: Social History   Tobacco Use  Smoking Status Former   Packs/day: 3.00   Years: 29.00   Total pack years: 87.00   Types: Cigarettes   Quit date: 05/03/1993   Years since quitting: 28.6  Smokeless Tobacco Never   Counseling given: Not Answered   Outpatient Medications Prior to Visit  Medication Sig Dispense Refill   albuterol (PROVENTIL) (2.5 MG/3ML) 0.083% nebulizer solution Take 2.5 mg by nebulization every 6 (six) hours as needed for wheezing or shortness of breath.     fluticasone furoate-vilanterol (BREO ELLIPTA) 200-25 MCG/INH AEPB Inhale 1 puff into the lungs every morning.     montelukast (SINGULAIR) 10 MG tablet Take 10 mg by mouth daily.     VENTOLIN HFA 108 (90 Base) MCG/ACT inhaler Inhale 1-2 puffs into the lungs every 4 (four) hours as needed for wheezing or shortness of breath.      acetaminophen (TYLENOL) 325 MG tablet Take 650 mg by mouth every 6 (six) hours as needed for moderate pain.     bumetanide (BUMEX) 1 MG tablet Take 1 tablet (1 mg total) by mouth daily. 30 tablet 0   chlorpheniramine-HYDROcodone (TUSSIONEX) 10-8 MG/5ML SUER Take 5 mLs by mouth every 12 (twelve) hours as needed for cough. cough (Patient not taking: Reported on 12/05/2021)     Cholecalciferol (VITAMIN D3) 5000 units TABS Take 5,000 Units by mouth daily.     clonazePAM (KLONOPIN) 1 MG tablet Take 1 tablet (1 mg total) by mouth at bedtime. 30 tablet 2   diclofenac sodium (VOLTAREN) 1 % GEL Apply 2 g topically 4 (four) times daily as needed (pain).     Ensure Max Protein (ENSURE MAX PROTEIN) LIQD Take 330 mLs (11 oz total) by mouth 2 (  two) times daily. 3300 mL 0   FEROSUL 325 (65 Fe) MG tablet Take 325 mg by mouth daily.     insulin aspart (NOVOLOG FLEXPEN) 100 UNIT/ML FlexPen Inject 2-12 Units into the skin 3 (three) times daily with meals. Sliding scale     LEVEMIR 100 UNIT/ML injection Inject 30 Units into the skin at bedtime.     levothyroxine (SYNTHROID) 125 MCG tablet Take 125 mcg by mouth daily before breakfast.     metFORMIN (GLUCOPHAGE) 500 MG tablet Take 500 mg by mouth 2 (two) times daily with a meal.     metoprolol tartrate (LOPRESSOR) 25 MG tablet Take 0.5 tablets (12.5 mg total) by mouth 2 (two) times daily. 60 tablet 0   Multiple Vitamin (MULTIVITAMIN WITH MINERALS) TABS tablet Take 1 tablet by mouth daily. 30 tablet 0   nystatin ointment (MYCOSTATIN) Apply 1 application topically 2 (two) times daily as needed (rash).     omeprazole (PRILOSEC) 40 MG capsule Take 40 mg by mouth daily.     ondansetron (ZOFRAN) 4 MG tablet Take 1 tablet (4 mg total) by mouth every 6 (six) hours as needed for nausea. 20 tablet 0   OZEMPIC, 1 MG/DOSE, 4 MG/3ML SOPN Inject 1 mg into the skin once a week. tuesdays     PARoxetine (PAXIL) 40 MG tablet Take 1 tablet (40 mg total) by mouth every morning. 90 tablet 0    polyethylene glycol (MIRALAX / GLYCOLAX) 17 g packet Take 17 g by mouth daily as needed for mild constipation. 14 each 0   rOPINIRole (REQUIP) 0.25 MG tablet TAKE 3 TABLETS BY MOUTH AT BEDTIME 90 tablet 5   rosuvastatin (CRESTOR) 10 MG tablet Take 1 tablet by mouth at bedtime.     rosuvastatin (CRESTOR) 5 MG tablet Take 5 mg by mouth daily.     traZODone (DESYREL) 100 MG tablet Take 1 tablet (100 mg total) by mouth at bedtime. 90 tablet 0   TRUEPLUS INSULIN SYRINGE 29G X 1/2" 1 ML MISC 1 each by Other route daily.     ziprasidone (GEODON) 80 MG capsule Take 1 capsule (80 mg total) by mouth at bedtime. 90 capsule 0   No facility-administered medications prior to visit.     Review of Systems:   Constitutional: No weight loss or gain, night sweats, fevers, chills. +daytime fatigue HEENT: No headaches, difficulty swallowing, tooth/dental problems, or sore throat. No sneezing, itching, ear ache, nasal congestion, or post nasal drip CV:  No chest pain, orthopnea, PND, swelling in lower extremities, anasarca, dizziness, palpitations, syncope Resp: + Snoring.  No shortness of breath with exertion or at rest. No excess mucus or change in color of mucus. No productive or non-productive. No hemoptysis. No wheezing.  No chest wall deformity GI:  No heartburn, indigestion, abdominal pain, nausea, vomiting, diarrhea, change in bowel habits, loss of appetite, bloody stools.  Skin: No rash, lesions, ulcerations MSK:  No joint pain or swelling.  No decreased range of motion.  No back pain. Neuro: No dizziness or lightheadedness.  Psych: No depression or anxiety. Mood stable.     Physical Exam:  BP 110/70 (BP Location: Right Arm)   Pulse 74   Ht '5\' 2"'$  (1.575 m)   Wt 196 lb 9.6 oz (89.2 kg)   SpO2 95%   BMI 35.96 kg/m   GEN: Pleasant, interactive, well-appearing; in no acute distress. HEENT:  Normocephalic and atraumatic. PERRLA. Sclera white. Nasal turbinates pink, moist and patent bilaterally.  No rhinorrhea present. Oropharynx  pink and moist, without exudate or edema. No lesions, ulcerations, or postnasal drip.  NECK:  Supple w/ fair ROM. No JVD present.  CV: RRR, no m/r/g, no peripheral edema. Pulses intact, +2 bilaterally. No cyanosis, pallor or clubbing. PULMONARY:  Unlabored, regular breathing. Clear bilaterally A&P w/o wheezes/rales/rhonchi. No accessory muscle use. No dullness to percussion. GI: BS present and normoactive. Soft, non-tender to palpation. No organomegaly or masses detected. No CVA tenderness. MSK: No erythema, warmth or tenderness. Cap refil <2 sec all extrem. No deformities or joint swelling noted.  Neuro: A/Ox3. No focal deficits noted.   Skin: Warm, no lesions or rashe Psych: Normal affect and behavior. Judgement and thought content appropriate.     Lab Results:  CBC    Component Value Date/Time   WBC 9.7 03/23/2021 0428   RBC 3.13 (L) 03/23/2021 0428   HGB 7.9 (L) 03/23/2021 0428   HCT 25.9 (L) 03/23/2021 0428   PLT 486 (H) 03/23/2021 0428   MCV 82.7 03/23/2021 0428   MCH 25.2 (L) 03/23/2021 0428   MCHC 30.5 03/23/2021 0428   RDW 13.2 03/23/2021 0428   LYMPHSABS 2.8 03/23/2021 0428   MONOABS 1.0 03/23/2021 0428   EOSABS 0.0 03/23/2021 0428   BASOSABS 0.0 03/23/2021 0428    BMET    Component Value Date/Time   NA 140 03/23/2021 0428   K 4.1 03/23/2021 0428   CL 104 03/23/2021 0428   CO2 30 03/23/2021 0428   GLUCOSE 85 03/23/2021 0428   BUN 52 (H) 03/23/2021 0428   CREATININE 1.46 (H) 03/23/2021 0428   CALCIUM 8.8 (L) 03/23/2021 0428   GFRNONAA 39 (L) 03/23/2021 0428   GFRAA 38 (L) 06/02/2016 2343    BNP    Component Value Date/Time   BNP 59.3 03/19/2021 1315     Imaging:  SLEEP STUDY DOCUMENTS  Result Date: 11/13/2021 Ordered by an unspecified provider.        Latest Ref Rng & Units 05/09/2021   10:07 AM  PFT Results  FVC-Pre L 2.73   FVC-Predicted Pre % 92   FVC-Post L 2.62   FVC-Predicted Post % 89   Pre FEV1/FVC  % % 73   Post FEV1/FCV % % 76   FEV1-Pre L 2.00   FEV1-Predicted Pre % 89   FEV1-Post L 2.00   DLCO uncorrected ml/min/mmHg 11.98   DLCO UNC% % 63   DLCO corrected ml/min/mmHg 15.41   DLCO COR %Predicted % 81   DLVA Predicted % 85   TLC L 5.00   TLC % Predicted % 101   RV % Predicted % 110     No results found for: "NITRICOXIDE"      Assessment & Plan:   OSA (obstructive sleep apnea) Severe OSA with AHI 43.1 from PSG performed 12/31/2020.  She has had poor compliance and difficulties tolerating CPAP.  Recent CPAP titration completed; she required BiPAP therapy.  Concerned that she may not tolerate the high pressures of 25/21.  Recommended that we start her on auto BiPAP with IPAP max 25, EPAP min 12 and pressure support of 4.  She was agreeable to this plan.  Urgent referral sent given her severity of sleep apnea and daytime symptoms.  Patient Instructions  Continue Breo 1 puff daily.  Brush tongue and rinse mouth afterwards Continue Albuterol inhaler 2 puffs or 3 mL neb every 6 hours as needed for shortness of breath or wheezing. Notify if symptoms persist despite rescue inhaler/neb use. Continue singulair 10 mg At bedtime  Continue omeprazole 40 mg daily  Change from CPAP to BiPAP auto IPAP max 25, EPAP min 12, PS 4, mask of choice and heated humidification. Orders sent to your medical supply company today    We discussed how untreated sleep apnea puts an individual at risk for cardiac arrhthymias, pulm HTN, DM, stroke and increases their risk for daytime accidents.    Follow up in 6-8 weeks with Dr. Lamonte Sakai or Joellen Jersey Cebastian Neis,NP to assess compliance with BiPAP. If symptoms do not improve or worsen, please contact office for sooner follow up or seek emergency care    COPD (chronic obstructive pulmonary disease) ([place]) Compensated on current regimen. Continue maintenance with Breo daily and PRN albuterol     I spent 32 minutes of dedicated to the care of this patient on the date  of this encounter to include pre-visit review of records, face-to-face time with the patient discussing conditions above, post visit ordering of testing, clinical documentation with the electronic health record, making appropriate referrals as documented, and communicating necessary findings to members of the patients care team.  Clayton Bibles, NP 12/05/2021  Pt aware and understands NP's role.

## 2021-12-06 NOTE — Addendum Note (Signed)
Addended by: Alvin Critchley on: 12/06/2021 11:35 AM   Modules accepted: Orders

## 2021-12-09 ENCOUNTER — Encounter (HOSPITAL_COMMUNITY): Payer: Self-pay | Admitting: Psychiatry

## 2021-12-09 ENCOUNTER — Telehealth (HOSPITAL_BASED_OUTPATIENT_CLINIC_OR_DEPARTMENT_OTHER): Payer: Medicare Other | Admitting: Psychiatry

## 2021-12-09 DIAGNOSIS — F200 Paranoid schizophrenia: Secondary | ICD-10-CM

## 2021-12-09 DIAGNOSIS — F411 Generalized anxiety disorder: Secondary | ICD-10-CM | POA: Diagnosis not present

## 2021-12-09 MED ORDER — ZIPRASIDONE HCL 80 MG PO CAPS: 80.0000 mg | ORAL_CAPSULE | Freq: Every day | ORAL | 0 refills | Status: DC

## 2021-12-09 MED ORDER — PAROXETINE HCL 40 MG PO TABS: 40.0000 mg | ORAL_TABLET | Freq: Every morning | ORAL | 0 refills | Status: DC

## 2021-12-09 MED ORDER — CLONAZEPAM 1 MG PO TABS: 1.0000 mg | ORAL_TABLET | Freq: Every day | ORAL | 2 refills | Status: DC

## 2021-12-09 MED ORDER — TRAZODONE HCL 100 MG PO TABS: 100.0000 mg | ORAL_TABLET | Freq: Every day | ORAL | 0 refills | Status: DC

## 2021-12-09 NOTE — Progress Notes (Signed)
Virtual Visit via Telephone Note  I connected with Chelsea [name] on 12/09/21 at  3:20 PM EDT by telephone and verified that I am speaking with the correct person using two identifiers.  Location: Patient: Home Provider: Office   I discussed the limitations, risks, security and privacy concerns of performing an evaluation and management service by telephone and the availability of in person appointments. I also discussed with the patient that there may be a patient responsible charge related to this service. The patient expressed understanding and agreed to proceed.   History of Present Illness: Patient is evaluated by phone session.  She is taking all her medication as prescribed.  She is using CPAP which is helping her sleep.  She does go outside to the grocery stores with the sister and does not feel as paranoid or anxious.  Sometimes her blood sugar fluctuates but now she is trying to keep the number stable.  She is using insulin.  She denies any tremors, shakes or any EPS.  She denies any suicidal thoughts.  Her appetite is okay.  Her energy level is fair.  Sometimes she is forgetful but manageable.  She does not drive.  She like to keep the current medication and same pharmacy.     Past Psychiatric History: H/O inpatient at [facility] and [facility] for mania and psychosis.  No h/o suicidal attempt.   Psychiatric Specialty Exam: Physical Exam  Review of Systems  Weight 196 lb (88.9 kg).There is no height or weight on file to calculate BMI.  General Appearance: NA  Eye Contact:  NA  Speech:  Slow  Volume:  Decreased  Mood:  Euthymic  Affect:  NA  Thought Process:  Goal Directed  Orientation:  Full (Time, Place, and Person)  Thought Content:  WDL  Suicidal Thoughts:  No  Homicidal Thoughts:  No  Memory:  Immediate;   Good Recent;   Fair Remote;   Fair  Judgement:  Intact  Insight:  Present  Psychomotor Activity:  NA  Concentration:  Concentration: Fair and Attention  Span: Fair  Recall:  [employer] of Knowledge:  Fair  Language:  Good  Akathisia:  No  Handed:  Right  AIMS (if indicated):     Assets:  Communication Skills Desire for Improvement Housing  ADL's:  Intact  Cognition:  WNL  Sleep:   ok with CPAP      Assessment and Plan: Schizophrenia chronic paranoid type.  Generalized anxiety disorder.  Patient is stable on her current medication.  She is seeing virtual primary care physician Esmeralda Links for her primary care needs.  Like to keep the current medication.  We discussed polypharmacy.  I offered if she is stable in her symptoms and we may try to cut down the medication but patient resistant to cut down her current doses.  We will continue trazodone 100 mg at bedtime, Paxil 40 mg daily, Geodon 80 mg at bedtime and Klonopin 1 mg at bedtime.  Recommended to call us back if she has any question or any concern.  Follow-up in 3 months.     Follow Up Instructions:    I discussed the assessment and treatment plan with the patient. The patient was provided an opportunity to ask questions and all were answered. The patient agreed with the plan and demonstrated an understanding of the instructions.  Collaboration of Care: Other provider involved in patient's care AEB notes are available in epic to review.  Patient/Guardian was advised Release  of Information must be obtained prior to any record release in order to collaborate their care with an outside provider. Patient/Guardian was advised if they have not already done so to contact the registration department to sign all necessary forms in order for [place] to release information regarding their care.   Consent: Patient/Guardian gives verbal consent for treatment and assignment of benefits for services provided during this visit. Patient/Guardian expressed understanding and agreed to proceed.     The patient was advised to call back or seek an in-person evaluation if the symptoms worsen or if the  condition fails to improve as anticipated.  I provided 20 minutes of non-face-to-face time during this encounter.   Kathlee Nations, MD

## 2021-12-11 ENCOUNTER — Telehealth: Payer: Self-pay | Admitting: Emergency Medicine

## 2021-12-11 ENCOUNTER — Telehealth (HOSPITAL_COMMUNITY): Payer: Self-pay | Admitting: Psychiatry

## 2021-12-11 NOTE — Telephone Encounter (Signed)
Patient's relative is calling regarding switching a cpap for the bipap.  Relative has called [facility] and they said they do not see the order.  Please advise and call patient to discuss.

## 2021-12-11 NOTE — Telephone Encounter (Signed)
Left message requesting call back to schedule 3 month follow up with Dr. Adele Schilder.

## 2021-12-13 NOTE — Telephone Encounter (Signed)
Last office visit with Chelsea [name] states she is changing from cpap to bipap. Refaxing orders for bipap to [facility] for clarification. Nothing further needed

## 2021-12-16 ENCOUNTER — Other Ambulatory Visit: Payer: Self-pay | Admitting: Nurse Practitioner

## 2021-12-16 DIAGNOSIS — Z1231 Encounter for screening mammogram for malignant neoplasm of breast: Secondary | ICD-10-CM

## 2022-01-07 ENCOUNTER — Ambulatory Visit
Admission: RE | Admit: 2022-01-07 | Discharge: 2022-01-07 | Disposition: A | Discharge status: Home / Self Care | Payer: Medicare Other | Source: Ambulatory Visit | Attending: Nurse Practitioner | Admitting: Nurse Practitioner

## 2022-01-07 DIAGNOSIS — Z1231 Encounter for screening mammogram for malignant neoplasm of breast: Secondary | ICD-10-CM

## 2022-01-22 ENCOUNTER — Encounter: Payer: Self-pay | Admitting: Emergency Medicine

## 2022-01-22 ENCOUNTER — Ambulatory Visit (INDEPENDENT_AMBULATORY_CARE_PROVIDER_SITE_OTHER): Payer: Medicare Other | Admitting: Emergency Medicine

## 2022-01-22 DIAGNOSIS — J449 Chronic obstructive pulmonary disease, unspecified: Secondary | ICD-10-CM

## 2022-01-22 DIAGNOSIS — G4733 Obstructive sleep apnea (adult) (pediatric): Secondary | ICD-10-CM

## 2022-01-22 NOTE — Progress Notes (Signed)
Subjective:    Patient ID: Chelsea [name], female    DOB: Oct 28, 1950, 71 y.o.   MRN: [id number]  HPI  ROV 05/09/21 --71 year old woman who follows up for history of COPD with associated hypoxemic respiratory failure, newly diagnosed OSA trying to get used to CPAP. PMH: Diabetes, GERD, bipolar, hypertension with chronic diastolic CHF She is wearing her CPAP reliably - changed to a full face mask. She believes that she is benefiting - better daytime energy. She believes that her breathing is doing well, better endurance.   Pulmonary function testing performed today and reviewed by me, shows evidence for mild obstruction without a bronchodilator response, normal lung volumes, decreased diffusion capacity that corrects to the normal range when adjusted for alveolar volume.  There is some curve to the flow-volume loop.  ROV 01/22/22 --follow-up visit 71 year old woman with a history of COPD, OSA and associated hypoxemic respiratory failure.  She also has diabetes, GERD, bipolar disorder, hypertension with chronic diastolic CHF.  Since I last saw her she has undergone a CPAP titration which recommended transition to BiPAP.  She has a BiPAP device and has been wearing it reliably.  She was started on auto titration IPAP (max 25), EPAP (min 12), PS 4. She has significantly improved compliance. No longer napping during the day. More active during the day. Improved functional capacity and breathing.  Download information available 10/5-10/24/2023 confirms that she is used the device 100% of the nights.  She has used it for over 4 hours on 50% of the nights.  Good control of her events.  Minimal leak. She remains on Breo. Tolerating and benefiting. No SOB. No flares, pred, abx.  Due for flu shot.    Review of Systems As per HPI  Past Medical History:  Diagnosis Date   Anxiety    Arthritis    Bipolar disorder (HCC)    COPD (chronic obstructive pulmonary disease) (HCC)    Diabetes mellitus type II     Esophageal stricture    moderate distal   GERD (gastroesophageal reflux disease)    Heart murmur    "HAD AN ECHO YEARS AGO- NOTHING TO BE CONCERNED ABOUT"   Hyperlipemia    Hypertension    Hypothyroidism    Mental disorder    Bipolar   Obesity    Oxygen desaturation during sleep    wears 2 liters at bedtime    Schizophrenia ([place])    patient reports that she is unaware of this   Shortness of breath    with a little activy   Sleep apnea    wears 2 liters of oxygen at bedtime instead of CPAP     Family History  Problem Relation Age of Onset   Alzheimer's disease Mother    Alcohol abuse Father    Stroke Father    Sleep apnea Sister    Restless legs syndrome Neg Hx      Social History   Socioeconomic History   Marital status: Divorced    Spouse name: Not on file   Number of children: 0   Years of education: HS   Highest education level: Not on file  Occupational History   Occupation: Retired   Tobacco Use   Smoking status: Former    Packs/day: 3.00    Years: 29.00    Total pack years: 87.00    Types: Cigarettes    Quit date: 05/03/1993    Years since quitting: 28.7   Smokeless tobacco: Never  Vaping Use  Vaping Use: Never used  Substance and Sexual Activity   Alcohol use: No    Alcohol/week: 0.0 [name] drinks of alcohol   Drug use: No   Sexual activity: Not Currently  Other Topics Concern   Not on file  Social History Narrative   Drinks 2 cups of coffee a day   Social Determinants of Health   Financial Resource Strain: Not on file  Food Insecurity: Not on file  Transportation Needs: Not on file  Physical Activity: Not on file  Stress: Not on file  Social Connections: Not on file  Intimate Partner Violence: Not on file     Allergies  Allergen Reactions   Darvocet [Propoxyphene N-Acetaminophen] Anaphylaxis    Can take plain tylenol   Fluoxetine Other (See Comments)    Hallucinations    Penicillins Rash    REACTION: rash   Promethazine Hives      Outpatient Medications Prior to Visit  Medication Sig Dispense Refill   acetaminophen (TYLENOL) 325 MG tablet Take 650 mg by mouth every 6 (six) hours as needed for moderate pain.     albuterol (PROVENTIL) (2.5 MG/3ML) 0.083% nebulizer solution Take 2.5 mg by nebulization every 6 (six) hours as needed for wheezing or shortness of breath.     bumetanide (BUMEX) 1 MG tablet Take 1 tablet (1 mg total) by mouth daily. 30 tablet 0   chlorpheniramine-HYDROcodone (TUSSIONEX) 10-8 MG/5ML SUER Take 5 mLs by mouth every 12 (twelve) hours as needed for cough. cough (Patient not taking: Reported on 12/05/2021)     Cholecalciferol (VITAMIN D3) 5000 units TABS Take 5,000 Units by mouth daily.     clonazePAM (KLONOPIN) 1 MG tablet Take 1 tablet (1 mg total) by mouth at bedtime. 30 tablet 2   diclofenac sodium (VOLTAREN) 1 % GEL Apply 2 g topically 4 (four) times daily as needed (pain).     Ensure Max Protein (ENSURE MAX PROTEIN) LIQD Take 330 mLs (11 oz total) by mouth 2 (two) times daily. 3300 mL 0   FEROSUL 325 (65 Fe) MG tablet Take 325 mg by mouth daily.     fluticasone furoate-vilanterol (BREO ELLIPTA) 200-25 MCG/INH AEPB Inhale 1 puff into the lungs every morning.     insulin aspart (NOVOLOG FLEXPEN) 100 UNIT/ML FlexPen Inject 2-12 Units into the skin 3 (three) times daily with meals. Sliding scale     LEVEMIR 100 UNIT/ML injection Inject 30 Units into the skin at bedtime.     levothyroxine (SYNTHROID) 125 MCG tablet Take 125 mcg by mouth daily before breakfast.     metFORMIN (GLUCOPHAGE) 500 MG tablet Take 500 mg by mouth 2 (two) times daily with a meal.     metoprolol tartrate (LOPRESSOR) 25 MG tablet Take 0.5 tablets (12.5 mg total) by mouth 2 (two) times daily. 60 tablet 0   montelukast (SINGULAIR) 10 MG tablet Take 10 mg by mouth daily.     Multiple Vitamin (MULTIVITAMIN WITH MINERALS) TABS tablet Take 1 tablet by mouth daily. 30 tablet 0   nystatin ointment (MYCOSTATIN) Apply 1 application  topically 2 (two) times daily as needed (rash).     omeprazole (PRILOSEC) 40 MG capsule Take 40 mg by mouth daily.     ondansetron (ZOFRAN) 4 MG tablet Take 1 tablet (4 mg total) by mouth every 6 (six) hours as needed for nausea. 20 tablet 0   OZEMPIC, 1 MG/DOSE, 4 MG/3ML SOPN Inject 1 mg into the skin once a week. tuesdays     PARoxetine (PAXIL)  40 MG tablet Take 1 tablet (40 mg total) by mouth every morning. 90 tablet 0   polyethylene glycol (MIRALAX / GLYCOLAX) 17 g packet Take 17 g by mouth daily as needed for mild constipation. 14 each 0   rOPINIRole (REQUIP) 0.25 MG tablet TAKE 3 TABLETS BY MOUTH AT BEDTIME 90 tablet 5   rosuvastatin (CRESTOR) 10 MG tablet Take 1 tablet by mouth at bedtime.     rosuvastatin (CRESTOR) 5 MG tablet Take 5 mg by mouth daily.     traZODone (DESYREL) 100 MG tablet Take 1 tablet (100 mg total) by mouth at bedtime. 90 tablet 0   TRUEPLUS INSULIN SYRINGE 29G X 1/2" 1 ML MISC 1 each by Other route daily.     VENTOLIN HFA 108 (90 Base) MCG/ACT inhaler Inhale 1-2 puffs into the lungs every 4 (four) hours as needed for wheezing or shortness of breath.     ziprasidone (GEODON) 80 MG capsule Take 1 capsule (80 mg total) by mouth at bedtime. 90 capsule 0   No facility-administered medications prior to visit.         Objective:   Physical Exam Vitals:   01/22/22 1132  BP: (!) 140/76  Pulse: 67  Temp: 98 F (36.7 C)  TempSrc: Oral  SpO2: 98%  Weight: 196 lb 6.4 oz (89.1 kg)  Height: '5\' 2"'$  (1.575 m)    Gen: Pleasant, overweight woman, in no distress, somewhat quiet affect  ENT: No lesions,  mouth clear,  oropharynx clear, no postnasal drip, edentulous  Neck: No JVD, no stridor  Lungs: No use of accessory muscles, distant but no crackles or wheezing on normal respiration, no wheeze on forced expiration  Cardiovascular: RRR, heart sounds normal, no murmur or gallops, no peripheral edema  Musculoskeletal: No deformities, no cyanosis or clubbing  Neuro:  alert, awake, non focal  Skin: Warm, no lesions or rash       Assessment & Plan:  OSA (obstructive sleep apnea) Benefiting significantly from the change from CPAP to BiPAP.  She is on auto titration IPAP max 25, EPAP min 12, PS 4.  Compliance data reviewed.  Good compliance and good clinical benefit with better functional capacity, less sleepiness, more energy, improved breathing.  Plan to continue same  COPD (chronic obstructive pulmonary disease) ([place]) Benefiting from [place].  Minimal respiratory symptoms currently.  No flares.  No albuterol use.  She does have it available.  Plan to continue her current regimen.  She is due to get the flu shot.  We talked briefly about getting the COVID-19 vaccine as well.  She is going to think about this.  Baltazar Apo, MD, PhD 01/22/2022, 11:53 AM [facility] [phone] or if no answer before 7:00PM call [phone] For any issues after 7:00PM please call eLink [phone]

## 2022-01-22 NOTE — Assessment & Plan Note (Signed)
Benefiting from [place].  Minimal respiratory symptoms currently.  No flares.  No albuterol use.  She does have it available.  Plan to continue her current regimen.  She is due to get the flu shot.  We talked briefly about getting the COVID-19 vaccine as well.  She is going to think about this.

## 2022-01-22 NOTE — Assessment & Plan Note (Signed)
Benefiting significantly from the change from CPAP to BiPAP.  She is on auto titration IPAP max 25, EPAP min 12, PS 4.  Compliance data reviewed.  Good compliance and good clinical benefit with better functional capacity, less sleepiness, more energy, improved breathing.  Plan to continue same

## 2022-01-22 NOTE — Patient Instructions (Signed)
Keep wearing your BiPAP reliably every night.  We reviewed your compliance data today. Continue your Breo once daily.  Rinse and gargle after using. Keep your albuterol available to use 2 puffs or 1 nebulizer treatment if you needed for shortness of breath, chest tightness, wheezing. Get the flu shot as planned. You would probably benefit from getting the COVID-19 vaccine this fall Follow with Dr. Lamonte Sakai in 12 months or sooner if you have any problems.

## 2022-02-18 ENCOUNTER — Emergency Department (HOSPITAL_COMMUNITY): Payer: Medicare Other

## 2022-02-18 ENCOUNTER — Inpatient Hospital Stay (HOSPITAL_COMMUNITY)
Admission: EM | Admit: 2022-02-18 | Discharge: 2022-02-20 | DRG: 177 | Disposition: A | Discharge status: Home / Self Care | Payer: Medicare Other | Attending: Internal Medicine | Admitting: Internal Medicine

## 2022-02-18 ENCOUNTER — Other Ambulatory Visit: Payer: Self-pay

## 2022-02-18 ENCOUNTER — Encounter (HOSPITAL_COMMUNITY): Payer: Self-pay

## 2022-02-18 DIAGNOSIS — Z885 Allergy status to narcotic agent status: Secondary | ICD-10-CM

## 2022-02-18 DIAGNOSIS — Z87891 Personal history of nicotine dependence: Secondary | ICD-10-CM

## 2022-02-18 DIAGNOSIS — Z794 Long term (current) use of insulin: Secondary | ICD-10-CM

## 2022-02-18 DIAGNOSIS — F411 Generalized anxiety disorder: Secondary | ICD-10-CM | POA: Diagnosis present

## 2022-02-18 DIAGNOSIS — J449 Chronic obstructive pulmonary disease, unspecified: Secondary | ICD-10-CM | POA: Diagnosis present

## 2022-02-18 DIAGNOSIS — E1122 Type 2 diabetes mellitus with diabetic chronic kidney disease: Secondary | ICD-10-CM | POA: Diagnosis present

## 2022-02-18 DIAGNOSIS — E785 Hyperlipidemia, unspecified: Secondary | ICD-10-CM | POA: Diagnosis present

## 2022-02-18 DIAGNOSIS — Z7985 Long-term (current) use of injectable non-insulin antidiabetic drugs: Secondary | ICD-10-CM

## 2022-02-18 DIAGNOSIS — D631 Anemia in chronic kidney disease: Secondary | ICD-10-CM | POA: Diagnosis present

## 2022-02-18 DIAGNOSIS — I10 Essential (primary) hypertension: Secondary | ICD-10-CM | POA: Diagnosis present

## 2022-02-18 DIAGNOSIS — F200 Paranoid schizophrenia: Secondary | ICD-10-CM | POA: Diagnosis present

## 2022-02-18 DIAGNOSIS — Z66 Do not resuscitate: Secondary | ICD-10-CM | POA: Insufficient documentation

## 2022-02-18 DIAGNOSIS — U071 COVID-19: Principal | ICD-10-CM | POA: Diagnosis present

## 2022-02-18 DIAGNOSIS — G4733 Obstructive sleep apnea (adult) (pediatric): Secondary | ICD-10-CM | POA: Diagnosis present

## 2022-02-18 DIAGNOSIS — J441 Chronic obstructive pulmonary disease with (acute) exacerbation: Secondary | ICD-10-CM | POA: Diagnosis present

## 2022-02-18 DIAGNOSIS — K219 Gastro-esophageal reflux disease without esophagitis: Secondary | ICD-10-CM | POA: Diagnosis present

## 2022-02-18 DIAGNOSIS — Z79899 Other long term (current) drug therapy: Secondary | ICD-10-CM

## 2022-02-18 DIAGNOSIS — T380X5A Adverse effect of glucocorticoids and synthetic analogues, initial encounter: Secondary | ICD-10-CM | POA: Diagnosis present

## 2022-02-18 DIAGNOSIS — Z7951 Long term (current) use of inhaled steroids: Secondary | ICD-10-CM

## 2022-02-18 DIAGNOSIS — I5032 Chronic diastolic (congestive) heart failure: Secondary | ICD-10-CM | POA: Diagnosis present

## 2022-02-18 DIAGNOSIS — J9601 Acute respiratory failure with hypoxia: Secondary | ICD-10-CM | POA: Diagnosis present

## 2022-02-18 DIAGNOSIS — Z79890 Hormone replacement therapy (postmenopausal): Secondary | ICD-10-CM

## 2022-02-18 DIAGNOSIS — F319 Bipolar disorder, unspecified: Secondary | ICD-10-CM | POA: Diagnosis present

## 2022-02-18 DIAGNOSIS — Z888 Allergy status to other drugs, medicaments and biological substances status: Secondary | ICD-10-CM

## 2022-02-18 DIAGNOSIS — Z6835 Body mass index (BMI) 35.0-35.9, adult: Secondary | ICD-10-CM

## 2022-02-18 DIAGNOSIS — E039 Hypothyroidism, unspecified: Secondary | ICD-10-CM | POA: Diagnosis present

## 2022-02-18 DIAGNOSIS — N1831 Chronic kidney disease, stage 3a: Secondary | ICD-10-CM | POA: Diagnosis present

## 2022-02-18 DIAGNOSIS — E1165 Type 2 diabetes mellitus with hyperglycemia: Secondary | ICD-10-CM | POA: Diagnosis present

## 2022-02-18 DIAGNOSIS — Z7984 Long term (current) use of oral hypoglycemic drugs: Secondary | ICD-10-CM

## 2022-02-18 DIAGNOSIS — Z880 Allergy status to penicillin: Secondary | ICD-10-CM

## 2022-02-18 DIAGNOSIS — D72819 Decreased white blood cell count, unspecified: Secondary | ICD-10-CM | POA: Diagnosis present

## 2022-02-18 DIAGNOSIS — E119 Type 2 diabetes mellitus without complications: Secondary | ICD-10-CM

## 2022-02-18 DIAGNOSIS — I130 Hypertensive heart and chronic kidney disease with heart failure and stage 1 through stage 4 chronic kidney disease, or unspecified chronic kidney disease: Secondary | ICD-10-CM | POA: Diagnosis present

## 2022-02-18 LAB — COMPREHENSIVE METABOLIC PANEL
ALT: 12 U/L (ref 0–44)
AST: 21 U/L (ref 15–41)
Albumin: 2.8 g/dL — ABNORMAL LOW (ref 3.5–5.0)
Alkaline Phosphatase: 78 U/L (ref 38–126)
Anion gap: 14 (ref 5–15)
BUN: 20 mg/dL (ref 8–23)
CO2: 21 mmol/L — ABNORMAL LOW (ref 22–32)
Calcium: 8.9 mg/dL (ref 8.9–10.3)
Chloride: 104 mmol/L (ref 98–111)
Creatinine, Ser: 1.55 mg/dL — ABNORMAL HIGH (ref 0.44–1.00)
GFR, Estimated: 36 mL/min — ABNORMAL LOW (ref >60)
Glucose, Bld: 230 mg/dL — ABNORMAL HIGH (ref 70–99)
Potassium: 4.4 mmol/L (ref 3.5–5.1)
Sodium: 139 mmol/L (ref 135–145)
Total Bilirubin: 0.5 mg/dL (ref 0.3–1.2)
Total Protein: 6.2 g/dL — ABNORMAL LOW (ref 6.5–8.1)

## 2022-02-18 LAB — CBC WITH DIFFERENTIAL/PLATELET
Abs Immature Granulocytes: 0.02 10*3/uL (ref 0.00–0.07)
Basophils Absolute: 0 10*3/uL (ref 0.0–0.1)
Basophils Relative: 0 %
Eosinophils Absolute: 0 10*3/uL (ref 0.0–0.5)
Eosinophils Relative: 1 %
HCT: 27.5 % — ABNORMAL LOW (ref 36.0–46.0)
Hemoglobin: 8.2 g/dL — ABNORMAL LOW (ref 12.0–15.0)
Immature Granulocytes: 0 %
Lymphocytes Relative: 32 %
Lymphs Abs: 1.5 10*3/uL (ref 0.7–4.0)
MCH: 24.3 pg — ABNORMAL LOW (ref 26.0–34.0)
MCHC: 29.8 g/dL — ABNORMAL LOW (ref 30.0–36.0)
MCV: 81.6 fL (ref 80.0–100.0)
Monocytes Absolute: 0.5 10*3/uL (ref 0.1–1.0)
Monocytes Relative: 12 %
Neutro Abs: 2.5 10*3/uL (ref 1.7–7.7)
Neutrophils Relative %: 55 %
Platelets: 265 10*3/uL (ref 150–400)
RBC: 3.37 MIL/uL — ABNORMAL LOW (ref 3.87–5.11)
RDW: 13.5 % (ref 11.5–15.5)
WBC: 4.6 10*3/uL (ref 4.0–10.5)
nRBC: 0 % (ref 0.0–0.2)

## 2022-02-18 LAB — HEMOGLOBIN A1C
Hgb A1c MFr Bld: 7.5 % — ABNORMAL HIGH (ref 4.8–5.6)
Mean Plasma Glucose: 168.55 mg/dL

## 2022-02-18 LAB — CBG MONITORING, ED: Glucose-Capillary: 177 mg/dL — ABNORMAL HIGH (ref 70–99)

## 2022-02-18 LAB — BRAIN NATRIURETIC PEPTIDE: B Natriuretic Peptide: 76.1 pg/mL (ref 0.0–100.0)

## 2022-02-18 LAB — RESP PANEL BY RT-PCR (FLU A&B, COVID) ARPGX2
Influenza A by PCR: NEGATIVE
Influenza B by PCR: NEGATIVE
SARS Coronavirus 2 by RT PCR: POSITIVE — AB

## 2022-02-18 LAB — TROPONIN I (HIGH SENSITIVITY)
Troponin I (High Sensitivity): 24 ng/L — ABNORMAL HIGH (ref <18)
Troponin I (High Sensitivity): 19 ng/L — ABNORMAL HIGH (ref <18)

## 2022-02-18 MED ORDER — ACETAMINOPHEN 650 MG RE SUPP: 650.0000 mg | Freq: Four times a day (QID) | RECTAL | Status: DC | PRN

## 2022-02-18 MED ORDER — ACETAMINOPHEN 325 MG PO TABS: 650.0000 mg | ORAL_TABLET | Freq: Four times a day (QID) | ORAL | Status: DC | PRN

## 2022-02-18 MED ORDER — MELATONIN 5 MG PO TABS
10.0000 mg | ORAL_TABLET | Freq: Every evening | ORAL | Status: DC | PRN
  Filled 2022-02-18: qty 2

## 2022-02-18 MED ORDER — BUMETANIDE 1 MG PO TABS
1.0000 mg | ORAL_TABLET | Freq: Every day | ORAL | Status: DC
  Administered 2022-02-19 – 2022-02-20 (×2): 1 mg via ORAL
  Filled 2022-02-18 (×3): qty 1

## 2022-02-18 MED ORDER — ONDANSETRON HCL 4 MG/2ML IJ SOLN: 4.0000 mg | Freq: Four times a day (QID) | INTRAMUSCULAR | Status: DC | PRN

## 2022-02-18 MED ORDER — PANTOPRAZOLE SODIUM 40 MG PO TBEC
40.0000 mg | DELAYED_RELEASE_TABLET | Freq: Every day | ORAL | Status: DC
  Administered 2022-02-18 – 2022-02-20 (×3): 40 mg via ORAL
  Filled 2022-02-18 (×3): qty 1

## 2022-02-18 MED ORDER — INSULIN ASPART 100 UNIT/ML IJ SOLN
0.0000 [IU] | Freq: Every day | INTRAMUSCULAR | Status: DC
  Administered 2022-02-19: 3 [IU] via SUBCUTANEOUS

## 2022-02-18 MED ORDER — IPRATROPIUM-ALBUTEROL 0.5-2.5 (3) MG/3ML IN SOLN
3.0000 mL | Freq: Four times a day (QID) | RESPIRATORY_TRACT | Status: DC
  Administered 2022-02-18 – 2022-02-20 (×5): 3 mL via RESPIRATORY_TRACT
  Filled 2022-02-18 (×5): qty 3

## 2022-02-18 MED ORDER — LEVOTHYROXINE SODIUM 25 MCG PO TABS
125.0000 ug | ORAL_TABLET | Freq: Every day | ORAL | Status: DC
  Administered 2022-02-19 – 2022-02-20 (×2): 125 ug via ORAL
  Filled 2022-02-18 (×2): qty 1

## 2022-02-18 MED ORDER — HEPARIN SODIUM (PORCINE) 5000 UNIT/ML IJ SOLN
5000.0000 [IU] | Freq: Three times a day (TID) | INTRAMUSCULAR | Status: DC
  Administered 2022-02-18 – 2022-02-20 (×4): 5000 [IU] via SUBCUTANEOUS
  Filled 2022-02-18 (×4): qty 1

## 2022-02-18 MED ORDER — DEXAMETHASONE SODIUM PHOSPHATE 10 MG/ML IJ SOLN
10.0000 mg | Freq: Once | INTRAMUSCULAR | Status: AC
  Administered 2022-02-18: 10 mg via INTRAVENOUS
  Filled 2022-02-18: qty 1

## 2022-02-18 MED ORDER — INSULIN GLARGINE-YFGN 100 UNIT/ML ~~LOC~~ SOLN
10.0000 [IU] | Freq: Every day | SUBCUTANEOUS | Status: DC
  Filled 2022-02-18: qty 0.1

## 2022-02-18 MED ORDER — ONDANSETRON HCL 4 MG PO TABS: 4.0000 mg | ORAL_TABLET | Freq: Four times a day (QID) | ORAL | Status: DC | PRN

## 2022-02-18 MED ORDER — MOLNUPIRAVIR EUA 200MG CAPSULE
4.0000 | ORAL_CAPSULE | Freq: Two times a day (BID) | ORAL | Status: DC
  Administered 2022-02-18 – 2022-02-20 (×3): 800 mg via ORAL
  Filled 2022-02-18 (×2): qty 4

## 2022-02-18 MED ORDER — DEXAMETHASONE 4 MG PO TABS: 6.0000 mg | ORAL_TABLET | Freq: Every day | ORAL | Status: DC

## 2022-02-18 MED ORDER — AMLODIPINE BESYLATE 5 MG PO TABS
5.0000 mg | ORAL_TABLET | Freq: Every day | ORAL | Status: DC
  Administered 2022-02-19 – 2022-02-20 (×2): 5 mg via ORAL
  Filled 2022-02-18 (×2): qty 1

## 2022-02-18 MED ORDER — INSULIN ASPART 100 UNIT/ML IJ SOLN
0.0000 [IU] | Freq: Three times a day (TID) | INTRAMUSCULAR | Status: DC
  Administered 2022-02-19: 4 [IU] via SUBCUTANEOUS
  Administered 2022-02-19: 1 [IU] via SUBCUTANEOUS
  Administered 2022-02-19: 11 [IU] via SUBCUTANEOUS
  Administered 2022-02-20: 9 [IU] via SUBCUTANEOUS

## 2022-02-18 NOTE — Assessment & Plan Note (Signed)
Stable.  Continue with long-acting and short acting insulins.  Hold metformin.  Hold Ozempic.

## 2022-02-18 NOTE — Assessment & Plan Note (Addendum)
Stable.  Hold Lopressor due to junctional rhythm.  Start Norvasc 5 mg for hypertension control.

## 2022-02-18 NOTE — H&P (Signed)
History and Physical    Chelsea [name] HKV:[id number] DOB: 02/02/1951 DOA: 02/18/2022  DOS: the patient was seen and examined on 02/18/2022  PCP: Darrol Jump, NP   Patient coming from: Home  I have personally briefly reviewed patient's old medical records in [place]  Chief complaint:Shortness of breath History present illness: 71 year old female history of type 2 diabetes, hypertension, morbid obesity, OSA on BiPAP at night, history of bipolar 1 disorder presents to the ER today with a 2-day history of shortness of breath.  Patient states that her sister and her brother-in-law whom she lives with are ill.  They have had upper respiratory symptoms for the last week.  Patient's had some chills but no documented fever.  EMS was sent out to the house due to dyspnea.  Reportedly patient had respiratory decompensation in route and was started on BiPAP.  Patient herself denies using oxygen at home although EMS reported the patient does have oxygen supply at home.  In the ER, patient was continued on BiPAP.  COVID test was positive.  Chest x-ray is negative.  Patient was anemic with hemoglobin 8.2 but this is chronic.  Serum creatinine of 1.55.  Baseline creatinine 1.35-1.46.  BNP was normal at 76.  Chest x-ray was negative for infiltrates.  [facility] hospital[facility] contacted for admission due to positive COVID test and continued tachypnea.   ED Course: Continued on BiPAP.  Chest x-ray negative.  COVID test positive.  Review of Systems:  Review of Systems  Constitutional:  Positive for chills. Negative for fever.  HENT: Negative.    Eyes: Negative.   Respiratory:  Positive for cough and shortness of breath.   Gastrointestinal: Negative.   Genitourinary: Negative.   Musculoskeletal: Negative.   Skin: Negative.   Neurological: Negative.   Endo/Heme/Allergies: Negative.   Psychiatric/Behavioral: Negative.    All other systems reviewed and are negative.   Past Medical  History:  Diagnosis Date   Anxiety    Arthritis    Bipolar disorder (HCC)    COPD (chronic obstructive pulmonary disease) (HCC)    Diabetes mellitus type II    Esophageal stricture    moderate distal   GERD (gastroesophageal reflux disease)    Heart murmur    "HAD AN ECHO YEARS AGO- NOTHING TO BE CONCERNED ABOUT"   Hyperlipemia    Hypertension    Hypothyroidism    Mental disorder    Bipolar   Obesity    Oxygen desaturation during sleep    wears 2 liters at bedtime    Schizophrenia ([place])    patient reports that she is unaware of this   Shortness of breath    with a little activy   Sleep apnea    wears 2 liters of oxygen at bedtime instead of CPAP    Past Surgical History:  Procedure Laterality Date   BALLOON DILATION N/A 06/10/2016   Procedure: BALLOON DILATION;  Surgeon: Wilford Corner, MD;  Location: [facility];  Service: Endoscopy;  Laterality: N/A;   BREAST EXCISIONAL BIOPSY Left 05/10/2012   benign   CHOLECYSTECTOMY     COLONOSCOPY WITH PROPOFOL N/A 01/30/2017   Procedure: COLONOSCOPY WITH PROPOFOL;  Surgeon: Wilford Corner, MD;  Location: [facility];  Service: Endoscopy;  Laterality: N/A;   COLONOSCOPY WITH PROPOFOL N/A 09/29/2020   Procedure: COLONOSCOPY WITH PROPOFOL;  Surgeon: Clarene Essex, MD;  Location: [facility];  Service: Endoscopy;  Laterality: N/A;   ESOPHAGEAL MANOMETRY N/A 12/19/2020   Procedure: ESOPHAGEAL MANOMETRY (EM);  Surgeon: Michail Sermon,  Evette Doffing, MD;  Location: [facility];  Service: Endoscopy;  Laterality: N/A;   ESOPHAGOGASTRODUODENOSCOPY N/A 06/03/2016   Procedure: ESOPHAGOGASTRODUODENOSCOPY (EGD);  Surgeon: Wilford Corner, MD;  Location: [facility] ENDOSCOPY;  Service: Endoscopy;  Laterality: N/A;   ESOPHAGOGASTRODUODENOSCOPY N/A 06/10/2016   Procedure: ESOPHAGOGASTRODUODENOSCOPY (EGD);  Surgeon: Wilford Corner, MD;  [facility];  Service: Endoscopy;  Laterality: N/A;   ESOPHAGOGASTRODUODENOSCOPY (EGD) WITH PROPOFOL N/A 09/28/2020    Procedure: ESOPHAGOGASTRODUODENOSCOPY (EGD) WITH PROPOFOL;  Surgeon: Arta Silence, MD;  Location: [facility];  Service: Endoscopy;  Laterality: N/A;   EYE SURGERY     PARTIAL MASTECTOMY WITH NEEDLE LOCALIZATION Left 05/10/2012   Procedure: PARTIAL MASTECTOMY WITH NEEDLE LOCALIZATION;  Surgeon: Adin Hector, MD;  Location: [place];  Service: General;  Laterality: Left;   POLYPECTOMY  09/29/2020   Procedure: POLYPECTOMY;  Surgeon: Clarene Essex, MD;  Location: [facility] ENDOS[facility];  Service: Endoscopy;;   TONSILLECTOMY       reports that she quit smoking about 28 years ago. Her smoking use included cigarettes. She has a 87.00 pack-year smoking history. She has never used smokeless tobacco. She reports that she does not drink alcohol and does not use drugs.  Allergies  Allergen Reactions   Darvocet [Propoxyphene N-Acetaminophen] Anaphylaxis    Can take plain tylenol   Fluoxetine Other (See Comments)    Hallucinations    Penicillins Rash    REACTION: rash   Promethazine Hives    Family History  Problem Relation Age of Onset   Alzheimer's disease Mother    Alcohol abuse Father    Stroke Father    Sleep apnea Sister    Restless legs syndrome Neg Hx     Prior to Admission medications   Medication Sig Start Date End Date Taking? Authorizing Provider  acetaminophen (TYLENOL) 325 MG tablet Take 650 mg by mouth every 6 (six) hours as needed for moderate pain.    [provider]  albuterol (PROVENTIL) (2.5 MG/3ML) 0.083% nebulizer solution Take 2.5 mg by nebulization every 6 (six) hours as needed for wheezing or shortness of breath. 03/13/21   [provider]  bumetanide (BUMEX) 1 MG tablet Take 1 tablet (1 mg total) by mouth daily. 12/04/12   Delfina Redwood, MD  chlorpheniramine-HYDROcodone (TUSSIONEX) 10-8 MG/5ML SUER Take 5 mLs by mouth every 12 (twelve) hours as needed for cough. cough Patient not taking: Reported on 12/05/2021 03/14/21   [provider]   Cholecalciferol (VITAMIN D3) 5000 units TABS Take 5,000 Units by mouth daily.    [provider]  clonazePAM (KLONOPIN) 1 MG tablet Take 1 tablet (1 mg total) by mouth at bedtime. 12/09/21   Arfeen, Arlyce Harman, MD  diclofenac sodium (VOLTAREN) 1 % GEL Apply 2 g topically 4 (four) times daily as needed (pain).    [provider]  Ensure Max Protein (ENSURE MAX PROTEIN) LIQD Take 330 mLs (11 oz total) by mouth 2 (two) times daily. 03/23/21   Sheikh, Omair Latif, DO  FEROSUL 325 (65 Fe) MG tablet Take 325 mg by mouth daily. 06/21/18   [provider]  fluticasone furoate-vilanterol (BREO ELLIPTA) 200-25 MCG/INH AEPB Inhale 1 puff into the lungs every morning.    [provider]  insulin aspart (NOVOLOG FLEXPEN) 100 UNIT/ML FlexPen Inject 2-12 Units into the skin 3 (three) times daily with meals. Sliding scale    [provider]  LEVEMIR 100 UNIT/ML injection Inject 30 Units into the skin at bedtime. 07/21/18   [provider]  levothyroxine (SYNTHROID)  125 MCG tablet Take 125 mcg by mouth daily before breakfast. 07/21/18   [provider]  metFORMIN (GLUCOPHAGE) 500 MG tablet Take 500 mg by mouth 2 (two) times daily with a meal. 01/23/20   [provider]  metoprolol tartrate (LOPRESSOR) 25 MG tablet Take 0.5 tablets (12.5 mg total) by mouth 2 (two) times daily. 03/23/21   Sheikh, Omair Latif, DO  montelukast (SINGULAIR) 10 MG tablet Take 10 mg by mouth daily. 01/23/20   [provider]  Multiple Vitamin (MULTIVITAMIN WITH MINERALS) TABS tablet Take 1 tablet by mouth daily. 03/24/21   Raiford Noble Latif, DO  nystatin ointment (MYCOSTATIN) Apply 1 application topically 2 (two) times daily as needed (rash).    [provider]  omeprazole (PRILOSEC) 40 MG capsule Take 40 mg by mouth daily. 01/23/20   [provider]  ondansetron (ZOFRAN) 4 MG tablet Take 1 tablet (4 mg total) by mouth every 6 (six) hours as needed  for nausea. 03/23/21   Sheikh, Omair Latif, DO  OZEMPIC, 1 MG/DOSE, 4 MG/3ML SOPN Inject 1 mg into the skin once a week. tuesdays 01/23/20   [provider]  PARoxetine (PAXIL) 40 MG tablet Take 1 tablet (40 mg total) by mouth every morning. 12/09/21   Arfeen, Arlyce Harman, MD  polyethylene glycol (MIRALAX / GLYCOLAX) 17 g packet Take 17 g by mouth daily as needed for mild constipation. 03/23/21   Sheikh, Omair Latif, DO  rOPINIRole (REQUIP) 0.25 MG tablet TAKE 3 TABLETS BY MOUTH AT BEDTIME 12/10/21   Ward Givens, NP  rosuvastatin (CRESTOR) 10 MG tablet Take 1 tablet by mouth at bedtime. 09/16/21   Schmerge, Meredeth Ide, NP  rosuvastatin (CRESTOR) 5 MG tablet Take 5 mg by mouth daily. 07/20/18   [provider]  traZODone (DESYREL) 100 MG tablet Take 1 tablet (100 mg total) by mouth at bedtime. 12/09/21   Arfeen, Arlyce Harman, MD  TRUEPLUS INSULIN SYRINGE 29G X 1/2" 1 ML MISC 1 each by Other route daily. 07/16/18   [provider]  VENTOLIN HFA 108 (90 Base) MCG/ACT inhaler Inhale 1-2 puffs into the lungs every 4 (four) hours as needed for wheezing or shortness of breath. 07/21/18   [provider]  ziprasidone (GEODON) 80 MG capsule Take 1 capsule (80 mg total) by mouth at bedtime. 12/09/21   Kathlee Nations, MD    Physical Exam: Vitals:   02/18/22 1815 02/18/22 1845 02/18/22 1930 02/18/22 2000  BP: (!) 141/73 (!) 172/59 (!) 173/64 (!) 150/65  Pulse: 63 63 66 63  Resp: 14 (!) '21 16 11  '$ Temp:      TempSrc:      SpO2: 100% 100% 100% 100%  Weight:      Height:        Physical Exam Vitals and nursing note reviewed.  Constitutional:      General: She is not in acute distress.    Appearance: She is obese. She is not ill-appearing, toxic-appearing or diaphoretic.  HENT:     Head: Normocephalic and atraumatic.  Cardiovascular:     Rate and Rhythm: Normal rate and regular rhythm.     Pulses: Normal pulses.  Pulmonary:     Effort: Tachypnea present. No accessory muscle  usage, prolonged expiration or respiratory distress.     Breath sounds: Examination of the right-upper field reveals decreased breath sounds. Examination of the left-upper field reveals decreased breath sounds. Examination of the right-middle field reveals decreased breath sounds. Examination of the left-middle field  reveals decreased breath sounds. Examination of the right-lower field reveals decreased breath sounds. Examination of the left-lower field reveals decreased breath sounds. Decreased breath sounds present.  Abdominal:     General: Bowel sounds are normal. There is no distension.     Palpations: Abdomen is soft.     Tenderness: There is no abdominal tenderness. There is no rebound.  Musculoskeletal:     Right lower leg: No edema.     Left lower leg: No edema.  Skin:    General: Skin is warm and dry.     Capillary Refill: Capillary refill takes less than 2 seconds.  Neurological:     General: No focal deficit present.     Mental Status: She is alert and oriented to person, place, and time.      Labs on Admission: I have personally reviewed following labs and imaging studies  CBC: Recent Labs  Lab 02/18/22 1630  WBC 4.6  NEUTROABS 2.5  HGB 8.2*  HCT 27.5*  MCV 81.6  PLT 540   Basic Metabolic Panel: Recent Labs  Lab 02/18/22 1630  NA 139  K 4.4  CL 104  CO2 21*  GLUCOSE 230*  BUN 20  CREATININE 1.55*  CALCIUM 8.9   GFR: Estimated Creatinine Clearance: 35.5 mL/min (A) (by C-G formula based on SCr of 1.55 mg/dL (H)). Liver Function Tests: Recent Labs  Lab 02/18/22 1630  AST 21  ALT 12  ALKPHOS 78  BILITOT 0.5  PROT 6.2*  ALBUMIN 2.8*   No results for input(s): "LIPASE", "AMYLASE" in the last 168 hours. No results for input(s): "AMMONIA" in the last 168 hours. Coagulation Profile: No results for input(s): "INR", "PROTIME" in the last 168 hours. Cardiac Enzymes: Recent Labs  Lab 02/18/22 1630 02/18/22 1843  TROPONINIHS 24* 19*   BNP (last 3  results) No results for input(s): "PROBNP" in the last 8760 hours. HbA1C: No results for input(s): "HGBA1C" in the last 72 hours. CBG: No results for input(s): "GLUCAP" in the last 168 hours. Lipid Profile: No results for input(s): "CHOL", "HDL", "LDLCALC", "TRIG", "CHOLHDL", "LDLDIRECT" in the last 72 hours. Thyroid Function Tests: No results for input(s): "TSH", "T4TOTAL", "FREET4", "T3FREE", "THYROIDAB" in the last 72 hours. Anemia Panel: No results for input(s): "VITAMINB12", "FOLATE", "FERRITIN", "TIBC", "IRON", "RETICCTPCT" in the last 72 hours. Urine analysis:    Component Value Date/Time   COLORURINE YELLOW 09/27/2020 1846   APPEARANCEUR CLEAR 09/27/2020 1846   LABSPEC 1.005 09/27/2020 1846   PHURINE 6.0 09/27/2020 1846   GLUCOSEU NEGATIVE 09/27/2020 1846   HGBUR NEGATIVE 09/27/2020 1846   BILIRUBINUR NEGATIVE 09/27/2020 1846   KETONESUR NEGATIVE 09/27/2020 1846   PROTEINUR NEGATIVE 09/27/2020 1846   UROBILINOGEN 1.0 12/03/2012 1531   NITRITE NEGATIVE 09/27/2020 1846   LEUKOCYTESUR LARGE (A) 09/27/2020 1846    Radiological Exams on Admission: I have personally reviewed images DG Chest Portable 1 View  Result Date: 02/18/2022 CLINICAL DATA:  Shortness of breath EXAM: PORTABLE CHEST 1 VIEW COMPARISON:  Chest x-ray 03/19/2021 FINDINGS: The heart size and mediastinal contours are within normal limits. Both lungs are clear. The visualized skeletal structures are unremarkable. IMPRESSION: No active disease. Electronically Signed   By: Ronney Asters M.D.   On: 02/18/2022 17:07    EKG: My personal interpretation of EKG shows: junctional rhythm    Assessment/Plan Principal Problem:   Acute respiratory failure with hypoxia (HCC) Active Problems:   COVID-19 virus infection   Essential hypertension   Type 2 diabetes mellitus without complication (  [place])   Morbid obesity (HCC)   COPD (chronic obstructive pulmonary disease) (HCC)   OSA (obstructive sleep apnea)   Stage 3a  chronic kidney disease (CKD) (HCC)- Baseline creatinine 1.3-1.5   Chronic diastolic CHF (congestive heart failure) ([place])   DNR (do not resuscitate)/DNI(Do Not Intubate)    Assessment and Plan: * Acute respiratory failure with hypoxia ([place]) Assigned to observation progressive bed.  Continue with as needed BiPAP.  Continue with supplemental oxygen.  Patient states that she does not use oxygen at home.  COVID-19 virus infection Start Decadron 6 mg daily.  Start molnupiravir 800 mg twice daily.  Continue with droplet/airborne precautions.  Chronic diastolic CHF (congestive heart failure) (HCC) Stable.  Euvolemic.  Continue Bumex 1 mg daily.  Stage 3a chronic kidney disease (CKD) ([place])- Baseline creatinine 1.3-1.5 Stable.  Baseline creatinine 1.3-1.5  OSA (obstructive sleep apnea) Chronic.  Patient states that she uses BiPAP at night.  COPD (chronic obstructive pulmonary disease) ([place]) Likely exacerbated due to COVID infection.  Continue Decadron.  Continue DuoNebs every 6 hours.  Morbid obesity (HCC) Chronic.  BMI 35.93.  Type 2 diabetes mellitus without complication (HCC) Stable.  Continue with long-acting and short acting insulins.  Hold metformin.  Hold Ozempic.  Essential hypertension Stable.  Hold Lopressor due to junctional rhythm.  Start Norvasc 5 mg for hypertension control.  DNR (do not resuscitate)/DNI(Do Not Intubate) Verified with the patient that she wants to be DNR/DNI.  No family at bedside.   DVT prophylaxis: SQ Heparin Code Status: DNR/DNI(Do NOT Intubate) verified with patient. Family Communication: no family at bedside  Disposition Plan: return home  Consults called: none  Admission status: Observation,  progressive   Kristopher Oppenheim, DO [facility] 02/18/2022, 8:29 PM

## 2022-02-18 NOTE — Assessment & Plan Note (Signed)
Chronic.  Patient states that she uses BiPAP at night.

## 2022-02-18 NOTE — Assessment & Plan Note (Signed)
Chronic.  BMI 35.93.

## 2022-02-18 NOTE — Assessment & Plan Note (Signed)
Likely exacerbated due to COVID infection.  Continue Decadron.  Continue DuoNebs every 6 hours.

## 2022-02-18 NOTE — Subjective & Objective (Signed)
Chief complaint:Shortness of breath History present illness: 71 year old female history of type 2 diabetes, hypertension, morbid obesity, OSA on BiPAP at night, history of bipolar 1 disorder presents to the ER today with a 2-day history of shortness of breath.  Patient states that her sister and her brother-in-law whom she lives with are ill.  They have had upper respiratory symptoms for the last week.  Patient's had some chills but no documented fever.  EMS was sent out to the house due to dyspnea.  Reportedly patient had respiratory decompensation in route and was started on BiPAP.  Patient herself denies using oxygen at home although EMS reported the patient does have oxygen supply at home.  In the ER, patient was continued on BiPAP.  COVID test was positive.  Chest x-ray is negative.  Patient was anemic with hemoglobin 8.2 but this is chronic.  Serum creatinine of 1.55.  Baseline creatinine 1.35-1.46.  BNP was normal at 76.  Chest x-ray was negative for infiltrates.  [facility] hospitalist contacted for admission due to positive COVID test and continued tachypnea.

## 2022-02-18 NOTE — Assessment & Plan Note (Signed)
Assigned to observation progressive bed.  Continue with as needed BiPAP.  Continue with supplemental oxygen.  Patient states that she does not use oxygen at home.

## 2022-02-18 NOTE — Assessment & Plan Note (Signed)
Stable.  Euvolemic.  Continue Bumex 1 mg daily.

## 2022-02-18 NOTE — ED Provider Notes (Signed)
[facility] EMERGENCY DEPARTMENT Provider Note   CSN: [id number] Arrival date & time: 02/18/22  1612     History  Chief Complaint  Patient presents with   Shortness of Breath    Chelsea [name] is a 72 y.o. female.   Shortness of Breath Patient is under shortness of breath.  Has had cough and some respiratory symptoms for the last 3 days.  History of COPD and CHF.  Not hypoxic but was dyspneic for EMS.  Reportedly members been sick 2.  Was hypertensive.  Has been given albuterol Atrovent and nitro glycerin.  Had been started BiPAP.  Has had a cough without production.  No chest pain.  Per EMS has had oxygen at home but does not use it most of the time.     Home Medications Prior to Admission medications   Medication Sig Start Date End Date Taking? Authorizing Provider  acetaminophen (TYLENOL) 325 MG tablet Take 650 mg by mouth every 6 (six) hours as needed for moderate pain.   Yes [provider]  amLODipine (NORVASC) 2.5 MG tablet Take 2.5 mg by mouth daily.   Yes [provider]  clonazePAM (KLONOPIN) 1 MG tablet Take 1 tablet (1 mg total) by mouth at bedtime. 12/09/21  Yes Arfeen, Arlyce Harman, MD  diclofenac sodium (VOLTAREN) 1 % GEL Apply 2 g topically 4 (four) times daily as needed (pain).   Yes [provider]  Ensure Max Protein (ENSURE MAX PROTEIN) LIQD Take 330 mLs (11 oz total) by mouth 2 (two) times daily. 03/23/21  Yes Sheikh, Omair Latif, DO  ferrous gluconate (FERGON) 324 MG tablet Take 324 mg by mouth daily with breakfast.   Yes [provider]  fluticasone (FLONASE) 50 MCG/ACT nasal spray Place 2 sprays into both nostrils daily.   Yes [provider]  fluticasone furoate-vilanterol (BREO ELLIPTA) 200-25 MCG/INH AEPB Inhale 1 puff into the lungs every morning.   Yes [provider]  insulin aspart (NOVOLOG FLEXPEN) 100 UNIT/ML FlexPen Inject 2-12 Units into the skin 3 (three) times daily with meals.  Sliding scale   Yes [provider]  LEVEMIR 100 UNIT/ML injection Inject 30 Units into the skin at bedtime. 07/21/18  Yes [provider]  levothyroxine (SYNTHROID) 125 MCG tablet Take 125 mcg by mouth daily before breakfast. 07/21/18  Yes [provider]  metFORMIN (GLUCOPHAGE) 500 MG tablet Take 500 mg by mouth in the morning and at bedtime. 01/23/20  Yes [provider]  metoprolol succinate (TOPROL-XL) 100 MG 24 hr tablet Take 100 mg by mouth in the morning. Take with or immediately following a meal.   Yes [provider]  montelukast (SINGULAIR) 10 MG tablet Take 10 mg by mouth daily. 01/23/20  Yes [provider]  Multiple Vitamin (MULTIVITAMIN WITH MINERALS) TABS tablet Take 1 tablet by mouth daily. 03/24/21  Yes Sheikh, Omair Latif, DO  nystatin ointment (MYCOSTATIN) Apply 1 application  topically 2 (two) times daily as needed (for rashes).   Yes [provider]  omeprazole (PRILOSEC) 40 MG capsule Take 40 mg by mouth daily before breakfast. 01/23/20  Yes [provider]  OZEMPIC, 2 MG/DOSE, 8 MG/3ML SOPN Inject 2 mg into the skin every Friday.   Yes [provider]  PARoxetine (PAXIL) 40 MG tablet Take 1 tablet (40 mg total) by mouth every morning. 12/09/21  Yes Arfeen, Arlyce Harman, MD  PRESCRIPTION MEDICATION BiPAP- At bedtime   Yes [provider]  rOPINIRole (REQUIP)  0.25 MG tablet TAKE 3 TABLETS BY MOUTH AT BEDTIME Patient taking differently: Take 0.75 mg by mouth at bedtime. 12/10/21  Yes Ward Givens, NP  rosuvastatin (CRESTOR) 10 MG tablet Take 10 mg by mouth at bedtime. 09/16/21  Yes Schmerge, Meredeth Ide, NP  traZODone (DESYREL) 100 MG tablet Take 1 tablet (100 mg total) by mouth at bedtime. 12/09/21  Yes Arfeen, Arlyce Harman, MD  VENTOLIN HFA 108 (90 Base) MCG/ACT inhaler Inhale 1-2 puffs into the lungs every 4 (four) hours as needed for wheezing or shortness of breath. 07/21/18  Yes [provider]   ziprasidone (GEODON) 80 MG capsule Take 1 capsule (80 mg total) by mouth at bedtime. 12/09/21  Yes Arfeen, Arlyce Harman, MD  albuterol (PROVENTIL) (2.5 MG/3ML) 0.083% nebulizer solution Take 2.5 mg by nebulization every 6 (six) hours as needed for wheezing or shortness of breath. 03/13/21   [provider]  bumetanide (BUMEX) 1 MG tablet Take 1 tablet (1 mg total) by mouth daily. 12/04/12   Delfina Redwood, MD  chlorpheniramine-HYDROcodone (TUSSIONEX) 10-8 MG/5ML SUER Take 5 mLs by mouth every 12 (twelve) hours as needed for cough. cough 03/14/21   [provider]  Cholecalciferol (VITAMIN D3) 5000 units TABS Take 5,000 Units by mouth daily.    [provider]  metoprolol tartrate (LOPRESSOR) 25 MG tablet Take 0.5 tablets (12.5 mg total) by mouth 2 (two) times daily. Patient not taking: Reported on 02/18/2022 03/23/21   Raiford Noble Latif, DO  ondansetron (ZOFRAN) 4 MG tablet Take 1 tablet (4 mg total) by mouth every 6 (six) hours as needed for nausea. 03/23/21   Raiford Noble Latif, DO  polyethylene glycol (MIRALAX / GLYCOLAX) 17 g packet Take 17 g by mouth daily as needed for mild constipation. Patient not taking: Reported on 02/18/2022 03/23/21   Raiford Noble Latif, DO  TRUEPLUS INSULIN SYRINGE 29G X 1/2" 1 ML MISC 1 each by Other route daily. 07/16/18   [provider]      Allergies    Darvocet [propoxyphene n-acetaminophen], Fluoxetine, Penicillins, and Promethazine    Review of Systems   Review of Systems  Respiratory:  Positive for shortness of breath.     Physical Exam Updated Vital Signs BP (!) 169/67   Pulse 75   Temp 98.1 F (36.7 C)   Resp 16   Ht '5\' 2"'$  (1.575 m)   Wt 89.1 kg   SpO2 100%   BMI 35.93 kg/m  Physical Exam Vitals and nursing note reviewed.  HENT:     Head: Atraumatic.  Cardiovascular:     Rate and Rhythm: Normal rate.  Pulmonary:     Comments: Harsh breath sounds.  Initially had wheezes but potentially some rales at  the bases. Chest:     Chest wall: No tenderness.  Abdominal:     Tenderness: There is no abdominal tenderness.  Musculoskeletal:     Comments: Mild edema bilateral lower extremities.  Skin:    Capillary Refill: Capillary refill takes less than 2 seconds.  Neurological:     Mental Status: She is alert and oriented to person, place, and time.     ED Results / Procedures / Treatments   Labs (all labs ordered are listed, but only abnormal results are displayed) Labs Reviewed  RESP PANEL BY RT-PCR (FLU A&B, COVID) ARPGX2 - Abnormal; Notable for the following components:      Result Value   SARS Coronavirus 2 by RT PCR POSITIVE (*)    All other  components within normal limits  COMPREHENSIVE METABOLIC PANEL - Abnormal; Notable for the following components:   CO2 21 (*)    Glucose, Bld 230 (*)    Creatinine, Ser 1.55 (*)    Total Protein 6.2 (*)    Albumin 2.8 (*)    GFR, Estimated 36 (*)    All other components within normal limits  CBC WITH DIFFERENTIAL/PLATELET - Abnormal; Notable for the following components:   RBC 3.37 (*)    Hemoglobin 8.2 (*)    HCT 27.5 (*)    MCH 24.3 (*)    MCHC 29.8 (*)    All other components within normal limits  CBG MONITORING, ED - Abnormal; Notable for the following components:   Glucose-Capillary 177 (*)    All other components within normal limits  TROPONIN I (HIGH SENSITIVITY) - Abnormal; Notable for the following components:   Troponin I (High Sensitivity) 24 (*)    All other components within normal limits  TROPONIN I (HIGH SENSITIVITY) - Abnormal; Notable for the following components:   Troponin I (High Sensitivity) 19 (*)    All other components within normal limits  BRAIN NATRIURETIC PEPTIDE  HEMOGLOBIN A1C  COMPREHENSIVE METABOLIC PANEL  CBC WITH DIFFERENTIAL/PLATELET  MAGNESIUM    EKG None  Radiology DG Chest Portable 1 View  Result Date: 02/18/2022 CLINICAL DATA:  Shortness of breath EXAM: PORTABLE CHEST 1 VIEW  COMPARISON:  Chest x-ray 03/19/2021 FINDINGS: The heart size and mediastinal contours are within normal limits. Both lungs are clear. The visualized skeletal structures are unremarkable. IMPRESSION: No active disease. Electronically Signed   By: Ronney Asters M.D.   On: 02/18/2022 17:07    Procedures Procedures    Medications Ordered in ED Medications  molnupiravir EUA (LAGEVRIO) capsule 800 mg (800 mg Oral Given 02/18/22 2144)  bumetanide (BUMEX) tablet 1 mg (has no administration in time range)  levothyroxine (SYNTHROID) tablet 125 mcg (has no administration in time range)  pantoprazole (PROTONIX) EC tablet 40 mg (40 mg Oral Given 02/18/22 2145)  ipratropium-albuterol (DUONEB) 0.5-2.5 (3) MG/3ML nebulizer solution 3 mL (3 mLs Nebulization Given 02/18/22 2146)  dexamethasone (DECADRON) tablet 6 mg (has no administration in time range)  insulin glargine-yfgn (SEMGLEE) injection 10 Units (has no administration in time range)  insulin aspart (novoLOG) injection 0-15 Units (has no administration in time range)  insulin aspart (novoLOG) injection 0-5 Units ( Subcutaneous Not Given 02/18/22 2217)  heparin injection 5,000 Units (5,000 Units Subcutaneous Given 02/18/22 2145)  acetaminophen (TYLENOL) tablet 650 mg (has no administration in time range)    Or  acetaminophen (TYLENOL) suppository 650 mg (has no administration in time range)  ondansetron (ZOFRAN) tablet 4 mg (has no administration in time range)    Or  ondansetron (ZOFRAN) injection 4 mg (has no administration in time range)  melatonin tablet 10 mg (has no administration in time range)  amLODipine (NORVASC) tablet 5 mg (has no administration in time range)  dexamethasone (DECADRON) injection 10 mg (10 mg Intravenous Given 02/18/22 2051)    ED Course/ Medical Decision Making/ A&P                           Medical Decision Making Amount and/or Complexity of Data Reviewed Labs: ordered. Radiology: ordered.  Risk Decision  regarding hospitalization.   Patient shortness of breath.  On BiPAP on arrival.  Initial sats stayed high but with her dyspnea will start back on BiPAP.  Will evaluate for different  causes of shortness of breath such as COPD, CHF, pneumonia, COVID.  We will get basic blood work and x-ray.  Chest x-ray independently interpreted and reassuring.  Blood work also reassuring.  BNP is normal.  Unfortunately is COVID-positive.  With the dyspnea that had required BiPAP I feel she benefit from mission to the hospital.  Has titrated off BiPAP at this time but still feeling somewhat short of breath. Will admit to hospitalist.        Final Clinical Impression(s) / ED Diagnoses Final diagnoses:  [id number]    Rx / DC Orders ED Discharge Orders     None         Davonna Belling, MD 02/18/22 2228

## 2022-02-18 NOTE — ED Triage Notes (Signed)
From home with home health. Family at home ha been sick with resp problems SOB worsened the last 3 days despite breathing treatments pt declined in route and is on bipap at this time.  COPD and asthma with CHF  200/102 Now 174/70 5 albuterol  0.5 of Atrovent I SL nirtro given

## 2022-02-18 NOTE — Assessment & Plan Note (Addendum)
Start Decadron 6 mg daily.  Start molnupiravir 800 mg twice daily.  Continue with droplet/airborne precautions.

## 2022-02-18 NOTE — Assessment & Plan Note (Signed)
Verified with the patient that she wants to be DNR/DNI.  No family at bedside.

## 2022-02-18 NOTE — Assessment & Plan Note (Signed)
Stable. Baseline creatinine 1.3-1.5. 

## 2022-02-19 ENCOUNTER — Observation Stay (HOSPITAL_COMMUNITY): Payer: Medicare Other

## 2022-02-19 ENCOUNTER — Encounter (HOSPITAL_COMMUNITY): Payer: Self-pay | Admitting: Internal Medicine

## 2022-02-19 DIAGNOSIS — E1165 Type 2 diabetes mellitus with hyperglycemia: Secondary | ICD-10-CM | POA: Diagnosis present

## 2022-02-19 DIAGNOSIS — E785 Hyperlipidemia, unspecified: Secondary | ICD-10-CM | POA: Diagnosis present

## 2022-02-19 DIAGNOSIS — I5032 Chronic diastolic (congestive) heart failure: Secondary | ICD-10-CM | POA: Diagnosis present

## 2022-02-19 DIAGNOSIS — U071 COVID-19: Secondary | ICD-10-CM | POA: Diagnosis present

## 2022-02-19 DIAGNOSIS — Z79899 Other long term (current) drug therapy: Secondary | ICD-10-CM | POA: Diagnosis not present

## 2022-02-19 DIAGNOSIS — Z794 Long term (current) use of insulin: Secondary | ICD-10-CM | POA: Diagnosis not present

## 2022-02-19 DIAGNOSIS — N1831 Chronic kidney disease, stage 3a: Secondary | ICD-10-CM | POA: Diagnosis present

## 2022-02-19 DIAGNOSIS — D631 Anemia in chronic kidney disease: Secondary | ICD-10-CM | POA: Diagnosis present

## 2022-02-19 DIAGNOSIS — G4733 Obstructive sleep apnea (adult) (pediatric): Secondary | ICD-10-CM | POA: Diagnosis present

## 2022-02-19 DIAGNOSIS — J9601 Acute respiratory failure with hypoxia: Secondary | ICD-10-CM | POA: Diagnosis present

## 2022-02-19 DIAGNOSIS — Z66 Do not resuscitate: Secondary | ICD-10-CM | POA: Diagnosis present

## 2022-02-19 DIAGNOSIS — T380X5A Adverse effect of glucocorticoids and synthetic analogues, initial encounter: Secondary | ICD-10-CM | POA: Diagnosis present

## 2022-02-19 DIAGNOSIS — E1122 Type 2 diabetes mellitus with diabetic chronic kidney disease: Secondary | ICD-10-CM | POA: Diagnosis present

## 2022-02-19 DIAGNOSIS — F319 Bipolar disorder, unspecified: Secondary | ICD-10-CM | POA: Diagnosis present

## 2022-02-19 DIAGNOSIS — J441 Chronic obstructive pulmonary disease with (acute) exacerbation: Secondary | ICD-10-CM | POA: Diagnosis present

## 2022-02-19 DIAGNOSIS — F200 Paranoid schizophrenia: Secondary | ICD-10-CM | POA: Diagnosis present

## 2022-02-19 DIAGNOSIS — Z6835 Body mass index (BMI) 35.0-35.9, adult: Secondary | ICD-10-CM | POA: Diagnosis not present

## 2022-02-19 DIAGNOSIS — F411 Generalized anxiety disorder: Secondary | ICD-10-CM | POA: Diagnosis present

## 2022-02-19 DIAGNOSIS — I130 Hypertensive heart and chronic kidney disease with heart failure and stage 1 through stage 4 chronic kidney disease, or unspecified chronic kidney disease: Secondary | ICD-10-CM | POA: Diagnosis present

## 2022-02-19 DIAGNOSIS — E039 Hypothyroidism, unspecified: Secondary | ICD-10-CM | POA: Diagnosis present

## 2022-02-19 DIAGNOSIS — D72819 Decreased white blood cell count, unspecified: Secondary | ICD-10-CM | POA: Diagnosis present

## 2022-02-19 DIAGNOSIS — Z885 Allergy status to narcotic agent status: Secondary | ICD-10-CM | POA: Diagnosis not present

## 2022-02-19 LAB — COMPREHENSIVE METABOLIC PANEL
ALT: 13 U/L (ref 0–44)
AST: 18 U/L (ref 15–41)
Albumin: 2.5 g/dL — ABNORMAL LOW (ref 3.5–5.0)
Alkaline Phosphatase: 72 U/L (ref 38–126)
Anion gap: 9 (ref 5–15)
BUN: 21 mg/dL (ref 8–23)
CO2: 20 mmol/L — ABNORMAL LOW (ref 22–32)
Calcium: 7.9 mg/dL — ABNORMAL LOW (ref 8.9–10.3)
Chloride: 107 mmol/L (ref 98–111)
Creatinine, Ser: 1.48 mg/dL — ABNORMAL HIGH (ref 0.44–1.00)
GFR, Estimated: 38 mL/min — ABNORMAL LOW (ref >60)
Glucose, Bld: 320 mg/dL — ABNORMAL HIGH (ref 70–99)
Potassium: 3.9 mmol/L (ref 3.5–5.1)
Sodium: 136 mmol/L (ref 135–145)
Total Bilirubin: 0.5 mg/dL (ref 0.3–1.2)
Total Protein: 5.5 g/dL — ABNORMAL LOW (ref 6.5–8.1)

## 2022-02-19 LAB — CBG MONITORING, ED
Glucose-Capillary: 333 mg/dL — ABNORMAL HIGH (ref 70–99)
Glucose-Capillary: 294 mg/dL — ABNORMAL HIGH (ref 70–99)
Glucose-Capillary: 238 mg/dL — ABNORMAL HIGH (ref 70–99)

## 2022-02-19 LAB — CBC WITH DIFFERENTIAL/PLATELET
Abs Immature Granulocytes: 0.02 10*3/uL (ref 0.00–0.07)
Basophils Absolute: 0 10*3/uL (ref 0.0–0.1)
Basophils Relative: 0 %
Eosinophils Absolute: 0 10*3/uL (ref 0.0–0.5)
Eosinophils Relative: 0 %
HCT: 25.5 % — ABNORMAL LOW (ref 36.0–46.0)
Hemoglobin: 7.9 g/dL — ABNORMAL LOW (ref 12.0–15.0)
Immature Granulocytes: 1 %
Lymphocytes Relative: 13 %
Lymphs Abs: 0.5 10*3/uL — ABNORMAL LOW (ref 0.7–4.0)
MCH: 25 pg — ABNORMAL LOW (ref 26.0–34.0)
MCHC: 31 g/dL (ref 30.0–36.0)
MCV: 80.7 fL (ref 80.0–100.0)
Monocytes Absolute: 0.1 10*3/uL (ref 0.1–1.0)
Monocytes Relative: 3 %
Neutro Abs: 2.9 10*3/uL (ref 1.7–7.7)
Neutrophils Relative %: 83 %
Platelets: 228 10*3/uL (ref 150–400)
RBC: 3.16 MIL/uL — ABNORMAL LOW (ref 3.87–5.11)
RDW: 13.5 % (ref 11.5–15.5)
WBC: 3.5 10*3/uL — ABNORMAL LOW (ref 4.0–10.5)
nRBC: 0 % (ref 0.0–0.2)

## 2022-02-19 LAB — GLUCOSE, CAPILLARY: Glucose-Capillary: 254 mg/dL — ABNORMAL HIGH (ref 70–99)

## 2022-02-19 LAB — MAGNESIUM: Magnesium: 1.5 mg/dL — ABNORMAL LOW (ref 1.7–2.4)

## 2022-02-19 MED ORDER — INSULIN ASPART 100 UNIT/ML IJ SOLN
4.0000 [IU] | Freq: Three times a day (TID) | INTRAMUSCULAR | Status: DC
  Administered 2022-02-19 – 2022-02-20 (×3): 4 [IU] via SUBCUTANEOUS

## 2022-02-19 MED ORDER — ZIPRASIDONE HCL 80 MG PO CAPS
80.0000 mg | ORAL_CAPSULE | Freq: Every day | ORAL | Status: DC
  Filled 2022-02-19 (×3): qty 1

## 2022-02-19 MED ORDER — ALBUTEROL SULFATE (2.5 MG/3ML) 0.083% IN NEBU: 2.5000 mg | INHALATION_SOLUTION | RESPIRATORY_TRACT | Status: DC | PRN

## 2022-02-19 MED ORDER — CLONAZEPAM 0.5 MG PO TABS
0.5000 mg | ORAL_TABLET | Freq: Two times a day (BID) | ORAL | Status: DC | PRN
  Administered 2022-02-19: 0.5 mg via ORAL
  Filled 2022-02-19: qty 1

## 2022-02-19 MED ORDER — CLONAZEPAM 0.5 MG PO TABS
1.0000 mg | ORAL_TABLET | Freq: Every day | ORAL | Status: DC
  Administered 2022-02-19: 1 mg via ORAL
  Filled 2022-02-19: qty 2

## 2022-02-19 MED ORDER — INSULIN GLARGINE-YFGN 100 UNIT/ML ~~LOC~~ SOLN
15.0000 [IU] | Freq: Every day | SUBCUTANEOUS | Status: DC
  Administered 2022-02-19 – 2022-02-20 (×2): 15 [IU] via SUBCUTANEOUS
  Filled 2022-02-19 (×2): qty 0.15

## 2022-02-19 MED ORDER — METHYLPREDNISOLONE SODIUM SUCC 125 MG IJ SOLR
100.0000 mg | Freq: Every day | INTRAMUSCULAR | Status: DC
  Administered 2022-02-19: 100 mg via INTRAVENOUS
  Filled 2022-02-19: qty 2

## 2022-02-19 MED ORDER — MAGNESIUM SULFATE 2 GM/50ML IV SOLN
2.0000 g | Freq: Once | INTRAVENOUS | Status: AC
  Administered 2022-02-19: 2 g via INTRAVENOUS
  Filled 2022-02-19: qty 50

## 2022-02-19 MED ORDER — PAROXETINE HCL 20 MG PO TABS
40.0000 mg | ORAL_TABLET | Freq: Every morning | ORAL | Status: DC
  Administered 2022-02-20: 40 mg via ORAL
  Filled 2022-02-19 (×2): qty 2

## 2022-02-19 MED ORDER — ROPINIROLE HCL 0.5 MG PO TABS
0.7500 mg | ORAL_TABLET | Freq: Every day | ORAL | Status: DC
  Filled 2022-02-19 (×3): qty 1

## 2022-02-19 MED ORDER — TRAZODONE HCL 50 MG PO TABS
100.0000 mg | ORAL_TABLET | Freq: Every day | ORAL | Status: DC
  Administered 2022-02-19: 100 mg via ORAL
  Filled 2022-02-19: qty 2

## 2022-02-19 MED ORDER — METHYLPREDNISOLONE SODIUM SUCC 40 MG IJ SOLR
40.0000 mg | Freq: Every day | INTRAMUSCULAR | Status: DC
  Administered 2022-02-20: 40 mg via INTRAVENOUS
  Filled 2022-02-19: qty 1

## 2022-02-19 NOTE — Inpatient Diabetes Management (Signed)
Inpatient Diabetes Program Recommendations  AACE/ADA: New Consensus Statement on Inpatient Glycemic Control (2015)  Target Ranges:  Prepandial:   less than 140 mg/dL      Peak postprandial:   less than 180 mg/dL (1-2 hours)      Critically ill patients:  140 - 180 mg/dL   Lab Results  Component Value Date   GLUCAP 333 (H) 02/19/2022   HGBA1C 7.5 (H) 02/18/2022    Latest Reference Range & Units 02/18/22 21:41 02/19/22 08:02  Glucose-Capillary 70 - 99 mg/dL 177 (H) 333 (H)   Diabetes history: DM2 Outpatient Diabetes medications: Levemir 30 units, Novolog 2-12 units tid meal coverage, Ozempic 2 mg q week, Metformin 500 mg bid Current orders for Inpatient glycemic control: Semglee 10 units qd, Novolog 0-15 units tid, 0-5 units hs, Solumedrol 100 mg qd  Inpatient Diabetes Program Recommendations:   Patient currently in ED with dx Covid. -Increase Semglee to 15 units qd (50% home basal dose). Will probably need increase due to steroids.  Thank you, Nani Gasser. Brizeyda Holtmeyer, RN, MSN, CDE  Diabetes Coordinator Inpatient Glycemic Control Team Team Pager [phone] (8am-5pm) 02/19/2022 9:03 AM

## 2022-02-19 NOTE — ED Notes (Signed)
This NT

## 2022-02-19 NOTE — ED Notes (Signed)
Pt placed in locked  reclining chair with bedside table and call bell within reach.

## 2022-02-19 NOTE — ED Notes (Signed)
[name] (sister) would like to be called with a status update on pt. Phone is [phone]

## 2022-02-19 NOTE — ED Notes (Signed)
Patient placed in chair with this NT and RN. Purwick back in place with brief. Call bell in reach.

## 2022-02-19 NOTE — Progress Notes (Addendum)
PROGRESS NOTE        PATIENT DETAILS Name: Chelsea [name] Age: 71 y.o. Sex: female Date of Birth: 05/07/1950 Admit Date: 02/18/2022 Admitting Physician Evalee Mutton Kristeen Mans, MD PYP:[id number], Malvin Johns, NP  Brief Summary: Patient is a 71 y.o.  female with history of COPD, OSA on CPAP/BiPAP, DM-2, HTN, bipolar disorder-who presented with 2-3-day history of worsening shortness of breath/cough-she was found to have acute hypoxic respiratory failure due to COPD exacerbation in the setting of acute COVID-19 infection (Per H&P-sister/brother-in-law with URI)  Significant events: 11/21>> to ED via EMS due to dyspnea-required BiPAP and route.  Admit to [facility].  COVID-19 positive.  Significant studies: 11/21>> CXR: No PNA 11/22>> CXR: No PNA  Significant microbiology data: 11/21>> COVID PCR: Positive 11/21>> influenza A/B: Negative  Procedures: None  Consults: None  Subjective: Feels much better-liberated off BiPAP overnight.  On just 1 L of oxygen with O2 saturation in the 90s.  Objective: Vitals: Blood pressure (!) 145/67, pulse 78, temperature 98 F (36.7 C), temperature source Oral, resp. rate 16, height '5\' 2"'$  (1.575 m), weight 89.1 kg, SpO2 100 %.   Exam: Gen Exam:Alert awake-not in any distress HEENT:atraumatic, normocephalic Chest: B/L clear to auscultation anteriorly CVS:S1S2 regular Abdomen:soft non tender, non distended Extremities:no edema Neurology: Non focal Skin: no rash  Pertinent Labs/Radiology:    Latest Ref Rng & Units 02/19/2022    1:45 AM 02/18/2022    4:30 PM 03/23/2021    4:28 AM  CBC  WBC 4.0 - 10.5 K/uL 3.5  4.6  9.7   Hemoglobin 12.0 - 15.0 g/dL 7.9  8.2  7.9   Hematocrit 36.0 - 46.0 % 25.5  27.5  25.9   Platelets 150 - 400 K/uL 228  265  486     Lab Results  Component Value Date   NA 136 02/19/2022   K 3.9 02/19/2022   CL 107 02/19/2022   CO2 20 (L) 02/19/2022      Assessment/Plan: Acute hypoxic respiratory  failure due to COPD exacerbation from COVID-19 infection Markedly better-liberated off BiPAP overnight. On just 1 L of oxygen Taper steroids-continue bronchodilators Continue molnupiravir Attempt to titrate off oxygen  Chronic HFpEF Euvolemic Continue Bumex  CKD stage IIIa At baseline  DM-2 with uncontrolled hyperglycemia due to steroids Increase Semglee to 15 units, add 4 units of NovoLog with meals Continue SSI Follow CBGs-and optimize regimen accordingly  Recent Labs    02/18/22 2141 02/19/22 0802  GLUCAP 177* 333*    HTN BP stable Continue Amlodipine Resume Metoprolol when able (?junctional rhythm when first presented-repeat EKG 11/23)  Hypothyroidism Continue Synthroid  Chronic normocytic anemia Ongoing for the past several years Likely due to underlying CKD No evidence of blood loss  Mild leukopenia Likely due to COVID-19 infection Repeat CBC in PCPs office in 1 week  Hypomagnesemia Replete/recheck  Schizophrenia chronic paranoid type General anxiety disorder Followed by psychiatry in the outpatient setting Continue trazodone, Paxil, Geodon and Klonopin  OSA BiPAP nightly  Obesity: Estimated body mass index is 35.93 kg/m as calculated from the following:   Height as of this encounter: '5\' 2"'$  (1.575 m).   Weight as of this encounter: 89.1 kg.   Code status:   Code Status: DNR (Sister Chelsea [name] confirms DNR status)  DVT Prophylaxis: heparin injection 5,000 Units Start: 02/18/22 2200 SCDs Start: 02/18/22 2050   Family Communication:  Sister-Chelsea [name]-updated over the phone 11/22   Disposition Plan: Status is: Observation The patient will require care spanning > 2 midnights and should be moved to inpatient because: Acute hypoxia/COPD exacerbation/COVID-19 infection   Planned Discharge Destination:Home likely 11/23 if clinical improvement continues   Diet: Diet Order             Diet clear liquid Room service appropriate? Yes;  Fluid consistency: Thin  Diet effective now                     Antimicrobial agents: Anti-infectives (From admission, onward)    Start     Dose/Rate Route Frequency Ordered Stop   02/18/22 2200  molnupiravir EUA (LAGEVRIO) capsule 800 mg        4 capsule Oral 2 times daily 02/18/22 2006 02/23/22 2159        MEDICATIONS: Scheduled Meds:  amLODipine  5 mg Oral Daily   bumetanide  1 mg Oral Daily   heparin  5,000 Units Subcutaneous Q8H   insulin aspart  0-15 Units Subcutaneous TID WC   insulin aspart  0-5 Units Subcutaneous QHS   insulin glargine-yfgn  10 Units Subcutaneous Daily   ipratropium-albuterol  3 mL Nebulization Q6H   levothyroxine  125 mcg Oral QAC breakfast   methylPREDNISolone (SOLU-MEDROL) injection  100 mg Intravenous Daily   molnupiravir EUA  4 capsule Oral BID   pantoprazole  40 mg Oral Daily   Continuous Infusions: PRN Meds:.acetaminophen **OR** acetaminophen, melatonin, ondansetron **OR** ondansetron (ZOFRAN) IV   I have personally reviewed following labs and imaging studies  LABORATORY DATA: CBC: Recent Labs  Lab 02/18/22 1630 02/19/22 0145  WBC 4.6 3.5*  NEUTROABS 2.5 2.9  HGB 8.2* 7.9*  HCT 27.5* 25.5*  MCV 81.6 80.7  PLT 265 295    Basic Metabolic Panel: Recent Labs  Lab 02/18/22 1630 02/19/22 0145  NA 139 136  K 4.4 3.9  CL 104 107  CO2 21* 20*  GLUCOSE 230* 320*  BUN 20 21  CREATININE 1.55* 1.48*  CALCIUM 8.9 7.9*  MG  --  1.5*    GFR: Estimated Creatinine Clearance: 37.2 mL/min (A) (by C-G formula based on SCr of 1.48 mg/dL (H)).  Liver Function Tests: Recent Labs  Lab 02/18/22 1630 02/19/22 0145  AST 21 18  ALT 12 13  ALKPHOS 78 72  BILITOT 0.5 0.5  PROT 6.2* 5.5*  ALBUMIN 2.8* 2.5*   No results for input(s): "LIPASE", "AMYLASE" in the last 168 hours. No results for input(s): "AMMONIA" in the last 168 hours.  Coagulation Profile: No results for input(s): "INR", "PROTIME" in the last 168  hours.  Cardiac Enzymes: No results for input(s): "CKTOTAL", "CKMB", "CKMBINDEX", "TROPONINI" in the last 168 hours.  BNP (last 3 results) No results for input(s): "PROBNP" in the last 8760 hours.  Lipid Profile: No results for input(s): "CHOL", "HDL", "LDLCALC", "TRIG", "CHOLHDL", "LDLDIRECT" in the last 72 hours.  Thyroid Function Tests: No results for input(s): "TSH", "T4TOTAL", "FREET4", "T3FREE", "THYROIDAB" in the last 72 hours.  Anemia Panel: No results for input(s): "VITAMINB12", "FOLATE", "FERRITIN", "TIBC", "IRON", "RETICCTPCT" in the last 72 hours.  Urine analysis:    Component Value Date/Time   COLORURINE YELLOW 09/27/2020 1846   APPEARANCEUR CLEAR 09/27/2020 1846   LABSPEC 1.005 09/27/2020 1846   PHURINE 6.0 09/27/2020 1846   GLUCOSEU NEGATIVE 09/27/2020 1846   HGBUR NEGATIVE 09/27/2020 1846   BILIRUBINUR NEGATIVE 09/27/2020 1846   KETONESUR NEGATIVE 09/27/2020 1846   PROTEINUR NEGATIVE  09/27/2020 1846   UROBILINOGEN 1.0 12/03/2012 1531   NITRITE NEGATIVE 09/27/2020 1846   LEUKOCYTESUR LARGE (A) 09/27/2020 1846    Sepsis Labs: Lactic Acid, Venous    Component Value Date/Time   LATICACIDVEN 0.6 03/15/2021 0515    MICROBIOLOGY: Recent Results (from the past 240 hour(s))  Resp Panel by RT-PCR (Flu A&B, Covid) Anterior Nasal Swab     Status: Abnormal   Collection Time: 02/18/22  4:23 PM   Specimen: Anterior Nasal Swab  Result Value Ref Range Status   SARS Coronavirus 2 by RT PCR POSITIVE (A) NEGATIVE Final    Comment: (NOTE) SARS-CoV-2 target nucleic acids are DETECTED.  The SARS-CoV-2 RNA is generally detectable in upper respiratory specimens during the acute phase of infection. Positive results are indicative of the presence of the identified virus, but do not rule out bacterial infection or co-infection with other pathogens not detected by the test. Clinical correlation with patient history and other diagnostic information is necessary to determine  patient infection status. The expected result is Negative.  Fact Sheet for Patients: EntrepreneurPulse.com.au  Fact Sheet for Healthcare Providers: IncredibleEmployment.be  This test is not yet approved or cleared by the [place] FDA and  has been authorized for detection and/or diagnosis of SARS-CoV-2 by FDA under an Emergency Use Authorization (EUA).  This EUA will remain in effect (meaning this test can be used) for the duration of  the COVID-19 declaration under Section 564(b)(1) of the A ct, 21 U.S.C. section 360bbb-3(b)(1), unless the authorization is terminated or revoked sooner.     Influenza A by PCR NEGATIVE NEGATIVE Final   Influenza B by PCR NEGATIVE NEGATIVE Final    Comment: (NOTE) The Xpert Xpress SARS-CoV-2/FLU/RSV plus assay is intended as an aid in the diagnosis of influenza from Nasopharyngeal swab specimens and should not be used as a sole basis for treatment. Nasal washings and aspirates are unacceptable for Xpert Xpress SARS-CoV-2/FLU/RSV testing.  Fact Sheet for Patients: EntrepreneurPulse.com.au  Fact Sheet for Healthcare Providers: IncredibleEmployment.be  This test is not yet approved or cleared by the [place] FDA and has been authorized for detection and/or diagnosis of SARS-CoV-2 by FDA under an Emergency Use Authorization (EUA). This EUA will remain in effect (meaning this test can be used) for the duration of the COVID-19 declaration under Section 564(b)(1) of the Act, 21 U.S.C. section 360bbb-3(b)(1), unless the authorization is terminated or revoked.  Performed at [facility], [place] [address] [place], [place] [address]     RADIOLOGY STUDIES/RESULTS: DG Chest Port 1V same Day  Result Date: 02/19/2022 CLINICAL DATA:  Shortness of breath EXAM: PORTABLE CHEST 1 VIEW COMPARISON:  CXR 02/18/22 FINDINGS: No pleural effusion. No pneumothorax. Unchanged  cardiac and mediastinal contours. No focal airspace opacity. No displaced rib fractures. Visualized upper abdomen is unremarkable. Mild aortic atherosclerotic calcifications. IMPRESSION: No radiographic finding to explain shortness of breath Electronically Signed   By: Marin Roberts M.D.   On: 02/19/2022 07:53   DG Chest Portable 1 View  Result Date: 02/18/2022 CLINICAL DATA:  Shortness of breath EXAM: PORTABLE CHEST 1 VIEW COMPARISON:  Chest x-ray 03/19/2021 FINDINGS: The heart size and mediastinal contours are within normal limits. Both lungs are clear. The visualized skeletal structures are unremarkable. IMPRESSION: No active disease. Electronically Signed   By: Ronney Asters M.D.   On: 02/18/2022 17:07     LOS: 0 days   Oren Binet, MD  [facility]    To contact the attending provider between  7A-7P or the covering provider during after hours 7P-7A, please log into the web site [email or web address] and access using universal [place] password for that web site. If you do not have the password, please call the hospital operator.  02/19/2022, 9:16 AM

## 2022-02-20 DIAGNOSIS — J441 Chronic obstructive pulmonary disease with (acute) exacerbation: Secondary | ICD-10-CM

## 2022-02-20 LAB — BASIC METABOLIC PANEL
Anion gap: 13 (ref 5–15)
BUN: 26 mg/dL — ABNORMAL HIGH (ref 8–23)
CO2: 21 mmol/L — ABNORMAL LOW (ref 22–32)
Calcium: 8.9 mg/dL (ref 8.9–10.3)
Chloride: 101 mmol/L (ref 98–111)
Creatinine, Ser: 1.68 mg/dL — ABNORMAL HIGH (ref 0.44–1.00)
GFR, Estimated: 33 mL/min — ABNORMAL LOW (ref >60)
Glucose, Bld: 254 mg/dL — ABNORMAL HIGH (ref 70–99)
Potassium: 3.9 mmol/L (ref 3.5–5.1)
Sodium: 135 mmol/L (ref 135–145)

## 2022-02-20 LAB — MAGNESIUM: Magnesium: 2.1 mg/dL (ref 1.7–2.4)

## 2022-02-20 LAB — GLUCOSE, CAPILLARY: Glucose-Capillary: 222 mg/dL — ABNORMAL HIGH (ref 70–99)

## 2022-02-20 MED ORDER — ALBUTEROL SULFATE (2.5 MG/3ML) 0.083% IN NEBU: 2.5000 mg | INHALATION_SOLUTION | Freq: Four times a day (QID) | RESPIRATORY_TRACT | 12 refills | Status: AC | PRN

## 2022-02-20 MED ORDER — PREDNISONE 10 MG PO TABS: ORAL_TABLET | ORAL | 0 refills | Status: DC

## 2022-02-20 MED ORDER — MOLNUPIRAVIR EUA 200MG CAPSULE: 4.0000 | ORAL_CAPSULE | Freq: Two times a day (BID) | ORAL | 0 refills | Status: AC

## 2022-02-20 NOTE — Discharge Summary (Signed)
PATIENT DETAILS Name: Chelsea [name] Age: 71 y.o. Sex: female Date of Birth: [birth year] MRN: [id number]. Admitting Physician: Jonetta Osgood, MD [id number], Malvin Johns, NP  Admit Date: 02/18/2022 Discharge date: 02/20/2022  Recommendations for Outpatient Follow-up:  Follow up with PCP in 1-2 weeks Please obtain CMP/CBC in one week  Admitted From:  Home  Disposition: Home with home health PT/OT   Discharge Condition: fair  CODE STATUS:   Code Status: DNR   Diet recommendation:  Diet Order             Diet - low sodium heart healthy           Diet Carb Modified           Diet heart healthy/carb modified Room service appropriate? Yes; Fluid consistency: Thin  Diet effective now                    Brief Summary: Patient is a 71 y.o.  female with history of COPD, OSA on CPAP/BiPAP, DM-2, HTN, bipolar disorder-who presented with 2-3-day history of worsening shortness of breath/cough-she was found to have acute hypoxic respiratory failure due to COPD exacerbation in the setting of acute COVID-19 infection (Per H&P-sister/brother-in-law with URI)   Significant events: 11/21>> to ED via EMS due to dyspnea-required BiPAP and route.  Admit to [facility].  COVID-19 positive.   Significant studies: 11/21>> CXR: No PNA 11/22>> CXR: No PNA   Significant microbiology data: 11/21>> COVID PCR: Positive 11/21>> influenza A/B: Negative   Procedures: None   Consults: None  Brief Hospital Course: Acute hypoxic respiratory failure due to COPD exacerbation from COVID-19 infection Initially required BiPAP when she first presented  Significant improvement with steroids/bronchodilators  Titrated to room air this morning  Continue tapering steroids and usual bronchodilator regimen Continue molnupiravir  Per patient-she has home O2 that she uses as needed  I have encouraged her to use home O2 for the next few days Ambulated in the room independently-and went to the bathroom  (RN/MD present)   Chronic HFpEF Euvolemic Continue Bumex   CKD stage IIIa At baseline   DM-2 with uncontrolled hyperglycemia due to steroids CBGs on the higher side due to steroids Anticipate improvement as steroid dosage gets tapered down. Continue usual dosing of Levemir/NovoLog/metformin and Ozempic.  HTN BP stable Continue metoprolol and amlodipine-as she is back in sinus rhythm.     Hypothyroidism Continue Synthroid   Chronic normocytic anemia Ongoing for the past several years Likely due to underlying CKD No evidence of blood loss   Mild leukopenia Likely due to COVID-19 infection Repeat CBC in PCPs office in 1 week   Hypomagnesemia Repleted.   Schizophrenia chronic paranoid type General anxiety disorder Followed by psychiatry in the outpatient setting Continue trazodone, Paxil, Geodon and Klonopin   OSA BiPAP nightly She also has home O2 as needed.   Obesity: Estimated body mass index is 35.89 kg/m as calculated from the following:   Height as of this encounter: '5\' 2"'$  (1.575 m).   Weight as of this encounter: 89 kg.    Discharge Diagnoses:  Principal Problem:   Acute respiratory failure with hypoxia ([place]) Active Problems:   COVID-19 virus infection   Essential hypertension   Type 2 diabetes mellitus without complication (HCC)   Morbid obesity (HCC)   COPD (chronic obstructive pulmonary disease) (HCC)   OSA (obstructive sleep apnea)   Stage 3a chronic kidney disease (CKD) ([place])- Baseline creatinine 1.3-1.5   Chronic diastolic CHF (congestive heart  failure) ([place])   DNR (do not resuscitate)/DNI(Do Not Intubate)   COVID-19   Discharge Instructions:  Activity:  As tolerated   Discharge Instructions     Call MD for:  difficulty breathing, headache or visual disturbances   Complete by: As directed    Diet - low sodium heart healthy   Complete by: As directed    Diet Carb Modified   Complete by: As directed    Increase activity slowly    Complete by: As directed       Allergies as of 02/20/2022       Reactions   Darvocet [propoxyphene N-acetaminophen] Anaphylaxis   Can take plain tylenol   Fluoxetine Other (See Comments)   Hallucinations    Penicillins Rash   Promethazine Hives        Medication List     TAKE these medications    acetaminophen 325 MG tablet Commonly known as: TYLENOL Take 650 mg by mouth every 6 (six) hours as needed for moderate pain.   amLODipine 2.5 MG tablet Commonly known as: NORVASC Take 2.5 mg by mouth daily.   bumetanide 1 MG tablet Commonly known as: BUMEX Take 1 tablet (1 mg total) by mouth daily.   chlorpheniramine-HYDROcodone 10-8 MG/5ML Suer Commonly known as: TUSSIONEX Take 5 mLs by mouth every 12 (twelve) hours as needed for cough. cough   clonazePAM 1 MG tablet Commonly known as: KLONOPIN Take 1 tablet (1 mg total) by mouth at bedtime.   diclofenac sodium 1 % Gel Commonly known as: VOLTAREN Apply 2 g topically 4 (four) times daily as needed (pain).   Ensure Max Protein Liqd Take 330 mLs (11 oz total) by mouth 2 (two) times daily.   ferrous gluconate 324 MG tablet Commonly known as: FERGON Take 324 mg by mouth daily with breakfast.   fluticasone 50 MCG/ACT nasal spray Commonly known as: FLONASE Place 2 sprays into both nostrils daily.   fluticasone furoate-vilanterol 200-25 MCG/INH Aepb Commonly known as: BREO ELLIPTA Inhale 1 puff into the lungs every morning.   Levemir 100 UNIT/ML injection Generic drug: insulin detemir Inject 30 Units into the skin at bedtime.   levothyroxine 125 MCG tablet Commonly known as: SYNTHROID Take 125 mcg by mouth daily before breakfast.   metFORMIN 500 MG tablet Commonly known as: GLUCOPHAGE Take 500 mg by mouth in the morning and at bedtime.   metoprolol succinate 100 MG 24 hr tablet Commonly known as: TOPROL-XL Take 100 mg by mouth in the morning. Take with or immediately following a meal.   metoprolol  tartrate 25 MG tablet Commonly known as: LOPRESSOR Take 0.5 tablets (12.5 mg total) by mouth 2 (two) times daily.   molnupiravir EUA 200 mg Caps capsule Commonly known as: LAGEVRIO Take 4 capsules (800 mg total) by mouth 2 (two) times daily for 5 days.   montelukast 10 MG tablet Commonly known as: SINGULAIR Take 10 mg by mouth daily.   multivitamin with minerals Tabs tablet Take 1 tablet by mouth daily.   NovoLOG FlexPen 100 UNIT/ML FlexPen Generic drug: insulin aspart Inject 2-12 Units into the skin 3 (three) times daily with meals. Sliding scale   nystatin ointment Commonly known as: MYCOSTATIN Apply 1 application  topically 2 (two) times daily as needed (for rashes).   omeprazole 40 MG capsule Commonly known as: PRILOSEC Take 40 mg by mouth daily before breakfast.   ondansetron 4 MG tablet Commonly known as: ZOFRAN Take 1 tablet (4 mg total) by mouth every 6 (six)  hours as needed for nausea.   Ozempic (2 MG/DOSE) 8 MG/3ML Sopn Generic drug: Semaglutide (2 MG/DOSE) Inject 2 mg into the skin every Friday.   PARoxetine 40 MG tablet Commonly known as: PAXIL Take 1 tablet (40 mg total) by mouth every morning.   polyethylene glycol 17 g packet Commonly known as: MIRALAX / GLYCOLAX Take 17 g by mouth daily as needed for mild constipation.   predniSONE 10 MG tablet Commonly known as: DELTASONE Take 40 mg daily for 1 day, 30 mg daily for 1 day, 20 mg daily for 1 days,10 mg daily for 1 day, then stop   PRESCRIPTION MEDICATION BiPAP- At bedtime   rOPINIRole 0.25 MG tablet Commonly known as: REQUIP TAKE 3 TABLETS BY MOUTH AT BEDTIME   rosuvastatin 10 MG tablet Commonly known as: CRESTOR Take 10 mg by mouth at bedtime.   traZODone 100 MG tablet Commonly known as: DESYREL Take 1 tablet (100 mg total) by mouth at bedtime.   TRUEplus Insulin Syringe 29G X 1/2" 1 ML Misc Generic drug: INSULIN SYRINGE 1CC/29G 1 each by Other route daily.   Ventolin HFA 108 (90 Base)  MCG/ACT inhaler Generic drug: albuterol Inhale 1-2 puffs into the lungs every 4 (four) hours as needed for wheezing or shortness of breath.   albuterol (2.5 MG/3ML) 0.083% nebulizer solution Commonly known as: PROVENTIL Take 3 mLs (2.5 mg total) by nebulization every 6 (six) hours as needed for wheezing or shortness of breath.   Vitamin D3 125 MCG (5000 UT) Tabs Take 5,000 Units by mouth daily.   ziprasidone 80 MG capsule Commonly known as: GEODON Take 1 capsule (80 mg total) by mouth at bedtime.        Follow-up Information     Darrol Jump, NP. Schedule an appointment as soon as possible for a visit in 1 week(s).   Specialty: Nurse Practitioner Contact information: [address] [place] [address] [phone]                Allergies  Allergen Reactions   Darvocet [Propoxyphene N-Acetaminophen] Anaphylaxis    Can take plain tylenol   Fluoxetine Other (See Comments)    Hallucinations    Penicillins Rash   Promethazine Hives     Other Procedures/Studies: DG Chest Port 1V same Day  Result Date: 02/19/2022 CLINICAL DATA:  Shortness of breath EXAM: PORTABLE CHEST 1 VIEW COMPARISON:  CXR 02/18/22 FINDINGS: No pleural effusion. No pneumothorax. Unchanged cardiac and mediastinal contours. No focal airspace opacity. No displaced rib fractures. Visualized upper abdomen is unremarkable. Mild aortic atherosclerotic calcifications. IMPRESSION: No radiographic finding to explain shortness of breath Electronically Signed   By: Marin Roberts M.D.   On: 02/19/2022 07:53   DG Chest Portable 1 View  Result Date: 02/18/2022 CLINICAL DATA:  Shortness of breath EXAM: PORTABLE CHEST 1 VIEW COMPARISON:  Chest x-ray 03/19/2021 FINDINGS: The heart size and mediastinal contours are within normal limits. Both lungs are clear. The visualized skeletal structures are unremarkable. IMPRESSION: No active disease. Electronically Signed   By: Ronney Asters M.D.   On: 02/18/2022  17:07     TODAY-DAY OF DISCHARGE:  Subjective:   Norm Parcel today has no headache,no chest abdominal pain,no new weakness tingling or numbness, feels much better wants to go home today.   Objective:   Blood pressure (!) 147/109, pulse 76, temperature 97.7 F (36.5 C), temperature source Oral, resp. rate 18, height '5\' 2"'$  (1.575 m), weight 89 kg, SpO2 95 %. No intake or  output data in the 24 hours ending 02/20/22 0929 Filed Weights   02/18/22 1616 02/20/22 0500  Weight: 89.1 kg 89 kg    Exam: Awake Alert, Oriented *3, No new F.N deficits, Normal affect [place].AT,PERRAL Supple Neck,No JVD, No cervical lymphadenopathy appriciated.  Symmetrical Chest wall movement, Good air movement bilaterally, CTAB RRR,No Gallops,Rubs or new Murmurs, No Parasternal Heave +ve B.Sounds, Abd Soft, Non tender, No organomegaly appriciated, No rebound -guarding or rigidity. No Cyanosis, Clubbing or edema, No new Rash or bruise   PERTINENT RADIOLOGIC STUDIES: DG Chest Port 1V same Day  Result Date: 02/19/2022 CLINICAL DATA:  Shortness of breath EXAM: PORTABLE CHEST 1 VIEW COMPARISON:  CXR 02/18/22 FINDINGS: No pleural effusion. No pneumothorax. Unchanged cardiac and mediastinal contours. No focal airspace opacity. No displaced rib fractures. Visualized upper abdomen is unremarkable. Mild aortic atherosclerotic calcifications. IMPRESSION: No radiographic finding to explain shortness of breath Electronically Signed   By: Marin Roberts M.D.   On: 02/19/2022 07:53   DG Chest Portable 1 View  Result Date: 02/18/2022 CLINICAL DATA:  Shortness of breath EXAM: PORTABLE CHEST 1 VIEW COMPARISON:  Chest x-ray 03/19/2021 FINDINGS: The heart size and mediastinal contours are within normal limits. Both lungs are clear. The visualized skeletal structures are unremarkable. IMPRESSION: No active disease. Electronically Signed   By: Ronney Asters M.D.   On: 02/18/2022 17:07     PERTINENT LAB RESULTS: CBC: Recent Labs     02/18/22 1630 02/19/22 0145  WBC 4.6 3.5*  HGB 8.2* 7.9*  HCT 27.5* 25.5*  PLT 265 228   CMET CMP     Component Value Date/Time   NA 135 02/20/2022 0145   K 3.9 02/20/2022 0145   CL 101 02/20/2022 0145   CO2 21 (L) 02/20/2022 0145   GLUCOSE 254 (H) 02/20/2022 0145   BUN 26 (H) 02/20/2022 0145   CREATININE 1.68 (H) 02/20/2022 0145   CALCIUM 8.9 02/20/2022 0145   PROT 5.5 (L) 02/19/2022 0145   ALBUMIN 2.5 (L) 02/19/2022 0145   AST 18 02/19/2022 0145   ALT 13 02/19/2022 0145   ALKPHOS 72 02/19/2022 0145   BILITOT 0.5 02/19/2022 0145   GFRNONAA 33 (L) 02/20/2022 0145   GFRAA 38 (L) 06/02/2016 2343    GFR Estimated Creatinine Clearance: 32.8 mL/min (A) (by C-G formula based on SCr of 1.68 mg/dL (H)). No results for input(s): "LIPASE", "AMYLASE" in the last 72 hours. No results for input(s): "CKTOTAL", "CKMB", "CKMBINDEX", "TROPONINI" in the last 72 hours. Invalid input(s): "POCBNP" No results for input(s): "DDIMER" in the last 72 hours. Recent Labs    02/18/22 2225  HGBA1C 7.5*   No results for input(s): "CHOL", "HDL", "LDLCALC", "TRIG", "CHOLHDL", "LDLDIRECT" in the last 72 hours. No results for input(s): "TSH", "T4TOTAL", "T3FREE", "THYROIDAB" in the last 72 hours.  Invalid input(s): "FREET3" No results for input(s): "VITAMINB12", "FOLATE", "FERRITIN", "TIBC", "IRON", "RETICCTPCT" in the last 72 hours. Coags: No results for input(s): "INR" in the last 72 hours.  Invalid input(s): "PT" Microbiology: Recent Results (from the past 240 hour(s))  Resp Panel by RT-PCR (Flu A&B, Covid) Anterior Nasal Swab     Status: Abnormal   Collection Time: 02/18/22  4:23 PM   Specimen: Anterior Nasal Swab  Result Value Ref Range Status   SARS Coronavirus 2 by RT PCR POSITIVE (A) NEGATIVE Final    Comment: (NOTE) SARS-CoV-2 target nucleic acids are DETECTED.  The SARS-CoV-2 RNA is generally detectable in upper respiratory specimens during the acute phase of infection. Positive  results are indicative of the presence of the identified virus, but do not rule out bacterial infection or co-infection with other pathogens not detected by the test. Clinical correlation with patient history and other diagnostic information is necessary to determine patient infection status. The expected result is Negative.  Fact Sheet for Patients: EntrepreneurPulse.com.au  Fact Sheet for Healthcare Providers: IncredibleEmployment.be  This test is not yet approved or cleared by the [place] FDA and  has been authorized for detection and/or diagnosis of SARS-CoV-2 by FDA under an Emergency Use Authorization (EUA).  This EUA will remain in effect (meaning this test can be used) for the duration of  the COVID-19 declaration under Section 564(b)(1) of the A ct, 21 U.S.C. section 360bbb-3(b)(1), unless the authorization is terminated or revoked sooner.     Influenza A by PCR NEGATIVE NEGATIVE Final   Influenza B by PCR NEGATIVE NEGATIVE Final    Comment: (NOTE) The Xpert Xpress SARS-CoV-2/FLU/RSV plus assay is intended as an aid in the diagnosis of influenza from Nasopharyngeal swab specimens and should not be used as a sole basis for treatment. Nasal washings and aspirates are unacceptable for Xpert Xpress SARS-CoV-2/FLU/RSV testing.  Fact Sheet for Patients: EntrepreneurPulse.com.au  Fact Sheet for Healthcare Providers: IncredibleEmployment.be  This test is not yet approved or cleared by the [place] FDA and has been authorized for detection and/or diagnosis of SARS-CoV-2 by FDA under an Emergency Use Authorization (EUA). This EUA will remain in effect (meaning this test can be used) for the duration of the COVID-19 declaration under Section 564(b)(1) of the Act, 21 U.S.C. section 360bbb-3(b)(1), unless the authorization is terminated or revoked.  Performed at [facility], [place]  [address] [place], [place] [address]     FURTHER DISCHARGE INSTRUCTIONS:  Get Medicines reviewed and adjusted: Please take all your medications with you for your next visit with your Primary MD  Laboratory/radiological data: Please request your Primary MD to go over all hospital tests and procedure/radiological results at the follow up, please ask your Primary MD to get all Hospital records sent to his/her office.  In some cases, they will be blood work, cultures and biopsy results pending at the time of your discharge. Please request that your primary care M.D. goes through all the records of your hospital data and follows up on these results.  Also Note the following: If you experience worsening of your admission symptoms, develop shortness of breath, life threatening emergency, suicidal or homicidal thoughts you must seek medical attention immediately by calling 911 or calling your MD immediately  if symptoms less severe.  You must read complete instructions/literature along with all the possible adverse reactions/side effects for all the Medicines you take and that have been prescribed to you. Take any new Medicines after you have completely understood and accpet all the possible adverse reactions/side effects.   Do not drive when taking Pain medications or sleeping medications (Benzodaizepines)  Do not take more than prescribed Pain, Sleep and Anxiety Medications. It is not advisable to combine anxiety,sleep and pain medications without talking with your primary care practitioner  Special Instructions: If you have smoked or chewed Tobacco  in the last 2 yrs please stop smoking, stop any regular Alcohol  and or any Recreational drug use.  Wear Seat belts while driving.  Please note: You were cared for by a hospitalist during your hospital stay. Once you are discharged, your primary care physician will handle any further medical issues. Please note that NO REFILLS  for any discharge  medications will be authorized once you are discharged, as it is imperative that you return to your primary care physician (or establish a relationship with a primary care physician if you do not have one) for your post hospital discharge needs so that they can reassess your need for medications and monitor your lab values.  Total Time spent coordinating discharge including counseling, education and face to face time equals greater than 30 minutes.  SignedOren Binet 02/20/2022 9:29 AM

## 2022-02-20 NOTE — Progress Notes (Signed)
RT NOTE:  Pt does not wear CPAP @ home. She only wear 2L [place].

## 2022-02-27 ENCOUNTER — Observation Stay (HOSPITAL_COMMUNITY)
Admission: EM | Admit: 2022-02-27 | Discharge: 2022-03-01 | Disposition: A | Discharge status: Home / Self Care | Payer: Medicare Other | Attending: Family Medicine | Admitting: Family Medicine

## 2022-02-27 DIAGNOSIS — E119 Type 2 diabetes mellitus without complications: Secondary | ICD-10-CM

## 2022-02-27 DIAGNOSIS — I10 Essential (primary) hypertension: Secondary | ICD-10-CM | POA: Diagnosis present

## 2022-02-27 DIAGNOSIS — G40009 Localization-related (focal) (partial) idiopathic epilepsy and epileptic syndromes with seizures of localized onset, not intractable, without status epilepticus: Secondary | ICD-10-CM

## 2022-02-27 DIAGNOSIS — Y92002 Bathroom of unspecified non-institutional (private) residence single-family (private) house as the place of occurrence of the external cause: Secondary | ICD-10-CM | POA: Diagnosis not present

## 2022-02-27 DIAGNOSIS — Z794 Long term (current) use of insulin: Secondary | ICD-10-CM | POA: Insufficient documentation

## 2022-02-27 DIAGNOSIS — I130 Hypertensive heart and chronic kidney disease with heart failure and stage 1 through stage 4 chronic kidney disease, or unspecified chronic kidney disease: Secondary | ICD-10-CM | POA: Diagnosis not present

## 2022-02-27 DIAGNOSIS — E872 Acidosis, unspecified: Secondary | ICD-10-CM | POA: Insufficient documentation

## 2022-02-27 DIAGNOSIS — N1831 Chronic kidney disease, stage 3a: Secondary | ICD-10-CM | POA: Diagnosis not present

## 2022-02-27 DIAGNOSIS — J449 Chronic obstructive pulmonary disease, unspecified: Secondary | ICD-10-CM | POA: Insufficient documentation

## 2022-02-27 DIAGNOSIS — Z87891 Personal history of nicotine dependence: Secondary | ICD-10-CM | POA: Diagnosis not present

## 2022-02-27 DIAGNOSIS — E039 Hypothyroidism, unspecified: Secondary | ICD-10-CM | POA: Diagnosis not present

## 2022-02-27 DIAGNOSIS — Z7984 Long term (current) use of oral hypoglycemic drugs: Secondary | ICD-10-CM | POA: Diagnosis not present

## 2022-02-27 DIAGNOSIS — Z7951 Long term (current) use of inhaled steroids: Secondary | ICD-10-CM | POA: Insufficient documentation

## 2022-02-27 DIAGNOSIS — I5032 Chronic diastolic (congestive) heart failure: Secondary | ICD-10-CM | POA: Insufficient documentation

## 2022-02-27 DIAGNOSIS — R4182 Altered mental status, unspecified: Secondary | ICD-10-CM | POA: Insufficient documentation

## 2022-02-27 DIAGNOSIS — Z79899 Other long term (current) drug therapy: Secondary | ICD-10-CM | POA: Diagnosis not present

## 2022-02-27 DIAGNOSIS — W19XXXA Unspecified fall, initial encounter: Secondary | ICD-10-CM | POA: Insufficient documentation

## 2022-02-27 DIAGNOSIS — G9341 Metabolic encephalopathy: Principal | ICD-10-CM | POA: Insufficient documentation

## 2022-02-27 DIAGNOSIS — G4733 Obstructive sleep apnea (adult) (pediatric): Secondary | ICD-10-CM | POA: Diagnosis present

## 2022-02-27 DIAGNOSIS — R531 Weakness: Secondary | ICD-10-CM | POA: Diagnosis present

## 2022-02-27 DIAGNOSIS — E1122 Type 2 diabetes mellitus with diabetic chronic kidney disease: Secondary | ICD-10-CM | POA: Diagnosis not present

## 2022-02-27 DIAGNOSIS — E785 Hyperlipidemia, unspecified: Secondary | ICD-10-CM | POA: Diagnosis present

## 2022-02-27 NOTE — ED Triage Notes (Signed)
Pt arrives to ED via EMS from home. Pt reported to have taken unwitnessed falls and reported slurred speech and weakness. Pt normally A/O. Pt LKN 1730. Pt reports frequent dysuria x several days. Pt w/ hx of renal failure. No blood thinners reported. BGL 135

## 2022-02-27 NOTE — ED Triage Notes (Incomplete)
Pt arrives to ED via EMS from home. Pt reported to have taken unwitnessed falls and reported slurred speech and weakness. Pt normally A/O. Pt LKN 1730. Pt reports frequent dysuria x several days. Pt w/ hx of renal failure. No blood thinners reported

## 2022-02-28 ENCOUNTER — Emergency Department (HOSPITAL_COMMUNITY): Payer: Medicare Other

## 2022-02-28 ENCOUNTER — Observation Stay (HOSPITAL_COMMUNITY): Payer: Medicare Other

## 2022-02-28 DIAGNOSIS — G9341 Metabolic encephalopathy: Secondary | ICD-10-CM

## 2022-02-28 DIAGNOSIS — R4182 Altered mental status, unspecified: Secondary | ICD-10-CM | POA: Diagnosis present

## 2022-02-28 DIAGNOSIS — R569 Unspecified convulsions: Secondary | ICD-10-CM | POA: Diagnosis not present

## 2022-02-28 DIAGNOSIS — I5032 Chronic diastolic (congestive) heart failure: Secondary | ICD-10-CM

## 2022-02-28 LAB — RESPIRATORY PANEL BY PCR

## 2022-02-28 LAB — COMPREHENSIVE METABOLIC PANEL
ALT: 20 U/L (ref 0–44)
AST: 29 U/L (ref 15–41)
Albumin: 2.6 g/dL — ABNORMAL LOW (ref 3.5–5.0)
Alkaline Phosphatase: 84 U/L (ref 38–126)
Anion gap: 10 (ref 5–15)
BUN: 33 mg/dL — ABNORMAL HIGH (ref 8–23)
CO2: 19 mmol/L — ABNORMAL LOW (ref 22–32)
Calcium: 9 mg/dL (ref 8.9–10.3)
Chloride: 107 mmol/L (ref 98–111)
Creatinine, Ser: 1.62 mg/dL — ABNORMAL HIGH (ref 0.44–1.00)
GFR, Estimated: 34 mL/min — ABNORMAL LOW (ref >60)
Glucose, Bld: 87 mg/dL (ref 70–99)
Potassium: 4 mmol/L (ref 3.5–5.1)
Sodium: 136 mmol/L (ref 135–145)
Total Bilirubin: 0.6 mg/dL (ref 0.3–1.2)
Total Protein: 5.3 g/dL — ABNORMAL LOW (ref 6.5–8.1)

## 2022-02-28 LAB — CBG MONITORING, ED
Glucose-Capillary: 72 mg/dL (ref 70–99)
Glucose-Capillary: 74 mg/dL (ref 70–99)
Glucose-Capillary: 180 mg/dL — ABNORMAL HIGH (ref 70–99)

## 2022-02-28 LAB — CBC WITH DIFFERENTIAL/PLATELET
Abs Immature Granulocytes: 0.07 10*3/uL (ref 0.00–0.07)
Basophils Absolute: 0 10*3/uL (ref 0.0–0.1)
Basophils Relative: 0 %
Eosinophils Absolute: 0.1 10*3/uL (ref 0.0–0.5)
Eosinophils Relative: 1 %
HCT: 28.4 % — ABNORMAL LOW (ref 36.0–46.0)
Hemoglobin: 8.7 g/dL — ABNORMAL LOW (ref 12.0–15.0)
Immature Granulocytes: 1 %
Lymphocytes Relative: 21 %
Lymphs Abs: 1.8 10*3/uL (ref 0.7–4.0)
MCH: 24.9 pg — ABNORMAL LOW (ref 26.0–34.0)
MCHC: 30.6 g/dL (ref 30.0–36.0)
MCV: 81.1 fL (ref 80.0–100.0)
Monocytes Absolute: 1 10*3/uL (ref 0.1–1.0)
Monocytes Relative: 12 %
Neutro Abs: 5.5 10*3/uL (ref 1.7–7.7)
Neutrophils Relative %: 65 %
Platelets: 257 10*3/uL (ref 150–400)
RBC: 3.5 MIL/uL — ABNORMAL LOW (ref 3.87–5.11)
RDW: 13.7 % (ref 11.5–15.5)
WBC: 8.5 10*3/uL (ref 4.0–10.5)
nRBC: 0 % (ref 0.0–0.2)

## 2022-02-28 LAB — URINALYSIS, ROUTINE W REFLEX MICROSCOPIC
Bilirubin Urine: NEGATIVE
Glucose, UA: NEGATIVE mg/dL
Hgb urine dipstick: NEGATIVE
Ketones, ur: NEGATIVE mg/dL
Nitrite: NEGATIVE
Protein, ur: 100 mg/dL — AB
Specific Gravity, Urine: 1.011 (ref 1.005–1.030)
pH: 6 (ref 5.0–8.0)

## 2022-02-28 LAB — I-STAT VENOUS BLOOD GAS, ED
Acid-base deficit: 3 mmol/L — ABNORMAL HIGH (ref 0.0–2.0)
Bicarbonate: 18.1 mmol/L — ABNORMAL LOW (ref 20.0–28.0)
Calcium, Ion: 1.19 mmol/L (ref 1.15–1.40)
HCT: 28 % — ABNORMAL LOW (ref 36.0–46.0)
Hemoglobin: 9.5 g/dL — ABNORMAL LOW (ref 12.0–15.0)
O2 Saturation: 91 %
Potassium: 4 mmol/L (ref 3.5–5.1)
Sodium: 135 mmol/L (ref 135–145)
TCO2: 19 mmol/L — ABNORMAL LOW (ref 22–32)
pCO2, Ven: 21 mmHg — ABNORMAL LOW (ref 44–60)
pH, Ven: 7.544 — ABNORMAL HIGH (ref 7.25–7.43)
pO2, Ven: 51 mmHg — ABNORMAL HIGH (ref 32–45)

## 2022-02-28 LAB — LACTIC ACID, PLASMA
Lactic Acid, Venous: 2.4 mmol/L (ref 0.5–1.9)
Lactic Acid, Venous: 1 mmol/L (ref 0.5–1.9)

## 2022-02-28 LAB — AMMONIA: Ammonia: <10 umol/L (ref 9–35)

## 2022-02-28 LAB — CBC
HCT: 28.4 % — ABNORMAL LOW (ref 36.0–46.0)
Hemoglobin: 9 g/dL — ABNORMAL LOW (ref 12.0–15.0)
MCH: 24.7 pg — ABNORMAL LOW (ref 26.0–34.0)
MCHC: 31.7 g/dL (ref 30.0–36.0)
MCV: 77.8 fL — ABNORMAL LOW (ref 80.0–100.0)
Platelets: 293 10*3/uL (ref 150–400)
RBC: 3.65 MIL/uL — ABNORMAL LOW (ref 3.87–5.11)
RDW: 13.7 % (ref 11.5–15.5)
WBC: 8.2 10*3/uL (ref 4.0–10.5)
nRBC: 0 % (ref 0.0–0.2)

## 2022-02-28 LAB — CREATININE, SERUM
Creatinine, Ser: 1.24 mg/dL — ABNORMAL HIGH (ref 0.44–1.00)
GFR, Estimated: 47 mL/min — ABNORMAL LOW (ref >60)

## 2022-02-28 LAB — APTT: aPTT: 22 seconds — ABNORMAL LOW (ref 24–36)

## 2022-02-28 LAB — TSH: TSH: 0.216 u[IU]/mL — ABNORMAL LOW (ref 0.350–4.500)

## 2022-02-28 LAB — PROTIME-INR
INR: 1 (ref 0.8–1.2)
Prothrombin Time: 13.3 seconds (ref 11.4–15.2)

## 2022-02-28 LAB — VITAMIN B12: Vitamin B-12: 1901 pg/mL — ABNORMAL HIGH (ref 180–914)

## 2022-02-28 LAB — GLUCOSE, CAPILLARY: Glucose-Capillary: 178 mg/dL — ABNORMAL HIGH (ref 70–99)

## 2022-02-28 MED ORDER — HYDROXYZINE HCL 25 MG PO TABS
25.0000 mg | ORAL_TABLET | Freq: Once | ORAL | Status: AC
  Administered 2022-02-28: 25 mg via ORAL
  Filled 2022-02-28: qty 1

## 2022-02-28 MED ORDER — MONTELUKAST SODIUM 10 MG PO TABS
10.0000 mg | ORAL_TABLET | Freq: Every day | ORAL | Status: DC
  Administered 2022-02-28 – 2022-03-01 (×2): 10 mg via ORAL
  Filled 2022-02-28 (×2): qty 1

## 2022-02-28 MED ORDER — ZIPRASIDONE HCL 40 MG PO CAPS
80.0000 mg | ORAL_CAPSULE | Freq: Every day | ORAL | Status: DC
  Administered 2022-02-28: 80 mg via ORAL
  Filled 2022-02-28: qty 1
  Filled 2022-02-28 (×2): qty 2

## 2022-02-28 MED ORDER — INSULIN ASPART 100 UNIT/ML IJ SOLN
0.0000 [IU] | INTRAMUSCULAR | Status: DC
  Administered 2022-02-28 (×2): 2 [IU] via SUBCUTANEOUS
  Administered 2022-03-01: 3 [IU] via SUBCUTANEOUS
  Administered 2022-03-01: 2 [IU] via SUBCUTANEOUS

## 2022-02-28 MED ORDER — ENOXAPARIN SODIUM 40 MG/0.4ML IJ SOSY
40.0000 mg | PREFILLED_SYRINGE | Freq: Every day | INTRAMUSCULAR | Status: DC
  Administered 2022-02-28 – 2022-03-01 (×2): 40 mg via SUBCUTANEOUS
  Filled 2022-02-28 (×2): qty 0.4

## 2022-02-28 MED ORDER — CLONAZEPAM 0.5 MG PO TABS
1.0000 mg | ORAL_TABLET | Freq: Every evening | ORAL | Status: DC | PRN
  Administered 2022-02-28: 1 mg via ORAL
  Filled 2022-02-28: qty 2

## 2022-02-28 MED ORDER — AMLODIPINE BESYLATE 5 MG PO TABS
2.5000 mg | ORAL_TABLET | Freq: Every day | ORAL | Status: DC
  Administered 2022-02-28 – 2022-03-01 (×2): 2.5 mg via ORAL
  Filled 2022-02-28 (×2): qty 1

## 2022-02-28 MED ORDER — LEVOTHYROXINE SODIUM 25 MCG PO TABS
125.0000 ug | ORAL_TABLET | Freq: Every day | ORAL | Status: DC
  Administered 2022-02-28 – 2022-03-01 (×2): 125 ug via ORAL
  Filled 2022-02-28 (×2): qty 1

## 2022-02-28 MED ORDER — SODIUM CHLORIDE 0.9 % IV BOLUS
500.0000 mL | Freq: Once | INTRAVENOUS | Status: AC
  Administered 2022-02-28: 500 mL via INTRAVENOUS

## 2022-02-28 NOTE — Assessment & Plan Note (Signed)
She remains confused and altered  Discussed with epileptologist, given her normal MRI and abnormal slowing on EEG, they suspected postictal state, recommend neurology consultation  There is no evidence of infection to explain her encephalopathy.  Her dehdyration is corrected but confusion persist, suggesting another cause. Check B12, TSH, MMA, RPR Consult neurology

## 2022-02-28 NOTE — Progress Notes (Signed)
  Progress Note   Patient: Chelsea [name] VFI:[id number] DOB: 03/09/1951 DOA: 02/27/2022     0 DOS: the patient was seen and examined on 02/28/2022        Brief hospital course: Mrs. Hucker is a 71 y.o. F with COPD, obesity, OSA on BiPAP, HTN, DM, hypothyroidism, anxiety, dCHF and recent COVID with COPD flare who presented with few days confusion and then fall.    In the ER, the patient remained confused.  MRI brain showed no stroke.     This is a no charge note, for further details, please see the H&P by my partner Dr. [place] Crane from [facility] today.   Assessment and Plan: * AMS (altered mental status) She remains confused and altered  Discussed with epileptologist, given her normal MRI and abnormal slowing on EEG, they suspected postictal state, recommend neurology consultation  There is no evidence of infection to explain her encephalopathy.  Her dehdyration is corrected but confusion persist, suggesting another cause. Check B12, TSH, MMA, RPR Consult neurology  Chronic diastolic CHF (congestive heart failure) (HCC)    Stage 3a chronic kidney disease (CKD) (HCC)- Baseline creatinine 1.3-1.5    HLD (hyperlipidemia)    OSA (obstructive sleep apnea)    COPD (chronic obstructive pulmonary disease) (HCC)    Morbid obesity (HCC) BMI 35.9 in the setting of hypertension, sleep apnea, diabetes  Type 2 diabetes mellitus without complication (HCC)    Essential hypertension            Subjective:      Physical Exam: BP (!) 156/75   Pulse 72   Temp 99.1 F (37.3 C) (Rectal)   Resp 15   Ht '5\' 2"'$  (1.575 m)   Wt 89 kg   SpO2 100%   BMI 35.89 kg/m     Data Reviewed:   Family Communication:     Disposition: Status is: Observation         Author: Edwin Dada, MD 02/28/2022 5:38 PM  For on call review [email or web address].

## 2022-02-28 NOTE — Progress Notes (Signed)
EEG complete - results pending 

## 2022-02-28 NOTE — ED Notes (Signed)
EDP [place] notified of critical lactic.

## 2022-02-28 NOTE — Hospital Course (Addendum)
Chelsea [name] is a 71 y.o. F with COPD, obesity, OSA on BiPAP, HTN, DM, hypothyroidism, anxiety, dCHF and recent COVID with COPD flare who presented with few days confusion and then fall.    In the ER, the patient remained confused.  MRI brain showed no stroke.

## 2022-02-28 NOTE — ED Notes (Signed)
Breakfast tray ordered 

## 2022-02-28 NOTE — Evaluation (Signed)
Physical Therapy Evaluation Patient Details Name: TEMISHA MURLEY MRN: [id number] DOB: [birth year] Today's Date: 02/28/2022  History of Present Illness  Pt is 71 yo female admitted 02/27/22 with AMS and fall.  MRI negative for acute changes.  Pt with acute metabolic encephalopathy unclear etiology. Pt with hx including but not limited to OSA, COPD, HLD, DM2, hypothyroidism, anxiety/depression, and CHF  Clinical Impression  Pt admitted with above diagnosis. At baseline, pt resides with her sister.  Pt reports she is ambulatory with RW and independent with ADLs but sister assist with IADLs.  Does report falls at home.  Today, pt was supervision for bed mobility and min guard transfer and to ambulate 100' with RW.  She required no assistance and had no loss of balance. Pt did fatigue easily and with DOE of 3/4.  She does have some confusion today. Pt mildly below baseline and will benefit from acute PT services.  Pt currently with functional limitations due to the deficits listed below (see PT Problem List). Pt will benefit from skilled PT to increase their independence and safety with mobility to allow discharge to the venue listed below.          Recommendations for follow up therapy are one component of a multi-disciplinary discharge planning process, led by the attending physician.  Recommendations may be updated based on patient status, additional functional criteria and insurance authorization.  Follow Up Recommendations Home health PT      Assistance Recommended at Discharge Frequent or constant Supervision/Assistance  Patient can return home with the following  A little help with walking and/or transfers;Assistance with cooking/housework;A little help with bathing/dressing/bathroom;Help with stairs or ramp for entrance    Equipment Recommendations None recommended by PT  Recommendations for Other Services       Functional Status Assessment Patient has had a recent decline in their  functional status and demonstrates the ability to make significant improvements in function in a reasonable and predictable amount of time.     Precautions / Restrictions Precautions Precautions: Fall      Mobility  Bed Mobility Overal bed mobility: Needs Assistance Bed Mobility: Supine to Sit, Sit to Supine     Supine to sit: Supervision Sit to supine: Supervision        Transfers Overall transfer level: Needs assistance Equipment used: Rolling walker (2 wheels) Transfers: Sit to/from Stand Sit to Stand: Min guard           General transfer comment: min guard for safety; performed from chair and bed; required no assist    Ambulation/Gait Ambulation/Gait assistance: Min guard Gait Distance (Feet): 100 Feet Assistive device: Rolling walker (2 wheels) Gait Pattern/deviations: Step-through pattern       General Gait Details: Ambulated 100' with step through pattern at normal speed; no LOB; min guard for safety; mild fatigue  Stairs            Wheelchair Mobility    Modified Rankin (Stroke Patients Only)       Balance Overall balance assessment: Needs assistance, History of Falls Sitting-balance support: No upper extremity supported Sitting balance-Leahy Scale: Good     Standing balance support: No upper extremity supported, Bilateral upper extremity supported Standing balance-Leahy Scale: Good Standing balance comment: RW to ambulate in hallway but was also able to ambualte around bed without AD                             Pertinent Vitals/Pain  Pain Assessment Pain Assessment: No/denies pain    Home Living Family/patient expects to be discharged to:: Private residence Living Arrangements: Other relatives (sister) Available Help at Discharge: Family;Available 24 hours/day Type of Home: House Home Access: Stairs to enter Entrance Stairs-Rails: Right Entrance Stairs-Number of Steps: 4   Home Layout: One level Home Equipment:  Conservation officer, nature (2 wheels);Cane - single point;Shower seat Additional Comments: Pt questionable historian; however , the information she provided agreed with prior visit    Prior Function               Mobility Comments: Walks with RW; reports could ambulate iin community; does report falls at home but unable to specify how often ADLs Comments: reports indepednent with adls; reports sister does iadls     Hand Dominance        Extremity/Trunk Assessment   Upper Extremity Assessment Upper Extremity Assessment: Overall WFL for tasks assessed    Lower Extremity Assessment Lower Extremity Assessment:  (ROM WFL; MMT 5/5)    Cervical / Trunk Assessment Cervical / Trunk Assessment: Normal  Communication      Cognition Arousal/Alertness: Awake/alert Behavior During Therapy: WFL for tasks assessed/performed Overall Cognitive Status: Impaired/Different from baseline Area of Impairment: Orientation                 Orientation Level: Disoriented to, Time, Situation             General Comments: Oriented to place and self.  Michela Pitcher she had to come to the hospital because she was "sick" and able to state Christmas is coming up but no other date aspects.  She followed commands well and was aware when she was fatiguing and asked to return to room.        General Comments General comments (skin integrity, edema, etc.): vss    Exercises     Assessment/Plan    PT Assessment Patient needs continued PT services  PT Problem List Decreased cognition;Decreased strength;Decreased activity tolerance;Decreased safety awareness;Decreased balance;Decreased mobility;Cardiopulmonary status limiting activity;Decreased knowledge of use of DME       PT Treatment Interventions DME instruction;Gait training;Balance training;Stair training;Cognitive remediation;Functional mobility training;Patient/family education;Therapeutic activities;Therapeutic exercise    PT Goals (Current goals can  be found in the Care Plan section)  Acute Rehab PT Goals Patient Stated Goal: return home PT Goal Formulation: With patient Time For Goal Achievement: 03/14/22 Potential to Achieve Goals: Good    Frequency Min 3X/week     Co-evaluation               AM-PAC PT "6 Clicks" Mobility  Outcome Measure Help needed turning from your back to your side while in a flat bed without using bedrails?: None Help needed moving from lying on your back to sitting on the side of a flat bed without using bedrails?: None Help needed moving to and from a bed to a chair (including a wheelchair)?: A Little Help needed standing up from a chair using your arms (e.g., wheelchair or bedside chair)?: A Little Help needed to walk in hospital room?: A Little Help needed climbing 3-5 steps with a railing? : A Little 6 Click Score: 20    End of Session Equipment Utilized During Treatment: Gait belt Activity Tolerance: Patient tolerated treatment well Patient left: in bed;with call bell/phone within reach Nurse Communication: Mobility status PT Visit Diagnosis: Other abnormalities of gait and mobility (R26.89);History of falling (Z91.81)    Time: 0109-3235 PT Time Calculation (min) (ACUTE ONLY): 20 min  Charges:   PT Evaluation $PT Eval Low Complexity: 1 Low          Kenslie Abbruzzese, PT Acute Rehab [facility] [phone]   Karlton Lemon 02/28/2022, 12:37 PM

## 2022-02-28 NOTE — H&P (Addendum)
History and Physical  Chelsea [name] ZDG:[id number] DOB: 03-30-1951 DOA: 02/27/2022  Referring physician: Dr. Betsey Holiday, [place]  PCP: Darrol Jump, NP  Outpatient Specialists: None Patient coming from: Home.  Chief Complaint: Fall and altered mental status.  HPI: Chelsea [name] is a 71 y.o. female with medical history significant for OSA on BiPAP, COPD, hypertension, hyperlipidemia, type 2 diabetes, hypothyroidism, chronic anxiety/depression, chronic diastolic CHF, who presented to [facility] ED after a fall at home last night.  She fell in her bathroom and could not get up.  She lives with her sister who cares for her.  Per sister at baseline she is alert and oriented x 3.  However for the last 2 days she has been irritable.  Last night she was very confused she was noted to have a slurred speech.  She was brought into the ED for further evaluation.  In the ED, her speech appears to be improved however she is still confused, alert and oriented x 2.  She has a nonfocal exam.  Due to concern for possible stroke versus seizure EDP requested admission for further workup.  The patient was admitted by [facility], hospitalist service.  ED Course: Tmax 98.2.  BP 147/59, pulse 64, respiration rate 15, O2 saturation 94% on room air.  Lab studies remarkable for serum bicarb 19, BUN 33, creatinine 1.62 with baseline creatinine of 1.48.  Hemoglobin 8.7, at baseline.  Review of Systems: Review of systems as noted in the HPI. All other systems reviewed and are negative.   Past Medical History:  Diagnosis Date   Anxiety    Arthritis    Bipolar disorder (HCC)    COPD (chronic obstructive pulmonary disease) (HCC)    Diabetes mellitus type II    Esophageal stricture    moderate distal   GERD (gastroesophageal reflux disease)    Heart murmur    "HAD AN ECHO YEARS AGO- NOTHING TO BE CONCERNED ABOUT"   Hyperlipemia    Hypertension    Hypothyroidism    Mental disorder    Bipolar   Obesity    Oxygen desaturation  during sleep    wears 2 liters at bedtime    Schizophrenia ([place])    patient reports that she is unaware of this   Shortness of breath    with a little activy   Sleep apnea    wears 2 liters of oxygen at bedtime instead of CPAP   Past Surgical History:  Procedure Laterality Date   BALLOON DILATION N/A 06/10/2016   Procedure: BALLOON DILATION;  Surgeon: Wilford Corner, MD;  Location: [place];  Service: Endoscopy;  Laterality: N/A;   BREAST EXCISIONAL BIOPSY Left 05/10/2012   benign   CHOLECYSTECTOMY     COLONOSCOPY WITH PROPOFOL N/A 01/30/2017   Procedure: COLONOSCOPY WITH PROPOFOL;  Surgeon: Wilford Corner, MD;  Location: [place];  Service: Endoscopy;  Laterality: N/A;   COLONOSCOPY WITH PROPOFOL N/A 09/29/2020   Procedure: COLONOSCOPY WITH PROPOFOL;  Surgeon: Clarene Essex, MD;  Location: [facility] ENDOSCOPY;  Service: Endoscopy;  Laterality: N/A;   ESOPHAGEAL MANOMETRY N/A 12/19/2020   Procedure: ESOPHAGEAL MANOMETRY (EM);  Surgeon: Wilford Corner, MD;  Location: [facility] ENDOSCOPY;  Service: Endoscopy;  Laterality: N/A;   ESOPHAGOGASTRODUODENOSCOPY N/A 06/03/2016   Procedure: ESOPHAGOGASTRODUODENOSCOPY (EGD);  Surgeon: Wilford Corner, MD;  Location: [facility] ENDOSCOPY;  Service: Endoscopy;  Laterality: N/A;   ESOPHAGOGASTRODUODENOSCOPY N/A 06/10/2016   Procedure: ESOPHAGOGASTRODUODENOSCOPY (EGD);  Surgeon: Wilford Corner, MD;  Location: [facility] ENDOSCOPY;  Service: Endoscopy;  Laterality: N/A;  ESOPHAGOGASTRODUODENOSCOPY (EGD) WITH PROPOFOL N/A 09/28/2020   Procedure: ESOPHAGOGASTRODUODENOSCOPY (EGD) WITH PROPOFOL;  Surgeon: Arta Silence, MD;  Location: [facility] ENDOSCOPY;  Service: Endoscopy;  Laterality: N/A;   EYE SURGERY     PARTIAL MASTECTOMY WITH NEEDLE LOCALIZATION Left 05/10/2012   Procedure: PARTIAL MASTECTOMY WITH NEEDLE LOCALIZATION;  Surgeon: Adin Hector, MD;  Location: [place];  Service: General;  Laterality: Left;   POLYPECTOMY  09/29/2020   Procedure: POLYPECTOMY;  Surgeon: Clarene Essex, MD;  Location: [facility] ENDOSCOPY;  Service: Endoscopy;;   TONSILLECTOMY      Social History:  reports that she quit smoking about 28 years ago. Her smoking use included cigarettes. She has a 87.00 pack-year smoking history. She has never used smokeless tobacco. She reports that she does not drink alcohol and does not use drugs.   Allergies  Allergen Reactions   Darvocet [Propoxyphene N-Acetaminophen] Anaphylaxis    Can take plain tylenol   Fluoxetine Other (See Comments)    Hallucinations    Penicillins Rash   Promethazine Hives    Family History  Problem Relation Age of Onset   Alzheimer's disease Mother    Alcohol abuse Father    Stroke Father    Sleep apnea Sister    Restless legs syndrome Neg Hx       Prior to Admission medications   Medication Sig Start Date End Date Taking? Authorizing Provider  acetaminophen (TYLENOL) 325 MG tablet Take 650 mg by mouth every 6 (six) hours as needed for moderate pain.    [provider]  albuterol (PROVENTIL) (2.5 MG/3ML) 0.083% nebulizer solution Take 3 mLs (2.5 mg total) by nebulization every 6 (six) hours as needed for wheezing or shortness of breath. 02/20/22   Ghimire, Henreitta Leber, MD  amLODipine (NORVASC) 2.5 MG tablet Take 2.5 mg by mouth daily.    [provider]  bumetanide (BUMEX) 1 MG tablet Take 1 tablet (1 mg total) by mouth daily. 12/04/12   Delfina Redwood, MD  chlorpheniramine-HYDROcodone (TUSSIONEX) 10-8 MG/5ML SUER Take 5 mLs by mouth every 12 (twelve) hours as needed for cough. cough 03/14/21   [provider]  Cholecalciferol (VITAMIN D3) 5000 units TABS Take 5,000 Units by mouth daily.    [provider]  clonazePAM (KLONOPIN) 1 MG tablet Take 1 tablet (1 mg total) by mouth at bedtime. 12/09/21   Arfeen, Arlyce Harman, MD  diclofenac sodium (VOLTAREN) 1 % GEL Apply 2 g topically 4 (four) times daily as needed (pain).    [provider]  Ensure Max Protein (ENSURE MAX PROTEIN) LIQD  Take 330 mLs (11 oz total) by mouth 2 (two) times daily. 03/23/21   Raiford Noble Latif, DO  ferrous gluconate (FERGON) 324 MG tablet Take 324 mg by mouth daily with breakfast.    [provider]  fluticasone (FLONASE) 50 MCG/ACT nasal spray Place 2 sprays into both nostrils daily.    [provider]  fluticasone furoate-vilanterol (BREO ELLIPTA) 200-25 MCG/INH AEPB Inhale 1 puff into the lungs every morning.    [provider]  insulin aspart (NOVOLOG FLEXPEN) 100 UNIT/ML FlexPen Inject 2-12 Units into the skin 3 (three) times daily with meals. Sliding scale    [provider]  LEVEMIR 100 UNIT/ML injection Inject 30 Units into the skin at bedtime. 07/21/18   [provider]  levothyroxine (SYNTHROID) 125 MCG tablet Take 125 mcg by mouth daily before breakfast. 07/21/18   [provider]  metFORMIN (GLUCOPHAGE) 500 MG tablet Take 500 mg  by mouth in the morning and at bedtime. 01/23/20   [provider]  metoprolol succinate (TOPROL-XL) 100 MG 24 hr tablet Take 100 mg by mouth in the morning. Take with or immediately following a meal.    [provider]  metoprolol tartrate (LOPRESSOR) 25 MG tablet Take 0.5 tablets (12.5 mg total) by mouth 2 (two) times daily. Patient not taking: Reported on 02/18/2022 03/23/21   Raiford Noble Latif, DO  montelukast (SINGULAIR) 10 MG tablet Take 10 mg by mouth daily. 01/23/20   [provider]  Multiple Vitamin (MULTIVITAMIN WITH MINERALS) TABS tablet Take 1 tablet by mouth daily. 03/24/21   Raiford Noble Latif, DO  nystatin ointment (MYCOSTATIN) Apply 1 application  topically 2 (two) times daily as needed (for rashes).    [provider]  omeprazole (PRILOSEC) 40 MG capsule Take 40 mg by mouth daily before breakfast. 01/23/20   [provider]  ondansetron (ZOFRAN) 4 MG tablet Take 1 tablet (4 mg total) by mouth every 6 (six) hours as needed for nausea. 03/23/21   Sheikh,  Omair Latif, DO  OZEMPIC, 2 MG/DOSE, 8 MG/3ML SOPN Inject 2 mg into the skin every Friday.    [provider]  PARoxetine (PAXIL) 40 MG tablet Take 1 tablet (40 mg total) by mouth every morning. 12/09/21   Arfeen, Arlyce Harman, MD  polyethylene glycol (MIRALAX / GLYCOLAX) 17 g packet Take 17 g by mouth daily as needed for mild constipation. Patient not taking: Reported on 02/18/2022 03/23/21   Raiford Noble Latif, DO  predniSONE (DELTASONE) 10 MG tablet Take 40 mg daily for 1 day, 30 mg daily for 1 day, 20 mg daily for 1 days,10 mg daily for 1 day, then stop 02/20/22   Jonetta Osgood, MD  PRESCRIPTION MEDICATION BiPAP- At bedtime    [provider]  rOPINIRole (REQUIP) 0.25 MG tablet TAKE 3 TABLETS BY MOUTH AT BEDTIME Patient taking differently: Take 0.75 mg by mouth at bedtime. 12/10/21   Ward Givens, NP  rosuvastatin (CRESTOR) 10 MG tablet Take 10 mg by mouth at bedtime. 09/16/21   Schmerge, Meredeth Ide, NP  traZODone (DESYREL) 100 MG tablet Take 1 tablet (100 mg total) by mouth at bedtime. 12/09/21   Arfeen, Arlyce Harman, MD  TRUEPLUS INSULIN SYRINGE 29G X 1/2" 1 ML MISC 1 each by Other route daily. 07/16/18   [provider]  VENTOLIN HFA 108 (90 Base) MCG/ACT inhaler Inhale 1-2 puffs into the lungs every 4 (four) hours as needed for wheezing or shortness of breath. 07/21/18   [provider]  ziprasidone (GEODON) 80 MG capsule Take 1 capsule (80 mg total) by mouth at bedtime. 12/09/21   Kathlee Nations, MD    Physical Exam: BP (!) 147/59   Pulse 64   Temp 98 F (36.7 C) (Oral)   Resp 15   Ht '5\' 2"'$  (1.575 m)   Wt 89 kg   SpO2 94%   BMI 35.89 kg/m   General: 71 y.o. year-old female well developed well nourished in no acute distress.  Alert and oriented x3. Cardiovascular: Regular rate and rhythm with no rubs or gallops.  No thyromegaly or JVD noted.  No lower extremity edema. 2/4 pulses in all 4 extremities. Respiratory: Clear to auscultation with no wheezes or  rales. Good inspiratory effort. Abdomen: Soft nontender nondistended with normal bowel sounds x4 quadrants. Muskuloskeletal: No cyanosis, clubbing or edema noted bilaterally Neuro: CN II-XII intact, strength, sensation, reflexes Skin: No ulcerative lesions noted  or rashes Psychiatry: Judgement and insight appear normal. Mood is appropriate for condition and setting          Labs on Admission:  Basic Metabolic Panel: Recent Labs  Lab 02/28/22 0004  NA 136  K 4.0  CL 107  CO2 19*  GLUCOSE 87  BUN 33*  CREATININE 1.62*  CALCIUM 9.0   Liver Function Tests: Recent Labs  Lab 02/28/22 0004  AST 29  ALT 20  ALKPHOS 84  BILITOT 0.6  PROT 5.3*  ALBUMIN 2.6*   No results for input(s): "LIPASE", "AMYLASE" in the last 168 hours. No results for input(s): "AMMONIA" in the last 168 hours. CBC: Recent Labs  Lab 02/28/22 0004  WBC 8.5  NEUTROABS 5.5  HGB 8.7*  HCT 28.4*  MCV 81.1  PLT 257   Cardiac Enzymes: No results for input(s): "CKTOTAL", "CKMB", "CKMBINDEX", "TROPONINI" in the last 168 hours.  BNP (last 3 results) Recent Labs    03/14/21 1700 03/19/21 1315 02/18/22 1630  BNP 34.2 59.3 76.1    ProBNP (last 3 results) No results for input(s): "PROBNP" in the last 8760 hours.  CBG: No results for input(s): "GLUCAP" in the last 168 hours.  Radiological Exams on Admission: CT HEAD WO CONTRAST (5MM)  Result Date: 02/28/2022 CLINICAL DATA:  Head trauma EXAM: CT HEAD WITHOUT CONTRAST CT CERVICAL SPINE WITHOUT CONTRAST TECHNIQUE: Multidetector CT imaging of the head and cervical spine was performed following the standard protocol without intravenous contrast. Multiplanar CT image reconstructions of the cervical spine were also generated. RADIATION DOSE REDUCTION: This exam was performed according to the departmental dose-optimization program which includes automated exposure control, adjustment of the mA and/or kV according to patient size and/or use of iterative  reconstruction technique. COMPARISON:  None Available. FINDINGS: CT HEAD FINDINGS Brain: There is no mass, hemorrhage or extra-axial collection. The size and configuration of the ventricles and extra-axial CSF spaces are normal. The brain parenchyma is normal, without evidence of acute or chronic infarction. Vascular: No abnormal hyperdensity of the major intracranial arteries or dural venous sinuses. No intracranial atherosclerosis. Skull: The visualized skull base, calvarium and extracranial soft tissues are normal. Sinuses/Orbits: No fluid levels or advanced mucosal thickening of the visualized paranasal sinuses. No mastoid or middle ear effusion. The orbits are normal. CT CERVICAL SPINE FINDINGS Alignment: No static subluxation. Facets are aligned. Occipital condyles are normally positioned. Skull base and vertebrae: No acute fracture. Soft tissues and spinal canal: No prevertebral fluid or swelling. No visible canal hematoma. Disc levels: Multilevel facet arthrosis Upper chest: No pneumothorax, pulmonary nodule or pleural effusion. Other: Normal visualized paraspinal cervical soft tissues. IMPRESSION: 1. No acute intracranial abnormality. 2. No acute fracture or static subluxation of the cervical spine. Electronically Signed   By: Ulyses Jarred M.D.   On: 02/28/2022 03:35   CT CERVICAL SPINE WO CONTRAST  Result Date: 02/28/2022 CLINICAL DATA:  Head trauma EXAM: CT HEAD WITHOUT CONTRAST CT CERVICAL SPINE WITHOUT CONTRAST TECHNIQUE: Multidetector CT imaging of the head and cervical spine was performed following the standard protocol without intravenous contrast. Multiplanar CT image reconstructions of the cervical spine were also generated. RADIATION DOSE REDUCTION: This exam was performed according to the departmental dose-optimization program which includes automated exposure control, adjustment of the mA and/or kV according to patient size and/or use of iterative reconstruction technique. COMPARISON:  None  Available. FINDINGS: CT HEAD FINDINGS Brain: There is no mass, hemorrhage or extra-axial collection. The size and configuration of the ventricles and extra-axial CSF spaces  are normal. The brain parenchyma is normal, without evidence of acute or chronic infarction. Vascular: No abnormal hyperdensity of the major intracranial arteries or dural venous sinuses. No intracranial atherosclerosis. Skull: The visualized skull base, calvarium and extracranial soft tissues are normal. Sinuses/Orbits: No fluid levels or advanced mucosal thickening of the visualized paranasal sinuses. No mastoid or middle ear effusion. The orbits are normal. CT CERVICAL SPINE FINDINGS Alignment: No static subluxation. Facets are aligned. Occipital condyles are normally positioned. Skull base and vertebrae: No acute fracture. Soft tissues and spinal canal: No prevertebral fluid or swelling. No visible canal hematoma. Disc levels: Multilevel facet arthrosis Upper chest: No pneumothorax, pulmonary nodule or pleural effusion. Other: Normal visualized paraspinal cervical soft tissues. IMPRESSION: 1. No acute intracranial abnormality. 2. No acute fracture or static subluxation of the cervical spine. Electronically Signed   By: Ulyses Jarred M.D.   On: 02/28/2022 03:35   DG Chest Port 1 View  Result Date: 02/28/2022 CLINICAL DATA:  Possible sepsis EXAM: PORTABLE CHEST 1 VIEW COMPARISON:  02/19/2022 FINDINGS: Cardiac shadow is within normal limits. Aortic calcifications are noted. Lungs are clear bilaterally. No bony abnormality is seen. IMPRESSION: No active disease. Electronically Signed   By: Inez Catalina M.D.   On: 02/28/2022 00:46    EKG: I independently viewed the EKG done and my findings are as followed: Sinus rhythm rate of 63.  Nonspecific ST-T changes.  QTc 418.  Assessment/Plan Present on Admission:  AMS (altered mental status)  Principal Problem:   AMS (altered mental status)  Acute metabolic encephalopathy, unclear  etiology CT head nonacute.  UA unremarkable.  Chest x-ray nonacute. Report of slurred speech. At baseline alert and oriented x 3.  Currently alert and oriented x 2. If no improvement consider MRI brain to rule out CVA. Reorient as needed Fall precautions  Non anion gap metabolic acidosis Serum bicarb 19, anion gap 10 Gentle IV fluid hydration Repeat labs in the morning  Essential hypertension Resume home oral antihypertensive Monitor vital signs  Type 2 diabetes with hyperglycemia Hold off home oral hypoglycemics Start insulin sliding scale every 4 hours while NPO  Hypothyroidism Resume home levothyroxine  Hyperlipidemia Resume home Crestor.  Obesity BMI 35 Recommend weight loss outpatient with regular physical activity and healthy dieting.  OSA on BiPAP Resume BiPAP nightly  Chronic anxiety/depression Resume when able to take oral.   DVT prophylaxis: Subcu Lovenox daily  Code Status: Full code  Family Communication: None at bedside  Disposition Plan: Admitted to telemetry medical unit  Consults called: None.  Admission status: Observation status   Status is: Observation    Kayleen Memos MD [facility] Pager [phone]  If 7PM-7AM, please contact night-coverage [email or web address] Password [facility]  02/28/2022, 5:31 AM

## 2022-02-28 NOTE — Assessment & Plan Note (Signed)
BMI 35.9 in the setting of hypertension, sleep apnea, diabetes

## 2022-02-28 NOTE — ED Provider Notes (Signed)
[facility] EMERGENCY DEPARTMENT Provider Note   CSN: [id number] Arrival date & time: 02/27/22  2352     History  No chief complaint on file.   Chelsea [name] is a 71 y.o. female.  Patient is brought to the emergency department from home by ambulance after a fall.  Patient has reportedly been slightly altered for the last couple of days and then tonight became acutely weak.  She fell and could not get off the floor.  EMS report that the patient was exhibiting confusion and slurred speech which is unusual for her.  At arrival to the ER, patient knows she is in a hospital, not which hospital and does not know the month or day.  This is not what her normal baseline would be.  She denies any injury or pain upon arrival.  Family reports that she has been complaining of painful urination.       Home Medications Prior to Admission medications   Medication Sig Start Date End Date Taking? Authorizing Provider  acetaminophen (TYLENOL) 325 MG tablet Take 650 mg by mouth every 6 (six) hours as needed for moderate pain.    [provider]  albuterol (PROVENTIL) (2.5 MG/3ML) 0.083% nebulizer solution Take 3 mLs (2.5 mg total) by nebulization every 6 (six) hours as needed for wheezing or shortness of breath. 02/20/22   Ghimire, Henreitta Leber, MD  amLODipine (NORVASC) 2.5 MG tablet Take 2.5 mg by mouth daily.    [provider]  bumetanide (BUMEX) 1 MG tablet Take 1 tablet (1 mg total) by mouth daily. 12/04/12   Delfina Redwood, MD  chlorpheniramine-HYDROcodone (TUSSIONEX) 10-8 MG/5ML SUER Take 5 mLs by mouth every 12 (twelve) hours as needed for cough. cough 03/14/21   [provider]  Cholecalciferol (VITAMIN D3) 5000 units TABS Take 5,000 Units by mouth daily.    [provider]  clonazePAM (KLONOPIN) 1 MG tablet Take 1 tablet (1 mg total) by mouth at bedtime. 12/09/21   Arfeen, Arlyce Harman, MD  diclofenac sodium (VOLTAREN) 1 % GEL Apply 2 g topically  4 (four) times daily as needed (pain).    [provider]  Ensure Max Protein (ENSURE MAX PROTEIN) LIQD Take 330 mLs (11 oz total) by mouth 2 (two) times daily. 03/23/21   Raiford Noble Latif, DO  ferrous gluconate (FERGON) 324 MG tablet Take 324 mg by mouth daily with breakfast.    [provider]  fluticasone (FLONASE) 50 MCG/ACT nasal spray Place 2 sprays into both nostrils daily.    [provider]  fluticasone furoate-vilanterol (BREO ELLIPTA) 200-25 MCG/INH AEPB Inhale 1 puff into the lungs every morning.    [provider]  insulin aspart (NOVOLOG FLEXPEN) 100 UNIT/ML FlexPen Inject 2-12 Units into the skin 3 (three) times daily with meals. Sliding scale    [provider]  LEVEMIR 100 UNIT/ML injection Inject 30 Units into the skin at bedtime. 07/21/18   [provider]  levothyroxine (SYNTHROID) 125 MCG tablet Take 125 mcg by mouth daily before breakfast. 07/21/18   [provider]  metFORMIN (GLUCOPHAGE) 500 MG tablet Take 500 mg by mouth in the morning and at bedtime. 01/23/20   [provider]  metoprolol succinate (TOPROL-XL) 100 MG 24 hr tablet Take 100 mg by mouth in the morning. Take with or immediately following a meal.    [provider]  metoprolol tartrate (LOPRESSOR) 25 MG tablet Take 0.5 tablets (12.5 mg total) by mouth 2 (two) times  daily. Patient not taking: Reported on 02/18/2022 03/23/21   Raiford Noble Latif, DO  montelukast (SINGULAIR) 10 MG tablet Take 10 mg by mouth daily. 01/23/20   [provider]  Multiple Vitamin (MULTIVITAMIN WITH MINERALS) TABS tablet Take 1 tablet by mouth daily. 03/24/21   Raiford Noble Latif, DO  nystatin ointment (MYCOSTATIN) Apply 1 application  topically 2 (two) times daily as needed (for rashes).    [provider]  omeprazole (PRILOSEC) 40 MG capsule Take 40 mg by mouth daily before breakfast. 01/23/20   [provider]  ondansetron  (ZOFRAN) 4 MG tablet Take 1 tablet (4 mg total) by mouth every 6 (six) hours as needed for nausea. 03/23/21   Sheikh, Omair Latif, DO  OZEMPIC, 2 MG/DOSE, 8 MG/3ML SOPN Inject 2 mg into the skin every Friday.    [provider]  PARoxetine (PAXIL) 40 MG tablet Take 1 tablet (40 mg total) by mouth every morning. 12/09/21   Arfeen, Arlyce Harman, MD  polyethylene glycol (MIRALAX / GLYCOLAX) 17 g packet Take 17 g by mouth daily as needed for mild constipation. Patient not taking: Reported on 02/18/2022 03/23/21   Raiford Noble Latif, DO  predniSONE (DELTASONE) 10 MG tablet Take 40 mg daily for 1 day, 30 mg daily for 1 day, 20 mg daily for 1 days,10 mg daily for 1 day, then stop 02/20/22   Jonetta Osgood, MD  PRESCRIPTION MEDICATION BiPAP- At bedtime    [provider]  rOPINIRole (REQUIP) 0.25 MG tablet TAKE 3 TABLETS BY MOUTH AT BEDTIME Patient taking differently: Take 0.75 mg by mouth at bedtime. 12/10/21   Ward Givens, NP  rosuvastatin (CRESTOR) 10 MG tablet Take 10 mg by mouth at bedtime. 09/16/21   Schmerge, Meredeth Ide, NP  traZODone (DESYREL) 100 MG tablet Take 1 tablet (100 mg total) by mouth at bedtime. 12/09/21   Arfeen, Arlyce Harman, MD  TRUEPLUS INSULIN SYRINGE 29G X 1/2" 1 ML MISC 1 each by Other route daily. 07/16/18   [provider]  VENTOLIN HFA 108 (90 Base) MCG/ACT inhaler Inhale 1-2 puffs into the lungs every 4 (four) hours as needed for wheezing or shortness of breath. 07/21/18   [provider]  ziprasidone (GEODON) 80 MG capsule Take 1 capsule (80 mg total) by mouth at bedtime. 12/09/21   Arfeen, Arlyce Harman, MD      Allergies    Darvocet [propoxyphene n-acetaminophen], Fluoxetine, Penicillins, and Promethazine    Review of Systems   Review of Systems  Physical Exam Updated Vital Signs BP (!) 121/91   Pulse 67   Temp 98 F (36.7 C) (Oral)   Resp 17   Ht '5\' 2"'$  (1.575 m)   Wt 89 kg   SpO2 95%   BMI 35.89 kg/m  Physical Exam Vitals and nursing  note reviewed.  Constitutional:      General: She is not in acute distress.    Appearance: She is well-developed.  HENT:     Head: Normocephalic and atraumatic.     Mouth/Throat:     Mouth: Mucous membranes are moist.  Eyes:     General: Vision grossly intact. Gaze aligned appropriately.     Extraocular Movements: Extraocular movements intact.     Conjunctiva/sclera: Conjunctivae normal.  Cardiovascular:     Rate and Rhythm: Normal rate and regular rhythm.     Pulses: Normal pulses.     Heart sounds: Normal heart sounds, S1 normal and S2 normal. No murmur heard.  No friction rub. No gallop.  Pulmonary:     Effort: Pulmonary effort is normal. No respiratory distress.     Breath sounds: Normal breath sounds.  Abdominal:     General: Bowel sounds are normal.     Palpations: Abdomen is soft.     Tenderness: There is no abdominal tenderness. There is no guarding or rebound.     Hernia: No hernia is present.  Musculoskeletal:        General: No swelling.     Cervical back: Full passive range of motion without pain, normal range of motion and neck supple. No spinous process tenderness or muscular tenderness. Normal range of motion.     Right lower leg: No edema.     Left lower leg: No edema.  Skin:    General: Skin is warm and dry.     Capillary Refill: Capillary refill takes less than 2 seconds.     Findings: No ecchymosis, erythema, rash or wound.  Neurological:     General: No focal deficit present.     Mental Status: She is alert. She is disoriented and confused.     GCS: GCS eye subscore is 4. GCS verbal subscore is 4. GCS motor subscore is 6.     Cranial Nerves: Cranial nerves 2-12 are intact.     Sensory: Sensation is intact.     Motor: Motor function is intact.     Coordination: Coordination is intact.  Psychiatric:        Attention and Perception: Attention normal.        Mood and Affect: Mood normal.        Speech: Speech normal.     ED Results / Procedures /  Treatments   Labs (all labs ordered are listed, but only abnormal results are displayed) Labs Reviewed  LACTIC ACID, PLASMA - Abnormal; Notable for the following components:      Result Value   Lactic Acid, Venous 2.4 (*)    All other components within normal limits  COMPREHENSIVE METABOLIC PANEL - Abnormal; Notable for the following components:   CO2 19 (*)    BUN 33 (*)    Creatinine, Ser 1.62 (*)    Total Protein 5.3 (*)    Albumin 2.6 (*)    GFR, Estimated 34 (*)    All other components within normal limits  CBC WITH DIFFERENTIAL/PLATELET - Abnormal; Notable for the following components:   RBC 3.50 (*)    Hemoglobin 8.7 (*)    HCT 28.4 (*)    MCH 24.9 (*)    All other components within normal limits  APTT - Abnormal; Notable for the following components:   aPTT 22 (*)    All other components within normal limits  URINALYSIS, ROUTINE W REFLEX MICROSCOPIC - Abnormal; Notable for the following components:   Protein, ur 100 (*)    Leukocytes,Ua TRACE (*)    Bacteria, UA RARE (*)    All other components within normal limits  CULTURE, BLOOD (ROUTINE X 2)  CULTURE, BLOOD (ROUTINE X 2)  URINE CULTURE  LACTIC ACID, PLASMA  PROTIME-INR    EKG EKG Interpretation  Date/Time:  Thursday February 27 2022 23:56:18 EST Ventricular Rate:  63 PR Interval:  164 QRS Duration: 93 QT Interval:  408 QTC Calculation: 418 R Axis:   -13 Text Interpretation: Sinus rhythm Normal ECG Confirmed by Orpah Greek [phone]) on 02/28/2022 12:13:58 AM  Radiology CT HEAD WO CONTRAST (5MM)  Result Date: 02/28/2022 CLINICAL DATA:  Head trauma EXAM: CT HEAD WITHOUT CONTRAST CT CERVICAL SPINE WITHOUT CONTRAST TECHNIQUE: Multidetector CT imaging of the head and cervical spine was performed following the standard protocol without intravenous contrast. Multiplanar CT image reconstructions of the cervical spine were also generated. RADIATION DOSE REDUCTION: This exam was performed according to the  departmental dose-optimization program which includes automated exposure control, adjustment of the mA and/or kV according to patient size and/or use of iterative reconstruction technique. COMPARISON:  None Available. FINDINGS: CT HEAD FINDINGS Brain: There is no mass, hemorrhage or extra-axial collection. The size and configuration of the ventricles and extra-axial CSF spaces are normal. The brain parenchyma is normal, without evidence of acute or chronic infarction. Vascular: No abnormal hyperdensity of the major intracranial arteries or dural venous sinuses. No intracranial atherosclerosis. Skull: The visualized skull base, calvarium and extracranial soft tissues are normal. Sinuses/Orbits: No fluid levels or advanced mucosal thickening of the visualized paranasal sinuses. No mastoid or middle ear effusion. The orbits are normal. CT CERVICAL SPINE FINDINGS Alignment: No static subluxation. Facets are aligned. Occipital condyles are normally positioned. Skull base and vertebrae: No acute fracture. Soft tissues and spinal canal: No prevertebral fluid or swelling. No visible canal hematoma. Disc levels: Multilevel facet arthrosis Upper chest: No pneumothorax, pulmonary nodule or pleural effusion. Other: Normal visualized paraspinal cervical soft tissues. IMPRESSION: 1. No acute intracranial abnormality. 2. No acute fracture or static subluxation of the cervical spine. Electronically Signed   By: Ulyses Jarred M.D.   On: 02/28/2022 03:35   CT CERVICAL SPINE WO CONTRAST  Result Date: 02/28/2022 CLINICAL DATA:  Head trauma EXAM: CT HEAD WITHOUT CONTRAST CT CERVICAL SPINE WITHOUT CONTRAST TECHNIQUE: Multidetector CT imaging of the head and cervical spine was performed following the standard protocol without intravenous contrast. Multiplanar CT image reconstructions of the cervical spine were also generated. RADIATION DOSE REDUCTION: This exam was performed according to the departmental dose-optimization program  which includes automated exposure control, adjustment of the mA and/or kV according to patient size and/or use of iterative reconstruction technique. COMPARISON:  None Available. FINDINGS: CT HEAD FINDINGS Brain: There is no mass, hemorrhage or extra-axial collection. The size and configuration of the ventricles and extra-axial CSF spaces are normal. The brain parenchyma is normal, without evidence of acute or chronic infarction. Vascular: No abnormal hyperdensity of the major intracranial arteries or dural venous sinuses. No intracranial atherosclerosis. Skull: The visualized skull base, calvarium and extracranial soft tissues are normal. Sinuses/Orbits: No fluid levels or advanced mucosal thickening of the visualized paranasal sinuses. No mastoid or middle ear effusion. The orbits are normal. CT CERVICAL SPINE FINDINGS Alignment: No static subluxation. Facets are aligned. Occipital condyles are normally positioned. Skull base and vertebrae: No acute fracture. Soft tissues and spinal canal: No prevertebral fluid or swelling. No visible canal hematoma. Disc levels: Multilevel facet arthrosis Upper chest: No pneumothorax, pulmonary nodule or pleural effusion. Other: Normal visualized paraspinal cervical soft tissues. IMPRESSION: 1. No acute intracranial abnormality. 2. No acute fracture or static subluxation of the cervical spine. Electronically Signed   By: Ulyses Jarred M.D.   On: 02/28/2022 03:35   DG Chest Port 1 View  Result Date: 02/28/2022 CLINICAL DATA:  Possible sepsis EXAM: PORTABLE CHEST 1 VIEW COMPARISON:  02/19/2022 FINDINGS: Cardiac shadow is within normal limits. Aortic calcifications are noted. Lungs are clear bilaterally. No bony abnormality is seen. IMPRESSION: No active disease. Electronically Signed   By: Inez Catalina M.D.   On: 02/28/2022 00:46    Procedures Procedures  Medications Ordered in ED Medications  sodium chloride 0.9 % bolus 500 mL (0 mLs Intravenous Stopped 02/28/22 [id number])     ED Course/ Medical Decision Making/ A&P                           Medical Decision Making Amount and/or Complexity of Data Reviewed Independent Historian: caregiver    Details: Sister Fabienne Bruns) External Data Reviewed: labs, ECG and notes. Labs: ordered. Decision-making details documented in ED Course. Radiology: ordered and independent interpretation performed. Decision-making details documented in ED Course. ECG/medicine tests: ordered and independent interpretation performed. Decision-making details documented in ED Course.   Patient arrived for evaluation of altered mental status.  She had a fall tonight when is unable to get up off the floor.  On arrival she appears confused but in no distress.  She denies any pain or ongoing complaints.  I was able to discuss the patient's past history and current presentation with her sister, Fabienne Bruns [phone].  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  Her current presentation, however, is very different than her typical mental status.  She normally is pleasant, knows her address, phone number, is oriented to time and place.  She does have a history of seizures as a child, was formally on Dilantin, but as an adult has not had any seizures.  Sister reports that initially when she was found on the ground in the bathroom, she could not talk, did not seem to understand anything that was being sent to her.  She seems to have slowly cleared.  Seizure would be a consideration.  I cannot rule out stroke, head CT did not show any acute abnormality.  Patient clearly exhibiting a significant change in her level of consciousness and mental status.  This could be secondary to her psychiatric history, but she will need further medical workup prior to determining that.        Final Clinical Impression(s) / ED Diagnoses Final diagnoses:  Altered mental  status, unspecified altered mental status type    Rx / DC Orders ED Discharge Orders     None         Babette Stum, Gwenyth Allegra, MD 02/28/22 [phone]

## 2022-02-28 NOTE — ED Notes (Signed)
EEG tech at bedside. 

## 2022-02-28 NOTE — ED Notes (Signed)
Patient was attempting to get out of bed. I caught her standing at the end of the bed with all wires pulled off hollering somebody help me. Reoriented patient and explained to her that she can not get up without assistance or she will fall also explained the importance and use of call light.

## 2022-02-28 NOTE — ED Notes (Signed)
PT working with patient.

## 2022-02-28 NOTE — ED Notes (Signed)
Patient being transported to MRI

## 2022-02-28 NOTE — Consult Note (Signed)
NEURO HOSPITALIST CONSULT NOTE   Requestig physician: Dr. Loleta Books  Reason for Consult: Possible unwitnessed seizure  History obtained from:  Patient and Chart     HPI:                                                                                                                                          Chelsea [name] is a 71 y.o. female with a PMHx of anxiety, arthritis, bipolar disorder, schizophrenia, COPD, CKD3, DM, HLD, HTN, chronic diastolic CHF, hypothyroidism, obesity and OSA (on BIPAP), with recent admission on 11/21 for management of acute respiratory failure with hypoxia in the context of newly diagnosed COVID infection, who presented to the [facility] on Thursday night for evaluation after she had unwitnessed falls at home in addition to a few days of slurred speech and confusion followed by acute onset of weakness on Thursday. She had also been having dysuria for several days. With one of the falls, she was unable to get off the floor. Her sister reported that Sister reports that initially when the patient was found on the ground in the bathroom, she could not talk and did not seem to understand anything that was being said to her. She seemed to have slowly cleared in the ED, with EDP assessment noting that seizure was a differential diagnostic consideration. Head CT did not show any acute abnormality. MRI brain was also normal.   EEG was performed this afternoon, which was abnormal, with continuous left hemispheric slowing, maximal in the left temporal region. The findings were suggestive of possible postictal state. Dr. Hortense Ramal recommended Neurology consultation.     Past Medical History:  Diagnosis Date   Anxiety    Arthritis    Bipolar disorder (HCC)    COPD (chronic obstructive pulmonary disease) (HCC)    Diabetes mellitus type II    Esophageal stricture    moderate distal   GERD (gastroesophageal reflux disease)    Heart murmur    "HAD AN ECHO YEARS AGO-  NOTHING TO BE CONCERNED ABOUT"   Hyperlipemia    Hypertension    Hypothyroidism    Mental disorder    Bipolar   Obesity    Oxygen desaturation during sleep    wears 2 liters at bedtime    Schizophrenia ([place])    patient reports that she is unaware of this   Shortness of breath    with a little activy   Sleep apnea    wears 2 liters of oxygen at bedtime instead of CPAP    Past Surgical History:  Procedure Laterality Date   BALLOON DILATION N/A 06/10/2016   Procedure: BALLOON DILATION;  Surgeon: Wilford Corner, MD;  Location: [place];  Service: Endoscopy;  Laterality: N/A;   BREAST EXCISIONAL BIOPSY Left 05/10/2012  benign   CHOLECYSTECTOMY     COLONOSCOPY WITH PROPOFOL N/A 01/30/2017   Procedure: COLONOSCOPY WITH PROPOFOL;  Surgeon: Wilford Corner, MD;  Location: [place];  Service: Endoscopy;  Laterality: N/A;   COLONOSCOPY WITH PROPOFOL N/A 09/29/2020   Procedure: COLONOSCOPY WITH PROPOFOL;  Surgeon: Clarene Essex, MD;  Location: [facility] ENDOSCOPY;  Service: Endoscopy;  Laterality: N/A;   ESOPHAGEAL MANOMETRY N/A 12/19/2020   Procedure: ESOPHAGEAL MANOMETRY (EM);  Surgeon: Wilford Corner, MD;  Location: [facility] ENDOSCOPY;  Service: Endoscopy;  Laterality: N/A;   ESOPHAGOGASTRODUODENOSCOPY N/A 06/03/2016   Procedure: ESOPHAGOGASTRODUODENOSCOPY (EGD);  Surgeon: Wilford Corner, MD;  Location: [facility] ENDOSCOPY;  Service: Endoscopy;  Laterality: N/A;   ESOPHAGOGASTRODUODENOSCOPY N/A 06/10/2016   Procedure: ESOPHAGOGASTRODUODENOSCOPY (EGD);  Surgeon: Wilford Corner, MD;  Location: [facility] ENDOSCOPY;  Service: Endoscopy;  Laterality: N/A;   ESOPHAGOGASTRODUODENOSCOPY (EGD) WITH PROPOFOL N/A 09/28/2020   Procedure: ESOPHAGOGASTRODUODENOSCOPY (EGD) WITH PROPOFOL;  Surgeon: Arta Silence, MD;  Location: [facility] ENDOSCOPY;  Service: Endoscopy;  Laterality: N/A;   EYE SURGERY     PARTIAL MASTECTOMY WITH NEEDLE LOCALIZATION Left 05/10/2012   Procedure: PARTIAL MASTECTOMY WITH NEEDLE LOCALIZATION;   Surgeon: Adin Hector, MD;  Location: [place];  Service: General;  Laterality: Left;   POLYPECTOMY  09/29/2020   Procedure: POLYPECTOMY;  Surgeon: Clarene Essex, MD;  Location: [facility] ENDOSCOPY;  Service: Endoscopy;;   TONSILLECTOMY      Family History  Problem Relation Age of Onset   Alzheimer's disease Mother    Alcohol abuse Father    Stroke Father    Sleep apnea Sister    Restless legs syndrome Neg Hx              Social History:  reports that she quit smoking about 28 years ago. Her smoking use included cigarettes. She has a 87.00 pack-year smoking history. She has never used smokeless tobacco. She reports that she does not drink alcohol and does not use drugs.  Allergies  Allergen Reactions   Darvocet [Propoxyphene N-Acetaminophen] Anaphylaxis    Can take plain tylenol   Fluoxetine Other (See Comments)    Hallucinations    Penicillins Rash   Promethazine Hives    MEDICATIONS:                                                                                                                     Prior to Admission:  Medications Prior to Admission  Medication Sig Dispense Refill Last Dose   acetaminophen (TYLENOL) 325 MG tablet Take 650 mg by mouth every 6 (six) hours as needed for moderate pain.   unk   albuterol (PROVENTIL) (2.5 MG/3ML) 0.083% nebulizer solution Take 3 mLs (2.5 mg total) by nebulization every 6 (six) hours as needed for wheezing or shortness of breath. 75 mL 12 unk   amLODipine (NORVASC) 2.5 MG tablet Take 2.5 mg by mouth daily.   02/27/2022   bumetanide (BUMEX) 1 MG tablet Take 1 tablet (1 mg total) by mouth daily. 30 tablet 0 02/27/2022   chlorpheniramine-HYDROcodone (  TUSSIONEX) 10-8 MG/5ML SUER Take 5 mLs by mouth every 12 (twelve) hours as needed for cough. cough   02/27/2022   Cholecalciferol (VITAMIN D3) 5000 units TABS Take 5,000 Units by mouth daily.   02/27/2022   clonazePAM (KLONOPIN) 1 MG tablet Take 1 tablet (1 mg total) by mouth at bedtime. 30 tablet 2  02/27/2022   diclofenac sodium (VOLTAREN) 1 % GEL Apply 2 g topically 4 (four) times daily as needed (pain).   unk   Ensure Max Protein (ENSURE MAX PROTEIN) LIQD Take 330 mLs (11 oz total) by mouth 2 (two) times daily. 3300 mL 0 02/27/2022   ferrous gluconate (FERGON) 324 MG tablet Take 324 mg by mouth daily with breakfast.   02/27/2022   fluticasone (FLONASE) 50 MCG/ACT nasal spray Place 2 sprays into both nostrils daily.   02/27/2022   fluticasone furoate-vilanterol (BREO ELLIPTA) 200-25 MCG/INH AEPB Inhale 1 puff into the lungs every morning.   unk   insulin aspart (NOVOLOG FLEXPEN) 100 UNIT/ML FlexPen Inject 2-12 Units into the skin 3 (three) times daily with meals. Sliding scale   unk   LEVEMIR 100 UNIT/ML injection Inject 30 Units into the skin at bedtime.   02/27/2022   levothyroxine (SYNTHROID) 125 MCG tablet Take 125 mcg by mouth daily before breakfast.   02/27/2022   metFORMIN (GLUCOPHAGE) 500 MG tablet Take 500 mg by mouth in the morning and at bedtime.   02/27/2022   metoprolol succinate (TOPROL-XL) 100 MG 24 hr tablet Take 100 mg by mouth in the morning. Take with or immediately following a meal.   02/27/2022 at 0800   metoprolol tartrate (LOPRESSOR) 25 MG tablet Take 0.5 tablets (12.5 mg total) by mouth 2 (two) times daily. 60 tablet 0 02/27/2022 at 1830   montelukast (SINGULAIR) 10 MG tablet Take 10 mg by mouth daily.   02/27/2022   Multiple Vitamin (MULTIVITAMIN WITH MINERALS) TABS tablet Take 1 tablet by mouth daily. 30 tablet 0 02/27/2022   nystatin ointment (MYCOSTATIN) Apply 1 application  topically 2 (two) times daily as needed (for rashes).   unk   omeprazole (PRILOSEC) 40 MG capsule Take 40 mg by mouth daily before breakfast.   02/27/2022   ondansetron (ZOFRAN) 4 MG tablet Take 1 tablet (4 mg total) by mouth every 6 (six) hours as needed for nausea. 20 tablet 0 unk   OZEMPIC, 2 MG/DOSE, 8 MG/3ML SOPN Inject 2 mg into the skin every Friday.   02/21/2022   PARoxetine (PAXIL) 40  MG tablet Take 1 tablet (40 mg total) by mouth every morning. 90 tablet 0 02/27/2022   polyethylene glycol (MIRALAX / GLYCOLAX) 17 g packet Take 17 g by mouth daily as needed for mild constipation. 14 each 0 unk   rOPINIRole (REQUIP) 0.25 MG tablet TAKE 3 TABLETS BY MOUTH AT BEDTIME (Patient taking differently: Take 0.75 mg by mouth at bedtime.) 90 tablet 5 02/27/2022   rosuvastatin (CRESTOR) 10 MG tablet Take 10 mg by mouth at bedtime.   02/27/2022   traZODone (DESYREL) 100 MG tablet Take 1 tablet (100 mg total) by mouth at bedtime. 90 tablet 0 02/27/2022   VENTOLIN HFA 108 (90 Base) MCG/ACT inhaler Inhale 1-2 puffs into the lungs every 4 (four) hours as needed for wheezing or shortness of breath.   unk   ziprasidone (GEODON) 80 MG capsule Take 1 capsule (80 mg total) by mouth at bedtime. 90 capsule 0 02/27/2022   molnupiravir EUA (LAGEVRIO) 200 MG CAPS capsule 4 capsules Orally every 12  hrs x 5 days for 5 day(s) (Patient not taking: Reported on 02/28/2022)   Completed Course   predniSONE (DELTASONE) 10 MG tablet Take 40 mg daily for 1 day, 30 mg daily for 1 day, 20 mg daily for 1 days,10 mg daily for 1 day, then stop (Patient not taking: Reported on 02/28/2022) 10 tablet 0 Completed Course   PRESCRIPTION MEDICATION BiPAP- At bedtime      TRUEPLUS INSULIN SYRINGE 29G X 1/2" 1 ML MISC 1 each by Other route daily.      Scheduled:  amLODipine  2.5 mg Oral Daily   enoxaparin (LOVENOX) injection  40 mg Subcutaneous Daily   insulin aspart  0-9 Units Subcutaneous Q4H   levothyroxine  125 mcg Oral QAC breakfast   montelukast  10 mg Oral Daily   ziprasidone  80 mg Oral QHS     ROS:                                                                                                                                       As per HPI. She does not endorse any additional symptoms.    Blood pressure (!) 161/78, pulse 78, temperature 98.2 F (36.8 C), temperature source Oral, resp. rate 17, height '5\' 2"'$  (1.575  m), weight 89 kg, SpO2 100 %.   General Examination:                                                                                                       Physical Exam  HEENT-  [place]/AT    Lungs- Respirations unlabored Extremities- No edema   Neurological Examination Mental Status: Awake and alert. Speech is fluent with intact comprehension of all questions and commands. Naming for common words is intact. No dysarthria. Oriented to the city, state and month, but not the day or year. Able to recall who the president is. Mildly increased latencies of verbal responses.   Cranial Nerves: II: Temporal visual fields intact with no extinction to DSS. PERRL. III,IV, VI: No ptosis. EOMI. No nystagmus.  V: Temp sensation equal bilaterally VII: Smile symmetric VIII: Hearing intact to voice IX,X: No hypophonia or hoarseness XI: Symmetric XII: Midline tongue extension Motor: Right : Upper extremity   5/5    Left:     Upper extremity   5/5  Lower extremity   5/5     Lower extremity   5/5 No pronator drift Orbiting fingers test is subtly positive on the left Sensory: Temp and light  touch intact throughout, bilaterally. No extinction to DSS.  Deep Tendon Reflexes: 3+ bilateral biceps, brachioradialis and patellars.  Cerebellar: No ataxia with FNF bilaterally. Subtle action tremor bilaterally  Gait: Deferred   Lab Results: Basic Metabolic Panel: Recent Labs  Lab 02/28/22 0004 02/28/22 0604 02/28/22 1655  NA 136  --  135  K 4.0  --  4.0  CL 107  --   --   CO2 19*  --   --   GLUCOSE 87  --   --   BUN 33*  --   --   CREATININE 1.62* 1.24*  --   CALCIUM 9.0  --   --     CBC: Recent Labs  Lab 02/28/22 0004 02/28/22 1444 02/28/22 1655  WBC 8.5 8.2  --   NEUTROABS 5.5  --   --   HGB 8.7* 9.0* 9.5*  HCT 28.4* 28.4* 28.0*  MCV 81.1 77.8*  --   PLT 257 293  --     Cardiac Enzymes: No results for input(s): "CKTOTAL", "CKMB", "CKMBINDEX", "TROPONINI" in the last 168 hours.  Lipid  Panel: No results for input(s): "CHOL", "TRIG", "HDL", "CHOLHDL", "VLDL", "LDLCALC" in the last 168 hours.  Imaging: EEG adult  Result Date: 02/28/2022 Lora Havens, MD     02/28/2022  4:09 PM Patient Name: SHAQUOYA COSPER MRN: [id number] Epilepsy Attending: Lora Havens Referring Physician/Provider: Edwin Dada, MD Date: 02/28/2022 Duration: 23.10 mins Patient history: 71yo F with ams. EEG to evaluate for seizure. Level of alertness: Awake AEDs during EEG study: None Technical aspects: This EEG study was done with scalp electrodes positioned according to the 10-20 International system of electrode placement. Electrical activity was reviewed with band pass filter of 1-'70Hz'$ , sensitivity of 7 uV/mm, display speed of 58m/sec with a '60Hz'$  notched filter applied as appropriate. EEG data were recorded continuously and digitally stored.  Video monitoring was available and reviewed as appropriate. Description: The posterior dominant rhythm consists of 8 Hz activity of moderate voltage (25-35 uV) seen predominantly in posterior head regions, symmetric and reactive to eye opening and eye closing. EEG showed continuous 3 to 6 Hz theta-delta slowing in left hemisphere, maximal left temporal region. Hyperventilation and photic stimulation were not performed.   ABNORMALITY - Continuous slow, left hemisphere, maximal left temporal region. IMPRESSION: This study is suggestive of cortical dysfunction arising from left hemisphere, maximal left temporal region likely secondary to underlying structural abnormality, post-ictal state. No seizures or epileptiform discharges were seen throughout the recording. Dr. DLoleta Bookswas notified. PLora Havens  MR BRAIN WO CONTRAST  Result Date: 02/28/2022 CLINICAL DATA:  Altered mental status EXAM: MRI HEAD WITHOUT CONTRAST TECHNIQUE: Multiplanar, multiecho pulse sequences of the brain and surrounding structures were obtained without intravenous contrast. COMPARISON:   Same-day CT head. FINDINGS: Brain: No acute infarction, hemorrhage, hydrocephalus, extra-axial collection or mass lesion. Sequela of mild chronic microvascular ischemic change. Vascular: Normal flow voids. Skull and upper cervical spine: Normal marrow signal. Sinuses/Orbits: There is mucosal thickening in the right maxillary and ethmoid sinus. Other: None. IMPRESSION: No acute intracranial abnormality. Electronically Signed   By: HMarin RobertsM.D.   On: 02/28/2022 08:41   CT HEAD WO CONTRAST (5MM)  Result Date: 02/28/2022 CLINICAL DATA:  Head trauma EXAM: CT HEAD WITHOUT CONTRAST CT CERVICAL SPINE WITHOUT CONTRAST TECHNIQUE: Multidetector CT imaging of the head and cervical spine was performed following the standard protocol without intravenous contrast. Multiplanar CT image reconstructions of the cervical spine were also  generated. RADIATION DOSE REDUCTION: This exam was performed according to the departmental dose-optimization program which includes automated exposure control, adjustment of the mA and/or kV according to patient size and/or use of iterative reconstruction technique. COMPARISON:  None Available. FINDINGS: CT HEAD FINDINGS Brain: There is no mass, hemorrhage or extra-axial collection. The size and configuration of the ventricles and extra-axial CSF spaces are normal. The brain parenchyma is normal, without evidence of acute or chronic infarction. Vascular: No abnormal hyperdensity of the major intracranial arteries or dural venous sinuses. No intracranial atherosclerosis. Skull: The visualized skull base, calvarium and extracranial soft tissues are normal. Sinuses/Orbits: No fluid levels or advanced mucosal thickening of the visualized paranasal sinuses. No mastoid or middle ear effusion. The orbits are normal. CT CERVICAL SPINE FINDINGS Alignment: No static subluxation. Facets are aligned. Occipital condyles are normally positioned. Skull base and vertebrae: No acute fracture. Soft tissues and  spinal canal: No prevertebral fluid or swelling. No visible canal hematoma. Disc levels: Multilevel facet arthrosis Upper chest: No pneumothorax, pulmonary nodule or pleural effusion. Other: Normal visualized paraspinal cervical soft tissues. IMPRESSION: 1. No acute intracranial abnormality. 2. No acute fracture or static subluxation of the cervical spine. Electronically Signed   By: Ulyses Jarred M.D.   On: 02/28/2022 03:35   CT CERVICAL SPINE WO CONTRAST  Result Date: 02/28/2022 CLINICAL DATA:  Head trauma EXAM: CT HEAD WITHOUT CONTRAST CT CERVICAL SPINE WITHOUT CONTRAST TECHNIQUE: Multidetector CT imaging of the head and cervical spine was performed following the standard protocol without intravenous contrast. Multiplanar CT image reconstructions of the cervical spine were also generated. RADIATION DOSE REDUCTION: This exam was performed according to the departmental dose-optimization program which includes automated exposure control, adjustment of the mA and/or kV according to patient size and/or use of iterative reconstruction technique. COMPARISON:  None Available. FINDINGS: CT HEAD FINDINGS Brain: There is no mass, hemorrhage or extra-axial collection. The size and configuration of the ventricles and extra-axial CSF spaces are normal. The brain parenchyma is normal, without evidence of acute or chronic infarction. Vascular: No abnormal hyperdensity of the major intracranial arteries or dural venous sinuses. No intracranial atherosclerosis. Skull: The visualized skull base, calvarium and extracranial soft tissues are normal. Sinuses/Orbits: No fluid levels or advanced mucosal thickening of the visualized paranasal sinuses. No mastoid or middle ear effusion. The orbits are normal. CT CERVICAL SPINE FINDINGS Alignment: No static subluxation. Facets are aligned. Occipital condyles are normally positioned. Skull base and vertebrae: No acute fracture. Soft tissues and spinal canal: No prevertebral fluid or  swelling. No visible canal hematoma. Disc levels: Multilevel facet arthrosis Upper chest: No pneumothorax, pulmonary nodule or pleural effusion. Other: Normal visualized paraspinal cervical soft tissues. IMPRESSION: 1. No acute intracranial abnormality. 2. No acute fracture or static subluxation of the cervical spine. Electronically Signed   By: Ulyses Jarred M.D.   On: 02/28/2022 03:35   DG Chest Port 1 View  Result Date: 02/28/2022 CLINICAL DATA:  Possible sepsis EXAM: PORTABLE CHEST 1 VIEW COMPARISON:  02/19/2022 FINDINGS: Cardiac shadow is within normal limits. Aortic calcifications are noted. Lungs are clear bilaterally. No bony abnormality is seen. IMPRESSION: No active disease. Electronically Signed   By: Inez Catalina M.D.   On: 02/28/2022 00:46     Assessment: 71 year old female presenting with a few days of slurred speech and mild confusion followed by acute onset of weakness and transient expressive aphasia on Thursday. EEG revealed findings in the left hemisphere suggestive of possible postictal state.  - Exam  reveals mild to moderate disorientation without aphasia. Only focal motor finding is a subtle positive on the left with orbiting fingers test.  - MRI brain: No acute infarction, hemorrhage, hydrocephalus, extra-axial collection or mass lesion. Noted are sequelae of mild chronic microvascular ischemia. Images personally reviewed by Neurology: No structural findings suggestive of a left sided seizure focus are seen.  - EEG: Continuous slowing in the left cerebral hemisphere, maximal within the left temporal region. This study is suggestive of cortical dysfunction arising from the left hemisphere, likely secondary to underlying structural abnormality or post-ictal state. No seizures or epileptiform discharges were seen throughout the recording. - Overall symptoms and EEG findings are suggestive of possible intermittent subclinical seizure activity versus unwitnessed focal seizure arising in  the left hemisphere followed by postictal state  Recommendations: - Will obtain LTM EEG to assess for possible subclinical seizure versus focal left hemispheric spikes. If either of these findings are noted, then initiation of anticonvulsant treatment may be indicated.  - Continue to monitor.    Electronically signed: Dr. Kerney Elbe 02/28/2022, 10:24 PM

## 2022-02-28 NOTE — Procedures (Signed)
Patient Name: Chelsea [name]  MRN: [id number]  Epilepsy Attending: Lora Havens  Referring Physician/Provider: Edwin Dada, MD  Date: 02/28/2022 Duration: 23.10 mins  Patient history: 71yo F with ams. EEG to evaluate for seizure.  Level of alertness: Awake  AEDs during EEG study: None  Technical aspects: This EEG study was done with scalp electrodes positioned according to the 10-20 International system of electrode placement. Electrical activity was reviewed with band pass filter of 1-'70Hz'$ , sensitivity of 7 uV/mm, display speed of 62m/sec with a '60Hz'$  notched filter applied as appropriate. EEG data were recorded continuously and digitally stored.  Video monitoring was available and reviewed as appropriate.  Description: The posterior dominant rhythm consists of 8 Hz activity of moderate voltage (25-35 uV) seen predominantly in posterior head regions, symmetric and reactive to eye opening and eye closing. EEG showed continuous 3 to 6 Hz theta-delta slowing in left hemisphere, maximal left temporal region. Hyperventilation and photic stimulation were not performed.     ABNORMALITY - Continuous slow, left hemisphere, maximal left temporal region.  IMPRESSION: This study is suggestive of cortical dysfunction arising from left hemisphere, maximal left temporal region likely secondary to underlying structural abnormality, post-ictal state. No seizures or epileptiform discharges were seen throughout the recording.  Dr. DLoleta Bookswas notified.  Arnell Mausolf OBarbra Sarks

## 2022-03-01 ENCOUNTER — Observation Stay (HOSPITAL_COMMUNITY): Payer: Medicare Other

## 2022-03-01 DIAGNOSIS — I5032 Chronic diastolic (congestive) heart failure: Secondary | ICD-10-CM | POA: Diagnosis not present

## 2022-03-01 DIAGNOSIS — G40009 Localization-related (focal) (partial) idiopathic epilepsy and epileptic syndromes with seizures of localized onset, not intractable, without status epilepticus: Secondary | ICD-10-CM | POA: Diagnosis not present

## 2022-03-01 DIAGNOSIS — I10 Essential (primary) hypertension: Secondary | ICD-10-CM

## 2022-03-01 DIAGNOSIS — R4182 Altered mental status, unspecified: Secondary | ICD-10-CM | POA: Diagnosis not present

## 2022-03-01 DIAGNOSIS — J441 Chronic obstructive pulmonary disease with (acute) exacerbation: Secondary | ICD-10-CM | POA: Diagnosis not present

## 2022-03-01 DIAGNOSIS — E039 Hypothyroidism, unspecified: Secondary | ICD-10-CM

## 2022-03-01 DIAGNOSIS — N1831 Chronic kidney disease, stage 3a: Secondary | ICD-10-CM

## 2022-03-01 DIAGNOSIS — G9341 Metabolic encephalopathy: Secondary | ICD-10-CM | POA: Diagnosis not present

## 2022-03-01 LAB — BASIC METABOLIC PANEL
Anion gap: 10 (ref 5–15)
BUN: 19 mg/dL (ref 8–23)
CO2: 21 mmol/L — ABNORMAL LOW (ref 22–32)
Calcium: 8.9 mg/dL (ref 8.9–10.3)
Chloride: 107 mmol/L (ref 98–111)
Creatinine, Ser: 1.3 mg/dL — ABNORMAL HIGH (ref 0.44–1.00)
GFR, Estimated: 45 mL/min — ABNORMAL LOW (ref >60)
Glucose, Bld: 168 mg/dL — ABNORMAL HIGH (ref 70–99)
Potassium: 3.7 mmol/L (ref 3.5–5.1)
Sodium: 138 mmol/L (ref 135–145)

## 2022-03-01 LAB — CBC
HCT: 27.9 % — ABNORMAL LOW (ref 36.0–46.0)
Hemoglobin: 8.7 g/dL — ABNORMAL LOW (ref 12.0–15.0)
MCH: 24.4 pg — ABNORMAL LOW (ref 26.0–34.0)
MCHC: 31.2 g/dL (ref 30.0–36.0)
MCV: 78.4 fL — ABNORMAL LOW (ref 80.0–100.0)
Platelets: 265 10*3/uL (ref 150–400)
RBC: 3.56 MIL/uL — ABNORMAL LOW (ref 3.87–5.11)
RDW: 13.8 % (ref 11.5–15.5)
WBC: 6.2 10*3/uL (ref 4.0–10.5)
nRBC: 0 % (ref 0.0–0.2)

## 2022-03-01 LAB — BLOOD GAS, VENOUS
Acid-base deficit: 0.8 mmol/L (ref 0.0–2.0)
Bicarbonate: 23.5 mmol/L (ref 20.0–28.0)
Drawn by: 66167
O2 Saturation: 91.9 %
Patient temperature: 36.8
pCO2, Ven: 37 mmHg — ABNORMAL LOW (ref 44–60)
pH, Ven: 7.41 (ref 7.25–7.43)
pO2, Ven: 61 mmHg — ABNORMAL HIGH (ref 32–45)

## 2022-03-01 LAB — GLUCOSE, CAPILLARY
Glucose-Capillary: 177 mg/dL — ABNORMAL HIGH (ref 70–99)
Glucose-Capillary: 152 mg/dL — ABNORMAL HIGH (ref 70–99)
Glucose-Capillary: 218 mg/dL — ABNORMAL HIGH (ref 70–99)

## 2022-03-01 LAB — URINE CULTURE: Culture: 10000 — AB

## 2022-03-01 LAB — T4, FREE: Free T4: 2.83 ng/dL — ABNORMAL HIGH (ref 0.61–1.12)

## 2022-03-01 LAB — RPR
RPR Ser Ql: REACTIVE — AB
RPR Titer: 1:2 {titer}

## 2022-03-01 LAB — PHOSPHORUS: Phosphorus: 3.6 mg/dL (ref 2.5–4.6)

## 2022-03-01 LAB — MAGNESIUM: Magnesium: 1.6 mg/dL — ABNORMAL LOW (ref 1.7–2.4)

## 2022-03-01 MED ORDER — MAGNESIUM SULFATE 2 GM/50ML IV SOLN
2.0000 g | Freq: Once | INTRAVENOUS | Status: AC
  Administered 2022-03-01: 2 g via INTRAVENOUS
  Filled 2022-03-01: qty 50

## 2022-03-01 MED ORDER — CLONAZEPAM 0.5 MG PO TABS: 0.5000 mg | ORAL_TABLET | Freq: Every morning | ORAL | 0 refills | Status: DC

## 2022-03-01 MED ORDER — CLONAZEPAM 0.5 MG PO TABS
0.5000 mg | ORAL_TABLET | Freq: Every morning | ORAL | Status: DC
  Administered 2022-03-01: 0.5 mg via ORAL
  Filled 2022-03-01: qty 1

## 2022-03-01 MED ORDER — LEVOTHYROXINE SODIUM 100 MCG PO TABS: 100.0000 ug | ORAL_TABLET | Freq: Every day | ORAL | 3 refills | Status: AC

## 2022-03-01 NOTE — Plan of Care (Signed)
  Problem: Coping: Goal: Ability to adjust to condition or change in health will improve Outcome: Progressing   Problem: Nutritional: Goal: Maintenance of adequate nutrition will improve Outcome: Progressing   Problem: Skin Integrity: Goal: Risk for impaired skin integrity will decrease Outcome: Progressing

## 2022-03-01 NOTE — Progress Notes (Signed)
TRH night cross cover note:   I was notified by RN that patient's family is concerned over the patient's anxious appearance and absence of her several home central acting mental health meds, with RN also conveying that med rec has not been completed.  Per my chart review, on this patient who presents with altered mental status, conveyed that we are attempting to be conservative with resumption of potentially contributory central acting medications.  Given that the patient reportedly appears anxious, I did resume her clonazepam, but on a as needed basis only as well as her scheduled daily Geodon, but am refraining from resumption of any of her additional home medications at this time.      Babs Bertin, DO Hospitalist

## 2022-03-01 NOTE — Progress Notes (Signed)
LTM EEG discontinued - no skin breakdown at unhook.   

## 2022-03-01 NOTE — Progress Notes (Addendum)
Neurology Progress Note  Patient ID: Chelsea [name] is a 71 y.o. with PMHx of  has a past medical history of Anxiety, Arthritis, Bipolar disorder ([place]), COPD (chronic obstructive pulmonary disease) ([place]), Diabetes mellitus type II, Esophageal stricture, GERD (gastroesophageal reflux disease), Heart murmur, Hyperlipemia, Hypertension, Hypothyroidism, Mental disorder, Obesity, Oxygen desaturation during sleep, Schizophrenia ([place]), Shortness of breath, and Sleep apnea.   Initially consulted for: possible unwitnessed seizure   Major interval events/Subjective: - Improving - No acute complaints, eager to go home  MD confirmed key details of the history: NP spoke with patient and sister at the bedside to gather addition information regarding witnessed like seizure episode.  The patient was discharged home on 02/20/22 after being hospitalized and treated for COVID, discharged home on steroid taper.  No confusion noted on discharge at home, but the patient was refusing to do breathing treatments  at home per the patient's sister. On 02/27/22 the patient took her evening medications around 2000 (typically Klonopin '1mg'$ , Geodon '80mg'$ , trazadone '100mg'$  but she missed the Klonopin for unclear reasons) and laid in the recliner where she sleeps. The patient's sister heard commotion from another room  around 2200 and found the patient on the floor, eye wide open, mumbling when trying to speak , arms shaking, not able to follow commands, and her leg bent behind her.  EMS was called. She did tell her sister "I'm going to have to go to the bathroom." The patient does remember EMS being in the home, the patient's sister states the patient did answer "yes" "no" to some questions asked by EMS.  No tongue bite, patient is incontinent at times loss of bowel and bladder unknown. The patient does have a history of seizures when she was young (~5/71 yrs old) following meningitis and was on AEDs for a time (unsure but thinks dilantin  was one of them). Apparently the patient developed Schizophrenic like behavior and the AEDs were discontinued. The patient is currently followed by pysch. No recent seizure activity. Home meds are administered in dose packs and labs are drawn regularly.     Current Facility-Administered Medications:    amLODipine (NORVASC) tablet 2.5 mg, 2.5 mg, Oral, Daily, Hall, Carole N, DO, 2.5 mg at 02/28/22 0908   clonazePAM (KLONOPIN) tablet 1 mg, 1 mg, Oral, QHS PRN, Howerter, Justin B, DO, 1 mg at 02/28/22 2119   enoxaparin (LOVENOX) injection 40 mg, 40 mg, Subcutaneous, Daily, Hall, Carole N, DO, 40 mg at 02/28/22 0940   insulin aspart (novoLOG) injection 0-9 Units, 0-9 Units, Subcutaneous, Q4H, Hall, Carole N, DO, 2 Units at 02/28/22 2215   levothyroxine (SYNTHROID) tablet 125 mcg, 125 mcg, Oral, QAC breakfast, Kayleen Memos, DO, 125 mcg at 02/28/22 2536   magnesium sulfate IVPB 2 g 50 mL, 2 g, Intravenous, Once, Danford, Suann Larry, MD   montelukast (SINGULAIR) tablet 10 mg, 10 mg, Oral, Daily, Hall, Carole N, DO, 10 mg at 02/28/22 0908   ziprasidone (GEODON) capsule 80 mg, 80 mg, Oral, QHS, Howerter, Justin B, DO, 80 mg at 02/28/22 2204   Exam: Current vital signs: BP (!) 176/71 (BP Location: Right Arm)   Pulse 65   Temp 98.2 F (36.8 C)   Resp 17   Ht '5\' 2"'$  (1.575 m)   Wt 89 kg   SpO2 99%   BMI 35.89 kg/m  Vital signs in last 24 hours: Temp:  [98.1 F (36.7 C)-99.1 F (37.3 C)] 98.2 F (36.8 C) (12/02 0644) Pulse Rate:  [60-78] 65 (  12/02 0644) Resp:  [14-25] 17 (12/02 0644) BP: (130-177)/(52-94) 176/71 (12/02 0644) SpO2:  [95 %-100 %] 99 % (12/02 0644)  Gen: In bed, comfortable  Resp: non-labored breathing, no grossly audible wheezing Cardiac: Perfusing extremities well  Abd: soft, nt  Neuro: MS: alert to self, place and sister at bedside. Month (nov) year (1983) CN: II. Visual fields intact, PERRL,  III., IV. VI. No ptosis, EOMI no nystagmus VI: intact to touch equal  bilaterally VII. No facial symmetry IX, X. No hypophonia or hoarseness XI. Symmetric  XII. Midline tongue extension   Motor: Moving all extremities. 5/5 bilateral upper and lower extremites, no pronator drift, slightly tremoring of the left upper extremity finger-to-nose testing Sensory:intact to touch   Pertinent Labs:  Basic Metabolic Panel: Recent Labs  Lab 02/28/22 0004 02/28/22 0604 02/28/22 1655 03/01/22 0341  NA 136  --  135 138  K 4.0  --  4.0 3.7  CL 107  --   --  107  CO2 19*  --   --  21*  GLUCOSE 87  --   --  168*  BUN 33*  --   --  19  CREATININE 1.62* 1.24*  --  1.30*  CALCIUM 9.0  --   --  8.9  MG  --   --   --  1.6*  PHOS  --   --   --  3.6    CBC: Recent Labs  Lab 02/28/22 0004 02/28/22 1444 02/28/22 1655 03/01/22 0341  WBC 8.5 8.2  --  6.2  NEUTROABS 5.5  --   --   --   HGB 8.7* 9.0* 9.5* 8.7*  HCT 28.4* 28.4* 28.0* 27.9*  MCV 81.1 77.8*  --  78.4*  PLT 257 293  --  265    Coagulation Studies: Recent Labs    02/28/22 0004  LABPROT 13.3  INR 1.0     Lab Results  Component Value Date   TSH 0.216 (L) 02/28/2022  T4 increased 2.83 (ref range 0.61-1.12)  EEG  03/01/2022 0544 to 03/01/2022 0800  ABNORMALITY - Intermittent slow, left temporal region. IMPRESSION: This study is suggestive of non-specific cortical dysfunction arising from left temporal region. No seizures or epileptiform discharges were seen throughout the recording. EEG appears to be improving compared to previous day.   Impression: Event as described coupled with the EEG finding and significant seizures factors of prior seizures in the setting of meningitis highly concerning for a diagnosis of focal epilepsy.  Given her psychiatric history and that she is already on clonazepam which tolerating well, I favor increasing this medication gradually to a dose more effective for seizure control.  Given her age we will start with just adding 0.5 mg in the morning  Recommendations: -  UDS to confirm clonazepam adherence added on  - Discontinue LTM EEG - Add 0.5 mg morning dose of clonazepam for seizure control, continue 1 mg nightly - Appreciate management of comborbidities including increased t4 in the setting of known hypothyroidism on synthroid to primary team - Close outpatient follow-up with neurology, ambulatory referral to [facility] placed - Neurology will be available as needed going forward, please do not hesitate to reach out if symptoms additional questions or concerns arise - Please include seizure precautions in discharge instructions and review with patient and family  Standard seizure precautions: Per [facility] statutes, patients with seizures are not allowed to drive until  they have been seizure-free for six months. Use caution when  using heavy equipment or power tools. Avoid working on ladders or at heights. Take showers instead of baths. Ensure the water temperature is not too high on the home water heater. Do not go swimming alone. When caring for infants or small children, sit down when holding, feeding, or changing them to minimize risk of injury to the child in the event you have a seizure.  To reduce risk of seizures, maintain good sleep hygiene avoid alcohol and illicit drug use, take all anti-seizure medications as prescribed.    Lesleigh Noe MD-PhD [facility] [phone]

## 2022-03-01 NOTE — Care Management Obs Status (Signed)
[facility] NOTIFICATION   Patient Details  Name: NIALA STCHARLES MRN: [id number] Date of Birth: 01/21/1951   Medicare Observation Status Notification Given:  Yes    Carles Collet, RN 03/01/2022, 12:24 PM

## 2022-03-01 NOTE — TOC Transition Note (Signed)
Transition of Care [facility]) - CM/SW Discharge Note   Patient Details  Name: Chelsea [name] MRN: [id number] Date of Birth: 1950-12-05  Transition of Care [facility]) CM/SW Contact:  Carles Collet, RN Phone Number: 03/01/2022, 12:21 PM   Clinical Narrative:     [name] w patient at bedside.  She declined [place] services. Report she has all needed DME at home, [facility], and she has family that provide transportation home  Final next level of care: Home/Self Care Barriers to Discharge: No Barriers Identified   Patient Goals and CMS Choice        Discharge Placement                       Discharge Plan and Services                          HH Arranged: Refused [place]          Social Determinants of Health (SDOH) Interventions     Readmission Risk Interventions    03/20/2021    3:04 PM  Readmission Risk Prevention Plan  Transportation Screening Complete  Medication Review (RN Care Manager) Complete  [place] or [place] Complete  SW Recovery Care/Counseling Consult Complete  [place] Not Applicable

## 2022-03-01 NOTE — Discharge Summary (Signed)
Physician Discharge Summary   Patient: Chelsea [name] MRN: [id number] DOB: 09/20/1950  Admit date:     02/27/2022  Discharge date: 03/01/22  Discharge Physician: Edwin Dada   PCP: Darrol Jump, NP     Recommendations at discharge:  Follow up with Neurology in 2 weeks for new seizure Follow up with PCP Darrol Jump in 1 week Tomika Williams:  Please see TSH, fT4 and new dose levothyroxine --> Repeat TSH in 6 weeks Please follow up Treponeme test, methylmalonic acid, T3 and urine drug screen      Discharge Diagnoses: Principal Problem:   Acute metabolic encephalopathy due to seizure Active Problems:   Suspected partial seizure with post-ictal state   Essential hypertension   Type 2 diabetes mellitus without complication (HCC)   Morbid obesity (HCC)   COPD (chronic obstructive pulmonary disease) (HCC)   Hypothyroidism   OSA (obstructive sleep apnea)   HLD (hyperlipidemia)   Stage 3a chronic kidney disease (CKD) ([place])- Baseline creatinine 1.3-1.5   Chronic diastolic CHF (congestive heart failure) [facility])     Hospital Course: Chelsea [name] is a 71 y.o. F with COPD, obesity, OSA on BiPAP, HTN, DM, hypothyroidism, anxiety, dCHF and recent COVID with COPD flare who presented with few days confusion and then fall.    In the ER, the patient remained confused.  MRI brain showed no stroke.      * Confusion due to suspected post-ictal state due to suspected seizure Patient admitted and remained confused despite fluids and holding psychotropic medications.  MRI brain normal but EEG showed unilateral temporal slowing, consistent with a post-ictal state.  Neurology were consulted and she was observed on continuous EEG overnight.  This EEG showed improvement in the lateralized slowing, consistent with her gradual improvement in mentation.    B12, ammonia normal.  No focal evidence of infection.  RPR minimally elevated, suspect this is clinically insignificant.     Given the description of the event, the fact that she had seizures and was on AEDs as a young person, and that she had the abnormal EEG obtained, there was a high degree of suspicion for stroke.  Neurology titrated up her clonazepam and recommended close follow up with Neurology outpatient.     Positive RPR Suspect clinically insignificant.  Treponeme Ab test sent for confirmation.  PCP to follow up.   Chronic diastolic CHF (congestive heart failure) (HCC) Appeared euvolemic.  Stage 3a chronic kidney disease (CKD) ([place])- Baseline creatinine 1.3-1.5 Stable relative to baseline   COPD (chronic obstructive pulmonary disease) (HCC) OSA (obstructive sleep apnea) Blood gas normal.   Morbid obesity (HCC) BMI 35.9 in the setting of hypertension, sleep apnea, diabetes  Type 2 diabetes mellitus without complication ([place])    Essential hypertension              The [facility] Controlled Substances Registry was reviewed for this patient prior to discharge.  Consultants: Neurology Procedures performed: Continuous EEG, MRI brain  Disposition: Home health Diet recommendation:  Discharge Diet Orders (From admission, onward)     Start     Ordered   03/01/22 0000  Diet - low sodium heart healthy        03/01/22 1353             DISCHARGE MEDICATION: Allergies as of 03/01/2022       Reactions   Darvocet [propoxyphene N-acetaminophen] Anaphylaxis   Can take plain tylenol   Fluoxetine Other (See Comments)   Hallucinations    Penicillins  Rash   Promethazine Hives        Medication List     STOP taking these medications    chlorpheniramine-HYDROcodone 10-8 MG/5ML Suer Commonly known as: TUSSIONEX   Lagevrio 200 MG Caps capsule Generic drug: molnupiravir EUA   predniSONE 10 MG tablet Commonly known as: DELTASONE       TAKE these medications    acetaminophen 325 MG tablet Commonly known as: TYLENOL Take 650 mg by mouth every 6 (six) hours as  needed for moderate pain.   amLODipine 2.5 MG tablet Commonly known as: NORVASC Take 2.5 mg by mouth daily.   bumetanide 1 MG tablet Commonly known as: BUMEX Take 1 tablet (1 mg total) by mouth daily.   clonazePAM 1 MG tablet Commonly known as: KLONOPIN Take 1 tablet (1 mg total) by mouth at bedtime. What changed: Another medication with the same name was added. Make sure you understand how and when to take each.   clonazePAM 0.5 MG tablet Commonly known as: KLONOPIN Take 1 tablet (0.5 mg total) by mouth in the morning. Start taking on: March 02, 2022 What changed: You were already taking a medication with the same name, and this prescription was added. Make sure you understand how and when to take each.   diclofenac sodium 1 % Gel Commonly known as: VOLTAREN Apply 2 g topically 4 (four) times daily as needed (pain).   Ensure Max Protein Liqd Take 330 mLs (11 oz total) by mouth 2 (two) times daily.   ferrous gluconate 324 MG tablet Commonly known as: FERGON Take 324 mg by mouth daily with breakfast.   fluticasone 50 MCG/ACT nasal spray Commonly known as: FLONASE Place 2 sprays into both nostrils daily.   fluticasone furoate-vilanterol 200-25 MCG/INH Aepb Commonly known as: BREO ELLIPTA Inhale 1 puff into the lungs every morning.   Levemir 100 UNIT/ML injection Generic drug: insulin detemir Inject 30 Units into the skin at bedtime.   levothyroxine 100 MCG tablet Commonly known as: SYNTHROID Take 1 tablet (100 mcg total) by mouth daily before breakfast. What changed:  medication strength how much to take   metFORMIN 500 MG tablet Commonly known as: GLUCOPHAGE Take 500 mg by mouth in the morning and at bedtime.   metoprolol succinate 100 MG 24 hr tablet Commonly known as: TOPROL-XL Take 100 mg by mouth in the morning. Take with or immediately following a meal.   metoprolol tartrate 25 MG tablet Commonly known as: LOPRESSOR Take 0.5 tablets (12.5 mg total)  by mouth 2 (two) times daily.   montelukast 10 MG tablet Commonly known as: SINGULAIR Take 10 mg by mouth daily.   multivitamin with minerals Tabs tablet Take 1 tablet by mouth daily.   NovoLOG FlexPen 100 UNIT/ML FlexPen Generic drug: insulin aspart Inject 2-12 Units into the skin 3 (three) times daily with meals. Sliding scale   nystatin ointment Commonly known as: MYCOSTATIN Apply 1 application  topically 2 (two) times daily as needed (for rashes).   omeprazole 40 MG capsule Commonly known as: PRILOSEC Take 40 mg by mouth daily before breakfast.   ondansetron 4 MG tablet Commonly known as: ZOFRAN Take 1 tablet (4 mg total) by mouth every 6 (six) hours as needed for nausea.   Ozempic (2 MG/DOSE) 8 MG/3ML Sopn Generic drug: Semaglutide (2 MG/DOSE) Inject 2 mg into the skin every Friday.   PARoxetine 40 MG tablet Commonly known as: PAXIL Take 1 tablet (40 mg total) by mouth every morning.   polyethylene glycol  17 g packet Commonly known as: MIRALAX / GLYCOLAX Take 17 g by mouth daily as needed for mild constipation.   PRESCRIPTION MEDICATION BiPAP- At bedtime   rOPINIRole 0.25 MG tablet Commonly known as: REQUIP TAKE 3 TABLETS BY MOUTH AT BEDTIME   rosuvastatin 10 MG tablet Commonly known as: CRESTOR Take 10 mg by mouth at bedtime.   traZODone 100 MG tablet Commonly known as: DESYREL Take 1 tablet (100 mg total) by mouth at bedtime.   TRUEplus Insulin Syringe 29G X 1/2" 1 ML Misc Generic drug: INSULIN SYRINGE 1CC/29G 1 each by Other route daily.   Ventolin HFA 108 (90 Base) MCG/ACT inhaler Generic drug: albuterol Inhale 1-2 puffs into the lungs every 4 (four) hours as needed for wheezing or shortness of breath.   albuterol (2.5 MG/3ML) 0.083% nebulizer solution Commonly known as: PROVENTIL Take 3 mLs (2.5 mg total) by nebulization every 6 (six) hours as needed for wheezing or shortness of breath.   Vitamin D3 125 MCG (5000 UT) Tabs Take 5,000 Units by  mouth daily.   ziprasidone 80 MG capsule Commonly known as: GEODON Take 1 capsule (80 mg total) by mouth at bedtime.        Follow-up Information     Darrol Jump, NP. Schedule an appointment as soon as possible for a visit in 1 week(s).   Specialty: Nurse Practitioner Contact information: [place] [address] [phone]         Alric Ran, MD Follow up.   Specialty: Neurology Why: Referral has been sent, please call if you haven't heard from them in 1 week Contact information: [place] [address] [phone]                 Discharge Instructions     Ambulatory referral to Neurology   Complete by: As directed    An appointment is requested in approximately: 4 weeks   Diet - low sodium heart healthy   Complete by: As directed    Discharge instructions   Complete by: As directed    **IMPORTANT DISCHARGE INSTRUCTIONS**   From Dr. Loleta Books: YOu were admitted for a fall and confusion We are concerned that you had seizures THe fact that you had seizures when you were younger, the way that your episode (when you fell and were brought to the hospital) combined with your abnormal EEG here are not 100% conclusive but are very suspicious for focal seizures.  We recommend you continue your clonazepam 1 mg nightly ADD the new clonazepam 0.5 mg each morning in addition  Go see a Neurologist, Dr. April Manson, at [facility] We have sent this referral, see below  In addition, make sure you are not taking prednisone or Tussionex anymore  Resume your other home medicines   IMPORTANT:  This was an incidental finding, but your thyroid level was abnormal. I recommend you reduce your thyroid medicine to 100 mcg daily, and follow up with your levothyroxine prescriber as soon as possible   Increase activity slowly   Complete by: As directed        Discharge Exam: Filed Weights   02/28/22 0000  Weight: 89 kg     General: Pt is alert, awake, not in acute distress Cardiovascular: RRR, nl S1-S2, no murmurs appreciated.   No LE edema.   Respiratory: Normal respiratory rate and rhythm.  CTAB without rales or wheezes. Abdominal: Abdomen soft and non-tender.  No distension or HSM.   Neuro/Psych: Strength  symmetric in upper and lower extremities.  Judgment and insight appear normal.   Condition at discharge: stable  The results of significant diagnostics from this hospitalization (including imaging, microbiology, ancillary and laboratory) are listed below for reference.   Imaging Studies: Overnight EEG with video  Result Date: 03/01/2022 Chelsea Havens, MD     03/01/2022  7:57 AM Patient Name: Chelsea [name] MRN: [id number] Epilepsy Attending: Lora [name] Referring Physician/Provider: Kerney Elbe, MD Duration: 03/01/2022 0544 to 03/01/2022 0800  Patient history: 71yo F with ams. EEG to evaluate for seizure.  Level of alertness: Awake  AEDs during EEG study: None  Technical aspects: This EEG study was done with scalp electrodes positioned according to the 10-20 International system of electrode placement. Electrical activity was reviewed with band pass filter of 1-'70Hz'$ , sensitivity of 7 uV/mm, display speed of 75m/sec with a '60Hz'$  notched filter applied as appropriate. EEG data were recorded continuously and digitally stored.  Video monitoring was available and reviewed as appropriate.  Description: The posterior dominant rhythm consists of 8 Hz activity of moderate voltage (25-35 uV) seen predominantly in posterior head regions, symmetric and reactive to eye opening and eye closing. EEG showed intermittent 3 to 6 Hz theta-delta slowing in left temporal region. Hyperventilation and photic stimulation were not performed.    ABNORMALITY - Intermittent slow, left temporal region.  IMPRESSION: This study is suggestive of non-specific cortical dysfunction arising from left temporal region. No seizures or  epileptiform discharges were seen throughout the recording. EEG appears to be improving compared to previous day.  PLora [name]  EEG adult  Result Date: 02/28/2022 YLora Havens MD     02/28/2022  4:09 PM Patient Name: Chelsea OBEIRNEMRN: [id number] Attending: PLora HavensReferring Physician/Provider: DEdwin Dada MD Date: 02/28/2022 Duration: 23.10 mins Patient history: 89yoF with ams. EEG to evaluate for seizure. Level of alertness: Awake AEDs during EEG study: None Technical aspects: This EEG study was done with scalp electrodes positioned according to the 10-20 International system of electrode placement. Electrical activity was reviewed with band pass filter of 1-'70Hz'$ , sensitivity of 7 uV/mm, display speed of 357msec with a '60Hz'$  notched filter applied as appropriate. EEG data were recorded continuously and digitally stored.  Video monitoring was available and reviewed as appropriate. Description: The posterior dominant rhythm consists of 8 Hz activity of moderate voltage (25-35 uV) seen predominantly in posterior head regions, symmetric and reactive to eye opening and eye closing. EEG showed continuous 3 to 6 Hz theta-delta slowing in left hemisphere, maximal left temporal region. Hyperventilation and photic stimulation were not performed.   ABNORMALITY - Continuous slow, left hemisphere, maximal left temporal region. IMPRESSION: This study is suggestive of cortical dysfunction arising from left hemisphere, maximal left temporal region likely secondary to underlying structural abnormality, post-ictal state. No seizures or epileptiform discharges were seen throughout the recording. Dr. DaLoleta Booksas notified. PrLora [name] MR BRAIN WO CONTRAST  Result Date: 02/28/2022 CLINICAL DATA:  Altered mental status EXAM: MRI HEAD WITHOUT CONTRAST TECHNIQUE: Multiplanar, multiecho pulse sequences of the brain and surrounding structures were obtained without intravenous contrast.  COMPARISON:  Same-day CT head. FINDINGS: Brain: No acute infarction, hemorrhage, hydrocephalus, extra-axial collection or mass lesion. Sequela of mild chronic microvascular ischemic change. Vascular: Normal flow voids. Skull and upper cervical spine: Normal marrow signal. Sinuses/Orbits: There is mucosal thickening in the right maxillary and ethmoid sinus. Other: None. IMPRESSION: No acute intracranial abnormality. Electronically Signed  By: Marin Roberts M.D.   On: 02/28/2022 08:41   CT HEAD WO CONTRAST (5MM)  Result Date: 02/28/2022 CLINICAL DATA:  Head trauma EXAM: CT HEAD WITHOUT CONTRAST CT CERVICAL SPINE WITHOUT CONTRAST TECHNIQUE: Multidetector CT imaging of the head and cervical spine was performed following the standard protocol without intravenous contrast. Multiplanar CT image reconstructions of the cervical spine were also generated. RADIATION DOSE REDUCTION: This exam was performed according to the departmental dose-optimization program which includes automated exposure control, adjustment of the mA and/or kV according to patient size and/or use of iterative reconstruction technique. COMPARISON:  None Available. FINDINGS: CT HEAD FINDINGS Brain: There is no mass, hemorrhage or extra-axial collection. The size and configuration of the ventricles and extra-axial CSF spaces are normal. The brain parenchyma is normal, without evidence of acute or chronic infarction. Vascular: No abnormal hyperdensity of the major intracranial arteries or dural venous sinuses. No intracranial atherosclerosis. Skull: The visualized skull base, calvarium and extracranial soft tissues are normal. Sinuses/Orbits: No fluid levels or advanced mucosal thickening of the visualized paranasal sinuses. No mastoid or middle ear effusion. The orbits are normal. CT CERVICAL SPINE FINDINGS Alignment: No static subluxation. Facets are aligned. Occipital condyles are normally positioned. Skull base and vertebrae: No acute fracture. Soft  tissues and spinal canal: No prevertebral fluid or swelling. No visible canal hematoma. Disc levels: Multilevel facet arthrosis Upper chest: No pneumothorax, pulmonary nodule or pleural effusion. Other: Normal visualized paraspinal cervical soft tissues. IMPRESSION: 1. No acute intracranial abnormality. 2. No acute fracture or static subluxation of the cervical spine. Electronically Signed   By: Ulyses Jarred M.D.   On: 02/28/2022 03:35   CT CERVICAL SPINE WO CONTRAST  Result Date: 02/28/2022 CLINICAL DATA:  Head trauma EXAM: CT HEAD WITHOUT CONTRAST CT CERVICAL SPINE WITHOUT CONTRAST TECHNIQUE: Multidetector CT imaging of the head and cervical spine was performed following the standard protocol without intravenous contrast. Multiplanar CT image reconstructions of the cervical spine were also generated. RADIATION DOSE REDUCTION: This exam was performed according to the departmental dose-optimization program which includes automated exposure control, adjustment of the mA and/or kV according to patient size and/or use of iterative reconstruction technique. COMPARISON:  None Available. FINDINGS: CT HEAD FINDINGS Brain: There is no mass, hemorrhage or extra-axial collection. The size and configuration of the ventricles and extra-axial CSF spaces are normal. The brain parenchyma is normal, without evidence of acute or chronic infarction. Vascular: No abnormal hyperdensity of the major intracranial arteries or dural venous sinuses. No intracranial atherosclerosis. Skull: The visualized skull base, calvarium and extracranial soft tissues are normal. Sinuses/Orbits: No fluid levels or advanced mucosal thickening of the visualized paranasal sinuses. No mastoid or middle ear effusion. The orbits are normal. CT CERVICAL SPINE FINDINGS Alignment: No static subluxation. Facets are aligned. Occipital condyles are normally positioned. Skull base and vertebrae: No acute fracture. Soft tissues and spinal canal: No prevertebral  fluid or swelling. No visible canal hematoma. Disc levels: Multilevel facet arthrosis Upper chest: No pneumothorax, pulmonary nodule or pleural effusion. Other: Normal visualized paraspinal cervical soft tissues. IMPRESSION: 1. No acute intracranial abnormality. 2. No acute fracture or static subluxation of the cervical spine. Electronically Signed   By: Ulyses Jarred M.D.   On: 02/28/2022 03:35   DG Chest Port 1 View  Result Date: 02/28/2022 CLINICAL DATA:  Possible sepsis EXAM: PORTABLE CHEST 1 VIEW COMPARISON:  02/19/2022 FINDINGS: Cardiac shadow is within normal limits. Aortic calcifications are noted. Lungs are clear bilaterally. No bony abnormality is seen.  IMPRESSION: No active disease. Electronically Signed   By: Inez Catalina M.D.   On: 02/28/2022 00:46   DG Chest Port 1V same Day  Result Date: 02/19/2022 CLINICAL DATA:  Shortness of breath EXAM: PORTABLE CHEST 1 VIEW COMPARISON:  CXR 02/18/22 FINDINGS: No pleural effusion. No pneumothorax. Unchanged cardiac and mediastinal contours. No focal airspace opacity. No displaced rib fractures. Visualized upper abdomen is unremarkable. Mild aortic atherosclerotic calcifications. IMPRESSION: No radiographic finding to explain shortness of breath Electronically Signed   By: Marin Roberts M.D.   On: 02/19/2022 07:53   DG Chest Portable 1 View  Result Date: 02/18/2022 CLINICAL DATA:  Shortness of breath EXAM: PORTABLE CHEST 1 VIEW COMPARISON:  Chest x-ray 03/19/2021 FINDINGS: The heart size and mediastinal contours are within normal limits. Both lungs are clear. The visualized skeletal structures are unremarkable. IMPRESSION: No active disease. Electronically Signed   By: Ronney Asters M.D.   On: 02/18/2022 17:07    Microbiology: Results for orders placed or performed during the hospital encounter of 02/27/22  Urine Culture     Status: Abnormal   Collection Time: 02/28/22 12:34 AM   Specimen: In/Out Cath Urine  Result Value Ref Range Status    Specimen Description IN/OUT CATH URINE  Final   Special Requests NONE  Final   Culture (A)  Final    10,000 COLONIES/mL DIPHTHEROIDS(CORYNEBACTERIUM SPECIES) Standardized susceptibility testing for this organism is not available. Performed at [facility], [place] [address] [place], [place] [address]    Report Status 03/01/2022 FINAL  Final  Blood Culture (routine x 2)     Status: None (Preliminary result)   Collection Time: 02/28/22 12:47 AM   Specimen: BLOOD  Result Value Ref Range Status   Specimen Description BLOOD LEFT ANTECUBITAL  Final   Special Requests   Final    BOTTLES DRAWN AEROBIC AND ANAEROBIC Blood Culture adequate volume   Culture   Final    NO GROWTH 1 DAY Performed at [facility], [place] [address] [place], [place] [address]    Report Status PENDING  Incomplete  Blood Culture (routine x 2)     Status: None (Preliminary result)   Collection Time: 02/28/22 12:47 AM   Specimen: BLOOD RIGHT FOREARM  Result Value Ref Range Status   Specimen Description BLOOD RIGHT FOREARM  Final   Special Requests   Final    BOTTLES DRAWN AEROBIC AND ANAEROBIC Blood Culture adequate volume   Culture   Final    NO GROWTH 1 DAY Performed at [facility], [place] [address] [place], [place] [address]    Report Status PENDING  Incomplete  Respiratory (~20 pathogens) panel by PCR     Status: None   Collection Time: 02/28/22  2:44 PM   Specimen: Nasopharyngeal Swab; Respiratory  Result Value Ref Range Status   Adenovirus NOT DETECTED NOT DETECTED Final   Coronavirus 229E NOT DETECTED NOT DETECTED Final    Comment: (NOTE) The Coronavirus on the Respiratory Panel, DOES NOT test for the novel  Coronavirus (2019 nCoV)    Coronavirus HKU1 NOT DETECTED NOT DETECTED Final   Coronavirus NL63 NOT DETECTED NOT DETECTED Final   Coronavirus OC43 NOT DETECTED NOT DETECTED Final   Metapneumovirus NOT DETECTED NOT DETECTED Final   Rhinovirus / Enterovirus NOT DETECTED NOT DETECTED  Final   Influenza A NOT DETECTED NOT DETECTED Final   Influenza B NOT DETECTED NOT DETECTED Final   Parainfluenza Virus 1 NOT DETECTED NOT DETECTED  Final   Parainfluenza Virus 2 NOT DETECTED NOT DETECTED Final   Parainfluenza Virus 3 NOT DETECTED NOT DETECTED Final   Parainfluenza Virus 4 NOT DETECTED NOT DETECTED Final   Respiratory Syncytial Virus NOT DETECTED NOT DETECTED Final   Bordetella pertussis NOT DETECTED NOT DETECTED Final   Bordetella Parapertussis NOT DETECTED NOT DETECTED Final   Chlamydophila pneumoniae NOT DETECTED NOT DETECTED Final   Mycoplasma pneumoniae NOT DETECTED NOT DETECTED Final    Comment: Performed at [facility], [place] [address], [place], [place] [address]    Labs: CBC: Recent Labs  Lab 02/28/22 0004 02/28/22 1444 02/28/22 1655 03/01/22 0341  WBC 8.5 8.2  --  6.2  NEUTROABS 5.5  --   --   --   HGB 8.7* 9.0* 9.5* 8.7*  HCT 28.4* 28.4* 28.0* 27.9*  MCV 81.1 77.8*  --  78.4*  PLT 257 293  --  672   Basic Metabolic Panel: Recent Labs  Lab 02/28/22 0004 02/28/22 0604 02/28/22 1655 03/01/22 0341  NA 136  --  135 138  K 4.0  --  4.0 3.7  CL 107  --   --  107  CO2 19*  --   --  21*  GLUCOSE 87  --   --  168*  BUN 33*  --   --  19  CREATININE 1.62* 1.24*  --  1.30*  CALCIUM 9.0  --   --  8.9  MG  --   --   --  1.6*  PHOS  --   --   --  3.6   Liver Function Tests: Recent Labs  Lab 02/28/22 0004  AST 29  ALT 20  ALKPHOS 84  BILITOT 0.6  PROT 5.3*  ALBUMIN 2.6*   CBG: Recent Labs  Lab 02/28/22 1434 02/28/22 2211 03/01/22 0117 03/01/22 0756 03/01/22 1203  GLUCAP 180* 178* 177* 152* 218*    Discharge time spent: approximately 35 minutes spent on discharge counseling, evaluation of patient on day of discharge, and coordination of discharge planning with nursing, social work, pharmacy and case management  Signed: Edwin Dada, MD [facility] 03/01/2022

## 2022-03-01 NOTE — Progress Notes (Signed)
MB performed maintenance on electrodes. All impedances are below 10k ohms. No skin breakdown noted.  

## 2022-03-01 NOTE — Procedures (Signed)
Patient Name: Chelsea [name]  MRN: [id number]  Epilepsy Attending: Lora Havens  Referring Physician/Provider: Kerney Elbe, MD  Duration: 03/01/2022 0544 to 03/01/2022 0800   Patient history: 71yo F with ams. EEG to evaluate for seizure.   Level of alertness: Awake   AEDs during EEG study: None   Technical aspects: This EEG study was done with scalp electrodes positioned according to the 10-20 International system of electrode placement. Electrical activity was reviewed with band pass filter of 1-'70Hz'$ , sensitivity of 7 uV/mm, display speed of 48m/sec with a '60Hz'$  notched filter applied as appropriate. EEG data were recorded continuously and digitally stored.  Video monitoring was available and reviewed as appropriate.   Description: The posterior dominant rhythm consists of 8 Hz activity of moderate voltage (25-35 uV) seen predominantly in posterior head regions, symmetric and reactive to eye opening and eye closing. EEG showed intermittent 3 to 6 Hz theta-delta slowing in left temporal region. Hyperventilation and photic stimulation were not performed.      ABNORMALITY - Intermittent slow, left temporal region.   IMPRESSION: This study is suggestive of non-specific cortical dysfunction arising from left temporal region. No seizures or epileptiform discharges were seen throughout the recording.  EEG appears to be improving compared to previous day.    [name] OBarbra Sarks

## 2022-03-01 NOTE — Progress Notes (Signed)
Patient was discharged left unit at 1345 in no obvious distress.

## 2022-03-01 NOTE — Progress Notes (Signed)
LTM EEG hooked up and running - no initial skin breakdown - push button tested - [facility] monitoring.  

## 2022-03-02 LAB — T3: T3, Total: >651 ng/dL — ABNORMAL HIGH (ref 71–180)

## 2022-03-03 LAB — T.PALLIDUM AB, TOTAL: T Pallidum Abs: NONREACTIVE

## 2022-03-05 LAB — CULTURE, BLOOD (ROUTINE X 2)
Culture: NO GROWTH
Culture: NO GROWTH
Special Requests: ADEQUATE
Special Requests: ADEQUATE

## 2022-03-05 LAB — METHYLMALONIC ACID, SERUM: Methylmalonic Acid, Quantitative: 155 nmol/L (ref 0–378)

## 2022-03-10 ENCOUNTER — Encounter (HOSPITAL_COMMUNITY): Payer: Self-pay | Admitting: Psychiatry

## 2022-03-10 ENCOUNTER — Telehealth (HOSPITAL_BASED_OUTPATIENT_CLINIC_OR_DEPARTMENT_OTHER): Payer: Medicare Other | Admitting: Psychiatry

## 2022-03-10 VITALS — Wt 196.0 lb

## 2022-03-10 DIAGNOSIS — F411 Generalized anxiety disorder: Secondary | ICD-10-CM | POA: Diagnosis not present

## 2022-03-10 DIAGNOSIS — F200 Paranoid schizophrenia: Secondary | ICD-10-CM

## 2022-03-10 DIAGNOSIS — R413 Other amnesia: Secondary | ICD-10-CM | POA: Diagnosis not present

## 2022-03-10 MED ORDER — ZIPRASIDONE HCL 80 MG PO CAPS: 80.0000 mg | ORAL_CAPSULE | Freq: Every day | ORAL | 0 refills | Status: DC

## 2022-03-10 MED ORDER — TRAZODONE HCL 100 MG PO TABS: 100.0000 mg | ORAL_TABLET | Freq: Every day | ORAL | 0 refills | Status: DC

## 2022-03-10 MED ORDER — PAROXETINE HCL 40 MG PO TABS: 40.0000 mg | ORAL_TABLET | Freq: Every morning | ORAL | 0 refills | Status: DC

## 2022-03-10 MED ORDER — CLONAZEPAM 1 MG PO TABS: 1.0000 mg | ORAL_TABLET | Freq: Every day | ORAL | 2 refills | Status: DC

## 2022-03-10 NOTE — Progress Notes (Signed)
Virtual Visit via Telephone Note  I connected with Chelsea [name] on 03/10/22 at 10:00 AM EST by telephone and verified that I am speaking with the correct person using two identifiers.  Location: Patient: Home Provider: Home office   I discussed the limitations, risks, security and privacy concerns of performing an evaluation and management service by telephone and the availability of in person appointments. I also discussed with the patient that there may be a patient responsible charge related to this service. The patient expressed understanding and agreed to proceed.   History of Present Illness: Patient is evaluated by phone session.  She was recently admitted due to confusion.  She had workup and stroke was a rule out.  Patient told that she had missed the dose of the Klonopin 1 night and the next day she was confused.  Apparently she was also worked out for seizures and now schedule to see neurology on December 18.  She admitted there are times when she is forgetful and her memory is slowly declining.  She has difficulty remembering things.  In the hospital 0.5 mg Klonopin was added in the morning and keep the Klonopin 1 mg at bedtime.  Patient told she had a EEG but results are not available.  She reported her paranoia, hallucinations are stable since taking the Geodon regularly.  She denies any mood swings, anger, suicidal thoughts.  She lives with her sister who is very cooperative.  She is using CPAP which is helping her sleep.  She goes outside some time with her sister at the grocery store.  She reported no major concern or side effects from the medication.  Patient denies any panic attack.  She is seeing PCP from [place] and reported their provider comes to home to see the patient.  Her next appointment is next Wednesday.   Past Psychiatric History: H/O inpatient at [facility] and [facility] for mania and psychosis.  No h/o suicidal attempt.    Psychiatric Specialty  Exam: Physical Exam  Review of Systems  Weight 196 lb (88.9 kg).Body mass index is 35.85 kg/m.  General Appearance: NA  Eye Contact:  NA  Speech:  Slow  Volume:  Decreased  Mood:  Euthymic  Affect:  NA  Thought Process:  Descriptions of Associations: Intact  Orientation:  Full (Time, Place, and Person)  Thought Content:   Poverty of thought content but no paranoia or delusions  Suicidal Thoughts:  No  Homicidal Thoughts:  No  Memory:  Immediate;   Fair Recent;   Fair Remote;   Fair  Judgement:  Fair  Insight:  Shallow  Psychomotor Activity:  Decreased  Concentration:  Concentration: Fair and Attention Span: Fair  Recall:  [employer] of Knowledge:  Fair  Language:  Fair  Akathisia:  No  Handed:  Right  AIMS (if indicated):     Assets:  Communication Skills Desire for Improvement Housing Social Support  ADL's:  Impaired  Cognition:  Impaired,  Mild  Sleep:   using CPAP, ok      Assessment and Plan: Schizophrenia chronic paranoid type.  Generalized anxiety disorder.  Memory deficit  I reviewed blood work results, hospitalization notes and current medication.  She is now taking extra Klonopin 0.5 mg in the morning.  I discussed benzodiazepine dependence tolerance especially worsening of memory and attention.  I recommend try to avoid taking 0.5 mg in the morning and keep the appointment with neurology as patient scheduled a week from now.  She was admitted due to change mental status and she had an EEG but results are not available.  She also have memory issues and I recommend discussed with a neurology for her memory impairment.  She claimed missing the dose of Klonopin caused confusion. I recommend to stay Klonopin 1 mg at bedtime and discuss with the neurology if she need seizure medicine.  Taking extra Klonopin may cause worsening of memory and concentration issues.  Her last hemoglobin A1c was also increased from the past.  I recommend should discussed with the PCP on her  next appointment who is coming next Wednesday.  Continue Paxil 40 mg daily, Geodon 80 mg at bedtime and we will keep the Klonopin 1 mg at bedtime.  Patient also taking multiple medication and we discussed polypharmacy.  Recommend to call us back if she has any question or any concern.  Follow-up in 3 months.  I will forward my note to neurology.  Follow Up Instructions:    I discussed the assessment and treatment plan with the patient. The patient was provided an opportunity to ask questions and all were answered. The patient agreed with the plan and demonstrated an understanding of the instructions.   The patient was advised to call back or seek an in-person evaluation if the symptoms worsen or if the condition fails to improve as anticipated.  Collaboration of Care: Other provider involved in patient's care AEB notes are available in epic to review.  Patient/Guardian was advised Release of Information must be obtained prior to any record release in order to collaborate their care with an outside provider. Patient/Guardian was advised if they have not already done so to contact the registration department to sign all necessary forms in order for [place] to release information regarding their care.   Consent: Patient/Guardian gives verbal consent for treatment and assignment of benefits for services provided during this visit. Patient/Guardian expressed understanding and agreed to proceed.    I provided 25 minutes of non-face-to-face time during this encounter.   Kathlee Nations, MD

## 2022-03-11 ENCOUNTER — Telehealth: Payer: Self-pay | Admitting: *Deleted

## 2022-03-11 NOTE — Telephone Encounter (Signed)
Received this message from [facility] NP:  Ward Givens, NP  [facility] Calls She has an appt 12/18 with me for FU but has an appt 1/2 with [facility] to address new issue of seizures. She should keep apt with [facility]. If Rexene Alberts has something sooner can bump her up. Cancel appt with me.  -I called the pt's sister and discussed. Appt for 12/18 was canceled with [facility] NP due to not being needed right now. I was able to move up the appt with Dr Rexene Alberts to 03/18/22 at 845 arrival 815 am. She was very Patent attorney. She also mentioned that the patient is now on Bipap under the care of pulmonology. Regarding the seizures, she said the patient has a h/o of seizures as a child. She is currently on several "powerful" psychiatric medications and the patient said she may have missed one of them. Also was recently given antiviral medication for covid. Questions what caused the seizure? Happy to be seen earlier.

## 2022-03-17 ENCOUNTER — Ambulatory Visit: Payer: Medicare Other | Admitting: Adult Health

## 2022-03-18 ENCOUNTER — Encounter: Payer: Self-pay | Admitting: Neurology

## 2022-03-18 ENCOUNTER — Ambulatory Visit (INDEPENDENT_AMBULATORY_CARE_PROVIDER_SITE_OTHER): Payer: 59 | Admitting: Neurology

## 2022-03-18 VITALS — BP 152/66 | HR 64 | Ht 62.0 in | Wt 191.2 lb

## 2022-03-18 DIAGNOSIS — R29818 Other symptoms and signs involving the nervous system: Secondary | ICD-10-CM

## 2022-03-18 DIAGNOSIS — Z8669 Personal history of other diseases of the nervous system and sense organs: Secondary | ICD-10-CM | POA: Diagnosis not present

## 2022-03-18 DIAGNOSIS — R4182 Altered mental status, unspecified: Secondary | ICD-10-CM

## 2022-03-18 NOTE — Patient Instructions (Addendum)
It was nice to see you both again. I am glad to hear, that you feel back to baseline.   Given that you had seizures as a child and encephalopathy as a child, your seizure risk is higher than the general population, it is certainly possible that you had a seizure event back in November.  Since it was a single or isolated event, I currently feel reluctant to add yet another medication to your regimen.   I do agree with your psychiatrist that we should not use clonazepam as seizure medication for you.  You can continue to take the dose that you are prescribed by your psychiatrist as far as the clonazepam.  We may consider seizure medication down the road for you.   We can also consider repeating an EEG but I do not think it is necessary currently and will not really change our management.  Please remember, common seizure triggers are: Sleep deprivation, dehydration, overheating, stress, hypoglycemia or skipping meals, certain medications or excessive alcohol use, especially stopping alcohol abruptly if you have had heavy alcohol use before (aka alcohol withdrawal seizure). If you have a prolonged seizure over 2-5 minutes or back to back seizures, call or have someone call 911 or take you to the nearest emergency room. You cannot drive a car or operate any other machinery or vehicle within 6 months of a seizure. Please do not swim alone or take a tub bath for safety. Do not climb on ladders or be at heights alone. Do not cook with large quantities of boiling water or oil for safety. Please ensure the water temperature at home is not too high. When carrying or caring for small children and infants, make sure you sit down when holding the child are feeding the child or changing them to minimize risk for injury to the child are to you if you were to have a seizure.  Take your medicine for seizure prevention regularly and do not skip doses or stop medication abruptly and tone are told to do so by your healthcare  provider. Try to get a refill on your antiepileptic medication ahead of time, so you are not at risk of running out. If you run out of the seizure medication and do not have a refill at hand she may run into medication withdrawal seizures. Avoid taking Wellbutrin, narcotic pain medications and tramadol, as they can lower seizure threshold.  As per [facility] statutes, patients with seizures and epilepsy are not allowed to drive until they have been seizure-free for at least 6 months. This also applies to driving or using heavy equipment or power tools.  Please follow-up to see Vaughan Browner, NP routinely in 6 months.  I think you have room to hydrate better with water, 6 to 8 cups of water per day are recommended, 8 ounce size each.

## 2022-03-18 NOTE — Progress Notes (Signed)
Subjective:    Patient ID: Chelsea [name] is a 71 y.o. female.  HPI    Star Age, MD, PhD [facility] [address].O. [place], [place] [address]  Chelsea [name] is a 71 year old right-handed woman with a complex medical history of hypothyroidism, hypertension, type 2 diabetes, hyperlipidemia, morbid obesity, mood disorder, (incl. schizophrenia for which she is followed by mental health), Vitamin D deficiency, allergic rhinitis, osteoarthritis, who presents for a new problem visit for concern for seizure.  I last saw her on 08/29/2021, at which time she was not using her CPAP machine very much.  She was unable to tolerate treatment and requested to discontinue AutoPap therapy.  She was advised to follow-up with her pulmonologist to see if she would qualify for nocturnal oxygen.  Today, 03/18/2022: She reports feeling better, she feels back to baseline.  Sister reports that she has been in her usual state of health, her psychiatrist was not in favor of adding a second dose of clonazepam in the morning and they have just been utilizing the evening dose of clonazepam at the usual dose.  They have stopped the additional 0.5 mg of clonazepam in the morning.  She has not had any recurrent seizure events or seizure-like changes, no one-sided weakness or numbness or tingling or droopy face or slurring of speech.  Event at the end of November was overnight, patient was in her room sleeping in the recliner and they heard a noise, she was on the floor, she had toppled the chair.  She was confused, her speech was difficult to understand, sister called EMS.    She was recently hospitalized from 02/27/2022 through 03/01/2022 for concern for seizure.  The patient presented to the emergency room with confusion and a fall.  I reviewed the hospital records.  Blood work showed normal TSH, RPR reactive.  Methylmalonic acid 155, in the normal range, vitamin B12 elevated at 1901,  ammonia level less than 10.  She had an EEG on 02/28/2022 which showed: IMPRESSION: This study is suggestive of cortical dysfunction arising from left hemisphere, maximal left temporal region likely secondary to underlying structural abnormality, post-ictal state. No seizures or epileptiform discharges were seen throughout the recording.  She was placed on long-term EEG monitoring.  Evaluation on 03/01/2022 showed:  IMPRESSION: This study is suggestive of non-specific cortical dysfunction arising from left temporal region. No seizures or epileptiform discharges were seen throughout the recording.   EEG appears to be improving compared to previous day.   She had a brain MRI without contrast on 02/28/2022 and I reviewed the results: Impression: No acute intracranial abnormality.  She had a head CT without contrast and cervical spine CT without contrast on 02/28/2022 and I reviewed the results: Impression: No acute intracranial abnormality.  No acute fracture or static subluxation of the cervical spine.  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  She has not had any seizures since childhood, she was on Dilantin in the past which caused major side effects including her losing all her teeth as I understand. Patient does not hydrate very well, she does not like to drink water, she drinks tea, she likes to add artificial sweetener. She uses a BiPAP at night. She does not take any Wellbutrin.  Previously:   I saw her on 04/27/18, at which time she reported difficulty sleeping.  She had a prior diagnosis of sleep apnea.  Ropinirole for restless leg symptoms.  She saw Ward Givens in the interim on 11/09/2018 at which time she was maintained on ropinirole.  She saw Ward Givens, NP on 11/10/2019 for restless leg syndrome and was stable at the time.  She saw Ward Givens, NP on 12/04/2020 at which time she was maintained on ropinirole for her restless legs.  We pursued  a home sleep test for reevaluation of her OSA.  She had a home sleep test on 12/31/2020 which indicated severe obstructive sleep apnea with an AHI of 43.1/h, O2 nadir 77%.  Moderate snoring was detected for the most part.  She was advised to restart AutoPap therapy.  Her set up date was 04/02/2021.     I reviewed her compliance data for the past few months, between 04/02/2021 through 06/30/2021 she used her machine 67 out of 90 days with percent use days greater than 4 hours at 1.1% only, indicating very low compliance with an average usage for days on treatment of only 22 minutes.     I saw her on 07/22/2016, at which time he was doing well on Requip low-dose. She saw Ward Givens, NP, in the interim 11/05/2017, at which time she was restarted on Requip.    I saw her on 09/12/2015 after her recent sleep study but talked about her results at the time. She reported no new issues. She had some intermittent restless legs symptoms, but we mutually agreed to monitor. She was on supplemental oxygen at night which I asked her to continue as she did not have any overt obstructive sleep apnea type changes during sleep study but lower trending oxygen saturations. Supplemental oxygen was not utilized for the study. She was trying to lose weight and lost a few pounds. I suggested a six-month recheck.   I first met her on 07/23/2015 at the request of her primary care physician, at which time patient reported a prior diagnosis of obstructive sleep apnea, on 8-10 years ago but she was no longer on CPAP therapy and was using oxygen at night. I invited her back for sleep study. She had a diagnostic sleep study on 08/12/2015. I went over her test results with her in detail today. Sleep efficiency was reduced at 82.7% with a latency to sleep of 28 minutes and wake after sleep onset of 57 minutes with mild to moderate sleep fragmentation noted. She had an elevated arousal index. She had an increased percentage of light stage sleep,  absence of slow-wave sleep and a decreased percentage of REM sleep with a prolonged REM latency. She had moderate PLMS with an index of 34 per hour, resulting in 8 arousals per hour. She hadn't moderate snoring. AHI was 3.1 per hour in total, rising to 20 per hour during REM sleep. Average oxygen desaturation was 87%, nadir was 77%. Time below 90% saturation was 6 hours and 42 minutes, time below 88% saturation was 6 hours and 22 minutes. Study was conducted without supplemental oxygen, she uses oxygen at home. She was advised to continue with her home oxygen therapy.   07/23/2015: She was previously diagnosed with obstructive sleep apnea several years ago, maybe 8-10 years ago, but is no longer using CPAP. She uses oxygen at night. Prior sleep study results are not available for my review today. I reviewed your office note from 07/05/2015, which you kindly included. She had blood work on 07/05/15 , which per sister, were okay. She also has had symptoms of restless legs and sometimes has to walk around.   She has  nocturia, about 2-3 times a night. She has occasional morning headaches, about 3 times a week, does not usually take medication.   She does not watch TV in bed.   She goes to bed around 8 PM and takes several medications at night. Rise time is around 6:30 AM. She has a cat, that sleeps in her bed.   She is divorced, no children.   She had an upper respiratory infection about 4 months ago and was placed on oxygen at the time. She currently uses this at night, 2 L/m. She has an appointment with Dr. Halford Chessman in pulmonology pending for June of this year. She has quit smoking many years ago, 1995. She quit drinking alcohol in 1990 and had a history of excessive alcohol use. Her father was an alcoholic. She has 1 sister, [name], and patient lives with her sister and sister's husband. She has no biological children. She is retired from housekeeping at the hospital. She has a high school education. She is  divorced. She drinks caffeine in the form of coffee, 2 cups per day and occasional diet sodas. She moves a lot in her sleep. She would be willing to try CPAP again. Her main issue with CPAP was uncomfortable mask and pressure. She had a CPAP titration study on 07/01/2009 which I was able to review through your records: She had a titration up to 15 cm. Her AHI was reduced according to the report, sleep efficiency 97%, REM latency 209 minutes. She had no slow-wave sleep. A baseline sleep study report was not available for my review and was also not mentioned in results in the CPAP titration study which was interpreted by Dr. Brandon Melnick.   She had a tonsillectomy as a child. Her Epworth sleepiness score is 6 out of 24 today, her fatigue score is 60 out of 63. She sleeps with the head of bed elevated at 45 for perforans. She feels she can breathe better at night like that. She has a hospital bed.      Her Past Medical History Is Significant For: Past Medical History:  Diagnosis Date   Anxiety    Arthritis    Bipolar disorder ([place])    COPD (chronic obstructive pulmonary disease) (HCC)    Diabetes mellitus type II    Esophageal stricture    moderate distal   GERD (gastroesophageal reflux disease)    Heart murmur    "HAD AN ECHO YEARS AGO- NOTHING TO BE CONCERNED ABOUT"   Hyperlipemia    Hypertension    Hypothyroidism    Mental disorder    Bipolar   Obesity    Oxygen desaturation during sleep    wears 2 liters at bedtime    Schizophrenia ([place])    patient reports that she is unaware of this   Shortness of breath    with a little activy   Sleep apnea    wears 2 liters of oxygen at bedtime instead of CPAP    Her Past Surgical History Is Significant For: Past Surgical History:  Procedure Laterality Date   BALLOON DILATION N/A 06/10/2016   Procedure: BALLOON DILATION;  Surgeon: Wilford Corner, MD;  Location: [facility];  Service: Endoscopy;  Laterality: N/A;   BREAST EXCISIONAL BIOPSY  Left 05/10/2012   benign   CHOLECYSTECTOMY     COLONOSCOPY WITH PROPOFOL N/A 01/30/2017   Procedure: COLONOSCOPY WITH PROPOFOL;  Surgeon: Wilford Corner, MD;  Location: [facility];  Service: Endoscopy;  Laterality: N/A;   COLONOSCOPY WITH PROPOFOL N/A  09/29/2020   Procedure: COLONOSCOPY WITH PROPOFOL;  Surgeon: Clarene Essex, MD;  Location: [facility];  Service: Endoscopy;  Laterality: N/A;   ESOPHAGEAL MANOMETRY N/A 12/19/2020   Procedure: ESOPHAGEAL MANOMETRY (EM);  Surgeon: Wilford Corner, MD;  Location: [facility];  Service: Endoscopy;  Laterality: N/A;   ESOPHAGOGASTRODUODENOSCOPY N/A 06/03/2016   Procedure: ESOPHAGOGASTRODUODENOSCOPY (EGD);  Surgeon: Wilford Corner, MD;  [facility] ENDOSCOPY;  Service: Endoscopy;  Laterality: N/A;   ESOPHAGOGASTRODUODENOSCOPY N/A 06/10/2016   Procedure: ESOPHAGOGASTRODUODENOSCOPY (EGD);  Surgeon: Wilford Corner, MD;  [facility] ENDOSCOPY;  Service: Endoscopy;  Laterality: N/A;   ESOPHAGOGASTRODUODENOSCOPY (EGD) WITH PROPOFOL N/A 09/28/2020   Procedure: ESOPHAGOGASTRODUODENOSCOPY (EGD) WITH PROPOFOL;  Surgeon: Arta Silence, MD;  Location: [facility];  Service: Endoscopy;  Laterality: N/A;   EYE SURGERY     PARTIAL MASTECTOMY WITH NEEDLE LOCALIZATION Left 05/10/2012   Procedure: PARTIAL MASTECTOMY WITH NEEDLE LOCALIZATION;  Surgeon: Adin Hector, MD;  Location: [facility];  Service: General;  Laterality: Left;   POLYPECTOMY  09/29/2020   Procedure: POLYPECTOMY;  Surgeon: Clarene Essex, MD;  Location: [facility] ENDOS[facility];  Service: Endoscopy;;   TONSILLECTOMY      Her Family History Is Significant For: Family History  Problem Relation Age of Onset   Alzheimer's disease Mother    Alcohol abuse Father    Stroke Father    Sleep apnea Sister    Restless legs syndrome Neg Hx    Seizures Neg Hx     Her Social History Is Significant For: Social History   Socioeconomic History   Marital status: Divorced    Spouse name: Not on file   Number of  children: 0   Years of education: HS   Highest education level: Not on file  Occupational History   Occupation: Retired   Tobacco Use   Smoking status: Former    Packs/day: 3.00    Years: 29.00    Total pack years: 87.00    Types: Cigarettes    Quit date: 05/03/1993    Years since quitting: 28.8   Smokeless tobacco: Never  Vaping Use   Vaping Use: Never used  Substance and Sexual Activity   Alcohol use: No    Alcohol/week: 0.0 standard drinks of alcohol   Drug use: No   Sexual activity: Not Currently  Other Topics Concern   Not on file  Social History Narrative   Drinks 2 cups of coffee a day   Social Determinants of Health   Financial Resource Strain: Not on file  Food Insecurity: Not on file  Transportation Needs: Not on file  Physical Activity: Not on file  Stress: Not on file  Social Connections: Not on file    Her Allergies Are:  Allergies  Allergen Reactions   Darvocet [Propoxyphene N-Acetaminophen] Anaphylaxis    Can take plain tylenol   Fluoxetine Other (See Comments)    Hallucinations    Penicillins Rash   Promethazine Hives  :   Her Current Medications Are:  Outpatient Encounter Medications as of 03/18/2022  Medication Sig   acetaminophen (TYLENOL) 325 MG tablet Take 650 mg by mouth every 6 (six) hours as needed for moderate pain.   albuterol (PROVENTIL) (2.5 MG/3ML) 0.083% nebulizer solution Take 3 mLs (2.5 mg total) by nebulization every 6 (six) hours as needed for wheezing or shortness of breath.   amLODipine (NORVASC) 2.5 MG tablet Take 2.5 mg by mouth daily.   bumetanide (BUMEX) 1 MG tablet Take 1 tablet (1 mg total) by mouth daily.   Cholecalciferol (  VITAMIN D3) 5000 units TABS Take 5,000 Units by mouth daily.   clonazePAM (KLONOPIN) 0.5 MG tablet Take 1 tablet (0.5 mg total) by mouth in the morning. (Patient taking differently: Take 0.5 mg by mouth at bedtime.)   clonazePAM (KLONOPIN) 1 MG tablet Take 1 tablet (1 mg total) by mouth at bedtime.    diclofenac sodium (VOLTAREN) 1 % GEL Apply 2 g topically 4 (four) times daily as needed (pain).   Ensure Max Protein (ENSURE MAX PROTEIN) LIQD Take 330 mLs (11 oz total) by mouth 2 (two) times daily.   ferrous gluconate (FERGON) 324 MG tablet Take 324 mg by mouth daily with breakfast.   fluticasone (FLONASE) 50 MCG/ACT nasal spray Place 2 sprays into both nostrils daily.   fluticasone furoate-vilanterol (BREO ELLIPTA) 200-25 MCG/INH AEPB Inhale 1 puff into the lungs every morning.   insulin aspart (NOVOLOG FLEXPEN) 100 UNIT/ML FlexPen Inject 2-12 Units into the skin 3 (three) times daily with meals. Sliding scale   LEVEMIR 100 UNIT/ML injection Inject 30 Units into the skin at bedtime.   levothyroxine (SYNTHROID) 100 MCG tablet Take 1 tablet (100 mcg total) by mouth daily before breakfast.   metFORMIN (GLUCOPHAGE) 500 MG tablet Take 500 mg by mouth in the morning and at bedtime.   metoprolol succinate (TOPROL-XL) 100 MG 24 hr tablet Take 100 mg by mouth in the morning. Take with or immediately following a meal.   metoprolol tartrate (LOPRESSOR) 25 MG tablet Take 0.5 tablets (12.5 mg total) by mouth 2 (two) times daily.   montelukast (SINGULAIR) 10 MG tablet Take 10 mg by mouth daily.   Multiple Vitamin (MULTIVITAMIN WITH MINERALS) TABS tablet Take 1 tablet by mouth daily.   nystatin ointment (MYCOSTATIN) Apply 1 application  topically 2 (two) times daily as needed (for rashes).   omeprazole (PRILOSEC) 40 MG capsule Take 40 mg by mouth daily before breakfast.   ondansetron (ZOFRAN) 4 MG tablet Take 1 tablet (4 mg total) by mouth every 6 (six) hours as needed for nausea.   OZEMPIC, 2 MG/DOSE, 8 MG/3ML SOPN Inject 2 mg into the skin every Friday.   PARoxetine (PAXIL) 40 MG tablet Take 1 tablet (40 mg total) by mouth every morning.   polyethylene glycol (MIRALAX / GLYCOLAX) 17 g packet Take 17 g by mouth daily as needed for mild constipation.   PRESCRIPTION MEDICATION BiPAP- At bedtime   rOPINIRole  (REQUIP) 0.25 MG tablet TAKE 3 TABLETS BY MOUTH AT BEDTIME (Patient taking differently: Take 0.75 mg by mouth at bedtime.)   rosuvastatin (CRESTOR) 10 MG tablet Take 10 mg by mouth at bedtime.   traZODone (DESYREL) 100 MG tablet Take 1 tablet (100 mg total) by mouth at bedtime.   TRUEPLUS INSULIN SYRINGE 29G X 1/2" 1 ML MISC 1 each by Other route daily.   VENTOLIN HFA 108 (90 Base) MCG/ACT inhaler Inhale 1-2 puffs into the lungs every 4 (four) hours as needed for wheezing or shortness of breath.   ziprasidone (GEODON) 80 MG capsule Take 1 capsule (80 mg total) by mouth at bedtime.   No facility-administered encounter medications on file as of 03/18/2022.  :   Review of Systems:  Out of a complete 14 point review of systems, all are reviewed and negative with the exception of these symptoms as listed below:  Review of Systems  Neurological:        Pt here for seizure consult. Sister states patient was in chair and turned over in chair Sister states in the  first 10 minutes pt couldn't speak. Pt was admitted to hospital. Pt had increased confusion in hospital  Pt states has little remembrance of event        ESS:10    Objective:  Neurological Exam  Physical Exam Physical Examination:   Vitals:   03/18/22 0835  BP: (!) 152/66  Pulse: 64    General Examination: The patient is a very pleasant 71 y.o. female in no acute distress. She appears well-developed and well-nourished and well groomed.   HEENT: Normocephalic, atraumatic, pupils are equal, round and reactive to light, tracking is difficult for her but stable.  Airway examination reveals moderate mouth dryness, moderate and moderate airway crowding.  Dentures in place, tongue protrudes centrally and palate elevates symmetrically, face is symmetric.  No carotid bruits.   Chest: Clear to auscultation without wheezing, rhonchi or crackles noted.   Heart: S1+S2+0, regular and normal without murmurs, rubs or gallops noted.     Abdomen: Soft, non-tender and non-distended.   Extremities: There is no pitting edema in the distal lower extremities bilaterally.    Skin: Warm and dry without trophic changes noted.    Musculoskeletal: exam reveals no obvious joint deformities, mild right genu valgus.    Neurologically:   Mental status: The patient is awake, alert but unable to provide a detailed history.  Her history is primarily provided by her sister.  Mood is constricted and affect is blunted. Mild psychomotor retardation, stable. Cranial nerves II - XII are as described above under HEENT exam.  Motor exam: Normal bulk, strength and tone is noted. There is no obvious tremor.  Fine motor and coordination: intact, slightly slow.  Stable findings. Reflexes 2+ in the upper extremities, absent in the lower extremities. Cerebellar testing: No dysmetria or intention tremor. There is no truncal or gait ataxia.  Sensory exam: intact to light touch in the UEs and LEs.  Gait, station and balance: She stands with mild difficulty. No veering to one side is noted. No leaning to one side is noted. Posture is age-appropriate and stance is narrow based. Gait shows slow and cautious gait.     Assessment and Plan:  In summary, Chelsea [name] is a very pleasant 71 year old female with an underlying complex medical history of hypothyroidism, hypertension, type 2 diabetes, hyperlipidemia, anemia, obesity, mood disorder, including paranoid schizophrenia for which she is followed by mental health, vitamin D deficiency, allergic rhinitis, osteoarthritis, recent hospitalization for pneumonia, sleep apnea on BiPAP therapy, who presents for a new problem visit of seizure event in late November 2023.  She was suspected to be postictal by the time she was in the hospital.  EEG showed mild slowing over the left hemisphere.  She does have a history of encephalitis as a child and sister recalls that she had right-sided weakness as a child and after she  regained consciousness from her coma she had to learn to speak and walk again.  She had no seizures since childhood, it is possible that she had a seizure event, it could be an isolated event but she may certainly have a lowered seizure threshold given her medical history.  We talked about this at length today and also weighed the pros and cons of starting epilepsy medication.  We mutually agreed to hold off on adding an antiepileptic drug at this time.  She is already on multiple medications and the fear would be mood related side effects and other side effects or interactions.  We will work on lifestyle  modification, in particular, the patient is advised to stay better hydrated with water.  We talked about seizure precautions.  We talked about the importance of maintaining a healthy lifestyle.  She is advised to continue with her BiPAP regularly at night.  We talked about the importance of getting enough rest.  She is advised to continue with clonazepam as per psychiatry prescription and not add any additional dose during the day, it is not a good seizure medication.  She is advised to follow-up routinely to see one of our nurse practitioners for a checkup in 6 months, the sister is strongly advised to give [place] a call or email [place] through [facility] should she have any additional concerns or problems or seizure-like events.  We may utilize an antiepileptic medication in the future.  She is advised to follow-up closely with her other providers.  I answered all their questions today and the patient and her sister were in agreement with our approach.  We mutually agreed to hold off on an EEG repeat at this time, we can certainly consider an EEG in the future.  It will not really change her management at this point in time. I spent 45 minutes in total face-to-face time and in reviewing records during pre-charting, more than 50% of which was spent in counseling and coordination of care, reviewing test results, reviewing  medications and treatment regimen and/or in discussing or reviewing the diagnosis of Seizure event, the prognosis and treatment options. Pertinent laboratory and imaging test results that were available during this visit with the patient were reviewed by me and considered in my medical decision making (see chart for details).

## 2022-03-19 ENCOUNTER — Other Ambulatory Visit (HOSPITAL_COMMUNITY): Payer: Self-pay | Admitting: *Deleted

## 2022-04-01 ENCOUNTER — Inpatient Hospital Stay: Payer: Medicare Other | Admitting: Neurology

## 2022-05-20 ENCOUNTER — Other Ambulatory Visit: Payer: Self-pay | Admitting: Adult Health

## 2022-06-09 ENCOUNTER — Telehealth (HOSPITAL_BASED_OUTPATIENT_CLINIC_OR_DEPARTMENT_OTHER): Payer: 59 | Admitting: Psychiatry

## 2022-06-09 ENCOUNTER — Encounter (HOSPITAL_COMMUNITY): Payer: Self-pay | Admitting: Psychiatry

## 2022-06-09 VITALS — Wt 191.0 lb

## 2022-06-09 DIAGNOSIS — R413 Other amnesia: Secondary | ICD-10-CM

## 2022-06-09 DIAGNOSIS — F200 Paranoid schizophrenia: Secondary | ICD-10-CM

## 2022-06-09 DIAGNOSIS — F411 Generalized anxiety disorder: Secondary | ICD-10-CM

## 2022-06-09 MED ORDER — PAROXETINE HCL 40 MG PO TABS: 40.0000 mg | ORAL_TABLET | Freq: Every morning | ORAL | 0 refills | Status: DC

## 2022-06-09 MED ORDER — TRAZODONE HCL 100 MG PO TABS: 100.0000 mg | ORAL_TABLET | Freq: Every day | ORAL | 0 refills | Status: DC

## 2022-06-09 MED ORDER — ZIPRASIDONE HCL 80 MG PO CAPS: 80.0000 mg | ORAL_CAPSULE | Freq: Every day | ORAL | 0 refills | Status: DC

## 2022-06-09 MED ORDER — CLONAZEPAM 1 MG PO TABS: 1.0000 mg | ORAL_TABLET | Freq: Every day | ORAL | 2 refills | Status: DC

## 2022-06-09 NOTE — Progress Notes (Signed)
[facility] MD Virtual Progress Note   Patient Location: Home Provider Location: Home Office  I connect with patient by telephone and verified that I am speaking with correct person by using two identifiers. I discussed the limitations of evaluation and management by telemedicine and the availability of in person appointments. I also discussed with the patient that there may be a patient responsible charge related to this service. The patient expressed understanding and agreed to proceed.  Chelsea [name] EP:[id number] 72 y.o.  06/09/2022 8:47 AM  History of Present Illness:  Patient is evaluated by phone session.  She just woke up and feels tired but able to talk.  Patient told things are going very well.  She had a good Christmas and went to see the nephew and nieces.  She had a visit with the neurology and no changes.  She did not recall any more episodes of confusion, dizziness.  She does not drive and her sister takes her to the doctor's appointment.  She denies any hallucination, paranoia, suicidal thoughts.  She denies any major panic attack.  She is taking Klonopin, Geodon, trazodone and Paxil.  She admitted some time forgetfulness and decrease in attention concentration but stable.  She is using BiPAP and able to get sleep 7 to 8 hours.  She does go outside with her sister for grocery stores but does not go by herself.  She does see Dr. Moshe Cipro for kidney function.  Her last creatinine is 1.3 and stable.  [facility] who comes at her place and she is happy about it.  She denies any mood swing, anger, agitation.  She has no longer tremors and her mood is stable.  Her appetite is okay and her weight is stable.  She is on Ozempic but is keeping her weight and blood sugars stable.  Past Psychiatric History: H/O inpatient at [facility] and [facility] for mania and psychosis.  No h/o suicidal attempt.     Outpatient Encounter Medications as of  06/09/2022  Medication Sig   acetaminophen (TYLENOL) 325 MG tablet Take 650 mg by mouth every 6 (six) hours as needed for moderate pain.   albuterol (PROVENTIL) (2.5 MG/3ML) 0.083% nebulizer solution Take 3 mLs (2.5 mg total) by nebulization every 6 (six) hours as needed for wheezing or shortness of breath.   amLODipine (NORVASC) 2.5 MG tablet Take 2.5 mg by mouth daily.   bumetanide (BUMEX) 1 MG tablet Take 1 tablet (1 mg total) by mouth daily.   Cholecalciferol (VITAMIN D3) 5000 units TABS Take 5,000 Units by mouth daily.   clonazePAM (KLONOPIN) 1 MG tablet Take 1 tablet (1 mg total) by mouth at bedtime.   diclofenac sodium (VOLTAREN) 1 % GEL Apply 2 g topically 4 (four) times daily as needed (pain).   Ensure Max Protein (ENSURE MAX PROTEIN) LIQD Take 330 mLs (11 oz total) by mouth 2 (two) times daily.   ferrous gluconate (FERGON) 324 MG tablet Take 324 mg by mouth daily with breakfast.   fluticasone (FLONASE) 50 MCG/ACT nasal spray Place 2 sprays into both nostrils daily.   fluticasone furoate-vilanterol (BREO ELLIPTA) 200-25 MCG/INH AEPB Inhale 1 puff into the lungs every morning.   insulin aspart (NOVOLOG FLEXPEN) 100 UNIT/ML FlexPen Inject 2-12 Units into the skin 3 (three) times daily with meals. Sliding scale   LEVEMIR 100 UNIT/ML injection Inject 30 Units into the skin at bedtime.   levothyroxine (SYNTHROID) 100 MCG tablet Take 1 tablet (100 mcg  total) by mouth daily before breakfast.   metFORMIN (GLUCOPHAGE) 500 MG tablet Take 500 mg by mouth in the morning and at bedtime.   metoprolol succinate (TOPROL-XL) 100 MG 24 hr tablet Take 100 mg by mouth in the morning. Take with or immediately following a meal.   metoprolol tartrate (LOPRESSOR) 25 MG tablet Take 0.5 tablets (12.5 mg total) by mouth 2 (two) times daily.   montelukast (SINGULAIR) 10 MG tablet Take 10 mg by mouth daily.   Multiple Vitamin (MULTIVITAMIN WITH MINERALS) TABS tablet Take 1 tablet by mouth daily.   nystatin ointment  (MYCOSTATIN) Apply 1 application  topically 2 (two) times daily as needed (for rashes).   omeprazole (PRILOSEC) 40 MG capsule Take 40 mg by mouth daily before breakfast.   ondansetron (ZOFRAN) 4 MG tablet Take 1 tablet (4 mg total) by mouth every 6 (six) hours as needed for nausea.   OZEMPIC, 2 MG/DOSE, 8 MG/3ML SOPN Inject 2 mg into the skin every Friday.   PARoxetine (PAXIL) 40 MG tablet Take 1 tablet (40 mg total) by mouth every morning.   polyethylene glycol (MIRALAX / GLYCOLAX) 17 g packet Take 17 g by mouth daily as needed for mild constipation.   PRESCRIPTION MEDICATION BiPAP- At bedtime   rOPINIRole (REQUIP) 0.25 MG tablet TAKE 3 TABLETS BY MOUTH AT BEDTIME   rosuvastatin (CRESTOR) 10 MG tablet Take 10 mg by mouth at bedtime.   traZODone (DESYREL) 100 MG tablet Take 1 tablet (100 mg total) by mouth at bedtime.   TRUEPLUS INSULIN SYRINGE 29G X 1/2" 1 ML MISC 1 each by Other route daily.   VENTOLIN HFA 108 (90 Base) MCG/ACT inhaler Inhale 1-2 puffs into the lungs every 4 (four) hours as needed for wheezing or shortness of breath.   ziprasidone (GEODON) 80 MG capsule Take 1 capsule (80 mg total) by mouth at bedtime.   No facility-administered encounter medications on file as of 06/09/2022.    No results found for this or any previous visit (from the past 2160 hour(s)).   Psychiatric Specialty Exam: Physical Exam  Review of Systems  Weight 191 lb (86.6 kg).Body mass index is 34.93 kg/m.  General Appearance: NA  Eye Contact:  NA  Speech:  Slow  Volume:  Decreased  Mood:   tired  Affect:  NA  Thought Process:  Descriptions of Associations: Intact  Orientation:  Full (Time, Place, and Person)  Thought Content:   slow, no paranoia or delusion.   Suicidal Thoughts:  No  Homicidal Thoughts:  No  Memory:  Immediate;   Fair Recent;   Fair Remote;   Fair  Judgement:  Fair  Insight:  Shallow  Psychomotor Activity:  Decreased  Concentration:  Concentration: Fair and Attention Span:  Fair  Recall:  [employer] of Knowledge:  Fair  Language:  Fair  Akathisia:  No  Handed:  Right  AIMS (if indicated):     Assets:  Communication Skills Desire for Improvement Housing Social Support  ADL's:  Impaired  Cognition:  Impaired,  Mild  Sleep:  7- 8 hrs, using BIPAP     Assessment/Plan: Paranoid schizophrenia ([place]) - Plan: PARoxetine (PAXIL) 40 MG tablet, ziprasidone (GEODON) 80 MG capsule, traZODone (DESYREL) 100 MG tablet, clonazePAM (KLONOPIN) 1 MG tablet  Generalized anxiety disorder - Plan: clonazePAM (KLONOPIN) 1 MG tablet  Patient is stable on current medication.  I reviewed notes from neurology, blood work results from other providers.  No more episodes of confusion, fall or dizziness.  She did talk about increasing her dose of Klonopin however I explained that she is on multiple medication and adding more Klonopin may not help for her memory attention concentration.  She did not recall any major panic attack and current dose is sufficient.  Patient does not want to decrease the dose of medication as she feels her symptoms are stable.  We will continue Paxil 40 mg daily, Geodon 80 mg at bedtime, Klonopin 1 mg at bedtime and Paxil 40 mg daily.  Recommended to call us back if she has any question or any concern.  Follow-up in 3 months.   Follow Up Instructions:     I discussed the assessment and treatment plan with the patient. The patient was provided an opportunity to ask questions and all were answered. The patient agreed with the plan and demonstrated an understanding of the instructions.   The patient was advised to call back or seek an in-person evaluation if the symptoms worsen or if the condition fails to improve as anticipated.    Collaboration of Care: Other provider involved in patient's care AEB notes are available in epic to review.  Patient/Guardian was advised Release of Information must be obtained prior to any record release in order to collaborate  their care with an outside provider. Patient/Guardian was advised if they have not already done so to contact the registration department to sign all necessary forms in order for [place] to release information regarding their care.   Consent: Patient/Guardian gives verbal consent for treatment and assignment of benefits for services provided during this visit. Patient/Guardian expressed understanding and agreed to proceed.     I provided 29 minutes of non face to face time during this encounter.  Kathlee Nations, MD 06/09/2022

## 2022-06-16 ENCOUNTER — Telehealth: Payer: Self-pay | Admitting: Oncology

## 2022-06-16 NOTE — Telephone Encounter (Signed)
Attempted to contact patient in regards to referral and scheduling appointment. No answer so voicemail was left

## 2022-06-17 ENCOUNTER — Ambulatory Visit: Payer: 59 | Admitting: Emergency Medicine

## 2022-06-19 ENCOUNTER — Other Ambulatory Visit: Payer: Self-pay | Admitting: Gastroenterology

## 2022-06-19 ENCOUNTER — Ambulatory Visit
Admission: RE | Admit: 2022-06-19 | Discharge: 2022-06-19 | Disposition: A | Discharge status: Home / Self Care | Payer: 59 | Source: Ambulatory Visit | Attending: Gastroenterology | Admitting: Gastroenterology

## 2022-06-19 DIAGNOSIS — D509 Iron deficiency anemia, unspecified: Secondary | ICD-10-CM

## 2022-06-26 ENCOUNTER — Ambulatory Visit: Payer: 59 | Admitting: Emergency Medicine

## 2022-07-04 ENCOUNTER — Other Ambulatory Visit: Payer: Self-pay | Admitting: Nurse Practitioner

## 2022-07-04 DIAGNOSIS — D649 Anemia, unspecified: Secondary | ICD-10-CM

## 2022-07-10 NOTE — Progress Notes (Signed)
New Hematology/Oncology Consult   Requesting MD: Dr. Charlott Rakes  [phone]      Reason for Consult: Iron deficiency anemia  HPI: Chelsea [name] is a 72 year old woman referred for evaluation of iron deficiency anemia.  She was hospitalized with anemia, heme positive stool July 2022.  CBC showed hemoglobin 6.1, MCV 72, normal white count and platelets, ferritin 4, B12 136.  She received Red cell transfusion support.  EGD 09/28/2020 showed esophageal plaques consistent with candidiasis, gastritis.  She underwent a colonoscopy 09/29/2020 with findings to include internal hemorrhoids, 2 small polyps in the mid transverse colon and distal ascending colon.    She was hospitalized December 2022 with another GI bleed.  Hemoglobin 7.5, MCV 84, white count and platelets normal, stool positive for occult blood.  She received red cell transfusion support.    Due to persistent anemia she had a capsule endoscopy 06/05/2022, the study was normal.  Most recent labs in the EMR are from December 2023 at which time she was hospitalized with acute metabolic encephalopathy due to seizure.  Admission CBC returned with a hemoglobin of 6.7, MCV 81, white count 8.5, platelet count 257,000, B12 1900, creatinine 1.3.  Most remote CBC is from 02/05/2008-hemoglobin 12.4, MCV 89.  She is not aware of any bleeding.  She has been taking 1 iron tablet daily for many months.  She has mild intermittent constipation.   Past Medical History:  Diagnosis Date   Anxiety    Arthritis    Bipolar disorder (HCC)    COPD (chronic obstructive pulmonary disease) (HCC)    Diabetes mellitus type II    Esophageal stricture    moderate distal   GERD (gastroesophageal reflux disease)    Heart murmur    "HAD AN ECHO YEARS AGO- NOTHING TO BE CONCERNED ABOUT"   Hyperlipemia    Hypertension    Hypothyroidism    Mental disorder    Bipolar   Obesity    Oxygen desaturation during sleep    wears 2 liters at bedtime    Schizophrenia  (HCC)    patient reports that she is unaware of this   Shortness of breath    with a little activy   Sleep apnea    wears 2 liters of oxygen at bedtime instead of CPAP     Past Surgical History:  Procedure Laterality Date   BALLOON DILATION N/A 06/10/2016   Procedure: BALLOON DILATION;  Surgeon: Charlott Rakes, MD;  Location: [facility] ENDOSCOPY;  Service: Endoscopy;  Laterality: N/A;   BREAST EXCISIONAL BIOPSY Left 05/10/2012   benign   CHOLECYSTECTOMY     COLONOSCOPY WITH PROPOFOL N/A 01/30/2017   Procedure: COLONOSCOPY WITH PROPOFOL;  Surgeon: Charlott Rakes, MD;  Location: [facility] ENDOSCOPY;  Service: Endoscopy;  Laterality: N/A;   COLONOSCOPY WITH PROPOFOL N/A 09/29/2020   Procedure: COLONOSCOPY WITH PROPOFOL;  Surgeon: Vida Rigger, MD;  Location: [facility] ENDOSCOPY;  Service: Endoscopy;  Laterality: N/A;   ESOPHAGEAL MANOMETRY N/A 12/19/2020   Procedure: ESOPHAGEAL MANOMETRY (EM);  Surgeon: Charlott Rakes, MD;  Location: [facility] ENDOSCOPY;  Service: Endoscopy;  Laterality: N/A;   ESOPHAGOGASTRODUODENOSCOPY N/A 06/03/2016   Procedure: ESOPHAGOGASTRODUODENOSCOPY (EGD);  Surgeon: Charlott Rakes, MD;  Location: [facility] ENDOSCOPY;  Service: Endoscopy;  Laterality: N/A;   ESOPHAGOGASTRODUODENOSCOPY N/A 06/10/2016   Procedure: ESOPHAGOGASTRODUODENOSCOPY (EGD);  Surgeon: Charlott Rakes, MD;  Location: [facility] ENDOSCOPY;  Service: Endoscopy;  Laterality: N/A;   ESOPHAGOGASTRODUODENOSCOPY (EGD) WITH PROPOFOL N/A 09/28/2020   Procedure: ESOPHAGOGASTRODUODENOSCOPY (EGD) WITH PROPOFOL;  Surgeon: Willis Modena, MD;  Location:  [facility] ENDOSCOPY;  Service: Endoscopy;  Laterality: N/A;   EYE SURGERY     PARTIAL MASTECTOMY WITH NEEDLE LOCALIZATION Left 05/10/2012   Procedure: PARTIAL MASTECTOMY WITH NEEDLE LOCALIZATION;  Surgeon: Ernestene MentionHaywood M Ingram, MD;  Location: [facility] OR;  Service: General;  Laterality: Left;   POLYPECTOMY  09/29/2020   Procedure: POLYPECTOMY;  Surgeon: Vida RiggerMagod, Marc, MD;  Location: [facility] ENDOSCOPY;  Service: Endoscopy;;    TONSILLECTOMY       Current Outpatient Medications:    acetaminophen (TYLENOL) 325 MG tablet, Take 650 mg by mouth every 6 (six) hours as needed for moderate pain., Disp: , Rfl:    albuterol (PROVENTIL) (2.5 MG/3ML) 0.083% nebulizer solution, Take 3 mLs (2.5 mg total) by nebulization every 6 (six) hours as needed for wheezing or shortness of breath., Disp: 75 mL, Rfl: 12   amLODipine (NORVASC) 2.5 MG tablet, Take 2.5 mg by mouth daily., Disp: , Rfl:    bumetanide (BUMEX) 1 MG tablet, Take 1 tablet (1 mg total) by mouth daily., Disp: 30 tablet, Rfl: 0   Cholecalciferol (VITAMIN D3) 5000 units TABS, Take 5,000 Units by mouth daily., Disp: , Rfl:    clonazePAM (KLONOPIN) 1 MG tablet, Take 1 tablet (1 mg total) by mouth at bedtime., Disp: 30 tablet, Rfl: 2   diclofenac sodium (VOLTAREN) 1 % GEL, Apply 2 g topically 4 (four) times daily as needed (pain)., Disp: , Rfl:    Ensure Max Protein (ENSURE MAX PROTEIN) LIQD, Take 330 mLs (11 oz total) by mouth 2 (two) times daily., Disp: 3300 mL, Rfl: 0   ferrous gluconate (FERGON) 324 MG tablet, Take 324 mg by mouth daily with breakfast., Disp: , Rfl:    fluticasone (FLONASE) 50 MCG/ACT nasal spray, Place 2 sprays into both nostrils daily., Disp: , Rfl:    fluticasone furoate-vilanterol (BREO ELLIPTA) 200-25 MCG/INH AEPB, Inhale 1 puff into the lungs every morning., Disp: , Rfl:    insulin aspart (NOVOLOG FLEXPEN) 100 UNIT/ML FlexPen, Inject 2-12 Units into the skin 3 (three) times daily with meals. Sliding scale, Disp: , Rfl:    LEVEMIR 100 UNIT/ML injection, Inject 30 Units into the skin at bedtime., Disp: , Rfl:    levothyroxine (SYNTHROID) 100 MCG tablet, Take 1 tablet (100 mcg total) by mouth daily before breakfast., Disp: 30 tablet, Rfl: 3   metFORMIN (GLUCOPHAGE) 500 MG tablet, Take 500 mg by mouth in the morning and at bedtime., Disp: , Rfl:    metoprolol succinate (TOPROL-XL) 100 MG 24 hr tablet, Take 100 mg by mouth in the morning. Take with or  immediately following a meal., Disp: , Rfl:    metoprolol tartrate (LOPRESSOR) 25 MG tablet, Take 0.5 tablets (12.5 mg total) by mouth 2 (two) times daily., Disp: 60 tablet, Rfl: 0   montelukast (SINGULAIR) 10 MG tablet, Take 10 mg by mouth daily., Disp: , Rfl:    Multiple Vitamin (MULTIVITAMIN WITH MINERALS) TABS tablet, Take 1 tablet by mouth daily., Disp: 30 tablet, Rfl: 0   nystatin ointment (MYCOSTATIN), Apply 1 application  topically 2 (two) times daily as needed (for rashes)., Disp: , Rfl:    omeprazole (PRILOSEC) 40 MG capsule, Take 40 mg by mouth daily before breakfast., Disp: , Rfl:    OZEMPIC, 2 MG/DOSE, 8 MG/3ML SOPN, Inject 2 mg into the skin every Friday., Disp: , Rfl:    PARoxetine (PAXIL) 40 MG tablet, Take 1 tablet (40 mg total) by mouth every morning., Disp: 90 tablet, Rfl: 0   polyethylene glycol (MIRALAX / GLYCOLAX)  17 g packet, Take 17 g by mouth daily as needed for mild constipation., Disp: 14 each, Rfl: 0   PRESCRIPTION MEDICATION, BiPAP- At bedtime, Disp: , Rfl:    rOPINIRole (REQUIP) 0.25 MG tablet, TAKE 3 TABLETS BY MOUTH AT BEDTIME, Disp: 90 tablet, Rfl: 2   rosuvastatin (CRESTOR) 10 MG tablet, Take 10 mg by mouth at bedtime., Disp: , Rfl:    traZODone (DESYREL) 100 MG tablet, Take 1 tablet (100 mg total) by mouth at bedtime., Disp: 90 tablet, Rfl: 0   TRUEPLUS INSULIN SYRINGE 29G X 1/2" 1 ML MISC, 1 each by Other route daily., Disp: , Rfl:    VENTOLIN HFA 108 (90 Base) MCG/ACT inhaler, Inhale 1-2 puffs into the lungs every 4 (four) hours as needed for wheezing or shortness of breath., Disp: , Rfl:    ziprasidone (GEODON) 80 MG capsule, Take 1 capsule (80 mg total) by mouth at bedtime., Disp: 90 capsule, Rfl: 0   ondansetron (ZOFRAN) 4 MG tablet, Take 1 tablet (4 mg total) by mouth every 6 (six) hours as needed for nausea. (Patient not taking: Reported on 07/11/2022), Disp: 20 tablet, Rfl: 0:     Allergies  Allergen Reactions   Darvocet [Propoxyphene N-Acetaminophen]  Anaphylaxis    Can take plain tylenol   Fluoxetine Other (See Comments)    Hallucinations    Penicillins Rash   Promethazine Hives    FH: No family history of a blood disorder.  No family history of malignancy.  SOCIAL HISTORY: She lives in [place] with her sister.  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  No children.  She quit smoking 30 years ago.  She quit drinking alcohol 30 years ago.  Review of Systems: No fevers or sweats.  She has a good appetite.  No weight loss.  She denies bleeding.  Specifically no bloody or black stools, no vaginal bleeding, no hematuria.  She has bilateral knee pain which she attributes to arthritis.  Occasional leg pain.  No unusual headaches.  No vision change.  Mild dyspnea on exertion.  No chest pain.  No change in bowel habits.  No urinary symptoms.  Physical Exam:  Blood pressure 139/70, pulse 69, temperature 98.2 F (36.8 C), temperature source Oral, resp. rate 18, height 5\' 2"  (1.575 m), weight 195 lb (88.5 kg), SpO2 99 %.  HEENT: No thrush or ulcers. Lungs: Lungs clear bilaterally. Cardiac: Regular rate and rhythm. Abdomen: No hepatosplenomegaly. Vascular: No leg edema. Lymph nodes: No palpable cervical, supraclavicular, axillary or inguinal lymph nodes. Neurologic: Alert and oriented. Skin: No rash.   LABS:   Recent Labs    07/11/22 1044  WBC 6.3  HGB 10.4*  HCT 33.3*  PLT 216  Peripheral blood smear-moderate number of ovalocytes, few microcytes; few band forms; platelets appear normal in number, scattered large platelets.  No results for input(s): "NA", "K", "CL", "CO2", "GLUCOSE", "BUN", "CREATININE", "CALCIUM" in the last 72 hours.    RADIOLOGY:  DG Abd 2 Views  Result Date: 06/20/2022 CLINICAL DATA:  Iron deficiency anemia EXAM: ABDOMEN - 2 VIEW COMPARISON:  None Available. FINDINGS: Lung bases are normal. No free air, portal venous gas, or pneumatosis. No renal or ureteral stones are identified. Moderate to  severe fecal loading throughout the length of the colon. No bowel obstruction. No other soft tissue abnormalities are noted. IMPRESSION: Moderate to severe fecal loading throughout the length of the colon. Electronically Signed   By: Gerome Sam III M.D.   On: 06/20/2022 07:16  Assessment and Plan:   Iron deficiency anemia Admission with GI bleed July 2022, December 2022 09/28/2020 EGD-esophageal plaques consistent with candidiasis, gastritis 09/29/2020 colonoscopy-internal hemorrhoids, 2 small polyps in the mid transverse colon and distal ascending colon 06/05/2022 capsule endoscopy-normal Chronic kidney disease COPD Sleep apnea Depression Schizophrenia Diabetes Hypertension Hypothyroid Elevated cholesterol Osteoarthritis  Chelsea [name] was referred for further evaluation of iron deficiency anemia.  She was admitted with GI bleeds July 2022, December 2022.  She has undergone EGD, colonoscopy, capsule endoscopy with no source of blood loss identified.  She has been taking oral iron once daily consistently for many months.  Labs from today show a hemoglobin of 10.4, MCV 81 (low normal range), ferritin 12 (low normal range).  She will increase oral iron to twice daily.  We discussed the possibility of chronic kidney disease contributing to the anemia as well.  She will return for a follow-up CBC and visit in 6 weeks.  We are available to see her sooner if needed.  Patient seen with Dr. Truett Perna.   Lonna Cobb, NP 07/11/2022, 11:22 AM  This was a shared visit with Lonna Cobb.  Chelsea [name] was interviewed and examined.  I reviewed the peripheral blood smear. She is referred for evaluation of iron deficiency anemia.  She has a history of microcytic anemia dating back at least to June 2022.  She has a history of GI bleeding with no clear GI source for blood loss identified.  The ferritin remains in the low normal range.  The peripheral blood smear is consistent with iron deficiency.  She will  increase the oral iron supplement.  There is no indication for IV iron at present.  The anemia could also be in part related to chronic renal insufficiency.  We recommend she continue follow-up with Dr. Bosie Clos and her primary provider to look for a source of blood loss.  I was present for greater than 50% of today's visit.  I performed medical decision making.  Mancel Bale, MD

## 2022-07-11 ENCOUNTER — Inpatient Hospital Stay: Payer: 59 | Attending: Nurse Practitioner

## 2022-07-11 ENCOUNTER — Inpatient Hospital Stay (HOSPITAL_BASED_OUTPATIENT_CLINIC_OR_DEPARTMENT_OTHER): Payer: 59 | Admitting: Nurse Practitioner

## 2022-07-11 VITALS — BP 139/70 | HR 69 | Temp 98.2°F | Resp 18 | Ht 62.0 in | Wt 195.0 lb

## 2022-07-11 DIAGNOSIS — Z7984 Long term (current) use of oral hypoglycemic drugs: Secondary | ICD-10-CM | POA: Diagnosis not present

## 2022-07-11 DIAGNOSIS — E039 Hypothyroidism, unspecified: Secondary | ICD-10-CM | POA: Insufficient documentation

## 2022-07-11 DIAGNOSIS — G4730 Sleep apnea, unspecified: Secondary | ICD-10-CM | POA: Insufficient documentation

## 2022-07-11 DIAGNOSIS — D509 Iron deficiency anemia, unspecified: Secondary | ICD-10-CM | POA: Diagnosis not present

## 2022-07-11 DIAGNOSIS — J449 Chronic obstructive pulmonary disease, unspecified: Secondary | ICD-10-CM | POA: Insufficient documentation

## 2022-07-11 DIAGNOSIS — N189 Chronic kidney disease, unspecified: Secondary | ICD-10-CM | POA: Insufficient documentation

## 2022-07-11 DIAGNOSIS — Z7951 Long term (current) use of inhaled steroids: Secondary | ICD-10-CM | POA: Diagnosis not present

## 2022-07-11 DIAGNOSIS — E7800 Pure hypercholesterolemia, unspecified: Secondary | ICD-10-CM | POA: Diagnosis not present

## 2022-07-11 DIAGNOSIS — M1990 Unspecified osteoarthritis, unspecified site: Secondary | ICD-10-CM | POA: Diagnosis not present

## 2022-07-11 DIAGNOSIS — Z87891 Personal history of nicotine dependence: Secondary | ICD-10-CM | POA: Diagnosis not present

## 2022-07-11 DIAGNOSIS — Z79899 Other long term (current) drug therapy: Secondary | ICD-10-CM | POA: Diagnosis not present

## 2022-07-11 DIAGNOSIS — F209 Schizophrenia, unspecified: Secondary | ICD-10-CM | POA: Diagnosis not present

## 2022-07-11 DIAGNOSIS — D649 Anemia, unspecified: Secondary | ICD-10-CM

## 2022-07-11 DIAGNOSIS — K5900 Constipation, unspecified: Secondary | ICD-10-CM | POA: Diagnosis not present

## 2022-07-11 DIAGNOSIS — Z794 Long term (current) use of insulin: Secondary | ICD-10-CM | POA: Insufficient documentation

## 2022-07-11 DIAGNOSIS — E1122 Type 2 diabetes mellitus with diabetic chronic kidney disease: Secondary | ICD-10-CM | POA: Insufficient documentation

## 2022-07-11 DIAGNOSIS — I129 Hypertensive chronic kidney disease with stage 1 through stage 4 chronic kidney disease, or unspecified chronic kidney disease: Secondary | ICD-10-CM | POA: Diagnosis not present

## 2022-07-11 LAB — CBC WITH DIFFERENTIAL (CANCER CENTER ONLY)
Abs Immature Granulocytes: 0.01 10*3/uL (ref 0.00–0.07)
Basophils Absolute: 0 10*3/uL (ref 0.0–0.1)
Basophils Relative: 0 %
Eosinophils Absolute: 0.3 10*3/uL (ref 0.0–0.5)
Eosinophils Relative: 5 %
HCT: 33.3 % — ABNORMAL LOW (ref 36.0–46.0)
Hemoglobin: 10.4 g/dL — ABNORMAL LOW (ref 12.0–15.0)
Immature Granulocytes: 0 %
Lymphocytes Relative: 21 %
Lymphs Abs: 1.3 10*3/uL (ref 0.7–4.0)
MCH: 25.3 pg — ABNORMAL LOW (ref 26.0–34.0)
MCHC: 31.2 g/dL (ref 30.0–36.0)
MCV: 81 fL (ref 80.0–100.0)
Monocytes Absolute: 0.5 10*3/uL (ref 0.1–1.0)
Monocytes Relative: 7 %
Neutro Abs: 4.2 10*3/uL (ref 1.7–7.7)
Neutrophils Relative %: 67 %
Platelet Count: 216 10*3/uL (ref 150–400)
RBC: 4.11 MIL/uL (ref 3.87–5.11)
RDW: 14.6 % (ref 11.5–15.5)
WBC Count: 6.3 10*3/uL (ref 4.0–10.5)
nRBC: 0 % (ref 0.0–0.2)

## 2022-07-11 LAB — FERRITIN: Ferritin: 12 ng/mL (ref 11–307)

## 2022-07-11 LAB — SAVE SMEAR(SSMR), FOR PROVIDER SLIDE REVIEW

## 2022-07-11 MED ORDER — FERROUS GLUCONATE 324 (38 FE) MG PO TABS: 324.0000 mg | ORAL_TABLET | Freq: Two times a day (BID) | ORAL | 3 refills | Status: DC

## 2022-07-14 ENCOUNTER — Emergency Department (HOSPITAL_BASED_OUTPATIENT_CLINIC_OR_DEPARTMENT_OTHER): Payer: 59

## 2022-07-14 ENCOUNTER — Other Ambulatory Visit: Payer: Self-pay

## 2022-07-14 ENCOUNTER — Emergency Department (HOSPITAL_BASED_OUTPATIENT_CLINIC_OR_DEPARTMENT_OTHER)
Admission: EM | Admit: 2022-07-14 | Discharge: 2022-07-14 | Disposition: A | Discharge status: Home / Self Care | Payer: 59 | Attending: Emergency Medicine | Admitting: Emergency Medicine

## 2022-07-14 ENCOUNTER — Encounter (HOSPITAL_BASED_OUTPATIENT_CLINIC_OR_DEPARTMENT_OTHER): Payer: Self-pay

## 2022-07-14 DIAGNOSIS — J449 Chronic obstructive pulmonary disease, unspecified: Secondary | ICD-10-CM | POA: Diagnosis not present

## 2022-07-14 DIAGNOSIS — I10 Essential (primary) hypertension: Secondary | ICD-10-CM | POA: Diagnosis not present

## 2022-07-14 DIAGNOSIS — T17320A Food in larynx causing asphyxiation, initial encounter: Secondary | ICD-10-CM | POA: Insufficient documentation

## 2022-07-14 DIAGNOSIS — Z79899 Other long term (current) drug therapy: Secondary | ICD-10-CM | POA: Insufficient documentation

## 2022-07-14 DIAGNOSIS — W44F3XA Food entering into or through a natural orifice, initial encounter: Secondary | ICD-10-CM | POA: Diagnosis not present

## 2022-07-14 LAB — CBC WITH DIFFERENTIAL/PLATELET
Abs Immature Granulocytes: 0.06 10*3/uL (ref 0.00–0.07)
Basophils Absolute: 0 10*3/uL (ref 0.0–0.1)
Basophils Relative: 0 %
Eosinophils Absolute: 0.2 10*3/uL (ref 0.0–0.5)
Eosinophils Relative: 1 %
HCT: 32.6 % — ABNORMAL LOW (ref 36.0–46.0)
Hemoglobin: 10.3 g/dL — ABNORMAL LOW (ref 12.0–15.0)
Immature Granulocytes: 1 %
Lymphocytes Relative: 12 %
Lymphs Abs: 1.4 10*3/uL (ref 0.7–4.0)
MCH: 25.6 pg — ABNORMAL LOW (ref 26.0–34.0)
MCHC: 31.6 g/dL (ref 30.0–36.0)
MCV: 81.1 fL (ref 80.0–100.0)
Monocytes Absolute: 0.9 10*3/uL (ref 0.1–1.0)
Monocytes Relative: 7 %
Neutro Abs: 9.4 10*3/uL — ABNORMAL HIGH (ref 1.7–7.7)
Neutrophils Relative %: 79 %
Platelets: 174 10*3/uL (ref 150–400)
RBC: 4.02 MIL/uL (ref 3.87–5.11)
RDW: 14.6 % (ref 11.5–15.5)
WBC: 11.9 10*3/uL — ABNORMAL HIGH (ref 4.0–10.5)
nRBC: 0 % (ref 0.0–0.2)

## 2022-07-14 LAB — BASIC METABOLIC PANEL
Anion gap: 9 (ref 5–15)
BUN: 42 mg/dL — ABNORMAL HIGH (ref 8–23)
CO2: 26 mmol/L (ref 22–32)
Calcium: 9.3 mg/dL (ref 8.9–10.3)
Chloride: 100 mmol/L (ref 98–111)
Creatinine, Ser: 1.47 mg/dL — ABNORMAL HIGH (ref 0.44–1.00)
GFR, Estimated: 38 mL/min — ABNORMAL LOW (ref >60)
Glucose, Bld: 384 mg/dL — ABNORMAL HIGH (ref 70–99)
Potassium: 4.4 mmol/L (ref 3.5–5.1)
Sodium: 135 mmol/L (ref 135–145)

## 2022-07-14 MED ORDER — IOHEXOL 300 MG/ML  SOLN
100.0000 mL | Freq: Once | INTRAMUSCULAR | Status: AC | PRN
  Administered 2022-07-14: 60 mL via INTRAVENOUS

## 2022-07-14 MED ORDER — GLUCAGON HCL RDNA (DIAGNOSTIC) 1 MG IJ SOLR
1.0000 mg | Freq: Once | INTRAMUSCULAR | Status: AC
  Administered 2022-07-14: 1 mg via INTRAVENOUS
  Filled 2022-07-14: qty 1

## 2022-07-14 NOTE — Discharge Instructions (Addendum)
You are seen today in the emergency department due to food bolus.  Take Prilosec 40 mg once in the morning and once in the evening.  Please call Dr. Louis Matte office schedule follow-up for later this week, call tomorrow to schedule an appointment.  Return to the ED if this happens again or if you have any new or worsening symptoms.

## 2022-07-14 NOTE — ED Notes (Signed)
DC papers reviewed. No questions or concerns. No signs of distress. Pt assisted to wheelchair and out to lobby. Appropriate measures for safety taken. 

## 2022-07-14 NOTE — ED Triage Notes (Signed)
Patient arrives with complaints of choking that started prior to arrival. Patients sister states that she was eating a hotdog and started choking on it.  Has a hx of choking in the past.

## 2022-07-14 NOTE — ED Notes (Signed)
Pt spits up hot dog segment. Confirmed visually in emesis bag. Pt is now able to tolerate water.

## 2022-07-14 NOTE — ED Notes (Signed)
Difficulty swallowing fluid. Feels need to spit up water. Does not cough or choke during this PO challenge.

## 2022-07-14 NOTE — ED Provider Notes (Signed)
[place] EMERGENCY DEPARTMENT AT [facility] Provider Note   CSN: [id number] Arrival date & time: 07/14/22  1421     History  Chief Complaint  Patient presents with   Choking    Chelsea [name] is a 72 y.o. female.  HPI   Patient with medical history of COPD on as needed oxygen, hypertension, lipidemia, frequent food impaction followed by Cheryl Flash with [facility] presents to the emergency department due to ingestion.  Patient swallowed a hotdog about an hour prior to arrival, she feels like she is choking since then.  Did not feel short of breath but presented hypoxic on 77%, satting at 92 on 2 L currently.  She does not feel short of breath, she is coughing up secretions unable to eat or drink without spitting up.  Home Medications Prior to Admission medications   Medication Sig Start Date End Date Taking? Authorizing Provider  acetaminophen (TYLENOL) 325 MG tablet Take 650 mg by mouth every 6 (six) hours as needed for moderate pain.    [provider]  albuterol (PROVENTIL) (2.5 MG/3ML) 0.083% nebulizer solution Take 3 mLs (2.5 mg total) by nebulization every 6 (six) hours as needed for wheezing or shortness of breath. 02/20/22   Ghimire, Werner Lean, MD  amLODipine (NORVASC) 2.5 MG tablet Take 2.5 mg by mouth daily.    [provider]  bumetanide (BUMEX) 1 MG tablet Take 1 tablet (1 mg total) by mouth daily. 12/04/12   Christiane Ha, MD  Cholecalciferol (VITAMIN D3) 5000 units TABS Take 5,000 Units by mouth daily.    [provider]  clonazePAM (KLONOPIN) 1 MG tablet Take 1 tablet (1 mg total) by mouth at bedtime. 06/09/22   Arfeen, Phillips Grout, MD  diclofenac sodium (VOLTAREN) 1 % GEL Apply 2 g topically 4 (four) times daily as needed (pain).    [provider]  Ensure Max Protein (ENSURE MAX PROTEIN) LIQD Take 330 mLs (11 oz total) by mouth 2 (two) times daily. 03/23/21   Marguerita Merles Latif, DO  ferrous gluconate (FERGON) 324 MG tablet Take  1 tablet (324 mg total) by mouth 2 (two) times daily with a meal. 07/11/22   Rana Snare, NP  fluticasone (FLONASE) 50 MCG/ACT nasal spray Place 2 sprays into both nostrils daily.    [provider]  fluticasone furoate-vilanterol (BREO ELLIPTA) 200-25 MCG/INH AEPB Inhale 1 puff into the lungs every morning.    [provider]  insulin aspart (NOVOLOG FLEXPEN) 100 UNIT/ML FlexPen Inject 2-12 Units into the skin 3 (three) times daily with meals. Sliding scale    [provider]  LEVEMIR 100 UNIT/ML injection Inject 30 Units into the skin at bedtime. 07/21/18   [provider]  levothyroxine (SYNTHROID) 100 MCG tablet Take 1 tablet (100 mcg total) by mouth daily before breakfast. 03/01/22   Danford, Earl Lites, MD  metFORMIN (GLUCOPHAGE) 500 MG tablet Take 500 mg by mouth in the morning and at bedtime. 01/23/20   [provider]  metoprolol succinate (TOPROL-XL) 100 MG 24 hr tablet Take 100 mg by mouth in the morning. Take with or immediately following a meal.    [provider]  metoprolol tartrate (LOPRESSOR) 25 MG tablet Take 0.5 tablets (12.5 mg total) by mouth 2 (two) times daily. 03/23/21   Sheikh, Omair Latif, DO  montelukast (SINGULAIR) 10 MG tablet Take 10 mg by mouth daily. 01/23/20   [provider]  Multiple Vitamin (MULTIVITAMIN WITH MINERALS) TABS tablet Take 1  tablet by mouth daily. 03/24/21   Marguerita Merles Latif, DO  nystatin ointment (MYCOSTATIN) Apply 1 application  topically 2 (two) times daily as needed (for rashes).    [provider]  omeprazole (PRILOSEC) 40 MG capsule Take 40 mg by mouth daily before breakfast. 01/23/20   [provider]  ondansetron (ZOFRAN) 4 MG tablet Take 1 tablet (4 mg total) by mouth every 6 (six) hours as needed for nausea. Patient not taking: Reported on 07/11/2022 03/23/21   Marguerita Merles Latif, DO  OZEMPIC, 2 MG/DOSE, 8 MG/3ML SOPN Inject 2 mg into the skin every Friday.     [provider]  PARoxetine (PAXIL) 40 MG tablet Take 1 tablet (40 mg total) by mouth every morning. 06/09/22   Arfeen, Phillips Grout, MD  polyethylene glycol (MIRALAX / GLYCOLAX) 17 g packet Take 17 g by mouth daily as needed for mild constipation. 03/23/21   Marguerita Merles Latif, DO  PRESCRIPTION MEDICATION BiPAP- At bedtime    [provider]  rOPINIRole (REQUIP) 0.25 MG tablet TAKE 3 TABLETS BY MOUTH AT BEDTIME 05/22/22   Huston Foley, MD  rosuvastatin (CRESTOR) 10 MG tablet Take 10 mg by mouth at bedtime. 09/16/21   Schmerge, Belenda Cruise, NP  traZODone (DESYREL) 100 MG tablet Take 1 tablet (100 mg total) by mouth at bedtime. 06/09/22   Arfeen, Phillips Grout, MD  TRUEPLUS INSULIN SYRINGE 29G X 1/2" 1 ML MISC 1 each by Other route daily. 07/16/18   [provider]  VENTOLIN HFA 108 (90 Base) MCG/ACT inhaler Inhale 1-2 puffs into the lungs every 4 (four) hours as needed for wheezing or shortness of breath. 07/21/18   [provider]  ziprasidone (GEODON) 80 MG capsule Take 1 capsule (80 mg total) by mouth at bedtime. 06/09/22   Arfeen, Phillips Grout, MD      Allergies    Darvocet [propoxyphene n-acetaminophen], Fluoxetine, Penicillins, and Promethazine    Review of Systems   Review of Systems  Physical Exam Updated Vital Signs BP (!) 156/74   Pulse 81   Temp 98.2 F (36.8 C) (Temporal)   Resp 18   Ht 5\' 2"  (1.575 m)   Wt 88.5 kg   SpO2 95%   BMI 35.69 kg/m  Physical Exam Vitals and nursing note reviewed. Exam conducted with a chaperone present.  Constitutional:      Appearance: Normal appearance.  HENT:     Head: Normocephalic and atraumatic.     Mouth/Throat:     Comments: Uvula is midline, speaking complete sentences.  Unable to swallow, spitting up secretions. Eyes:     General: No scleral icterus.       Right eye: No discharge.        Left eye: No discharge.     Extraocular Movements: Extraocular movements intact.     Pupils: Pupils are equal, round, and  reactive to light.  Cardiovascular:     Rate and Rhythm: Normal rate and regular rhythm.     Pulses: Normal pulses.     Heart sounds: Normal heart sounds. No murmur heard.    No friction rub. No gallop.  Pulmonary:     Effort: Pulmonary effort is normal. No respiratory distress.     Breath sounds: Normal breath sounds.  Abdominal:     General: Abdomen is flat. Bowel sounds are normal. There is no distension.     Palpations: Abdomen is soft.     Tenderness: There is no abdominal tenderness.     Comments:  Abdomen is nontender  Skin:    General: Skin is warm and dry.     Coloration: Skin is not jaundiced.  Neurological:     Mental Status: She is alert. Mental status is at baseline.     Coordination: Coordination normal.     ED Results / Procedures / Treatments   Labs (all labs ordered are listed, but only abnormal results are displayed) Labs Reviewed  BASIC METABOLIC PANEL - Abnormal; Notable for the following components:      Result Value   Glucose, Bld 384 (*)    BUN 42 (*)    Creatinine, Ser 1.47 (*)    GFR, Estimated 38 (*)    All other components within normal limits  CBC WITH DIFFERENTIAL/PLATELET - Abnormal; Notable for the following components:   WBC 11.9 (*)    Hemoglobin 10.3 (*)    HCT 32.6 (*)    MCH 25.6 (*)    Neutro Abs 9.4 (*)    All other components within normal limits    EKG None  Radiology CT Soft Tissue Neck W Contrast  Result Date: 07/14/2022 CLINICAL DATA:  Soft tissue infection suspected EXAM: CT NECK WITH CONTRAST TECHNIQUE: Multidetector CT imaging of the neck was performed using the standard protocol following the bolus administration of intravenous contrast. RADIATION DOSE REDUCTION: This exam was performed according to the departmental dose-optimization program which includes automated exposure control, adjustment of the mA and/or kV according to patient size and/or use of iterative reconstruction technique. CONTRAST:  60mL OMNIPAQUE IOHEXOL  300 MG/ML  SOLN COMPARISON:  None Available. FINDINGS: Pharynx and larynx: The epiglottis, bilateral tonsils, in the pharyngeal mucosa are normal in appearance. Salivary glands: No inflammation, mass, or stone. Thyroid: Normal. Lymph nodes: There is an 8 mm centrally hypodense lymph node in the right level 5 lymph node station (series 2, image 64, not significantly changed from prior exam Vascular: Negative. Limited intracranial: Negative. Visualized orbits: Negative. Mastoids and visualized paranasal sinuses: Complete opacification of the right maxillary sinus. Skeleton: No acute or aggressive process.  Patient is edentulous. Upper chest: There is a large amount of debris in the thoracic and distal cervical esophagus. Other: None. IMPRESSION: 1. No acute abnormality of the neck 2. Large amount of debris in the thoracic and distal cervical esophagus. Correlate for symptoms of dysphagia and consider further evaluation with a chest CT. Electronically Signed   By: Lorenza Cambridge M.D.   On: 07/14/2022 17:39   DG Neck Soft Tissue  Result Date: 07/14/2022 CLINICAL DATA:  Foreign body ingestion, choking, esophageal stricture EXAM: NECK SOFT TISSUES - 1+ VIEW COMPARISON:  None Available. FINDINGS: Tonsillar shadows appear prominent. Oropharynx and hypopharynx are nondistended limiting evaluation of the epiglottis. No retropharyngeal soft tissue swelling noted. No retained radiopaque foreign body identified within the visualized cervical soft tissues. Degenerative changes are seen within the cervical spine. IMPRESSION: 1. No retained radiopaque foreign body. 2. Prominent tonsillar shadows. Correlation with direct visualization may be helpful. Electronically Signed   By: Helyn Numbers M.D.   On: 07/14/2022 15:24   DG Chest Portable 1 View  Result Date: 07/14/2022 CLINICAL DATA:  Shortness of breath after choking episode. History of COPD and diabetes. EXAM: PORTABLE CHEST 1 VIEW COMPARISON:  Radiographs 02/28/2022 and  02/19/2022.  CT 12/03/2012. FINDINGS: 1447 hours. Suboptimal inspiration. The heart size and mediastinal contours are stable with aortic atherosclerosis. Probable minimal left basilar atelectasis. No confluent airspace disease, pleural effusion or pneumothorax. There are paraspinal osteophytes in the thoracic  spine but no acute osseous findings. Telemetry leads overlie the chest. IMPRESSION: Suboptimal inspiration with probable minimal left basilar atelectasis. No acute cardiopulmonary process. Electronically Signed   By: Carey Bullocks M.D.   On: 07/14/2022 15:18    Procedures Procedures    Medications Ordered in ED Medications  glucagon (human recombinant) (GLUCAGEN) injection 1 mg (1 mg Intravenous Given 07/14/22 1452)  iohexol (OMNIPAQUE) 300 MG/ML solution 100 mL (60 mLs Intravenous Contrast Given 07/14/22 1718)    ED Course/ Medical Decision Making/ A&P Clinical Course as of 07/14/22 1823  Mon Jul 14, 2022  1600 I consult gastroenterologist, Dr. Levora Angel or, he is recommending CT of the soft tissue unclear relation of higher in the throat versus proximal esophagus. [HS]  1739 I went to reevaluate the patient after the glucagon..  Patient cleared the hotdog and actually coughed it up.  She is feeling significantly improved and has no foreign body sensation. [HS]  1810 I think with Dr. Ewing Schlein with gastroenterology.  Given patient has cleared the food bolus, asymptomatic would recommend taking Prilosec 40 mg once in the morning once in the evening and following up with Dr. Cheryl Flash in the office this week.  Appreciate his consult. [HS]  1811 I reevaluated the patient once more.  She remains asymptomatic, oxygen is 97% on room air.  We discussed plan, return precautions. [HS]    Clinical Course User Index [HS] Theron Arista, PA-C                             Medical Decision Making Amount and/or Complexity of Data Reviewed Labs: ordered. Radiology: ordered.  Risk Prescription drug  management.   Patient presents to the emergency department due to food bolus.  Glucagon ordered, initially she was hypoxic on arrival, put on supplemental oxygen.  There is no stridor, uvula is midline no change in phonation.  I do not think it was aspirated, suspect more likely food bolus.  Plain films ordered which are not definitive.  I consulted gastroenterology as documented ED course.  Reviewed by the patient multiple times as documented ED course.  CT soft tissue notable for debris and distal esophagus.  Patient cleared food bolus independently of any intervention.  Feeling improved, will have her follow-up with gastroenterology and Prilosec ordered 40 mg twice daily until she sees GI as recommended.  Patient stable for discharge.        Final Clinical Impression(s) / ED Diagnoses Final diagnoses:  Choking due to food (regurgitated), initial encounter    Rx / DC Orders ED Discharge Orders     None         Theron Arista, PA-C 07/14/22 1823    Tanda Rockers A, DO 07/17/22 1536

## 2022-07-14 NOTE — ED Notes (Signed)
PO challenge once more. Unable to swallow one sip of water. Spits up into bag. No coughing or choking.

## 2022-07-15 ENCOUNTER — Ambulatory Visit: Payer: 59 | Admitting: Emergency Medicine

## 2022-07-18 ENCOUNTER — Ambulatory Visit (INDEPENDENT_AMBULATORY_CARE_PROVIDER_SITE_OTHER): Payer: 59 | Admitting: Emergency Medicine

## 2022-07-18 ENCOUNTER — Encounter: Payer: Self-pay | Admitting: Emergency Medicine

## 2022-07-18 VITALS — BP 124/76 | HR 80 | Temp 97.7°F | Ht 62.0 in | Wt 196.2 lb

## 2022-07-18 DIAGNOSIS — J441 Chronic obstructive pulmonary disease with (acute) exacerbation: Secondary | ICD-10-CM

## 2022-07-18 DIAGNOSIS — G4733 Obstructive sleep apnea (adult) (pediatric): Secondary | ICD-10-CM

## 2022-07-18 DIAGNOSIS — J301 Allergic rhinitis due to pollen: Secondary | ICD-10-CM | POA: Diagnosis not present

## 2022-07-18 DIAGNOSIS — K222 Esophageal obstruction: Secondary | ICD-10-CM

## 2022-07-18 NOTE — Progress Notes (Signed)
   Subjective:    Patient ID: Chelsea [name], female    DOB: 1950/09/30, 72 y.o.   MRN: [id number]  HPI  ROV 07/18/2022 --72 year old woman with a history of COPD, hypertension with diastolic CHF, obstructive sleep apnea with chronic hypoxemic respiratory failure.  She is using AutoSet BiPAP (IPAP (max 25), EPAP (min 12), PS 4).  Currently managed on Breo.  Today she reports that she is doing well w her BiPAP, gets better sleep more daytime energy. She gets up to urinate and forgets to put it back on. No cough or wheeze. She had a choking episode recently 07/14/22, had to go to ED - finally coughed it up. Was associated w hypoxemia. No residual cough since that event.  No URI, flares.   Compliance data available from 04/19/2022 through 07/17/2022 shows that she has worn the device 93% of nights, only 17% of the time is it greater than 4 hours.  Good control of her apneic events, leak present.   Review of Systems As per HPI      Objective:   Physical Exam Vitals:   07/18/22 0917  BP: 124/76  Pulse: 80  Temp: 97.7 F (36.5 C)  TempSrc: Oral  SpO2: 98%  Weight: 196 lb 3.2 oz (89 kg)  Height:  (1.575 m)    Gen: Pleasant, overweight woman, in no distress, somewhat quiet affect  ENT: No lesions,  mouth clear,  oropharynx clear, no postnasal drip, edentulous  Neck: No JVD, no stridor  Lungs: No use of accessory muscles, distant but no crackles or wheezing on normal respiration, no wheeze on forced expiration  Cardiovascular: RRR, heart sounds normal, no murmur or gallops, no peripheral edema  Musculoskeletal: No deformities, no cyanosis or clubbing  Neuro: alert, awake, non focal  Skin: Warm, no lesions or rash     Assessment & Plan:  OSA (obstructive sleep apnea) Well on BiPAP, improved compliance although she still has many nights that do not go over 4 hours.  Good clinical benefit.  She needs a slightly larger mask and is going to talk to her DME about this.  We can write  orders for this if they need it  Continue to wear your BiPAP every night while sleeping.  Try to be sure you remember to put it back on in the middle of the night after you get up to use restroom. Follow with Dr. Delton Coombes in 12 months or sooner if you have any problems.   COPD (chronic obstructive pulmonary disease) (HCC) Continue your Breo once daily.  Rinse and gargle after using. Keep albuterol available use 2 puffs if needed for shortness of breath, chest tightness, wheezing.  Allergic rhinitis Continue Flonase as you have been using it  Stricture and stenosis of esophagus Dysphagia with esophageal strictures.  She had an episode 07/14/2022.  She is going to follow with Dr. Bosie Clos.    Continue omeprazole as you have been using it  Levy Pupa, MD, PhD 07/18/2022, 9:39 AM [facility] [phone] or if no answer before 7:00PM call [phone] For any issues after 7:00PM please call eLink [phone]

## 2022-07-18 NOTE — Assessment & Plan Note (Signed)
Continue Flonase as you have been using it

## 2022-07-18 NOTE — Assessment & Plan Note (Signed)
Dysphagia with esophageal strictures.  She had an episode 07/14/2022.  She is going to follow with Dr. Bosie Clos.    Continue omeprazole as you have been using it

## 2022-07-18 NOTE — Patient Instructions (Addendum)
Continue to wear your BiPAP every night while sleeping.  Try to be sure you remember to put it back on in the middle of the night after you get up to use restroom. Continue your Breo once daily.  Rinse and gargle after using. Keep albuterol available use 2 puffs if needed for shortness of breath, chest tightness, wheezing. Continue Flonase as you have been using it Continue omeprazole as you have been using it Continue Requip each evening Follow with Dr. Delton Coombes in 12 months or sooner if you have any problems.

## 2022-07-18 NOTE — Assessment & Plan Note (Signed)
Continue your Breo once daily.  Rinse and gargle after using. Keep albuterol available use 2 puffs if needed for shortness of breath, chest tightness, wheezing.

## 2022-07-18 NOTE — Assessment & Plan Note (Signed)
Well on BiPAP, improved compliance although she still has many nights that do not go over 4 hours.  Good clinical benefit.  She needs a slightly larger mask and is going to talk to her DME about this.  We can write orders for this if they need it  Continue to wear your BiPAP every night while sleeping.  Try to be sure you remember to put it back on in the middle of the night after you get up to use restroom. Follow with Dr. Delton Coombes in 12 months or sooner if you have any problems.

## 2022-07-21 ENCOUNTER — Ambulatory Visit (INDEPENDENT_AMBULATORY_CARE_PROVIDER_SITE_OTHER): Payer: 59 | Admitting: Podiatry

## 2022-07-21 ENCOUNTER — Encounter: Payer: Self-pay | Admitting: Podiatry

## 2022-07-21 DIAGNOSIS — B351 Tinea unguium: Secondary | ICD-10-CM

## 2022-07-21 DIAGNOSIS — E119 Type 2 diabetes mellitus without complications: Secondary | ICD-10-CM

## 2022-07-21 DIAGNOSIS — M79675 Pain in left toe(s): Secondary | ICD-10-CM

## 2022-07-21 DIAGNOSIS — M79674 Pain in right toe(s): Secondary | ICD-10-CM

## 2022-07-21 NOTE — Progress Notes (Signed)
This patient returns to my office for at risk foot care.  This patient requires this care by a professional since this patient will be at risk due to having diabetes.  This patient is unable to cut nails herself since the patient cannot reach her nails.These nails are painful walking and wearing shoes.  This patient presents for at risk foot care today.  General Appearance  Alert, conversant and in no acute stress.  Vascular  Dorsalis pedis  are palpable  Bilaterally. Posterior tibial pulses are not palpable  B/L.  Capillary return is within normal limits  Bilaterally.  Cold feet.  Bilaterally. Absent hair.  Neurologic  Senn-Weinstein monofilament wire test within normal limits  bilaterally. Muscle power within normal limits bilaterally.  Nails Thick disfigured discolored nails with subungual debris  Hallux nails bilaterally. No evidence of bacterial infection or drainage bilaterally.  Orthopedic  No limitations of motion  feet .  No crepitus or effusions noted.  No bony pathology or digital deformities noted.  Skin  normotropic skin with no porokeratosis noted bilaterally.  No signs of infections or ulcers noted.     Onychomycosis  Pain in right toe  Pain in left toe.  Consent was obtained for treatment procedures.  Debridement and grinding of long thick nails with clearing of subungual debris.  No infection or ulcer.     Return office visit   4 months.       Told patient to return for periodic foot care and evaluation due to potential at risk complications.   Helane Gunther DPM

## 2022-08-15 ENCOUNTER — Other Ambulatory Visit: Payer: Self-pay | Admitting: Neurology

## 2022-08-22 ENCOUNTER — Inpatient Hospital Stay: Payer: 59 | Admitting: Nurse Practitioner

## 2022-08-22 ENCOUNTER — Inpatient Hospital Stay: Payer: 59

## 2022-08-29 ENCOUNTER — Inpatient Hospital Stay: Payer: 59 | Attending: Nurse Practitioner

## 2022-08-29 ENCOUNTER — Encounter: Payer: Self-pay | Admitting: Nurse Practitioner

## 2022-08-29 ENCOUNTER — Telehealth: Payer: Self-pay

## 2022-08-29 ENCOUNTER — Inpatient Hospital Stay (HOSPITAL_BASED_OUTPATIENT_CLINIC_OR_DEPARTMENT_OTHER): Payer: 59 | Admitting: Nurse Practitioner

## 2022-08-29 VITALS — BP 141/65 | HR 59 | Temp 97.9°F | Resp 18 | Wt 189.5 lb

## 2022-08-29 DIAGNOSIS — D509 Iron deficiency anemia, unspecified: Secondary | ICD-10-CM | POA: Diagnosis present

## 2022-08-29 DIAGNOSIS — E039 Hypothyroidism, unspecified: Secondary | ICD-10-CM | POA: Insufficient documentation

## 2022-08-29 DIAGNOSIS — J449 Chronic obstructive pulmonary disease, unspecified: Secondary | ICD-10-CM | POA: Diagnosis not present

## 2022-08-29 DIAGNOSIS — G4730 Sleep apnea, unspecified: Secondary | ICD-10-CM | POA: Diagnosis not present

## 2022-08-29 DIAGNOSIS — E1122 Type 2 diabetes mellitus with diabetic chronic kidney disease: Secondary | ICD-10-CM | POA: Insufficient documentation

## 2022-08-29 DIAGNOSIS — M1990 Unspecified osteoarthritis, unspecified site: Secondary | ICD-10-CM | POA: Insufficient documentation

## 2022-08-29 DIAGNOSIS — K5900 Constipation, unspecified: Secondary | ICD-10-CM | POA: Insufficient documentation

## 2022-08-29 DIAGNOSIS — N189 Chronic kidney disease, unspecified: Secondary | ICD-10-CM | POA: Diagnosis not present

## 2022-08-29 DIAGNOSIS — D649 Anemia, unspecified: Secondary | ICD-10-CM

## 2022-08-29 DIAGNOSIS — I129 Hypertensive chronic kidney disease with stage 1 through stage 4 chronic kidney disease, or unspecified chronic kidney disease: Secondary | ICD-10-CM | POA: Insufficient documentation

## 2022-08-29 DIAGNOSIS — E7800 Pure hypercholesterolemia, unspecified: Secondary | ICD-10-CM | POA: Diagnosis not present

## 2022-08-29 LAB — CBC WITH DIFFERENTIAL (CANCER CENTER ONLY)
Abs Immature Granulocytes: 0.01 10*3/uL (ref 0.00–0.07)
Basophils Absolute: 0 10*3/uL (ref 0.0–0.1)
Basophils Relative: 0 %
Eosinophils Absolute: 0.2 10*3/uL (ref 0.0–0.5)
Eosinophils Relative: 5 %
HCT: 34.6 % — ABNORMAL LOW (ref 36.0–46.0)
Hemoglobin: 11 g/dL — ABNORMAL LOW (ref 12.0–15.0)
Immature Granulocytes: 0 %
Lymphocytes Relative: 25 %
Lymphs Abs: 1.3 10*3/uL (ref 0.7–4.0)
MCH: 26.4 pg (ref 26.0–34.0)
MCHC: 31.8 g/dL (ref 30.0–36.0)
MCV: 83 fL (ref 80.0–100.0)
Monocytes Absolute: 0.5 10*3/uL (ref 0.1–1.0)
Monocytes Relative: 10 %
Neutro Abs: 3 10*3/uL (ref 1.7–7.7)
Neutrophils Relative %: 60 %
Platelet Count: 213 10*3/uL (ref 150–400)
RBC: 4.17 MIL/uL (ref 3.87–5.11)
RDW: 15.6 % — ABNORMAL HIGH (ref 11.5–15.5)
WBC Count: 4.9 10*3/uL (ref 4.0–10.5)
nRBC: 0 % (ref 0.0–0.2)

## 2022-08-29 LAB — FERRITIN: Ferritin: 13 ng/mL (ref 11–307)

## 2022-08-29 NOTE — Progress Notes (Signed)
  [facility] OFFICE PROGRESS NOTE   Diagnosis: Iron deficiency anemia  INTERVAL HISTORY:   Chelsea [name] returns as scheduled.  She has been taking oral iron twice daily since her last office visit.  She notes mild constipation relieved with MiraLAX.  No nausea or vomiting.  She denies any bleeding.  She reports undergoing an upper endoscopy within the past 2 months.  Objective:  Vital signs in last 24 hours:  Blood pressure (!) 141/65, pulse (!) 59, temperature 97.9 F (36.6 C), temperature source Tympanic, resp. rate 18, weight 189 lb 8 oz (86 kg), SpO2 100 %.    Resp: Lungs clear bilaterally. Cardio: Regular rate and rhythm. GI: Abdomen soft and nontender.  No hepatosplenomegaly. Vascular: No leg edema.   Lab Results:  Lab Results  Component Value Date   WBC 4.9 08/29/2022   HGB 11.0 (L) 08/29/2022   HCT 34.6 (L) 08/29/2022   MCV 83.0 08/29/2022   PLT 213 08/29/2022   NEUTROABS 3.0 08/29/2022    Imaging:  No results found.  Medications: I have reviewed the patient's current medications.  Assessment/Plan: Iron deficiency anemia Admission with GI bleed July 2022, December 2022 09/28/2020 EGD-esophageal plaques consistent with candidiasis, gastritis 09/29/2020 colonoscopy-internal hemorrhoids, 2 small polyps in the mid transverse colon and distal ascending colon 06/05/2022 capsule endoscopy-normal Chronic kidney disease COPD Sleep apnea Depression Schizophrenia Diabetes Hypertension Hypothyroid Elevated cholesterol Osteoarthritis  Disposition: Chelsea [name] appears stable.  We reviewed the CBC from today.  Hemoglobin with mild improvement.  We will follow-up on the ferritin.  She will continue oral iron twice daily.  No clear source of GI blood loss has been identified.  She continues follow-up with Dr. Bosie Clos.  She will return for lab and follow-up in 8 weeks.   Lonna Cobb ANP/GNP-BC   08/29/2022  10:09 AM

## 2022-08-29 NOTE — Telephone Encounter (Signed)
I called [facility] and request her last endoscopy

## 2022-09-08 ENCOUNTER — Telehealth (HOSPITAL_BASED_OUTPATIENT_CLINIC_OR_DEPARTMENT_OTHER): Payer: 59 | Admitting: Psychiatry

## 2022-09-08 ENCOUNTER — Encounter (HOSPITAL_COMMUNITY): Payer: Self-pay | Admitting: Psychiatry

## 2022-09-08 VITALS — Wt 189.0 lb

## 2022-09-08 DIAGNOSIS — F200 Paranoid schizophrenia: Secondary | ICD-10-CM

## 2022-09-08 DIAGNOSIS — F411 Generalized anxiety disorder: Secondary | ICD-10-CM

## 2022-09-08 MED ORDER — TRAZODONE HCL 100 MG PO TABS: 100.0000 mg | ORAL_TABLET | Freq: Every day | ORAL | 0 refills | Status: DC

## 2022-09-08 MED ORDER — CLONAZEPAM 1 MG PO TABS: 1.0000 mg | ORAL_TABLET | Freq: Every day | ORAL | 2 refills | Status: DC

## 2022-09-08 MED ORDER — PAROXETINE HCL 40 MG PO TABS: 40.0000 mg | ORAL_TABLET | Freq: Every morning | ORAL | 0 refills | Status: DC

## 2022-09-08 MED ORDER — ZIPRASIDONE HCL 80 MG PO CAPS: 80.0000 mg | ORAL_CAPSULE | Freq: Every day | ORAL | 0 refills | Status: DC

## 2022-09-08 NOTE — Progress Notes (Signed)
[facility] MD Virtual Progress Note   Patient Location: Home Provider Location: Home Office  I connect with patient by telephone and verified that I am speaking with correct person by using two identifiers. I discussed the limitations of evaluation and management by telemedicine and the availability of in person appointments. I also discussed with the patient that there may be a patient responsible charge related to this service. The patient expressed understanding and agreed to proceed.  Chelsea [name] [id number] 72 y.o.  09/08/2022 10:20 AM  History of Present Illness:  Patient is evaluated by phone session.  She missed a phone call but then later call us back like to have a session on the phone.  Patient cannot do video because she does not know how to do the video camera.  She was in the emergency room last month after choking on food.  At that time blood work shows very high blood sugar, BUN 42 and creatinine 1.47.  She also have anemia and she is seeing hematology.  Patient told she is getting primary care doctor at her home but has not done hemoglobin A1c in a while.  She is taking Klonopin, Geodon, trazodone and Paxil.  She denies any mania, psychosis, paranoia, hallucination or any suicidal thoughts but admitted sometimes forgetful, decreased attention, concentration.  She uses BiPAP and able to get 6 to 7 hours sleep.  She does not drive but does go outside with her sister for grocery stores.  She is seeing Dr. Lacy Duverney for her kidney function.  She denies any crying spells.  She is on Ozempic and her weight is stable.  She like to keep the current medicine as does not want to change.  She was recommended to see GI and after the appointment she was told to eat small portions and avoid taking big chunks of food.  Past Psychiatric History: H/O inpatient at [facility] and [facility] for mania and psychosis.  No h/o suicidal attempt.     Outpatient Encounter  Medications as of 09/08/2022  Medication Sig   acetaminophen (TYLENOL) 325 MG tablet Take 650 mg by mouth every 6 (six) hours as needed for moderate pain.   albuterol (PROVENTIL) (2.5 MG/3ML) 0.083% nebulizer solution Take 3 mLs (2.5 mg total) by nebulization every 6 (six) hours as needed for wheezing or shortness of breath.   amLODipine (NORVASC) 2.5 MG tablet Take 2.5 mg by mouth daily.   bumetanide (BUMEX) 1 MG tablet Take 1 tablet (1 mg total) by mouth daily.   Cholecalciferol (VITAMIN D3) 5000 units TABS Take 5,000 Units by mouth daily.   clonazePAM (KLONOPIN) 1 MG tablet Take 1 tablet (1 mg total) by mouth at bedtime.   diclofenac sodium (VOLTAREN) 1 % GEL Apply 2 g topically 4 (four) times daily as needed (pain).   Ensure Max Protein (ENSURE MAX PROTEIN) LIQD Take 330 mLs (11 oz total) by mouth 2 (two) times daily.   ferrous gluconate (FERGON) 324 MG tablet Take 1 tablet (324 mg total) by mouth 2 (two) times daily with a meal.   fluticasone (FLONASE) 50 MCG/ACT nasal spray Place 2 sprays into both nostrils daily.   fluticasone furoate-vilanterol (BREO ELLIPTA) 200-25 MCG/INH AEPB Inhale 1 puff into the lungs every morning.   insulin aspart (NOVOLOG FLEXPEN) 100 UNIT/ML FlexPen Inject 2-12 Units into the skin 3 (three) times daily with meals. Sliding scale   LEVEMIR 100 UNIT/ML injection Inject 30 Units into the skin at bedtime.   levothyroxine (SYNTHROID)  100 MCG tablet Take 1 tablet (100 mcg total) by mouth daily before breakfast.   metFORMIN (GLUCOPHAGE) 500 MG tablet Take 500 mg by mouth in the morning and at bedtime.   metoprolol succinate (TOPROL-XL) 100 MG 24 hr tablet Take 100 mg by mouth in the morning. Take with or immediately following a meal.   metoprolol tartrate (LOPRESSOR) 25 MG tablet Take 0.5 tablets (12.5 mg total) by mouth 2 (two) times daily.   montelukast (SINGULAIR) 10 MG tablet Take 10 mg by mouth daily.   Multiple Vitamin (MULTIVITAMIN WITH MINERALS) TABS tablet Take  1 tablet by mouth daily.   nystatin ointment (MYCOSTATIN) Apply 1 application  topically 2 (two) times daily as needed (for rashes).   omeprazole (PRILOSEC) 40 MG capsule Take 40 mg by mouth daily before breakfast.   ondansetron (ZOFRAN) 4 MG tablet Take 1 tablet (4 mg total) by mouth every 6 (six) hours as needed for nausea.   OZEMPIC, 2 MG/DOSE, 8 MG/3ML SOPN Inject 2 mg into the skin every Friday.   PARoxetine (PAXIL) 40 MG tablet Take 1 tablet (40 mg total) by mouth every morning.   polyethylene glycol (MIRALAX / GLYCOLAX) 17 g packet Take 17 g by mouth daily as needed for mild constipation.   PRESCRIPTION MEDICATION BiPAP- At bedtime   rOPINIRole (REQUIP) 0.25 MG tablet TAKE 3 TABLETS BY MOUTH AT BEDTIME   rosuvastatin (CRESTOR) 10 MG tablet Take 10 mg by mouth at bedtime.   traZODone (DESYREL) 100 MG tablet Take 1 tablet (100 mg total) by mouth at bedtime.   TRUEPLUS INSULIN SYRINGE 29G X 1/2" 1 ML MISC 1 each by Other route daily.   VENTOLIN HFA 108 (90 Base) MCG/ACT inhaler Inhale 1-2 puffs into the lungs every 4 (four) hours as needed for wheezing or shortness of breath.   ziprasidone (GEODON) 80 MG capsule Take 1 capsule (80 mg total) by mouth at bedtime.   No facility-administered encounter medications on file as of 09/08/2022.    Recent Results (from the past 2160 hour(s))  Save Smear for Provider Slide Review     Status: None   Collection Time: 07/11/22 10:44 AM  Result Value Ref Range   Smear Review SMEAR STAINED AND AVAILABLE FOR REVIEW     Comment: Performed at [facility], [address] [place], [place] [address]  CBC with Differential (Cancer Center Only)     Status: Abnormal   Collection Time: 07/11/22 10:44 AM  Result Value Ref Range   WBC Count 6.3 4.0 - 10.5 K/uL   RBC 4.11 3.87 - 5.11 MIL/uL   Hemoglobin 10.4 (L) 12.0 - 15.0 g/dL   HCT 60.4 (L) 54.0 - 98.1 %   MCV 81.0 80.0 - 100.0 fL   MCH 25.3 (L) 26.0 - 34.0 pg   MCHC 31.2 30.0 - 36.0  g/dL   RDW 19.1 47.8 - 29.5 %   Platelet Count 216 150 - 400 K/uL   nRBC 0.0 0.0 - 0.2 %   Neutrophils Relative % 67 %   Neutro Abs 4.2 1.7 - 7.7 K/uL   Lymphocytes Relative 21 %   Lymphs Abs 1.3 0.7 - 4.0 K/uL   Monocytes Relative 7 %   Monocytes Absolute 0.5 0.1 - 1.0 K/uL   Eosinophils Relative 5 %   Eosinophils Absolute 0.3 0.0 - 0.5 K/uL   Basophils Relative 0 %   Basophils Absolute 0.0 0.0 - 0.1 K/uL   Immature Granulocytes 0 %   Abs Immature Granulocytes  0.01 0.00 - 0.07 K/uL    Comment: Performed at [facility], [address] [place], [place] [address]  Ferritin     Status: None   Collection Time: 07/11/22 10:45 AM  Result Value Ref Range   Ferritin 12 11 - 307 ng/mL    Comment: Performed at [facility], [address] [place], [place] [address]  Basic metabolic panel     Status: Abnormal   Collection Time: 07/14/22  4:13 PM  Result Value Ref Range   Sodium 135 135 - 145 mmol/L   Potassium 4.4 3.5 - 5.1 mmol/L   Chloride 100 98 - 111 mmol/L   CO2 26 22 - 32 mmol/L   Glucose, Bld 384 (H) 70 - 99 mg/dL    Comment: Glucose reference range applies only to samples taken after fasting for at least 8 hours.   BUN 42 (H) 8 - 23 mg/dL   Creatinine, Ser 0.98 (H) 0.44 - 1.00 mg/dL   Calcium 9.3 8.9 - 11.9 mg/dL   GFR, Estimated 38 (L) >60 mL/min    Comment: (NOTE) Calculated using the CKD-EPI Creatinine Equation (2021)    Anion gap 9 5 - 15    Comment: Performed at [facility], [address], [place], [place] [address]  CBC with Differential     Status: Abnormal   Collection Time: 07/14/22  4:13 PM  Result Value Ref Range   WBC 11.9 (H) 4.0 - 10.5 K/uL   RBC 4.02 3.87 - 5.11 MIL/uL   Hemoglobin 10.3 (L) 12.0 - 15.0 g/dL   HCT 95.6 (L) 21.3 - 08.6 %   MCV 81.1 80.0 - 100.0 fL   MCH 25.6 (L) 26.0 - 34.0 pg   MCHC 31.6 30.0 - 36.0 g/dL   RDW 57.8 46.9 - 62.9 %   Platelets 174 150 - 400 K/uL   nRBC 0.0 0.0  - 0.2 %   Neutrophils Relative % 79 %   Neutro Abs 9.4 (H) 1.7 - 7.7 K/uL   Lymphocytes Relative 12 %   Lymphs Abs 1.4 0.7 - 4.0 K/uL   Monocytes Relative 7 %   Monocytes Absolute 0.9 0.1 - 1.0 K/uL   Eosinophils Relative 1 %   Eosinophils Absolute 0.2 0.0 - 0.5 K/uL   Basophils Relative 0 %   Basophils Absolute 0.0 0.0 - 0.1 K/uL   Immature Granulocytes 1 %   Abs Immature Granulocytes 0.06 0.00 - 0.07 K/uL    Comment: Performed at [facility], [address], [place], [place] [address]  Ferritin     Status: None   Collection Time: 08/29/22  9:03 AM  Result Value Ref Range   Ferritin 13 11 - 307 ng/mL    Comment: Performed at [facility], [address] [place], [place], [place] [address]  CBC with Differential (Cancer Center Only)     Status: Abnormal   Collection Time: 08/29/22  9:04 AM  Result Value Ref Range   WBC Count 4.9 4.0 - 10.5 K/uL   RBC 4.17 3.87 - 5.11 MIL/uL   Hemoglobin 11.0 (L) 12.0 - 15.0 g/dL   HCT 10.2 (L) 72.5 - 36.6 %   MCV 83.0 80.0 - 100.0 fL   MCH 26.4 26.0 - 34.0 pg   MCHC 31.8 30.0 - 36.0 g/dL   RDW 44.0 (H) 34.7 - 42.5 %   Platelet Count 213 150 - 400 K/uL   nRBC 0.0 0.0 - 0.2 %   Neutrophils Relative % 60 %  Neutro Abs 3.0 1.7 - 7.7 K/uL   Lymphocytes Relative 25 %   Lymphs Abs 1.3 0.7 - 4.0 K/uL   Monocytes Relative 10 %   Monocytes Absolute 0.5 0.1 - 1.0 K/uL   Eosinophils Relative 5 %   Eosinophils Absolute 0.2 0.0 - 0.5 K/uL   Basophils Relative 0 %   Basophils Absolute 0.0 0.0 - 0.1 K/uL   Immature Granulocytes 0 %   Abs Immature Granulocytes 0.01 0.00 - 0.07 K/uL    Comment: Performed at [facility], [address] [place], [place] [address]     Psychiatric Specialty Exam: Physical Exam  Review of Systems  Psychiatric/Behavioral:  Positive for decreased concentration. The patient is nervous/anxious.     Weight 189 lb (85.7 kg).There is no height or weight on file to  calculate BMI.  General Appearance: NA  Eye Contact:  NA  Speech:  Slow  Volume:  Decreased  Mood:   I am tired  Affect:  NA  Thought Process:  Descriptions of Associations: Intact  Orientation:  Full (Time, Place, and Person)  Thought Content:  WDL  Suicidal Thoughts:  No  Homicidal Thoughts:  No  Memory:  Immediate;   Fair Recent;   Fair Remote;   Fair  Judgement:  Fair  Insight:  Fair  Psychomotor Activity:  Decreased  Concentration:  Concentration: Fair and Attention Span: Fair  Recall:  [employer] of Knowledge:  Fair  Language:  Fair  Akathisia:  No  Handed:  Right  AIMS (if indicated):     Assets:  Communication Skills Desire for Improvement Housing Social Support  ADL's:  Impaired  Cognition:  WNL  Sleep:  ok, using BIPAP     Assessment/Plan: Paranoid schizophrenia (HCC) - Plan: clonazePAM (KLONOPIN) 1 MG tablet, traZODone (DESYREL) 100 MG tablet, ziprasidone (GEODON) 80 MG capsule, PARoxetine (PAXIL) 40 MG tablet  Generalized anxiety disorder - Plan: clonazePAM (KLONOPIN) 1 MG tablet  I reviewed blood work results.  Discussed high blood sugar, BUN and creatinine from the past.  Encouraged to discuss with primary care and kidney doctor about the recent labs.  Patient has no more dizziness or confusion but noticed gradual decline in memory and concentration.  We did talk about cutting down the Klonopin but patient does not want to change the dose.  She feels comfortable as her anxiety and paranoia is a stable.  Continue Paxil 40 mg daily, Geodon 80 mg at bedtime, Klonopin 1 mg at bedtime and Paxil 40 mg daily.  Recommended to call us back if she has any question or any concern.  We will offer in person visit next time since patient cannot do video sessions.  Follow-up in 3 months.   Follow Up Instructions:     I discussed the assessment and treatment plan with the patient. The patient was provided an opportunity to ask questions and all were answered. The patient  agreed with the plan and demonstrated an understanding of the instructions.   The patient was advised to call back or seek an in-person evaluation if the symptoms worsen or if the condition fails to improve as anticipated.    Collaboration of Care: Other provider involved in patient's care AEB notes are available in epic to review.  Patient/Guardian was advised Release of Information must be obtained prior to any record release in order to collaborate their care with an outside provider. Patient/Guardian was advised if they have not already done so to contact the registration department to  sign all necessary forms in order for [place] to release information regarding their care.   Consent: Patient/Guardian gives verbal consent for treatment and assignment of benefits for services provided during this visit. Patient/Guardian expressed understanding and agreed to proceed.     I provided 28 minutes of non face to face time during this encounter.  Note: This document was prepared by [employer] voice dictation technology and any errors that results from this process are unintentional.    Cleotis Nipper, MD 09/08/2022

## 2022-10-06 ENCOUNTER — Encounter: Payer: Self-pay | Admitting: Oncology

## 2022-10-14 ENCOUNTER — Ambulatory Visit (INDEPENDENT_AMBULATORY_CARE_PROVIDER_SITE_OTHER): Payer: 59 | Admitting: Adult Health

## 2022-10-14 ENCOUNTER — Encounter: Payer: Self-pay | Admitting: Adult Health

## 2022-10-14 VITALS — BP 104/68 | HR 70 | Ht 62.0 in | Wt 189.0 lb

## 2022-10-14 DIAGNOSIS — R519 Headache, unspecified: Secondary | ICD-10-CM

## 2022-10-14 DIAGNOSIS — R569 Unspecified convulsions: Secondary | ICD-10-CM

## 2022-10-14 DIAGNOSIS — G2581 Restless legs syndrome: Secondary | ICD-10-CM | POA: Diagnosis not present

## 2022-10-14 NOTE — Patient Instructions (Signed)
Your Plan:  Continue Requip  Let us know if you have any seizure like events Follow with pulmonologist- possibly test for low oxygen at night- could be causing you to wake up with headaches.  If your symptoms worsen or you develop new symptoms please let us know.       Thank you for coming to see [place] at [facility]. I hope we have been able to provide you high quality care today.  You may receive a patient satisfaction survey over the next few weeks. We would appreciate your feedback and comments so that we may continue to improve ourselves and the health of our patients.

## 2022-10-14 NOTE — Progress Notes (Signed)
PATIENT: SANYIAH KANZLER DOB: [birth year]  REASON FOR VISIT: follow up HISTORY FROM: patient  Chief Complaint  Patient presents with   RM 4    Here with her sister for RLS follow-up. She states her symptoms are ok right now. She denies any seizures or any further episodes. She states she has been having headaches. She has one today and states it starts in the left temple area. It feels like someone shocked her, a sharp stabbing pain. Her sister states she has been complaining of headaches at night. She takes Ibuprofen.     HISTORY OF PRESENT ILLNESS: Today 10/14/22:  SARAIYA KOZMA is a 72 y.o. female with a history of RLS, possible seizure event. Returns today for follow-up. Continue on requip 0.25 mg- 3 tablets at bedtime. This works well.  Denies any additional seizure-like events.  Reports that she has been getting headaches that wake her out of her sleep the last couple of months.  Reports that it started off 2-3 times a week now its only once a week.. Thought it was due to high BP but BP is better since they increased the medicine. Headache occur in the temporal regions. Denies light or noise sensitivty. No nausea and vomiting. No other symptoms- numbess, tingling, wekaness. No changes with vision. Takes ibuprofen and headache will resolve.  She does follow with pulmonology.  She is currently on BiPAP therapy.  States that she is compliant with her BiPAP machine.  In the past she has stated that she was using supplemental oxygen at night she reports that she is no longer using this?  I reviewed Dr. Kavin Leech note from March 2024.  There was many nights the patient did not use BiPAP more than 4 hours.   03/18/2022 [facility]): She reports feeling better, she feels back to baseline.  Sister reports that she has been in her usual state of health, her psychiatrist was not in favor of adding a second dose of clonazepam in the morning and they have just been utilizing the evening dose of  clonazepam at the usual dose.  They have stopped the additional 0.5 mg of clonazepam in the morning.  She has not had any recurrent seizure events or seizure-like changes, no one-sided weakness or numbness or tingling or droopy face or slurring of speech.  Event at the end of November was overnight, patient was in her room sleeping in the recliner and they heard a noise, she was on the floor, she had toppled the chair.  She was confused, her speech was difficult to understand, sister called EMS.     She was recently hospitalized from 02/27/2022 through 03/01/2022 for concern for seizure.  The patient presented to the emergency room with confusion and a fall.  I reviewed the hospital records.  Blood work showed normal TSH, RPR reactive.  Methylmalonic acid 155, in the normal range, vitamin B12 elevated at 1901, ammonia level less than 10.  She had an EEG on 02/28/2022 which showed: IMPRESSION: This study is suggestive of cortical dysfunction arising from left hemisphere, maximal left temporal region likely secondary to underlying structural abnormality, post-ictal state. No seizures or epileptiform discharges were seen throughout the recording.  She was placed on long-term EEG monitoring.  Evaluation on 03/01/2022 showed:  IMPRESSION: This study is suggestive of non-specific cortical dysfunction arising from left temporal region. No seizures or epileptiform discharges were seen throughout the recording.   EEG appears to be improving compared to previous day.  She had a brain MRI without contrast on 02/28/2022 and I reviewed the results: Impression: No acute intracranial abnormality.   She had a head CT without contrast and cervical spine CT without contrast on 02/28/2022 and I reviewed the results: Impression: No acute intracranial abnormality.  No acute fracture or static subluxation of the cervical spine.   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  She has not had  any seizures since childhood, she was on Dilantin in the past which caused major side effects including her losing all her teeth as I understand. Patient does not hydrate very well, she does not like to drink water, she drinks tea, she likes to add artificial sweetener. She uses a BiPAP at night. She does not take any Wellbutrin.  12/04/20: Ms. Pressey is a 72 year old female with a history of restless leg syndrome.  She returns today for follow-up.  She states that Requip 0.75 mg at bedtime has been working well for her.  She is here today with her sister.  She reports that the patient is fatigued throughout the day.  She states that she sleeps during the day a lot.  Patient previously had a split-night study but sleep efficacy was impaired so therefore the study was limited.  However the patient BMI has decreased since that study.  Sleep apnea is not likely however the patient sister would like to repeat the test if possible.  11/10/19: Ms. Tardiff is a 72 year old female with a history of restless leg syndrome.  She returns today for follow-up.  Overall she has been doing well.  Reports that she continues to take Requip 0.75 milligrams at bedtime.  She states that this is working well for her.  She denies any trouble sleeping.  She returns today for an evaluation.  HISTORY 11/09/18:   Ms. Ryle is a 72 year old female with a history of restless leg syndrome, nocturnal hypoxemia and daytime sleepiness.  She returns today for follow-up.  She states that her restless legs are controlled with Requip.  She continues to take Requip .75 mg at bedtime.  She reports that she still does not sleep very well at night.  She states that she does sleep a lot during the day.  She typically takes her nighttime meds around 1015.  This includes Requip, Klonopin, Geodon and trazodone.  She states that she typically stays up watching television then will try to go to bed until 1:30 AM.  She also reports that she drinks  caffeine throughout the day.  She usually sleeps with the television on.  She continues to use supplemental O2 at bedtime.  She returns today for follow-up.   REVIEW OF SYSTEMS: Out of a complete 14 system review of symptoms, the patient complains only of the following symptoms, and all other reviewed systems are negative.  ESS 10 FSS 47  ALLERGIES: Allergies  Allergen Reactions   Darvocet [Propoxyphene N-Acetaminophen] Anaphylaxis    Can take plain tylenol   Fluoxetine Other (See Comments)    Hallucinations    Penicillins Rash   Promethazine Hives    HOME MEDICATIONS: Outpatient Medications Prior to Visit  Medication Sig Dispense Refill   acetaminophen (TYLENOL) 325 MG tablet Take 650 mg by mouth every 6 (six) hours as needed for moderate pain.     albuterol (PROVENTIL) (2.5 MG/3ML) 0.083% nebulizer solution Take 3 mLs (2.5 mg total) by nebulization every 6 (six) hours as needed for wheezing or shortness of breath. 75 mL  12   amLODipine (NORVASC) 10 MG tablet Take 10 mg by mouth daily.     Cholecalciferol (VITAMIN D3) 5000 units TABS Take 5,000 Units by mouth daily.     clonazePAM (KLONOPIN) 1 MG tablet Take 1 tablet (1 mg total) by mouth at bedtime. 30 tablet 2   diclofenac sodium (VOLTAREN) 1 % GEL Apply 2 g topically 4 (four) times daily as needed (pain).     Ensure Max Protein (ENSURE MAX PROTEIN) LIQD Take 330 mLs (11 oz total) by mouth 2 (two) times daily. 3300 mL 0   ferrous gluconate (FERGON) 324 MG tablet Take 1 tablet (324 mg total) by mouth 2 (two) times daily with a meal. 60 tablet 3   fluticasone (FLONASE) 50 MCG/ACT nasal spray Place 2 sprays into both nostrils daily. As needed     fluticasone furoate-vilanterol (BREO ELLIPTA) 200-25 MCG/INH AEPB Inhale 1 puff into the lungs every morning.     insulin aspart (NOVOLOG FLEXPEN) 100 UNIT/ML FlexPen Inject 2-12 Units into the skin 3 (three) times daily with meals. Sliding scale     LEVEMIR 100 UNIT/ML injection Inject 30  Units into the skin at bedtime.     levothyroxine (SYNTHROID) 100 MCG tablet Take 1 tablet (100 mcg total) by mouth daily before breakfast. 30 tablet 3   metFORMIN (GLUCOPHAGE) 500 MG tablet Take 500 mg by mouth in the morning and at bedtime.     metoprolol (TOPROL-XL) 200 MG 24 hr tablet Take 200 mg by mouth daily.     montelukast (SINGULAIR) 10 MG tablet Take 10 mg by mouth daily.     Multiple Vitamin (MULTIVITAMIN WITH MINERALS) TABS tablet Take 1 tablet by mouth daily. 30 tablet 0   nystatin ointment (MYCOSTATIN) Apply 1 application  topically 2 (two) times daily as needed (for rashes).     omeprazole (PRILOSEC) 40 MG capsule Take 40 mg by mouth daily before breakfast.     ondansetron (ZOFRAN) 4 MG tablet Take 1 tablet (4 mg total) by mouth every 6 (six) hours as needed for nausea. 20 tablet 0   OZEMPIC, 2 MG/DOSE, 8 MG/3ML SOPN Inject 2 mg into the skin every Friday.     PARoxetine (PAXIL) 40 MG tablet Take 1 tablet (40 mg total) by mouth every morning. 90 tablet 0   polyethylene glycol (MIRALAX / GLYCOLAX) 17 g packet Take 17 g by mouth daily as needed for mild constipation. 14 each 0   rOPINIRole (REQUIP) 0.25 MG tablet TAKE 3 TABLETS BY MOUTH AT BEDTIME 90 tablet 2   rosuvastatin (CRESTOR) 10 MG tablet Take 10 mg by mouth at bedtime.     traZODone (DESYREL) 100 MG tablet Take 1 tablet (100 mg total) by mouth at bedtime. 90 tablet 0   TRUEPLUS INSULIN SYRINGE 29G X 1/2" 1 ML MISC 1 each by Other route daily.     VENTOLIN HFA 108 (90 Base) MCG/ACT inhaler Inhale 1-2 puffs into the lungs every 4 (four) hours as needed for wheezing or shortness of breath.     ziprasidone (GEODON) 80 MG capsule Take 1 capsule (80 mg total) by mouth at bedtime. 90 capsule 0   bumetanide (BUMEX) 1 MG tablet Take 1 tablet (1 mg total) by mouth daily. (Patient not taking: Reported on 10/14/2022) 30 tablet 0   amLODipine (NORVASC) 2.5 MG tablet Take 2.5 mg by mouth daily.     metoprolol succinate (TOPROL-XL) 100 MG  24 hr tablet Take 100 mg by mouth in the morning. Take  with or immediately following a meal.     metoprolol tartrate (LOPRESSOR) 25 MG tablet Take 0.5 tablets (12.5 mg total) by mouth 2 (two) times daily. (Patient not taking: Reported on 10/14/2022) 60 tablet 0   No facility-administered medications prior to visit.    PAST MEDICAL HISTORY: Past Medical History:  Diagnosis Date   Anxiety    Arthritis    Bipolar disorder (HCC)    COPD (chronic obstructive pulmonary disease) (HCC)    Diabetes mellitus type II    Esophageal stricture    moderate distal   GERD (gastroesophageal reflux disease)    Heart murmur    "HAD AN ECHO YEARS AGO- NOTHING TO BE CONCERNED ABOUT"   Hyperlipemia    Hypertension    Hypothyroidism    Mental disorder    Bipolar   Obesity    Oxygen desaturation during sleep    wears 2 liters at bedtime    Schizophrenia (HCC)    patient reports that she is unaware of this   Shortness of breath    with a little activy   Sleep apnea    wears 2 liters of oxygen at bedtime instead of CPAP    PAST SURGICAL HISTORY: Past Surgical History:  Procedure Laterality Date   BALLOON DILATION N/A 06/10/2016   Procedure: BALLOON DILATION;  Surgeon: Charlott Rakes, MD;  Location: [facility] ENDOSCOPY;  Service: Endoscopy;  Laterality: N/A;   BREAST EXCISIONAL BIOPSY Left 05/10/2012   benign   CHOLECYSTECTOMY     COLONOSCOPY WITH PROPOFOL N/A 01/30/2017   Procedure: COLONOSCOPY WITH PROPOFOL;  Surgeon: Charlott Rakes, MD;  Location: [facility] ENDOSCOPY;  Service: Endoscopy;  Laterality: N/A;   COLONOSCOPY WITH PROPOFOL N/A 09/29/2020   Procedure: COLONOSCOPY WITH PROPOFOL;  Surgeon: Vida Rigger, MD;  Location: [facility] ENDOSCOPY;  Service: Endoscopy;  Laterality: N/A;   ESOPHAGEAL MANOMETRY N/A 12/19/2020   Procedure: ESOPHAGEAL MANOMETRY (EM);  Surgeon: Charlott Rakes, MD;  Location: [facility] ENDOSCOPY;  Service: Endoscopy;  Laterality: N/A;   ESOPHAGOGASTRODUODENOSCOPY N/A 06/03/2016   Procedure:  ESOPHAGOGASTRODUODENOSCOPY (EGD);  Surgeon: Charlott Rakes, MD;  Location: [facility] ENDOSCOPY;  Service: Endoscopy;  Laterality: N/A;   ESOPHAGOGASTRODUODENOSCOPY N/A 06/10/2016   Procedure: ESOPHAGOGASTRODUODENOSCOPY (EGD);  Surgeon: Charlott Rakes, MD;  Location: [facility] ENDOSCOPY;  Service: Endoscopy;  Laterality: N/A;   ESOPHAGOGASTRODUODENOSCOPY (EGD) WITH PROPOFOL N/A 09/28/2020   Procedure: ESOPHAGOGASTRODUODENOSCOPY (EGD) WITH PROPOFOL;  Surgeon: Willis Modena, MD;  Location: [facility] ENDOSCOPY;  Service: Endoscopy;  Laterality: N/A;   EYE SURGERY     PARTIAL MASTECTOMY WITH NEEDLE LOCALIZATION Left 05/10/2012   Procedure: PARTIAL MASTECTOMY WITH NEEDLE LOCALIZATION;  Surgeon: Ernestene Mention, MD;  Location: [facility] OR;  Service: General;  Laterality: Left;   POLYPECTOMY  09/29/2020   Procedure: POLYPECTOMY;  Surgeon: Vida Rigger, MD;  Location: [facility] ENDOSCOPY;  Service: Endoscopy;;   TONSILLECTOMY      FAMILY HISTORY: Family History  Problem Relation Age of Onset   Alzheimer's disease Mother    Alcohol abuse Father    Stroke Father    Sleep apnea Sister    Restless legs syndrome Neg Hx    Seizures Neg Hx     SOCIAL HISTORY: Social History   Socioeconomic History   Marital status: Divorced    Spouse name: Not on file   Number of children: 0   Years of education: HS   Highest education level: Not on file  Occupational History   Occupation: Retired   Tobacco Use   Smoking status: Former    Current  packs/day: 0.00    Average packs/day: 3.0 packs/day for 29.0 years (87.0 ttl pk-yrs)    Types: Cigarettes    Start date: 05/03/1964    Quit date: 05/03/1993    Years since quitting: 29.4   Smokeless tobacco: Never  Vaping Use   Vaping status: Never Used  Substance and Sexual Activity   Alcohol use: No    Alcohol/week: 0.0 standard drinks of alcohol   Drug use: No   Sexual activity: Not Currently  Other Topics Concern   Not on file  Social History Narrative   Drinks 2 cups of coffee a day    Social Determinants of Health   Financial Resource Strain: Not on file  Food Insecurity: Not on file  Transportation Needs: Not on file  Physical Activity: Not on file  Stress: Not on file  Social Connections: Not on file  Intimate Partner Violence: Not on file      PHYSICAL EXAM  Vitals:   10/14/22 1037  BP: 104/68  Pulse: 70  Weight: 189 lb (85.7 kg)  Height: 5\' 2"  (1.575 m)    Body mass index is 34.57 kg/m.  Generalized: Well developed, in no acute distress   Neurological examination  Mentation: Alert oriented to time, place, history taking. Follows all commands speech and language fluent Cranial nerve II-XII:  Extraocular movements were full, visual field were full on confrontational test.  Motor: The motor testing reveals 5 over 5 strength of all 4 extremities. Good symmetric motor tone is noted throughout.  Sensory: Sensory testing is intact to soft touch on all 4 extremities. No evidence of extinction is noted.  Coordination: Cerebellar testing reveals good finger-nose-finger and heel-to-shin bilaterally.  Gait and station: Gait is normal.     DIAGNOSTIC DATA (LABS, IMAGING, TESTING) - I reviewed patient records, labs, notes, testing and imaging myself where available.  Lab Results  Component Value Date   WBC 4.9 08/29/2022   HGB 11.0 (L) 08/29/2022   HCT 34.6 (L) 08/29/2022   MCV 83.0 08/29/2022   PLT 213 08/29/2022      Component Value Date/Time   NA 135 07/14/2022 1613   K 4.4 07/14/2022 1613   CL 100 07/14/2022 1613   CO2 26 07/14/2022 1613   GLUCOSE 384 (H) 07/14/2022 1613   BUN 42 (H) 07/14/2022 1613   CREATININE 1.47 (H) 07/14/2022 1613   CALCIUM 9.3 07/14/2022 1613   PROT 5.3 (L) 02/28/2022 0004   ALBUMIN 2.6 (L) 02/28/2022 0004   AST 29 02/28/2022 0004   ALT 20 02/28/2022 0004   ALKPHOS 84 02/28/2022 0004   BILITOT 0.6 02/28/2022 0004   GFRNONAA 38 (L) 07/14/2022 1613   GFRAA 38 (L) 06/02/2016 2343   Lab Results  Component Value  Date   HGBA1C 7.5 (H) 02/18/2022   Lab Results  Component Value Date   VITAMINB12 1,901 (H) 02/28/2022   Lab Results  Component Value Date   TSH 0.216 (L) 02/28/2022      ASSESSMENT AND PLAN 72 y.o. year old female  has a past medical history of Anxiety, Arthritis, Bipolar disorder (HCC), COPD (chronic obstructive pulmonary disease) (HCC), Diabetes mellitus type II, Esophageal stricture, GERD (gastroesophageal reflux disease), Heart murmur, Hyperlipemia, Hypertension, Hypothyroidism, Mental disorder, Obesity, Oxygen desaturation during sleep, Schizophrenia (HCC), Shortness of breath, and Sleep apnea. here with:  1.  Restless leg syndrome  Continue Requip 0.75 mg at bedtime Advised if symptoms worsen or she develops new symptoms she should let us know  2.  Seizure  like episode  No additional events continue to monitor  3.  Nocturnal headaches  Possibly due to obstructive sleep apnea?  In the chart there are some documentation of nocturnal hypoxemia and being on supplemental O2-per patient she is no longer on oxygen.  Advised that she can discuss with her pulmonologist.  Did advise that poor compliance with BiPAP therapy/ hypoxemia could be contributing to her nighttime headaches.  Patient voiced understanding.  Follow-up in 1 year or sooner if needed    Butch Penny, MSN, NP-C 10/14/2022, 10:36 AM [facility] [address], [address] [place], [place] [address] [phone]

## 2022-10-29 ENCOUNTER — Inpatient Hospital Stay: Payer: 59 | Admitting: Oncology

## 2022-10-29 ENCOUNTER — Inpatient Hospital Stay: Payer: 59 | Attending: Nurse Practitioner

## 2022-10-29 ENCOUNTER — Encounter: Payer: Self-pay | Admitting: Oncology

## 2022-10-29 VITALS — BP 136/72 | HR 72 | Temp 98.1°F | Resp 18 | Ht 62.0 in | Wt 192.0 lb

## 2022-10-29 DIAGNOSIS — N189 Chronic kidney disease, unspecified: Secondary | ICD-10-CM | POA: Insufficient documentation

## 2022-10-29 DIAGNOSIS — E7800 Pure hypercholesterolemia, unspecified: Secondary | ICD-10-CM | POA: Insufficient documentation

## 2022-10-29 DIAGNOSIS — D509 Iron deficiency anemia, unspecified: Secondary | ICD-10-CM

## 2022-10-29 DIAGNOSIS — J449 Chronic obstructive pulmonary disease, unspecified: Secondary | ICD-10-CM | POA: Insufficient documentation

## 2022-10-29 DIAGNOSIS — G4730 Sleep apnea, unspecified: Secondary | ICD-10-CM | POA: Insufficient documentation

## 2022-10-29 DIAGNOSIS — E1122 Type 2 diabetes mellitus with diabetic chronic kidney disease: Secondary | ICD-10-CM | POA: Diagnosis not present

## 2022-10-29 DIAGNOSIS — E039 Hypothyroidism, unspecified: Secondary | ICD-10-CM | POA: Insufficient documentation

## 2022-10-29 DIAGNOSIS — F209 Schizophrenia, unspecified: Secondary | ICD-10-CM | POA: Diagnosis not present

## 2022-10-29 DIAGNOSIS — F32A Depression, unspecified: Secondary | ICD-10-CM | POA: Diagnosis not present

## 2022-10-29 DIAGNOSIS — I129 Hypertensive chronic kidney disease with stage 1 through stage 4 chronic kidney disease, or unspecified chronic kidney disease: Secondary | ICD-10-CM | POA: Insufficient documentation

## 2022-10-29 DIAGNOSIS — M1990 Unspecified osteoarthritis, unspecified site: Secondary | ICD-10-CM | POA: Insufficient documentation

## 2022-10-29 LAB — CBC WITH DIFFERENTIAL (CANCER CENTER ONLY)
Abs Immature Granulocytes: 0.01 10*3/uL (ref 0.00–0.07)
Basophils Absolute: 0 10*3/uL (ref 0.0–0.1)
Basophils Relative: 0 %
Eosinophils Absolute: 0.2 10*3/uL (ref 0.0–0.5)
Eosinophils Relative: 4 %
HCT: 32.3 % — ABNORMAL LOW (ref 36.0–46.0)
Hemoglobin: 10.4 g/dL — ABNORMAL LOW (ref 12.0–15.0)
Immature Granulocytes: 0 %
Lymphocytes Relative: 31 %
Lymphs Abs: 1.3 10*3/uL (ref 0.7–4.0)
MCH: 27.9 pg (ref 26.0–34.0)
MCHC: 32.2 g/dL (ref 30.0–36.0)
MCV: 86.6 fL (ref 80.0–100.0)
Monocytes Absolute: 0.5 10*3/uL (ref 0.1–1.0)
Monocytes Relative: 12 %
Neutro Abs: 2.2 10*3/uL (ref 1.7–7.7)
Neutrophils Relative %: 53 %
Platelet Count: 194 10*3/uL (ref 150–400)
RBC: 3.73 MIL/uL — ABNORMAL LOW (ref 3.87–5.11)
RDW: 13.8 % (ref 11.5–15.5)
WBC Count: 4.3 10*3/uL (ref 4.0–10.5)
nRBC: 0 % (ref 0.0–0.2)

## 2022-10-29 LAB — FERRITIN: Ferritin: 16 ng/mL (ref 11–307)

## 2022-10-29 NOTE — Progress Notes (Signed)
  [facility] OFFICE PROGRESS NOTE   Diagnosis: Iron deficiency anemia  INTERVAL HISTORY:   Chelsea [name] returns as scheduled.  She is taking iron.  No bleeding.  Good appetite.  She reports malaise.  No dyspnea.  No other complaint.  Objective:  Vital signs in last 24 hours:  Blood pressure 136/72, pulse 72, temperature 98.1 F (36.7 C), temperature source Oral, resp. rate 18, height 5\' 2"  (1.575 m), weight 192 lb (87.1 kg), SpO2 98%.    Lymphatics: No cervical, supraclavicular, axillary, or inguinal nodes Resp: Lungs clear bilaterally, distant breath sounds, no respiratory distress Cardio: Regular rate and rhythm GI: No hepatosplenomegaly, no mass, nontender Vascular: No leg edema   Lab Results:  Lab Results  Component Value Date   WBC 4.3 10/29/2022   HGB 10.4 (L) 10/29/2022   HCT 32.3 (L) 10/29/2022   MCV 86.6 10/29/2022   PLT 194 10/29/2022   NEUTROABS 2.2 10/29/2022    CMP  Lab Results  Component Value Date   NA 135 07/14/2022   K 4.4 07/14/2022   CL 100 07/14/2022   CO2 26 07/14/2022   GLUCOSE 384 (H) 07/14/2022   BUN 42 (H) 07/14/2022   CREATININE 1.47 (H) 07/14/2022   CALCIUM 9.3 07/14/2022   PROT 5.3 (L) 02/28/2022   ALBUMIN 2.6 (L) 02/28/2022   AST 29 02/28/2022   ALT 20 02/28/2022   ALKPHOS 84 02/28/2022   BILITOT 0.6 02/28/2022   GFRNONAA 38 (L) 07/14/2022   GFRAA 38 (L) 06/02/2016     Medications: I have reviewed the patient's current medications.   Assessment/Plan: Iron deficiency anemia Admission with GI bleed July 2022, December 2022 09/28/2020 EGD-esophageal plaques consistent with candidiasis, gastritis 09/29/2020 colonoscopy-internal hemorrhoids, 2 small polyps in the mid transverse colon and distal ascending colon 06/05/2022 capsule endoscopy-normal Chronic kidney disease COPD Sleep apnea Depression Schizophrenia Diabetes Hypertension Hypothyroid Elevated cholesterol Osteoarthritis   Disposition: Chelsea [name]  appears stable.  The hemoglobin is slightly lower today.  Will follow-up on the ferritin level from today.  The anemia may be in part related to chronic disease and chronic renal insufficiency.  She will return for an office and lab visit in 2 months.  Thornton Papas, MD  10/29/2022  8:33 AM

## 2022-10-30 ENCOUNTER — Telehealth: Payer: Self-pay | Admitting: *Deleted

## 2022-10-30 NOTE — Telephone Encounter (Signed)
Notified her sister of CBC/ferritin results and to continue oral iron bid. F/U as scheduled.

## 2022-10-30 NOTE — Telephone Encounter (Signed)
-----   Message from Thornton Papas sent at 10/30/2022  2:13 PM EDT ----- Please call patient, the iron level is in the low normal range, continue ferrous sulfate, follow-up as scheduled, check ferritin level next visit

## 2022-11-20 ENCOUNTER — Ambulatory Visit (INDEPENDENT_AMBULATORY_CARE_PROVIDER_SITE_OTHER): Payer: 59 | Admitting: Podiatry

## 2022-11-20 ENCOUNTER — Encounter: Payer: Self-pay | Admitting: Podiatry

## 2022-11-20 DIAGNOSIS — M79675 Pain in left toe(s): Secondary | ICD-10-CM

## 2022-11-20 DIAGNOSIS — M79674 Pain in right toe(s): Secondary | ICD-10-CM

## 2022-11-20 DIAGNOSIS — E119 Type 2 diabetes mellitus without complications: Secondary | ICD-10-CM | POA: Diagnosis not present

## 2022-11-20 DIAGNOSIS — B351 Tinea unguium: Secondary | ICD-10-CM

## 2022-11-20 NOTE — Progress Notes (Signed)
This patient returns to my office for at risk foot care.  This patient requires this care by a professional since this patient will be at risk due to having diabetes.  This patient is unable to cut nails herself since the patient cannot reach her nails.These nails are painful walking and wearing shoes.  This patient presents for at risk foot care today.  General Appearance  Alert, conversant and in no acute stress.  Vascular  Dorsalis pedis  are palpable  Bilaterally. Posterior tibial pulses are not palpable  B/L.  Capillary return is within normal limits  Bilaterally.  Cold feet.  Bilaterally. Absent hair.  Neurologic  Senn-Weinstein monofilament wire test within normal limits  bilaterally. Muscle power within normal limits bilaterally.  Nails Thick disfigured discolored nails with subungual debris  Hallux nails bilaterally. No evidence of bacterial infection or drainage bilaterally.  Orthopedic  No limitations of motion  feet .  No crepitus or effusions noted.  No bony pathology or digital deformities noted.  Skin  normotropic skin with no porokeratosis noted bilaterally.  No signs of infections or ulcers noted.     Onychomycosis  Pain in right toe  Pain in left toe.  Consent was obtained for treatment procedures.  Debridement and grinding of long thick nails with clearing of subungual debris.  No infection or ulcer.     Return office visit   4 months.       Told patient to return for periodic foot care and evaluation due to potential at risk complications.   Helane Gunther DPM

## 2022-11-23 ENCOUNTER — Other Ambulatory Visit: Payer: Self-pay | Admitting: Neurology

## 2022-11-23 ENCOUNTER — Other Ambulatory Visit: Payer: Self-pay | Admitting: Nurse Practitioner

## 2022-11-23 DIAGNOSIS — D509 Iron deficiency anemia, unspecified: Secondary | ICD-10-CM

## 2022-11-27 ENCOUNTER — Other Ambulatory Visit: Payer: Self-pay | Admitting: Neurology

## 2022-12-03 ENCOUNTER — Other Ambulatory Visit (HOSPITAL_COMMUNITY): Payer: Self-pay | Admitting: Psychiatry

## 2022-12-03 DIAGNOSIS — F200 Paranoid schizophrenia: Secondary | ICD-10-CM

## 2022-12-11 ENCOUNTER — Other Ambulatory Visit: Payer: Self-pay

## 2022-12-11 ENCOUNTER — Ambulatory Visit (HOSPITAL_BASED_OUTPATIENT_CLINIC_OR_DEPARTMENT_OTHER): Payer: 59 | Admitting: Psychiatry

## 2022-12-11 ENCOUNTER — Encounter (HOSPITAL_COMMUNITY): Payer: Self-pay | Admitting: Psychiatry

## 2022-12-11 VITALS — BP 137/81 | HR 72 | Ht 62.0 in | Wt 192.0 lb

## 2022-12-11 DIAGNOSIS — R413 Other amnesia: Secondary | ICD-10-CM | POA: Diagnosis not present

## 2022-12-11 DIAGNOSIS — F411 Generalized anxiety disorder: Secondary | ICD-10-CM | POA: Diagnosis not present

## 2022-12-11 DIAGNOSIS — F200 Paranoid schizophrenia: Secondary | ICD-10-CM

## 2022-12-11 MED ORDER — CLONAZEPAM 1 MG PO TABS
1.0000 mg | ORAL_TABLET | Freq: Every day | ORAL | 2 refills | Status: DC
Start: 2022-12-11 — End: 2023-03-11

## 2022-12-11 MED ORDER — PAROXETINE HCL 40 MG PO TABS
40.0000 mg | ORAL_TABLET | Freq: Every morning | ORAL | 0 refills | Status: DC
Start: 2022-12-11 — End: 2023-03-11

## 2022-12-11 MED ORDER — TRAZODONE HCL 100 MG PO TABS
100.0000 mg | ORAL_TABLET | Freq: Every day | ORAL | 0 refills | Status: DC
Start: 2022-12-11 — End: 2023-03-11

## 2022-12-11 MED ORDER — ZIPRASIDONE HCL 80 MG PO CAPS
80.0000 mg | ORAL_CAPSULE | Freq: Every day | ORAL | 0 refills | Status: DC
Start: 2022-12-11 — End: 2023-03-11

## 2022-12-11 NOTE — Progress Notes (Signed)
BH MD/PA/NP OP Progress Note  12/11/2022 2:13 PM Chelsea [name]  MRN:  [id number]  Chief Complaint:  Chief Complaint  Patient presents with   Follow-up   HPI: Patient came in today for her follow-up appointment.  She came with her sister to the office.  Sister reported things are changed but living situation may change in the future.  Currently patient is living with her sister and brother-in-law but recently brother-in-law diagnosed with Alzheimer.  Patient sister mention this will change the living situation because they may downsize and move to a different place.  Sister does not want to leave the patient alone and actively looking for places where patient can moved in.  They have looking into assisted living facility.  Patient also went to [facility] senior citizen activity center at [facility] which is open from 830 to 5 PM and so far patient liked the activities.  Patient herself is noticed deteriorating in her memory issues.  Patient denies any hallucination, paranoia, suicidal thoughts.  She is dependent on her sister as she does not drive.  She does not know how to use technology.  Her attention concentration is fair and she tends to forgetful things.  She is using BiPAP that is helping her sleep.  She denies any crying spells or any feeling of hopelessness or worthlessness.  She is taking Ozempic which is keeping her weight stable.  She is seeing nephrologist and recently had blood work.  Her blood work shows anemia.  Her primary care is [facility] who usually comes at home.  Patient's sister did talk to primary care if patient moved to other places then they will continue to keep her patient.  Patient has mild tremors but denies any recent fall, dizziness.  Patient does not want to change the medication since it is working well.  Visit Diagnosis:    ICD-10-CM   1. Paranoid schizophrenia (HCC)  F20.0 traZODone (DESYREL) 100 MG tablet    ziprasidone (GEODON) 80 MG capsule     PARoxetine (PAXIL) 40 MG tablet    clonazePAM (KLONOPIN) 1 MG tablet    2. Generalized anxiety disorder  F41.1 clonazePAM (KLONOPIN) 1 MG tablet    3. Memory deficits  R41.3 ziprasidone (GEODON) 80 MG capsule      Past Psychiatric History: Reviewed H/O inpatient at [facility] for mania and psychosis.  No h/o suicidal attempt.    Past Medical History:  Past Medical History:  Diagnosis Date   Anxiety    Arthritis    Bipolar disorder (HCC)    COPD (chronic obstructive pulmonary disease) (HCC)    Diabetes mellitus type II    Esophageal stricture    moderate distal   GERD (gastroesophageal reflux disease)    Heart murmur    "HAD AN ECHO YEARS AGO- NOTHING TO BE CONCERNED ABOUT"   Hyperlipemia    Hypertension    Hypothyroidism    Mental disorder    Bipolar   Obesity    Oxygen desaturation during sleep    wears 2 liters at bedtime    Schizophrenia (HCC)    patient reports that she is unaware of this   Shortness of breath    with a little activy   Sleep apnea    wears 2 liters of oxygen at bedtime instead of CPAP    Past Surgical History:  Procedure Laterality Date   BALLOON DILATION N/A 06/10/2016   Procedure: BALLOON DILATION;  Surgeon: Charlott Rakes, MD;  Location: [facility] ENDOSCOPY;  Service: Endoscopy;  Laterality: N/A;   BREAST EXCISIONAL BIOPSY Left 05/10/2012   benign   CHOLECYSTECTOMY     COLONOSCOPY WITH PROPOFOL N/A 01/30/2017   Procedure: COLONOSCOPY WITH PROPOFOL;  Surgeon: Charlott Rakes, MD;  Location: [facility] ENDOSCOPY;  Service: Endoscopy;  Laterality: N/A;   COLONOSCOPY WITH PROPOFOL N/A 09/29/2020   Procedure: COLONOSCOPY WITH PROPOFOL;  Surgeon: Vida Rigger, MD;  Location: [facility] ENDOSCOPY;  Service: Endoscopy;  Laterality: N/A;   ESOPHAGEAL MANOMETRY N/A 12/19/2020   Procedure: ESOPHAGEAL MANOMETRY (EM);  Surgeon: Charlott Rakes, MD;  Location: [facility] ENDOSCOPY;  Service: Endoscopy;  Laterality: N/A;   ESOPHAGOGASTRODUODENOSCOPY N/A 06/03/2016    Procedure: ESOPHAGOGASTRODUODENOSCOPY (EGD);  Surgeon: Charlott Rakes, MD;  Location: [facility] ENDOSCOPY;  Service: Endoscopy;  Laterality: N/A;   ESOPHAGOGASTRODUODENOSCOPY N/A 06/10/2016   Procedure: ESOPHAGOGASTRODUODENOSCOPY (EGD);  Surgeon: Charlott Rakes, MD;  Location: [facility] ENDOSCOPY;  Service: Endoscopy;  Laterality: N/A;   ESOPHAGOGASTRODUODENOSCOPY (EGD) WITH PROPOFOL N/A 09/28/2020   Procedure: ESOPHAGOGASTRODUODENOSCOPY (EGD) WITH PROPOFOL;  Surgeon: Willis Modena, MD;  Location: [facility] ENDOSCOPY;  Service: Endoscopy;  Laterality: N/A;   EYE SURGERY     PARTIAL MASTECTOMY WITH NEEDLE LOCALIZATION Left 05/10/2012   Procedure: PARTIAL MASTECTOMY WITH NEEDLE LOCALIZATION;  Surgeon: Ernestene Mention, MD;  Location: [facility] OR;  Service: General;  Laterality: Left;   POLYPECTOMY  09/29/2020   Procedure: POLYPECTOMY;  Surgeon: Vida Rigger, MD;  Location: [facility] ENDOSCOPY;  Service: Endoscopy;;   TONSILLECTOMY      Family Psychiatric History: Reviewed  Family History:  Family History  Problem Relation Age of Onset   Alzheimer's disease Mother    Alcohol abuse Father    Stroke Father    Sleep apnea Sister    Restless legs syndrome Neg Hx    Seizures Neg Hx     Social History:  Social History   Socioeconomic History   Marital status: Divorced    Spouse name: Not on file   Number of children: 0   Years of education: HS   Highest education level: Not on file  Occupational History   Occupation: Retired   Tobacco Use   Smoking status: Former    Current packs/day: 0.00    Average packs/day: 3.0 packs/day for 29.0 years (87.0 ttl pk-yrs)    Types: Cigarettes    Start date: 05/03/1964    Quit date: 05/03/1993    Years since quitting: 29.6   Smokeless tobacco: Never  Vaping Use   Vaping status: Never Used  Substance and Sexual Activity   Alcohol use: No    Alcohol/week: 0.0 standard drinks of alcohol   Drug use: No   Sexual activity: Not Currently  Other Topics Concern   Not on file   Social History Narrative   Caffeine: rare    Social Determinants of Health   Financial Resource Strain: Not on file  Food Insecurity: Not on file  Transportation Needs: Not on file  Physical Activity: Not on file  Stress: Not on file  Social Connections: Not on file    Allergies:  Allergies  Allergen Reactions   Darvocet [Propoxyphene N-Acetaminophen] Anaphylaxis    Can take plain tylenol   Fluoxetine Other (See Comments)    Hallucinations    Penicillins Rash   Promethazine Hives    Metabolic Disorder Labs: Lab Results  Component Value Date   HGBA1C 7.5 (H) 02/18/2022   MPG 168.55 02/18/2022   MPG 139.85 03/20/2021   No results found for: "PROLACTIN" Lab Results  Component Value  Date   CHOL 109 03/20/2021   TRIG 61 03/20/2021   HDL 32 (L) 03/20/2021   CHOLHDL 3.4 03/20/2021   VLDL 12 03/20/2021   LDLCALC 65 03/20/2021   Lab Results  Component Value Date   TSH 0.216 (L) 02/28/2022   TSH 1.509 12/03/2012    Therapeutic Level Labs: No results found for: "LITHIUM" No results found for: "VALPROATE" No results found for: "CBMZ"  Current Medications: Current Outpatient Medications  Medication Sig Dispense Refill   acetaminophen (TYLENOL) 325 MG tablet Take 650 mg by mouth every 6 (six) hours as needed for moderate pain.     albuterol (PROVENTIL) (2.5 MG/3ML) 0.083% nebulizer solution Take 3 mLs (2.5 mg total) by nebulization every 6 (six) hours as needed for wheezing or shortness of breath. 75 mL 12   amLODipine (NORVASC) 10 MG tablet Take 10 mg by mouth daily.     Cholecalciferol (VITAMIN D3) 5000 units TABS Take 5,000 Units by mouth daily.     clonazePAM (KLONOPIN) 1 MG tablet Take 1 tablet (1 mg total) by mouth at bedtime. 30 tablet 2   diclofenac sodium (VOLTAREN) 1 % GEL Apply 2 g topically 4 (four) times daily as needed (pain).     Ensure Max Protein (ENSURE MAX PROTEIN) LIQD Take 330 mLs (11 oz total) by mouth 2 (two) times daily. 3300 mL 0   ferrous  gluconate (FERGON) 324 MG tablet TAKE 1 TABLET BY MOUTH 2 TIMES DAILY WITH A MEAL 60 tablet 3   fluticasone (FLONASE) 50 MCG/ACT nasal spray Place 2 sprays into both nostrils daily. As needed     fluticasone furoate-vilanterol (BREO ELLIPTA) 200-25 MCG/INH AEPB Inhale 1 puff into the lungs every morning.     insulin aspart (NOVOLOG FLEXPEN) 100 UNIT/ML FlexPen Inject 2-12 Units into the skin 3 (three) times daily with meals. Sliding scale     LEVEMIR 100 UNIT/ML injection Inject 30 Units into the skin at bedtime.     levothyroxine (SYNTHROID) 100 MCG tablet Take 1 tablet (100 mcg total) by mouth daily before breakfast. 30 tablet 3   metFORMIN (GLUCOPHAGE) 500 MG tablet Take 500 mg by mouth in the morning and at bedtime.     metoprolol (TOPROL-XL) 200 MG 24 hr tablet Take 200 mg by mouth daily.     montelukast (SINGULAIR) 10 MG tablet Take 10 mg by mouth daily.     Multiple Vitamin (MULTIVITAMIN WITH MINERALS) TABS tablet Take 1 tablet by mouth daily. 30 tablet 0   nystatin ointment (MYCOSTATIN) Apply 1 application  topically 2 (two) times daily as needed (for rashes).     omeprazole (PRILOSEC) 40 MG capsule Take 40 mg by mouth daily before breakfast.     ondansetron (ZOFRAN) 4 MG tablet Take 1 tablet (4 mg total) by mouth every 6 (six) hours as needed for nausea. 20 tablet 0   OZEMPIC, 2 MG/DOSE, 8 MG/3ML SOPN Inject 2 mg into the skin every Friday.     PARoxetine (PAXIL) 40 MG tablet Take 1 tablet (40 mg total) by mouth every morning. 90 tablet 0   polyethylene glycol (MIRALAX / GLYCOLAX) 17 g packet Take 17 g by mouth daily as needed for mild constipation. 14 each 0   rOPINIRole (REQUIP) 0.25 MG tablet TAKE 3 TABLETS BY MOUTH AT BEDTIME 90 tablet 11   rosuvastatin (CRESTOR) 10 MG tablet Take 10 mg by mouth at bedtime.     traZODone (DESYREL) 100 MG tablet Take 1 tablet (100 mg total) by mouth  at bedtime. 90 tablet 0   TRUEPLUS INSULIN SYRINGE 29G X 1/2" 1 ML MISC 1 each by Other route daily.      VENTOLIN HFA 108 (90 Base) MCG/ACT inhaler Inhale 1-2 puffs into the lungs every 4 (four) hours as needed for wheezing or shortness of breath.     ziprasidone (GEODON) 80 MG capsule Take 1 capsule (80 mg total) by mouth at bedtime. 90 capsule 0   bumetanide (BUMEX) 1 MG tablet Take 1 tablet (1 mg total) by mouth daily. (Patient not taking: Reported on 12/11/2022) 30 tablet 0   No current facility-administered medications for this visit.     Musculoskeletal: Strength & Muscle Tone: within normal limits Gait & Station: normal Patient leans: N/A  Psychiatric Specialty Exam: Review of Systems  Blood pressure 137/81, pulse 72, height 5\' 2"  (1.575 m), weight 192 lb (87.1 kg).Body mass index is 35.12 kg/m.  General Appearance: Fairly Groomed  Eye Contact:  Fair  Speech:  Slow  Volume:  Decreased  Mood:  Euthymic  Affect:  Restricted  Thought Process:  Descriptions of Associations: Intact  Orientation:  Full (Time, Place, and Person)  Thought Content: Rumination   Suicidal Thoughts:  No  Homicidal Thoughts:  No  Memory:  Immediate;   Fair Recent;   Fair Remote;   Fair  Judgement:  Fair  Insight:  Present  Psychomotor Activity:  Decreased  Concentration:  Concentration: Fair and Attention Span: Fair  Recall:  [employer] of Knowledge: Fair  Language: Fair  Akathisia:  No  Handed:  Right  AIMS (if indicated): not done  Assets:  Communication Skills Desire for Improvement Housing Social Support  ADL's:  Impaired  Cognition: Impaired,  Mild  Sleep:   ok, using BIPAP   Screenings: Flowsheet Row ED to Hosp-Admission (Discharged) from 02/27/2022 in [facility] ED to Hosp-Admission (Discharged) from 02/18/2022 in [facility] ED to Hosp-Admission (Discharged) from 03/19/2021 in [facility] [place] [facility]  C-SSRS RISK CATEGORY No Risk No Risk No Risk        Assessment and Plan: I review psychosocial issues.  Patient sister is  concerned about her living as currently she is living with sister but they may have to move after her sisters has been diagnosed with Alzheimer.  Patient sister looking for a place where she can live comfortably.  Her plan is to visit her every day if needed.  Patient denies any dizziness or any fall.  She has memory issues and lately she feels that she needs someone all the time to help her.  She does not drive.  She did go to senior citizen group and she enjoyed.  We talk about cutting down the medication but patient very nervous and anxious about cutting down the medication.  We did talk about polypharmacy and benzodiazepine dependency.  Patient is not taking this medicine for a while and she feel it is keeping her stable.  Her paranoia is under control.  We will keep the Paxil 40 mg daily, Geodon 80 mg at bedtime, Klonopin 1 mg at bedtime and Paxil 40 mg daily.  She has mild tremors but it is not causing worsening in her daily activities.  I encouraged to contact her primary care if they can help them to find a social worker to find more resources.  We will follow-up in 3 months in person.  Collaboration of Care: Collaboration of Care: Other provider involved in patient's care  AEB notes are available in epic to review  Patient/Guardian was advised Release of Information must be obtained prior to any record release in order to collaborate their care with an outside provider. Patient/Guardian was advised if they have not already done so to contact the registration department to sign all necessary forms in order for [place] to release information regarding their care.   Consent: Patient/Guardian gives verbal consent for treatment and assignment of benefits for services provided during this visit. Patient/Guardian expressed understanding and agreed to proceed.    Cleotis Nipper, MD 12/11/2022, 2:13 PM

## 2022-12-15 ENCOUNTER — Other Ambulatory Visit: Payer: Self-pay | Admitting: Nurse Practitioner

## 2022-12-15 DIAGNOSIS — Z1231 Encounter for screening mammogram for malignant neoplasm of breast: Secondary | ICD-10-CM

## 2022-12-22 ENCOUNTER — Other Ambulatory Visit (HOSPITAL_COMMUNITY): Payer: Self-pay | Admitting: Psychiatry

## 2022-12-22 DIAGNOSIS — R413 Other amnesia: Secondary | ICD-10-CM

## 2022-12-22 DIAGNOSIS — F200 Paranoid schizophrenia: Secondary | ICD-10-CM

## 2022-12-22 DIAGNOSIS — F411 Generalized anxiety disorder: Secondary | ICD-10-CM

## 2022-12-29 ENCOUNTER — Inpatient Hospital Stay (HOSPITAL_BASED_OUTPATIENT_CLINIC_OR_DEPARTMENT_OTHER): Payer: 59 | Admitting: Oncology

## 2022-12-29 ENCOUNTER — Inpatient Hospital Stay: Payer: 59 | Attending: Nurse Practitioner

## 2022-12-29 VITALS — BP 120/63 | HR 66 | Temp 98.1°F | Resp 18 | Ht 62.0 in | Wt 191.3 lb

## 2022-12-29 DIAGNOSIS — D509 Iron deficiency anemia, unspecified: Secondary | ICD-10-CM

## 2022-12-29 DIAGNOSIS — E039 Hypothyroidism, unspecified: Secondary | ICD-10-CM | POA: Insufficient documentation

## 2022-12-29 DIAGNOSIS — M1990 Unspecified osteoarthritis, unspecified site: Secondary | ICD-10-CM | POA: Insufficient documentation

## 2022-12-29 DIAGNOSIS — E7800 Pure hypercholesterolemia, unspecified: Secondary | ICD-10-CM | POA: Diagnosis not present

## 2022-12-29 DIAGNOSIS — I129 Hypertensive chronic kidney disease with stage 1 through stage 4 chronic kidney disease, or unspecified chronic kidney disease: Secondary | ICD-10-CM | POA: Insufficient documentation

## 2022-12-29 DIAGNOSIS — E1122 Type 2 diabetes mellitus with diabetic chronic kidney disease: Secondary | ICD-10-CM | POA: Insufficient documentation

## 2022-12-29 DIAGNOSIS — G4730 Sleep apnea, unspecified: Secondary | ICD-10-CM | POA: Insufficient documentation

## 2022-12-29 DIAGNOSIS — N189 Chronic kidney disease, unspecified: Secondary | ICD-10-CM | POA: Insufficient documentation

## 2022-12-29 DIAGNOSIS — J449 Chronic obstructive pulmonary disease, unspecified: Secondary | ICD-10-CM | POA: Insufficient documentation

## 2022-12-29 LAB — BASIC METABOLIC PANEL - CANCER CENTER ONLY
Anion gap: 7 (ref 5–15)
BUN: 34 mg/dL — ABNORMAL HIGH (ref 8–23)
CO2: 26 mmol/L (ref 22–32)
Calcium: 9.2 mg/dL (ref 8.9–10.3)
Chloride: 106 mmol/L (ref 98–111)
Creatinine: 1.31 mg/dL — ABNORMAL HIGH (ref 0.44–1.00)
GFR, Estimated: 44 mL/min — ABNORMAL LOW (ref >60)
Glucose, Bld: 153 mg/dL — ABNORMAL HIGH (ref 70–99)
Potassium: 4.7 mmol/L (ref 3.5–5.1)
Sodium: 139 mmol/L (ref 135–145)

## 2022-12-29 LAB — CBC WITH DIFFERENTIAL (CANCER CENTER ONLY)
Abs Immature Granulocytes: 0.02 10*3/uL (ref 0.00–0.07)
Basophils Absolute: 0 10*3/uL (ref 0.0–0.1)
Basophils Relative: 0 %
Eosinophils Absolute: 0.5 10*3/uL (ref 0.0–0.5)
Eosinophils Relative: 6 %
HCT: 36.1 % (ref 36.0–46.0)
Hemoglobin: 11.8 g/dL — ABNORMAL LOW (ref 12.0–15.0)
Immature Granulocytes: 0 %
Lymphocytes Relative: 17 %
Lymphs Abs: 1.4 10*3/uL (ref 0.7–4.0)
MCH: 28.8 pg (ref 26.0–34.0)
MCHC: 32.7 g/dL (ref 30.0–36.0)
MCV: 88 fL (ref 80.0–100.0)
Monocytes Absolute: 0.6 10*3/uL (ref 0.1–1.0)
Monocytes Relative: 8 %
Neutro Abs: 5.3 10*3/uL (ref 1.7–7.7)
Neutrophils Relative %: 69 %
Platelet Count: 220 10*3/uL (ref 150–400)
RBC: 4.1 MIL/uL (ref 3.87–5.11)
RDW: 13 % (ref 11.5–15.5)
WBC Count: 7.8 10*3/uL (ref 4.0–10.5)
nRBC: 0 % (ref 0.0–0.2)

## 2022-12-29 LAB — FERRITIN: Ferritin: 23 ng/mL (ref 11–307)

## 2022-12-29 NOTE — Progress Notes (Signed)
  [facility] OFFICE PROGRESS NOTE   Diagnosis: Iron deficiency anemia  INTERVAL HISTORY:   Chelsea [name] returns as scheduled.  She is taking iron twice daily.  No difficulty with bowel function.  No nausea.  No bleeding.  She reports craving ice.  Objective:  Vital signs in last 24 hours:  Blood pressure 120/63, pulse 66, temperature 98.1 F (36.7 C), temperature source Temporal, resp. rate 18, height 5\' 2"  (1.575 m), weight 191 lb 4.8 oz (86.8 kg), SpO2 99%.    HEENT: No thrush or ulcers Resp: Distant breath sounds, no respiratory distress Cardio: Regular rate and rhythm GI: No hepatosplenomegaly, nontender Vascular: No leg edema, bilateral lower leg varicosities   Lab Results:  Lab Results  Component Value Date   WBC 7.8 12/29/2022   HGB 11.8 (L) 12/29/2022   HCT 36.1 12/29/2022   MCV 88.0 12/29/2022   PLT 220 12/29/2022   NEUTROABS 5.3 12/29/2022    CMP  Lab Results  Component Value Date   NA 139 12/29/2022   K 4.7 12/29/2022   CL 106 12/29/2022   CO2 26 12/29/2022   GLUCOSE 153 (H) 12/29/2022   BUN 34 (H) 12/29/2022   CREATININE 1.31 (H) 12/29/2022   CALCIUM 9.2 12/29/2022   PROT 5.3 (L) 02/28/2022   ALBUMIN 2.6 (L) 02/28/2022   AST 29 02/28/2022   ALT 20 02/28/2022   ALKPHOS 84 02/28/2022   BILITOT 0.6 02/28/2022   GFRNONAA 44 (L) 12/29/2022   GFRAA 38 (L) 06/02/2016    Medications: I have reviewed the patient's current medications.   Assessment/Plan: Iron deficiency anemia Admission with GI bleed July 2022, December 2022 09/28/2020 EGD-esophageal plaques consistent with candidiasis, gastritis 09/29/2020 colonoscopy-internal hemorrhoids, 2 small polyps in the mid transverse colon and distal ascending colon 06/05/2022 capsule endoscopy-normal Chronic kidney disease COPD Sleep apnea Depression Schizophrenia Diabetes Hypertension Hypothyroid Elevated cholesterol Osteoarthritis     Disposition: Chelsea [name] has a history of iron  deficiency anemia.  The hemoglobin is higher compared to when she was here in July.  The ferritin has been in the low normal range.  We will follow-up on the ferritin level from today.  She will continue iron.  Chelsea [name] will return for an office and lab visit in 3 months.  The anemia may also be in part related to chronic renal failure.  Chelsea [name] has received influenza and COVID-19 vaccines.  She will discuss pneumococcal vaccination with her primary provider.  Thornton Papas, MD  12/29/2022  10:25 AM

## 2022-12-30 ENCOUNTER — Telehealth: Payer: Self-pay

## 2022-12-30 NOTE — Telephone Encounter (Signed)
-----   Message from Thornton Papas sent at 12/29/2022  1:00 PM EDT ----- Please call patient, hemoglobin is higher and ferritin remains in the low normal range, continue iron, follow-up as scheduled

## 2022-12-30 NOTE — Telephone Encounter (Signed)
Patient gave verbal understanding had no further questions or concerns. 

## 2023-01-15 ENCOUNTER — Ambulatory Visit
Admission: RE | Admit: 2023-01-15 | Discharge: 2023-01-15 | Disposition: A | Discharge status: Home / Self Care | Payer: 59 | Source: Ambulatory Visit | Attending: Nurse Practitioner | Admitting: Nurse Practitioner

## 2023-01-15 DIAGNOSIS — Z1231 Encounter for screening mammogram for malignant neoplasm of breast: Secondary | ICD-10-CM

## 2023-01-19 ENCOUNTER — Other Ambulatory Visit: Payer: Self-pay | Admitting: Nurse Practitioner

## 2023-01-19 DIAGNOSIS — R928 Other abnormal and inconclusive findings on diagnostic imaging of breast: Secondary | ICD-10-CM

## 2023-02-03 ENCOUNTER — Ambulatory Visit
Admission: RE | Admit: 2023-02-03 | Discharge: 2023-02-03 | Disposition: A | Discharge status: Home / Self Care | Payer: 59 | Source: Ambulatory Visit | Attending: Nurse Practitioner | Admitting: Nurse Practitioner

## 2023-02-03 DIAGNOSIS — R928 Other abnormal and inconclusive findings on diagnostic imaging of breast: Secondary | ICD-10-CM

## 2023-02-24 ENCOUNTER — Other Ambulatory Visit: Payer: Self-pay | Admitting: Oncology

## 2023-02-24 ENCOUNTER — Other Ambulatory Visit (HOSPITAL_COMMUNITY): Payer: Self-pay | Admitting: Psychiatry

## 2023-02-24 DIAGNOSIS — F200 Paranoid schizophrenia: Secondary | ICD-10-CM

## 2023-02-24 DIAGNOSIS — R413 Other amnesia: Secondary | ICD-10-CM

## 2023-02-24 DIAGNOSIS — F411 Generalized anxiety disorder: Secondary | ICD-10-CM

## 2023-02-24 DIAGNOSIS — D509 Iron deficiency anemia, unspecified: Secondary | ICD-10-CM

## 2023-03-11 ENCOUNTER — Encounter (HOSPITAL_COMMUNITY): Payer: Self-pay | Admitting: Psychiatry

## 2023-03-11 ENCOUNTER — Telehealth (HOSPITAL_BASED_OUTPATIENT_CLINIC_OR_DEPARTMENT_OTHER): Payer: 59 | Admitting: Psychiatry

## 2023-03-11 VITALS — Wt 191.0 lb

## 2023-03-11 DIAGNOSIS — R413 Other amnesia: Secondary | ICD-10-CM | POA: Diagnosis not present

## 2023-03-11 DIAGNOSIS — F200 Paranoid schizophrenia: Secondary | ICD-10-CM

## 2023-03-11 DIAGNOSIS — F411 Generalized anxiety disorder: Secondary | ICD-10-CM | POA: Diagnosis not present

## 2023-03-11 MED ORDER — PAROXETINE HCL 40 MG PO TABS: 40.0000 mg | ORAL_TABLET | Freq: Every morning | ORAL | 0 refills | Status: DC

## 2023-03-11 MED ORDER — CLONAZEPAM 1 MG PO TABS: 1.0000 mg | ORAL_TABLET | Freq: Every day | ORAL | 2 refills | Status: DC

## 2023-03-11 MED ORDER — ZIPRASIDONE HCL 80 MG PO CAPS: 80.0000 mg | ORAL_CAPSULE | Freq: Every day | ORAL | 0 refills | Status: DC

## 2023-03-11 MED ORDER — TRAZODONE HCL 100 MG PO TABS: 100.0000 mg | ORAL_TABLET | Freq: Every day | ORAL | 0 refills | Status: DC

## 2023-03-11 NOTE — Progress Notes (Addendum)
[facility] MD Virtual Progress Note   Patient Location: Home Provider Location: Home Office  I connect with patient by telephone and verified that I am speaking with correct person by using two identifiers. I discussed the limitations of evaluation and management by telemedicine and the availability of in person appointments. I also discussed with the patient that there may be a patient responsible charge related to this service. The patient expressed understanding and agreed to proceed.  Chelsea [name] [id number] 72 y.o.  03/11/2023 8:54 AM  History of Present Illness:  Patient is evaluated by phone session.  She could not do video session.  She reported things are unknown changed from the past.  She states to home and does not go outside unless doctor's appointment.  Her Thanksgiving was okay and she spent time with the sister and brother-in-law.  Patient told that she is still looking around to find a place where she can afford.  Her sister is primary caretaker but since brother-in-law diagnosed with Alzheimer sister is taking to downsize and moving to a smaller place but also sister does not want to leave the patient alone.  Today she is going to [employer] but also looking place where she can moved.  She admitted struggle with attention, concentration, memory and sometimes difficulty in her ADLs.  She is compliant with trazodone, Cogentin, Paxil and trazodone which is helping her sleep, paranoia and anxiety.  She reported her appetite is okay.  She like to keep the current medication.  She has mild tremors but does not interfere in her daily activities.  She denies any hallucination or any suicidal thoughts.  Past Psychiatric History: H/O inpatient at [facility] and [facility] for mania and psychosis.  No h/o suicidal attempt.     Outpatient Encounter Medications as of 03/11/2023  Medication Sig   acetaminophen (TYLENOL) 325 MG tablet Take 650 mg by mouth every 6  (six) hours as needed for moderate pain.   albuterol (PROVENTIL) (2.5 MG/3ML) 0.083% nebulizer solution Take 3 mLs (2.5 mg total) by nebulization every 6 (six) hours as needed for wheezing or shortness of breath.   amLODipine (NORVASC) 10 MG tablet Take 10 mg by mouth daily.   bumetanide (BUMEX) 1 MG tablet Take 1 tablet (1 mg total) by mouth daily.   Cholecalciferol (VITAMIN D3) 5000 units TABS Take 5,000 Units by mouth daily.   clonazePAM (KLONOPIN) 1 MG tablet Take 1 tablet (1 mg total) by mouth at bedtime.   diclofenac sodium (VOLTAREN) 1 % GEL Apply 2 g topically 4 (four) times daily as needed (pain).   Ensure Max Protein (ENSURE MAX PROTEIN) LIQD Take 330 mLs (11 oz total) by mouth 2 (two) times daily.   ferrous gluconate (FERGON) 324 MG tablet TAKE 1 TABLET BY MOUTH TWICE DAILY WITH MEALS   fluticasone (FLONASE) 50 MCG/ACT nasal spray Place 2 sprays into both nostrils daily. As needed   fluticasone furoate-vilanterol (BREO ELLIPTA) 200-25 MCG/INH AEPB Inhale 1 puff into the lungs every morning.   insulin aspart (NOVOLOG FLEXPEN) 100 UNIT/ML FlexPen Inject 2-12 Units into the skin 3 (three) times daily with meals. Sliding scale (Patient not taking: Reported on 12/29/2022)   LEVEMIR 100 UNIT/ML injection Inject 30 Units into the skin at bedtime.   levothyroxine (SYNTHROID) 100 MCG tablet Take 1 tablet (100 mcg total) by mouth daily before breakfast.   metFORMIN (GLUCOPHAGE) 500 MG tablet Take 500 mg by mouth in the morning and at bedtime.   metoprolol (  TOPROL-XL) 200 MG 24 hr tablet Take 200 mg by mouth daily.   montelukast (SINGULAIR) 10 MG tablet Take 10 mg by mouth daily.   Multiple Vitamin (MULTIVITAMIN WITH MINERALS) TABS tablet Take 1 tablet by mouth daily.   nystatin ointment (MYCOSTATIN) Apply 1 application  topically 2 (two) times daily as needed (for rashes).   omeprazole (PRILOSEC) 40 MG capsule Take 40 mg by mouth daily before breakfast.   ondansetron (ZOFRAN) 4 MG tablet Take 1  tablet (4 mg total) by mouth every 6 (six) hours as needed for nausea. (Patient not taking: Reported on 12/29/2022)   OZEMPIC, 2 MG/DOSE, 8 MG/3ML SOPN Inject 2 mg into the skin every Friday.   PARoxetine (PAXIL) 40 MG tablet Take 1 tablet (40 mg total) by mouth every morning.   polyethylene glycol (MIRALAX / GLYCOLAX) 17 g packet Take 17 g by mouth daily as needed for mild constipation.   rOPINIRole (REQUIP) 0.25 MG tablet TAKE 3 TABLETS BY MOUTH AT BEDTIME   rosuvastatin (CRESTOR) 10 MG tablet Take 10 mg by mouth at bedtime.   traZODone (DESYREL) 100 MG tablet Take 1 tablet (100 mg total) by mouth at bedtime.   TRUEPLUS INSULIN SYRINGE 29G X 1/2" 1 ML MISC 1 each by Other route daily.   VENTOLIN HFA 108 (90 Base) MCG/ACT inhaler Inhale 1-2 puffs into the lungs every 4 (four) hours as needed for wheezing or shortness of breath.   ziprasidone (GEODON) 80 MG capsule Take 1 capsule (80 mg total) by mouth at bedtime.   No facility-administered encounter medications on file as of 03/11/2023.    Recent Results (from the past 2160 hour(s))  Ferritin     Status: None   Collection Time: 12/29/22  9:47 AM  Result Value Ref Range   Ferritin 23 11 - 307 ng/mL    Comment: Performed at [facility], [address], [place], [place] [address]  CBC with Differential (Cancer Center Only)     Status: Abnormal   Collection Time: 12/29/22  9:48 AM  Result Value Ref Range   WBC Count 7.8 4.0 - 10.5 K/uL   RBC 4.10 3.87 - 5.11 MIL/uL   Hemoglobin 11.8 (L) 12.0 - 15.0 g/dL   HCT 60.4 54.0 - 98.1 %   MCV 88.0 80.0 - 100.0 fL   MCH 28.8 26.0 - 34.0 pg   MCHC 32.7 30.0 - 36.0 g/dL   RDW 19.1 47.8 - 29.5 %   Platelet Count 220 150 - 400 K/uL   nRBC 0.0 0.0 - 0.2 %   Neutrophils Relative % 69 %   Neutro Abs 5.3 1.7 - 7.7 K/uL   Lymphocytes Relative 17 %   Lymphs Abs 1.4 0.7 - 4.0 K/uL   Monocytes Relative 8 %   Monocytes Absolute 0.6 0.1 - 1.0 K/uL   Eosinophils Relative 6 %    Eosinophils Absolute 0.5 0.0 - 0.5 K/uL   Basophils Relative 0 %   Basophils Absolute 0.0 0.0 - 0.1 K/uL   Immature Granulocytes 0 %   Abs Immature Granulocytes 0.02 0.00 - 0.07 K/uL    Comment: Performed at [facility], [address] [place], [place] [address]  Basic Metabolic Panel - Cancer Center Only     Status: Abnormal   Collection Time: 12/29/22  9:48 AM  Result Value Ref Range   Sodium 139 135 - 145 mmol/L   Potassium 4.7 3.5 - 5.1 mmol/L   Chloride 106 98 - 111 mmol/L  CO2 26 22 - 32 mmol/L   Glucose, Bld 153 (H) 70 - 99 mg/dL    Comment: Glucose reference range applies only to samples taken after fasting for at least 8 hours.   BUN 34 (H) 8 - 23 mg/dL   Creatinine 1.61 (H) 0.96 - 1.00 mg/dL   Calcium 9.2 8.9 - 04.5 mg/dL   GFR, Estimated 44 (L) >60 mL/min    Comment: (NOTE) Calculated using the CKD-EPI Creatinine Equation (2021)    Anion gap 7 5 - 15    Comment: Performed at [facility], [address] [place], [place] [address]     Psychiatric Specialty Exam: Physical Exam  Review of Systems  Weight 191 lb (86.6 kg).There is no height or weight on file to calculate BMI.  General Appearance: NA  Eye Contact:  NA  Speech:  Slow  Volume:  Decreased  Mood:  Euthymic  Affect:  NA  Thought Process:  Descriptions of Associations: Intact  Orientation:  Full (Time, Place, and Person)  Thought Content:  Rumination  Suicidal Thoughts:  No  Homicidal Thoughts:  No  Memory:  Immediate;   Fair Recent;   Fair Remote;   Fair  Judgement:  Fair  Insight:  Present  Psychomotor Activity:  Decreased  Concentration:  Concentration: Fair and Attention Span: Fair  Recall:  [employer] of Knowledge:  Fair  Language:  Fair  Akathisia:  No  Handed:  Right  AIMS (if indicated):     Assets:  Communication Skills Desire for Improvement Housing Social Support  ADL's:  Impaired  Cognition:  Impaired,  Mild  Sleep:  ok, using Bipap      Assessment/Plan: Paranoid schizophrenia (HCC) - Plan: clonazePAM (KLONOPIN) 1 MG tablet, traZODone (DESYREL) 100 MG tablet, ziprasidone (GEODON) 80 MG capsule, PARoxetine (PAXIL) 40 MG tablet  Generalized anxiety disorder - Plan: clonazePAM (KLONOPIN) 1 MG tablet  Memory deficits - Plan: ziprasidone (GEODON) 80 MG capsule  Patient is stable on current medication.  Her biggest stress is finding a place/assisted living facility so she can afford.  She is anxious but also trying multiple senior citizen groups for programming.  [facility] she does not want to change the medication.  She feels the medicine helping.  We will continue Paxil 40 mg daily, Geodon 80 mg at bedtime, Klonopin 1 mg at bedtime and Paxil 40 mg daily.  Recommended to call us back if she has any question or any concern.  Follow-up in 3 months   Follow Up Instructions:     I discussed the assessment and treatment plan with the patient. The patient was provided an opportunity to ask questions and all were answered. The patient agreed with the plan and demonstrated an understanding of the instructions.   The patient was advised to call back or seek an in-person evaluation if the symptoms worsen or if the condition fails to improve as anticipated.    Collaboration of Care: Other provider involved in patient's care AEB notes are available in epic to review  Patient/Guardian was advised Release of Information must be obtained prior to any record release in order to collaborate their care with an outside provider. Patient/Guardian was advised if they have not already done so to contact the registration department to sign all necessary forms in order for [place] to release information regarding their care.   Consent: Patient/Guardian gives verbal consent for treatment and assignment of benefits for services provided during this visit. Patient/Guardian  expressed understanding and agreed to proceed.     I provided 17  minutes of non face to face time during this encounter.  Note: This document was prepared by [employer] voice dictation technology and any errors that results from this process are unintentional.    Cleotis Nipper, MD 03/11/2023

## 2023-03-11 NOTE — Addendum Note (Signed)
Addended by: Kathryne Sharper T on: 03/11/2023 09:19 AM   Modules accepted: Orders

## 2023-03-12 ENCOUNTER — Ambulatory Visit (HOSPITAL_COMMUNITY): Payer: 59 | Admitting: Psychiatry

## 2023-03-23 ENCOUNTER — Ambulatory Visit: Payer: 59 | Admitting: Podiatry

## 2023-03-30 ENCOUNTER — Inpatient Hospital Stay: Payer: 59

## 2023-03-30 ENCOUNTER — Inpatient Hospital Stay: Payer: 59 | Attending: Nurse Practitioner | Admitting: Nurse Practitioner

## 2023-03-30 ENCOUNTER — Other Ambulatory Visit: Payer: 59

## 2023-03-30 ENCOUNTER — Encounter: Payer: Self-pay | Admitting: Nurse Practitioner

## 2023-03-30 VITALS — BP 132/59 | HR 66 | Temp 97.9°F | Resp 18 | Ht 62.0 in | Wt 194.1 lb

## 2023-03-30 DIAGNOSIS — N189 Chronic kidney disease, unspecified: Secondary | ICD-10-CM | POA: Insufficient documentation

## 2023-03-30 DIAGNOSIS — F209 Schizophrenia, unspecified: Secondary | ICD-10-CM | POA: Diagnosis not present

## 2023-03-30 DIAGNOSIS — E1122 Type 2 diabetes mellitus with diabetic chronic kidney disease: Secondary | ICD-10-CM | POA: Diagnosis not present

## 2023-03-30 DIAGNOSIS — D509 Iron deficiency anemia, unspecified: Secondary | ICD-10-CM | POA: Diagnosis not present

## 2023-03-30 DIAGNOSIS — E039 Hypothyroidism, unspecified: Secondary | ICD-10-CM | POA: Diagnosis not present

## 2023-03-30 DIAGNOSIS — I129 Hypertensive chronic kidney disease with stage 1 through stage 4 chronic kidney disease, or unspecified chronic kidney disease: Secondary | ICD-10-CM | POA: Diagnosis not present

## 2023-03-30 DIAGNOSIS — M1990 Unspecified osteoarthritis, unspecified site: Secondary | ICD-10-CM | POA: Insufficient documentation

## 2023-03-30 DIAGNOSIS — F32A Depression, unspecified: Secondary | ICD-10-CM | POA: Diagnosis not present

## 2023-03-30 DIAGNOSIS — E7800 Pure hypercholesterolemia, unspecified: Secondary | ICD-10-CM | POA: Diagnosis not present

## 2023-03-30 DIAGNOSIS — G4730 Sleep apnea, unspecified: Secondary | ICD-10-CM | POA: Insufficient documentation

## 2023-03-30 DIAGNOSIS — J449 Chronic obstructive pulmonary disease, unspecified: Secondary | ICD-10-CM | POA: Insufficient documentation

## 2023-03-30 LAB — CBC WITH DIFFERENTIAL (CANCER CENTER ONLY)
Abs Immature Granulocytes: 0.01 10*3/uL (ref 0.00–0.07)
Basophils Absolute: 0 10*3/uL (ref 0.0–0.1)
Basophils Relative: 0 %
Eosinophils Absolute: 0.3 10*3/uL (ref 0.0–0.5)
Eosinophils Relative: 4 %
HCT: 36.3 % (ref 36.0–46.0)
Hemoglobin: 11.7 g/dL — ABNORMAL LOW (ref 12.0–15.0)
Immature Granulocytes: 0 %
Lymphocytes Relative: 24 %
Lymphs Abs: 1.4 10*3/uL (ref 0.7–4.0)
MCH: 28.4 pg (ref 26.0–34.0)
MCHC: 32.2 g/dL (ref 30.0–36.0)
MCV: 88.1 fL (ref 80.0–100.0)
Monocytes Absolute: 0.6 10*3/uL (ref 0.1–1.0)
Monocytes Relative: 9 %
Neutro Abs: 3.7 10*3/uL (ref 1.7–7.7)
Neutrophils Relative %: 63 %
Platelet Count: 241 10*3/uL (ref 150–400)
RBC: 4.12 MIL/uL (ref 3.87–5.11)
RDW: 12.8 % (ref 11.5–15.5)
WBC Count: 6 10*3/uL (ref 4.0–10.5)
nRBC: 0 % (ref 0.0–0.2)

## 2023-03-30 LAB — FERRITIN: Ferritin: 27 ng/mL (ref 11–307)

## 2023-04-02 ENCOUNTER — Telehealth: Payer: Self-pay

## 2023-04-02 NOTE — Telephone Encounter (Signed)
 Patient's sister gave verbal understanding and hd no further questions or concerns

## 2023-04-02 NOTE — Telephone Encounter (Signed)
-----   Message from Lonna Cobb sent at 03/31/2023  4:47 PM EST ----- Please let her know ferritin is stable in low normal range.  Continue oral iron.  Follow-up as scheduled.

## 2023-04-29 ENCOUNTER — Ambulatory Visit: Payer: 59 | Admitting: Podiatry

## 2023-05-19 ENCOUNTER — Other Ambulatory Visit (HOSPITAL_COMMUNITY): Payer: Self-pay | Admitting: Psychiatry

## 2023-05-19 DIAGNOSIS — R413 Other amnesia: Secondary | ICD-10-CM

## 2023-05-19 DIAGNOSIS — F411 Generalized anxiety disorder: Secondary | ICD-10-CM

## 2023-05-19 DIAGNOSIS — F200 Paranoid schizophrenia: Secondary | ICD-10-CM

## 2023-06-08 ENCOUNTER — Encounter (HOSPITAL_COMMUNITY): Payer: Self-pay | Admitting: Psychiatry

## 2023-06-08 ENCOUNTER — Telehealth (HOSPITAL_BASED_OUTPATIENT_CLINIC_OR_DEPARTMENT_OTHER): Payer: 59 | Admitting: Psychiatry

## 2023-06-08 VITALS — Wt 194.0 lb

## 2023-06-08 DIAGNOSIS — R413 Other amnesia: Secondary | ICD-10-CM | POA: Diagnosis not present

## 2023-06-08 DIAGNOSIS — F200 Paranoid schizophrenia: Secondary | ICD-10-CM

## 2023-06-08 DIAGNOSIS — F411 Generalized anxiety disorder: Secondary | ICD-10-CM

## 2023-06-08 MED ORDER — PAROXETINE HCL 40 MG PO TABS
40.0000 mg | ORAL_TABLET | Freq: Every morning | ORAL | 0 refills | Status: DC
Start: 2023-06-08 — End: 2023-09-01

## 2023-06-08 MED ORDER — CLONAZEPAM 1 MG PO TABS: 1.0000 mg | ORAL_TABLET | Freq: Every day | ORAL | 2 refills | Status: DC

## 2023-06-08 MED ORDER — TRAZODONE HCL 100 MG PO TABS: 100.0000 mg | ORAL_TABLET | Freq: Every day | ORAL | 0 refills | Status: DC

## 2023-06-08 MED ORDER — ZIPRASIDONE HCL 80 MG PO CAPS: 80.0000 mg | ORAL_CAPSULE | Freq: Every day | ORAL | 0 refills | Status: DC

## 2023-06-08 NOTE — Progress Notes (Signed)
 [facility] MD Virtual Progress Note   Patient Location: Home Provider Location: Home Office  I connect with patient by telephone and verified that I am speaking with correct person by using two identifiers. I discussed the limitations of evaluation and management by telemedicine and the availability of in person appointments. I also discussed with the patient that there may be a patient responsible charge related to this service. The patient expressed understanding and agreed to proceed.  Chelsea [name] [id number] 73 y.o.  06/08/2023 2:57 PM  History of Present Illness:  Patient is evaluated by phone session.  She had no means to do video session at this time.  Patient told her sister is now moving out because brother-in-law memory is deteriorating.  Patient did visit a few places her future living but did not like any of them.  Her plan is to stay by herself until she find an apartment.  Patient had applied for transportation and she is hoping once it is approved then it would be much easier for her for doctors appointment.  Her pharmacy deliver the medication at home.  She wants to keep the current medicine since it is working well.  She denies any paranoia, hallucination, panic attack, crying spells.  Her sleep is much better and she is no longer required to use BiPAP.  Her appetite is okay.  Her weight is stable.  She denies any hopelessness or worthlessness.  She does not get overwhelmed and like to keep the current medication.  Recently she had a visit with her doctor and had CBC which was normal.  She has mild tremors but does not interfere in her daily activities.  She is taking Paxil, Geodon, trazodone, Klonopin and trazodone.  [facility] care is [facility] which makes home visit and nurse comes to visit her who helps make the pillbox.  Past Psychiatric History: H/O inpatient at [facility] for mania and psychosis.  No h/o suicidal attempt.      Outpatient Encounter Medications as of 06/08/2023  Medication Sig   acetaminophen (TYLENOL) 325 MG tablet Take 650 mg by mouth every 6 (six) hours as needed for moderate pain.   albuterol (PROVENTIL) (2.5 MG/3ML) 0.083% nebulizer solution Take 3 mLs (2.5 mg total) by nebulization every 6 (six) hours as needed for wheezing or shortness of breath.   amLODipine (NORVASC) 10 MG tablet Take 10 mg by mouth daily.   bumetanide (BUMEX) 1 MG tablet Take 1 tablet (1 mg total) by mouth daily.   Cholecalciferol (VITAMIN D3) 5000 units TABS Take 5,000 Units by mouth daily.   clonazePAM (KLONOPIN) 1 MG tablet Take 1 tablet (1 mg total) by mouth at bedtime.   diclofenac sodium (VOLTAREN) 1 % GEL Apply 2 g topically 4 (four) times daily as needed (pain).   Ensure Max Protein (ENSURE MAX PROTEIN) LIQD Take 330 mLs (11 oz total) by mouth 2 (two) times daily.   ferrous gluconate (FERGON) 324 MG tablet TAKE 1 TABLET BY MOUTH TWICE DAILY WITH MEALS   fluticasone (FLONASE) 50 MCG/ACT nasal spray Place 2 sprays into both nostrils daily. As needed   fluticasone furoate-vilanterol (BREO ELLIPTA) 200-25 MCG/INH AEPB Inhale 1 puff into the lungs every morning.   insulin aspart (NOVOLOG FLEXPEN) 100 UNIT/ML FlexPen Inject 2-12 Units into the skin 3 (three) times daily with meals. Sliding scale (Patient not taking: Reported on 03/30/2023)   LEVEMIR 100 UNIT/ML injection Inject 30 Units into the skin at bedtime.  levothyroxine (SYNTHROID) 100 MCG tablet Take 1 tablet (100 mcg total) by mouth daily before breakfast.   metFORMIN (GLUCOPHAGE) 500 MG tablet Take 500 mg by mouth in the morning and at bedtime.   metoprolol (TOPROL-XL) 200 MG 24 hr tablet Take 200 mg by mouth daily.   montelukast (SINGULAIR) 10 MG tablet Take 10 mg by mouth daily.   Multiple Vitamin (MULTIVITAMIN WITH MINERALS) TABS tablet Take 1 tablet by mouth daily.   nystatin ointment (MYCOSTATIN) Apply 1 application  topically 2 (two) times daily as  needed (for rashes).   omeprazole (PRILOSEC) 40 MG capsule Take 40 mg by mouth daily before breakfast.   ondansetron (ZOFRAN) 4 MG tablet Take 1 tablet (4 mg total) by mouth every 6 (six) hours as needed for nausea. (Patient not taking: Reported on 12/29/2022)   OZEMPIC, 2 MG/DOSE, 8 MG/3ML SOPN Inject 2 mg into the skin every Friday.   PARoxetine (PAXIL) 40 MG tablet Take 1 tablet (40 mg total) by mouth every morning.   polyethylene glycol (MIRALAX / GLYCOLAX) 17 g packet Take 17 g by mouth daily as needed for mild constipation.   rOPINIRole (REQUIP) 0.25 MG tablet TAKE 3 TABLETS BY MOUTH AT BEDTIME   rosuvastatin (CRESTOR) 10 MG tablet Take 10 mg by mouth at bedtime.   traZODone (DESYREL) 100 MG tablet Take 1 tablet (100 mg total) by mouth at bedtime.   TRUEPLUS INSULIN SYRINGE 29G X 1/2" 1 ML MISC 1 each by Other route daily.   VENTOLIN HFA 108 (90 Base) MCG/ACT inhaler Inhale 1-2 puffs into the lungs every 4 (four) hours as needed for wheezing or shortness of breath.   ziprasidone (GEODON) 80 MG capsule Take 1 capsule (80 mg total) by mouth at bedtime.   No facility-administered encounter medications on file as of 06/08/2023.    Recent Results (from the past 2160 hours)  Ferritin     Status: None   Collection Time: 03/30/23 11:36 AM  Result Value Ref Range   Ferritin 27 11 - 307 ng/mL    Comment: Performed at [facility], [address] [place], [place] [address]  CBC with Differential (Cancer Center Only)     Status: Abnormal   Collection Time: 03/30/23 11:36 AM  Result Value Ref Range   WBC Count 6.0 4.0 - 10.5 K/uL   RBC 4.12 3.87 - 5.11 MIL/uL   Hemoglobin 11.7 (L) 12.0 - 15.0 g/dL   HCT 60.4 54.0 - 98.1 %   MCV 88.1 80.0 - 100.0 fL   MCH 28.4 26.0 - 34.0 pg   MCHC 32.2 30.0 - 36.0 g/dL   RDW 19.1 47.8 - 29.5 %   Platelet Count 241 150 - 400 K/uL   nRBC 0.0 0.0 - 0.2 %   Neutrophils Relative % 63 %   Neutro Abs 3.7 1.7 - 7.7 K/uL   Lymphocytes Relative  24 %   Lymphs Abs 1.4 0.7 - 4.0 K/uL   Monocytes Relative 9 %   Monocytes Absolute 0.6 0.1 - 1.0 K/uL   Eosinophils Relative 4 %   Eosinophils Absolute 0.3 0.0 - 0.5 K/uL   Basophils Relative 0 %   Basophils Absolute 0.0 0.0 - 0.1 K/uL   Immature Granulocytes 0 %   Abs Immature Granulocytes 0.01 0.00 - 0.07 K/uL    Comment: Performed at [facility], [address] [place], [place] [address]     Psychiatric Specialty Exam: Physical Exam  Review of Systems  Weight 194 lb (88  kg).There is no height or weight on file to calculate BMI.  General Appearance: NA  Eye Contact:  NA  Speech:  Slow  Volume:  Decreased  Mood:  Dysphoric  Affect:  NA  Thought Process:  Descriptions of Associations: Intact  Orientation:  Full (Time, Place, and Person)  Thought Content:  Logical  Suicidal Thoughts:  No  Homicidal Thoughts:  No  Memory:  Immediate;   Fair Recent;   Fair Remote;   Fair  Judgement:  Fair  Insight:  Present  Psychomotor Activity:  Decreased  Concentration:  Concentration: Fair and Attention Span: Fair  Recall:  Good  Fund of Knowledge:  Good  Language:  Good  Akathisia:  No  Handed:  Right  AIMS (if indicated):     Assets:  Communication Skills Desire for Improvement Housing  ADL's:  Intact  Cognition:  WNL  Sleep:  ok not required BIPAP     Assessment/Plan: Paranoid schizophrenia (HCC) - Plan: PARoxetine (PAXIL) 40 MG tablet, traZODone (DESYREL) 100 MG tablet, ziprasidone (GEODON) 80 MG capsule, clonazePAM (KLONOPIN) 1 MG tablet  Memory deficits - Plan: ziprasidone (GEODON) 80 MG capsule  Generalized anxiety disorder - Plan: clonazePAM (KLONOPIN) 1 MG tablet  Patient is stable on current medication.  She had decided once sister and brother-in-law moved out that she will stay by herself as well as she can handle and then if needed then she will moved to an apartment.  [facility] visit and the nurse comes  regularly and manages the medication.  Her medications are delivered from the pharmacy.  She had applied for [facility] service that will help transport to the doctor's appointment.  She does not want to change the medication since it is working well.  She has no major concern.  Her sleep is improved and she does not need BiPAP anymore.  Continue Geodon 80 mg at bedtime, Klonopin 1 mg at bedtime, Paxil 40 mg daily and trazodone 1 mg at bedtime.  Discussed polypharmacy with patient feel comfortable with the current doses.  Recommend to call us back if she has any question or any concern.  Follow-up in 3 months.   Follow Up Instructions:     I discussed the assessment and treatment plan with the patient. The patient was provided an opportunity to ask questions and all were answered. The patient agreed with the plan and demonstrated an understanding of the instructions.   The patient was advised to call back or seek an in-person evaluation if the symptoms worsen or if the condition fails to improve as anticipated.    Collaboration of Care: Other provider involved in patient's care AEB notes are available in epic to review  Patient/Guardian was advised Release of Information must be obtained prior to any record release in order to collaborate their care with an outside provider. Patient/Guardian was advised if they have not already done so to contact the registration department to sign all necessary forms in order for [place] to release information regarding their care.   Consent: Patient/Guardian gives verbal consent for treatment and assignment of benefits for services provided during this visit. Patient/Guardian expressed understanding and agreed to proceed.     I provided 16 minutes of non face to face time during this encounter.  Note: This document was prepared by [employer] voice dictation technology and any errors that results from this process are unintentional.    Cleotis Nipper, MD 06/08/2023

## 2023-06-16 ENCOUNTER — Other Ambulatory Visit: Payer: Self-pay | Admitting: Oncology

## 2023-06-16 DIAGNOSIS — D509 Iron deficiency anemia, unspecified: Secondary | ICD-10-CM

## 2023-06-22 ENCOUNTER — Ambulatory Visit: Payer: 59 | Admitting: Podiatry

## 2023-06-23 ENCOUNTER — Ambulatory Visit: Payer: 59 | Admitting: Podiatry

## 2023-06-29 ENCOUNTER — Inpatient Hospital Stay: Payer: 59 | Attending: Oncology

## 2023-06-29 ENCOUNTER — Inpatient Hospital Stay: Payer: 59 | Admitting: Oncology

## 2023-07-03 ENCOUNTER — Telehealth: Payer: Self-pay | Admitting: Oncology

## 2023-07-03 NOTE — Telephone Encounter (Signed)
 Called pt to reschedule missed 3/31 appts. Pt declined rescheduling at this time due to transportation issues. Pt instructed to call if/when she was ready to schedule.

## 2023-08-10 ENCOUNTER — Inpatient Hospital Stay: Attending: Oncology | Admitting: Oncology

## 2023-08-10 ENCOUNTER — Other Ambulatory Visit: Payer: Self-pay | Admitting: *Deleted

## 2023-08-10 ENCOUNTER — Inpatient Hospital Stay: Attending: Oncology

## 2023-08-10 ENCOUNTER — Encounter: Payer: Self-pay | Admitting: Oncology

## 2023-08-10 VITALS — BP 139/71 | HR 77 | Resp 18 | Ht 62.0 in | Wt 207.2 lb

## 2023-08-10 DIAGNOSIS — R21 Rash and other nonspecific skin eruption: Secondary | ICD-10-CM | POA: Insufficient documentation

## 2023-08-10 DIAGNOSIS — D509 Iron deficiency anemia, unspecified: Secondary | ICD-10-CM | POA: Insufficient documentation

## 2023-08-10 LAB — CBC WITH DIFFERENTIAL (CANCER CENTER ONLY)
Abs Immature Granulocytes: 0.01 10*3/uL (ref 0.00–0.07)
Basophils Absolute: 0 10*3/uL (ref 0.0–0.1)
Basophils Relative: 0 %
Eosinophils Absolute: 0.1 10*3/uL (ref 0.0–0.5)
Eosinophils Relative: 2 %
HCT: 33.7 % — ABNORMAL LOW (ref 36.0–46.0)
Hemoglobin: 11.1 g/dL — ABNORMAL LOW (ref 12.0–15.0)
Immature Granulocytes: 0 %
Lymphocytes Relative: 24 %
Lymphs Abs: 1.6 10*3/uL (ref 0.7–4.0)
MCH: 28.8 pg (ref 26.0–34.0)
MCHC: 32.9 g/dL (ref 30.0–36.0)
MCV: 87.3 fL (ref 80.0–100.0)
Monocytes Absolute: 0.7 10*3/uL (ref 0.1–1.0)
Monocytes Relative: 10 %
Neutro Abs: 4.1 10*3/uL (ref 1.7–7.7)
Neutrophils Relative %: 64 %
Platelet Count: 225 10*3/uL (ref 150–400)
RBC: 3.86 MIL/uL — ABNORMAL LOW (ref 3.87–5.11)
RDW: 12.7 % (ref 11.5–15.5)
WBC Count: 6.5 10*3/uL (ref 4.0–10.5)
nRBC: 0 % (ref 0.0–0.2)

## 2023-08-10 LAB — FERRITIN: Ferritin: 58 ng/mL (ref 11–307)

## 2023-08-10 MED ORDER — NYSTATIN 100000 UNIT/GM EX POWD: 1.0000 | Freq: Two times a day (BID) | CUTANEOUS | 0 refills | Status: AC

## 2023-08-10 NOTE — Progress Notes (Signed)
  [facility] OFFICE PROGRESS NOTE   Diagnosis: Iron deficiency anemia  INTERVAL HISTORY:   Chelsea [name] returns as scheduled.  She feels well.  No complaint.  She continues iron twice daily.  No bleeding.  She craves ice.  She uses nystatin  ointment on the groin rash.  Objective:  Vital signs in last 24 hours:  Blood pressure 139/71, pulse 77, resp. rate 18, height 5\' 2"  (1.575 m), weight 207 lb 3.2 oz (94 kg), SpO2 98%.    Lymphatics: No cervical, supraclavicular, axillary, or inguinal nodes Resp: Distant breath sounds, no respiratory distress Cardio: Regular rate and rhythm GI: No hepatosplenomegaly Vascular: No leg edema  Skin: Moist yeast rash at the left breast skin fold and in the right groin    Lab Results:  Lab Results  Component Value Date   WBC 6.5 08/10/2023   HGB 11.1 (L) 08/10/2023   HCT 33.7 (L) 08/10/2023   MCV 87.3 08/10/2023   PLT 225 08/10/2023   NEUTROABS 4.1 08/10/2023    CMP  Lab Results  Component Value Date   NA 139 12/29/2022   K 4.7 12/29/2022   CL 106 12/29/2022   CO2 26 12/29/2022   GLUCOSE 153 (H) 12/29/2022   BUN 34 (H) 12/29/2022   CREATININE 1.31 (H) 12/29/2022   CALCIUM  9.2 12/29/2022   PROT 5.3 (L) 02/28/2022   ALBUMIN  2.6 (L) 02/28/2022   AST 29 02/28/2022   ALT 20 02/28/2022   ALKPHOS 84 02/28/2022   BILITOT 0.6 02/28/2022   GFRNONAA 44 (L) 12/29/2022   GFRAA 38 (L) 06/02/2016     Medications: I have reviewed the patient's current medications.   Assessment/Plan: Iron deficiency anemia Admission with GI bleed July 2022, December 2022 09/28/2020 EGD-esophageal plaques consistent with candidiasis, gastritis 09/29/2020 colonoscopy-internal hemorrhoids, 2 small polyps in the mid transverse colon and distal ascending colon 06/05/2022 capsule endoscopy-normal Chronic kidney disease COPD Sleep apnea Depression Schizophrenia Diabetes Hypertension Hypothyroid Elevated cholesterol Osteoarthritis      Disposition: Chelsea [name] has a history of iron deficiency anemia.  The hemoglobin remains mildly low, but has not changed significantly over the past year.  She will continue iron twice daily.  Will follow-up on the ferritin level from today.  She will return for an office and lab visit in 4 months. Chelsea [name] has a yeast rash at the left breast skin fold and in the right groin.  She will try nystatin  powder.  She will keep the area dry and use baby powder when she completes treatment with nystatin .  She should follow-up with her primary provider if the rash does not improve.  Coni Deep, MD  08/10/2023  2:19 PM

## 2023-08-11 ENCOUNTER — Ambulatory Visit: Payer: Self-pay | Admitting: Oncology

## 2023-08-11 ENCOUNTER — Telehealth: Payer: Self-pay | Admitting: Oncology

## 2023-08-11 ENCOUNTER — Ambulatory Visit: Admitting: Podiatry

## 2023-08-11 NOTE — Telephone Encounter (Signed)
-----   Message from Coni Deep sent at 08/11/2023  2:45 PM EDT ----- Please call patient iron level is normal, continue iron, follow-up as scheduled

## 2023-08-11 NOTE — Telephone Encounter (Signed)
 Patient gave verbal understanding and had no more questions.

## 2023-08-11 NOTE — Telephone Encounter (Signed)
 Patient has been scheduled for follow-up visit per 08/06/23 LOS.  Pt noted appt details on personal planner/calendar.

## 2023-08-12 ENCOUNTER — Other Ambulatory Visit (HOSPITAL_COMMUNITY): Payer: Self-pay | Admitting: Psychiatry

## 2023-08-12 DIAGNOSIS — F200 Paranoid schizophrenia: Secondary | ICD-10-CM

## 2023-08-12 DIAGNOSIS — R413 Other amnesia: Secondary | ICD-10-CM

## 2023-08-12 DIAGNOSIS — F411 Generalized anxiety disorder: Secondary | ICD-10-CM

## 2023-08-18 ENCOUNTER — Encounter: Payer: Self-pay | Admitting: Podiatry

## 2023-08-18 ENCOUNTER — Ambulatory Visit (INDEPENDENT_AMBULATORY_CARE_PROVIDER_SITE_OTHER): Admitting: Podiatry

## 2023-08-18 DIAGNOSIS — B351 Tinea unguium: Secondary | ICD-10-CM

## 2023-08-18 DIAGNOSIS — M79674 Pain in right toe(s): Secondary | ICD-10-CM | POA: Diagnosis not present

## 2023-08-18 DIAGNOSIS — M79675 Pain in left toe(s): Secondary | ICD-10-CM

## 2023-08-18 DIAGNOSIS — E119 Type 2 diabetes mellitus without complications: Secondary | ICD-10-CM

## 2023-08-18 NOTE — Progress Notes (Signed)
This patient returns to my office for at risk foot care.  This patient requires this care by a professional since this patient will be at risk due to having diabetes.  This patient is unable to cut nails herself since the patient cannot reach her nails.These nails are painful walking and wearing shoes.  This patient presents for at risk foot care today.  General Appearance  Alert, conversant and in no acute stress.  Vascular  Dorsalis pedis  are palpable  Bilaterally. Posterior tibial pulses are not palpable  B/L.  Capillary return is within normal limits  Bilaterally.  Cold feet.  Bilaterally. Absent hair.  Neurologic  Senn-Weinstein monofilament wire test within normal limits  bilaterally. Muscle power within normal limits bilaterally.  Nails Thick disfigured discolored nails with subungual debris  Hallux nails bilaterally. No evidence of bacterial infection or drainage bilaterally.  Orthopedic  No limitations of motion  feet .  No crepitus or effusions noted.  No bony pathology or digital deformities noted.  Skin  normotropic skin with no porokeratosis noted bilaterally.  No signs of infections or ulcers noted.     Onychomycosis  Pain in right toe  Pain in left toe.  Consent was obtained for treatment procedures.  Debridement and grinding of long thick nails with clearing of subungual debris.  No infection or ulcer.     Return office visit   4 months.       Told patient to return for periodic foot care and evaluation due to potential at risk complications.   Helane Gunther DPM

## 2023-09-01 ENCOUNTER — Other Ambulatory Visit (HOSPITAL_COMMUNITY): Payer: Self-pay

## 2023-09-01 DIAGNOSIS — F200 Paranoid schizophrenia: Secondary | ICD-10-CM

## 2023-09-01 DIAGNOSIS — F411 Generalized anxiety disorder: Secondary | ICD-10-CM

## 2023-09-01 DIAGNOSIS — R413 Other amnesia: Secondary | ICD-10-CM

## 2023-09-01 MED ORDER — ZIPRASIDONE HCL 80 MG PO CAPS: 80.0000 mg | ORAL_CAPSULE | Freq: Every day | ORAL | 0 refills | Status: DC

## 2023-09-01 MED ORDER — PAROXETINE HCL 40 MG PO TABS: 40.0000 mg | ORAL_TABLET | Freq: Every morning | ORAL | 0 refills | Status: DC

## 2023-09-01 MED ORDER — CLONAZEPAM 1 MG PO TABS: 1.0000 mg | ORAL_TABLET | Freq: Every day | ORAL | 0 refills | Status: DC

## 2023-09-04 ENCOUNTER — Other Ambulatory Visit (HOSPITAL_COMMUNITY): Payer: Self-pay | Admitting: Psychiatry

## 2023-09-04 DIAGNOSIS — F411 Generalized anxiety disorder: Secondary | ICD-10-CM

## 2023-09-04 DIAGNOSIS — R413 Other amnesia: Secondary | ICD-10-CM

## 2023-09-04 DIAGNOSIS — F200 Paranoid schizophrenia: Secondary | ICD-10-CM

## 2023-09-15 ENCOUNTER — Telehealth (HOSPITAL_COMMUNITY): Admitting: Psychiatry

## 2023-09-17 ENCOUNTER — Telehealth (HOSPITAL_COMMUNITY): Admitting: Psychiatry

## 2023-09-30 ENCOUNTER — Other Ambulatory Visit (HOSPITAL_COMMUNITY): Payer: Self-pay | Admitting: *Deleted

## 2023-09-30 DIAGNOSIS — F411 Generalized anxiety disorder: Secondary | ICD-10-CM

## 2023-09-30 DIAGNOSIS — F200 Paranoid schizophrenia: Secondary | ICD-10-CM

## 2023-09-30 DIAGNOSIS — R413 Other amnesia: Secondary | ICD-10-CM

## 2023-09-30 MED ORDER — CLONAZEPAM 1 MG PO TABS: 1.0000 mg | ORAL_TABLET | Freq: Every day | ORAL | 0 refills | Status: DC

## 2023-09-30 MED ORDER — PAROXETINE HCL 40 MG PO TABS: 40.0000 mg | ORAL_TABLET | Freq: Every morning | ORAL | 0 refills | Status: DC

## 2023-09-30 MED ORDER — ZIPRASIDONE HCL 80 MG PO CAPS: 80.0000 mg | ORAL_CAPSULE | Freq: Every day | ORAL | 0 refills | Status: DC

## 2023-10-01 ENCOUNTER — Other Ambulatory Visit: Payer: Self-pay | Admitting: Adult Health

## 2023-10-01 NOTE — Telephone Encounter (Signed)
 Pt has appt 10-14-2023.  Should have enough refills until 10-2023

## 2023-10-06 ENCOUNTER — Telehealth (HOSPITAL_COMMUNITY): Admitting: Psychiatry

## 2023-10-06 ENCOUNTER — Encounter (HOSPITAL_COMMUNITY): Payer: Self-pay | Admitting: Psychiatry

## 2023-10-06 DIAGNOSIS — R413 Other amnesia: Secondary | ICD-10-CM | POA: Diagnosis not present

## 2023-10-06 DIAGNOSIS — F411 Generalized anxiety disorder: Secondary | ICD-10-CM

## 2023-10-06 DIAGNOSIS — F200 Paranoid schizophrenia: Secondary | ICD-10-CM

## 2023-10-06 MED ORDER — ZIPRASIDONE HCL 80 MG PO CAPS: 80.0000 mg | ORAL_CAPSULE | Freq: Every day | ORAL | 0 refills | Status: DC

## 2023-10-06 MED ORDER — TRAZODONE HCL 100 MG PO TABS
100.0000 mg | ORAL_TABLET | Freq: Every day | ORAL | 0 refills | Status: DC
Start: 2023-10-06 — End: 2024-01-07

## 2023-10-06 MED ORDER — PAROXETINE HCL 40 MG PO TABS
40.0000 mg | ORAL_TABLET | Freq: Every morning | ORAL | 0 refills | Status: DC
Start: 2023-10-06 — End: 2024-01-07

## 2023-10-06 MED ORDER — CLONAZEPAM 1 MG PO TABS: 1.0000 mg | ORAL_TABLET | Freq: Every day | ORAL | 0 refills | Status: DC

## 2023-10-06 NOTE — Progress Notes (Signed)
 [facility] MD Virtual Progress Note   Patient Location: Home Provider Location: Home Office  I connect with patient by telephone and verified that I am speaking with correct person by using two identifiers. I discussed the limitations of evaluation and management by telemedicine and the availability of in person appointments. I also discussed with the patient that there may be a patient responsible charge related to this service. The patient expressed understanding and agreed to proceed.  Chelsea [name] [id number] 73 y.o.  10/06/2023 9:11 AM  History of Present Illness:  Patient evaluated by phone session.  She cannot do video because she does not have technology and camera.  Now her sister and brother-in-law moved and she moved to an apartment nearby.  She has SCAT service and visiting nurse from her insurance company.  She reports things are going okay and she denies any paranoia, hallucination or any irritability.  She sleeps okay.  Recently she has a visit to podiatrist.  She reported few pounds weight gain.  She is sleeping without BiPAP.  She has mild tremors but does not interfere in her daily activities.  She denies any irritability, mania, crying spells or any feeling of hopelessness or worthlessness.  She like to keep the Paxil , Geodon , Klonopin  and trazodone .  Patient is very limited to go outside.  She has no transportation.  Her primary care visits at her place and her pharmacy delivers the medication.  She mention a physician from [facility] coming tomorrow for physical and will have blood work.  She also checks her blood sugar regularly and reported it is under control.  She denies any highs and lows in her mood.  She denies any panic attack.  She is comfortable with the current medication.  Past Psychiatric History: H/O inpatient at [facility] and [facility] for mania and psychosis.  No h/o suicidal attempt.     Outpatient Encounter Medications as of  10/06/2023  Medication Sig   acetaminophen  (TYLENOL ) 325 MG tablet Take 650 mg by mouth every 6 (six) hours as needed for moderate pain.   albuterol  (PROVENTIL ) (2.5 MG/3ML) 0.083% nebulizer solution Take 3 mLs (2.5 mg total) by nebulization every 6 (six) hours as needed for wheezing or shortness of breath.   amLODipine  (NORVASC ) 10 MG tablet Take 10 mg by mouth daily.   bumetanide  (BUMEX ) 1 MG tablet Take 1 tablet (1 mg total) by mouth daily.   Cholecalciferol  (VITAMIN D3) 5000 units TABS Take 5,000 Units by mouth daily.   clonazePAM  (KLONOPIN ) 1 MG tablet Take 1 tablet (1 mg total) by mouth at bedtime. Bridge to f/u 6/17   diclofenac sodium (VOLTAREN) 1 % GEL Apply 2 g topically 4 (four) times daily as needed (pain).   Ensure Max Protein (ENSURE MAX PROTEIN) LIQD Take 330 mLs (11 oz total) by mouth 2 (two) times daily.   ferrous gluconate  (FERGON) 324 MG tablet TAKE 1 TABLET BY MOUTH TWICE DAILY WITH MEALS   fluticasone  (FLONASE ) 50 MCG/ACT nasal spray Place 2 sprays into both nostrils daily. As needed   fluticasone  furoate-vilanterol (BREO ELLIPTA ) 200-25 MCG/INH AEPB Inhale 1 puff into the lungs every morning.   insulin  aspart (NOVOLOG  FLEXPEN) 100 UNIT/ML FlexPen Inject 2-12 Units into the skin 3 (three) times daily with meals. Sliding scale   LANTUS  100 UNIT/ML injection 35 Units at bedtime.   LEVEMIR  100 UNIT/ML injection Inject 30 Units into the skin at bedtime.   levothyroxine  (SYNTHROID ) 100 MCG tablet Take 1 tablet (100  mcg total) by mouth daily before breakfast.   metFORMIN (GLUCOPHAGE) 500 MG tablet Take 500 mg by mouth in the morning and at bedtime.   metoprolol  (TOPROL -XL) 200 MG 24 hr tablet Take 200 mg by mouth daily.   montelukast  (SINGULAIR ) 10 MG tablet Take 10 mg by mouth daily.   Multiple Vitamin (MULTIVITAMIN WITH MINERALS) TABS tablet Take 1 tablet by mouth daily.   nystatin  (MYCOSTATIN /NYSTOP ) powder Apply 1 Application topically 2 (two) times daily. Apply to rash under  breast and groin rash. Follow up with PCP for further refills   omeprazole (PRILOSEC) 40 MG capsule Take 40 mg by mouth daily before breakfast.   ondansetron  (ZOFRAN ) 4 MG tablet Take 1 tablet (4 mg total) by mouth every 6 (six) hours as needed for nausea.   OZEMPIC, 2 MG/DOSE, 8 MG/3ML SOPN Inject 2 mg into the skin every Friday.   PARoxetine  (PAXIL ) 40 MG tablet Take 1 tablet (40 mg total) by mouth every morning. Bridge to f/u 6/17   polyethylene glycol (MIRALAX  / GLYCOLAX ) 17 g packet Take 17 g by mouth daily as needed for mild constipation.   rOPINIRole  (REQUIP ) 0.25 MG tablet TAKE 3 TABLETS BY MOUTH AT BEDTIME   rosuvastatin  (CRESTOR ) 10 MG tablet Take 10 mg by mouth at bedtime.   traZODone  (DESYREL ) 100 MG tablet Take 1 tablet (100 mg total) by mouth at bedtime.   TRUEPLUS INSULIN  SYRINGE 29G X 1/2 1 ML MISC 1 each by Other route daily.   VENTOLIN  HFA 108 (90 Base) MCG/ACT inhaler Inhale 1-2 puffs into the lungs every 4 (four) hours as needed for wheezing or shortness of breath.   ziprasidone  (GEODON ) 80 MG capsule Take 1 capsule (80 mg total) by mouth at bedtime. Bridge to f/u 6/17   No facility-administered encounter medications on file as of 10/06/2023.    Recent Results (from the past 2160 hours)  Ferritin     Status: None   Collection Time: 08/10/23  1:26 PM  Result Value Ref Range   Ferritin 58 11 - 307 ng/mL    Comment: Performed at [facility], [address] [place], [place] [address]  CBC with Differential (Cancer Center Only)     Status: Abnormal   Collection Time: 08/10/23  1:27 PM  Result Value Ref Range   WBC Count 6.5 4.0 - 10.5 K/uL   RBC 3.86 (L) 3.87 - 5.11 MIL/uL   Hemoglobin 11.1 (L) 12.0 - 15.0 g/dL   HCT 66.2 (L) 63.9 - 53.9 %   MCV 87.3 80.0 - 100.0 fL   MCH 28.8 26.0 - 34.0 pg   MCHC 32.9 30.0 - 36.0 g/dL   RDW 87.2 88.4 - 84.4 %   Platelet Count 225 150 - 400 K/uL   nRBC 0.0 0.0 - 0.2 %   Neutrophils Relative % 64 %   Neutro Abs  4.1 1.7 - 7.7 K/uL   Lymphocytes Relative 24 %   Lymphs Abs 1.6 0.7 - 4.0 K/uL   Monocytes Relative 10 %   Monocytes Absolute 0.7 0.1 - 1.0 K/uL   Eosinophils Relative 2 %   Eosinophils Absolute 0.1 0.0 - 0.5 K/uL   Basophils Relative 0 %   Basophils Absolute 0.0 0.0 - 0.1 K/uL   Immature Granulocytes 0 %   Abs Immature Granulocytes 0.01 0.00 - 0.07 K/uL    Comment: Performed at [facility], [address], [place], [place] [address]    Psychiatric Specialty Exam: Physical Exam  Review of  Systems  Weight 207 lb (93.9 kg).There is no height or weight on file to calculate BMI.  General Appearance: NA  Eye Contact:  NA  Speech:  Slow  Volume:  Decreased  Mood:  Euthymic  Affect:  NA  Thought Process:  Descriptions of Associations: Intact  Orientation:  Full (Time, Place, and Person)  Thought Content:  WDL  Suicidal Thoughts:  No  Homicidal Thoughts:  No  Memory:  Immediate;   Fair Recent;   Fair Remote;   Fair  Judgement:  Fair  Insight:  Present  Psychomotor Activity:  Decreased  Concentration:  Concentration: Fair and Attention Span: Fair  Recall:  Fair  Fund of Knowledge:  Good  Language:  Good  Akathisia:  No  Handed:  Right  AIMS (if indicated):     Assets:  Communication Skills Desire for Improvement Housing  ADL's:  Intact  Cognition:  WNL  Sleep:   better    Assessment/Plan: Paranoid schizophrenia (HCC) - Plan: clonazePAM  (KLONOPIN ) 1 MG tablet, traZODone  (DESYREL ) 100 MG tablet, ziprasidone  (GEODON ) 80 MG capsule, PARoxetine  (PAXIL ) 40 MG tablet  Generalized anxiety disorder - Plan: clonazePAM  (KLONOPIN ) 1 MG tablet  Memory deficits - Plan: ziprasidone  (GEODON ) 80 MG capsule  Patient is stable on current medication.  Encouraged to have in person visit and use her SCAT doctors appointments recently approved.  Encourage walking and watching her calorie intake as she had gained few pounds since the last visit.  She does not want to  change the medication since it is helping him keeping her stable.  Her memory issues are not worsening and is stable.  She feels fine since living by herself but her sister and brother-in-law live very close by.  Will continue 80 mg Geodon  at bedtime, Klonopin  1 mg at bedtime, trazodone  100 mg at bedtime and Paxil  40 mg daily.  Discussed medication side effects and benefits.  She does not want to cut down the Klonopin .  Recommend to call back if she has any question or any concern.  Follow-up in 3 months.   Follow Up Instructions:     I discussed the assessment and treatment plan with the patient. The patient was provided an opportunity to ask questions and all were answered. The patient agreed with the plan and demonstrated an understanding of the instructions.   The patient was advised to call back or seek an in-person evaluation if the symptoms worsen or if the condition fails to improve as anticipated.    Collaboration of Care: Other provider involved in patient's care AEB notes are available in epic to review  Patient/Guardian was advised Release of Information must be obtained prior to any record release in order to collaborate their care with an outside provider. Patient/Guardian was advised if they have not already done so to contact the registration department to sign all necessary forms in order for us  to release information regarding their care.   Consent: Patient/Guardian gives verbal consent for treatment and assignment of benefits for services provided during this visit. Patient/Guardian expressed understanding and agreed to proceed.     I provided 22 minutes of non face to face time during this encounter.  Note: This document was prepared by [employer] voice dictation technology and any errors that results from this process are unintentional.    Leni ONEIDA Client, MD 10/06/2023

## 2023-10-08 ENCOUNTER — Other Ambulatory Visit: Payer: Self-pay | Admitting: Adult Health

## 2023-10-14 ENCOUNTER — Ambulatory Visit (INDEPENDENT_AMBULATORY_CARE_PROVIDER_SITE_OTHER): Payer: 59 | Admitting: Adult Health

## 2023-10-14 VITALS — BP 137/69 | HR 74 | Ht 62.0 in | Wt 211.4 lb

## 2023-10-14 DIAGNOSIS — G2581 Restless legs syndrome: Secondary | ICD-10-CM | POA: Diagnosis not present

## 2023-10-14 NOTE — Patient Instructions (Signed)
Your Plan: ? ?Continue requip  ?If your symptoms worsen or you develop new symptoms please let us know.  ? ?Thank you for coming to see us at [facility]. I hope we have been able to provide you high quality care today. ? ?You may receive a patient satisfaction survey over the next few weeks. We would appreciate your feedback and comments so that we may continue to improve ourselves and the health of our patients. ? ?

## 2023-10-14 NOTE — Progress Notes (Signed)
 PATIENT: Chelsea [name] DOB: 1950-04-22  REASON FOR VISIT: follow up HISTORY FROM: patient  Chief Complaint  Patient presents with   Follow-up    1 year follow up she stated that she isn't using c-pap, she stated that will help with sleep and causes issues with breathing stopped using about 1 yr ago. EES total 3      HISTORY OF PRESENT ILLNESS: Today 10/14/23:  Chelsea [name] is a 73 y.o. female with a history of restless leg syndrome. Returns today for follow-up.  She returns today for follow-up.  She states that Requip  0.25 mg 3 tabs at bedtime tends to work well for her.  She denies any symptoms of restless legs.  States that she sleeps relatively well.  Has a follow-up this Friday with Dr. Shelah for BiPAP.  She has not had any additional seizure-like events.  Overall she is doing relatively well.  Denies any nocturnal headaches.   10/14/22: Chelsea [name] is a 73 y.o. female with a history of RLS, possible seizure event. Returns today for follow-up. Continue on requip  0.25 mg- 3 tablets at bedtime. This works well.  Denies any additional seizure-like events.  Reports that she has been getting headaches that wake her out of her sleep the last couple of months.  Reports that it started off 2-3 times a week now its only once a week.. Thought it was due to high BP but BP is better since they increased the medicine. Headache occur in the temporal regions. Denies light or noise sensitivty. No nausea and vomiting. No other symptoms- numbess, tingling, wekaness. No changes with vision. Takes ibuprofen and headache will resolve.  She does follow with pulmonology.  She is currently on BiPAP therapy.  States that she is compliant with her BiPAP machine.  In the past she has stated that she was using supplemental oxygen  at night she reports that she is no longer using this?  I reviewed Dr. Lanny note from March 2024.  There was many nights the patient did not use BiPAP more than 4  hours.   03/18/2022 [facility]): She reports feeling better, she feels back to baseline.  Sister reports that she has been in her usual state of health, her psychiatrist was not in favor of adding a second dose of clonazepam  in the morning and they have just been utilizing the evening dose of clonazepam  at the usual dose.  They have stopped the additional 0.5 mg of clonazepam  in the morning.  She has not had any recurrent seizure events or seizure-like changes, no one-sided weakness or numbness or tingling or droopy face or slurring of speech.  Event at the end of November was overnight, patient was in her room sleeping in the recliner and they heard a noise, she was on the floor, she had toppled the chair.  She was confused, her speech was difficult to understand, sister called EMS.     She was recently hospitalized from 02/27/2022 through 03/01/2022 for concern for seizure.  The patient presented to the emergency room with confusion and a fall.  I reviewed the hospital records.  Blood work showed normal TSH, RPR reactive.  Methylmalonic acid 155, in the normal range, vitamin B12 elevated at 1901, ammonia level less than 10.  She had an EEG on 02/28/2022 which showed: IMPRESSION: This study is suggestive of cortical dysfunction arising from left hemisphere, maximal left temporal region likely secondary to underlying structural abnormality, post-ictal state. No seizures or epileptiform discharges  were seen throughout the recording.  She was placed on long-term EEG monitoring.  Evaluation on 03/01/2022 showed:  IMPRESSION: This study is suggestive of non-specific cortical dysfunction arising from left temporal region. No seizures or epileptiform discharges were seen throughout the recording.   EEG appears to be improving compared to previous day.    She had a brain MRI without contrast on 02/28/2022 and I reviewed the results: Impression: No acute intracranial abnormality.   She had a head CT without contrast  and cervical spine CT without contrast on 02/28/2022 and I reviewed the results: Impression: No acute intracranial abnormality.  No acute fracture or static subluxation of the cervical spine.   Patient has a history of post measles encephalitis and seizure is a 71-year-old.  She has not had any seizures since childhood, she was on Dilantin in the past which caused major side effects including her losing all her teeth as I understand. Patient does not hydrate very well, she does not like to drink water, she drinks tea, she likes to add artificial sweetener. She uses a BiPAP at night. She does not take any Wellbutrin.  12/04/20: Chelsea [name] is a 73 year old female with a history of restless leg syndrome.  She returns today for follow-up.  She states that Requip  0.75 mg at bedtime has been working well for her.  She is here today with her sister.  She reports that the patient is fatigued throughout the day.  She states that she sleeps during the day a lot.  Patient previously had a split-night study but sleep efficacy was impaired so therefore the study was limited.  However the patient BMI has decreased since that study.  Sleep apnea is not likely however the patient sister would like to repeat the test if possible.  11/10/19: Chelsea [name] is a 73 year old female with a history of restless leg syndrome.  She returns today for follow-up.  Overall she has been doing well.  Reports that she continues to take Requip  0.75 milligrams at bedtime.  She states that this is working well for her.  She denies any trouble sleeping.  She returns today for an evaluation.  HISTORY 11/09/18:   Chelsea [name] is a 73 year old female with a history of restless leg syndrome, nocturnal hypoxemia and daytime sleepiness.  She returns today for follow-up.  She states that her restless legs are controlled with Requip .  She continues to take Requip  .75 mg at bedtime.  She reports that she still does not sleep very well at night.  She states  that she does sleep a lot during the day.  She typically takes her nighttime meds around 1015.  This includes Requip , Klonopin , Geodon  and trazodone .  She states that she typically stays up watching television then will try to go to bed until 1:30 AM.  She also reports that she drinks caffeine throughout the day.  She usually sleeps with the television on.  She continues to use supplemental O2 at bedtime.  She returns today for follow-up.   REVIEW OF SYSTEMS: Out of a complete 14 system review of symptoms, the patient complains only of the following symptoms, and all other reviewed systems are negative.  ESS 10 FSS 47  ALLERGIES: Allergies  Allergen Reactions   Darvocet [Propoxyphene N-Acetaminophen ] Anaphylaxis    Can take plain tylenol    Fluoxetine Other (See Comments)    Hallucinations    Penicillins Rash   Promethazine Hives    HOME MEDICATIONS: Outpatient Medications Prior to Visit  Medication  Sig Dispense Refill   acetaminophen  (TYLENOL ) 325 MG tablet Take 650 mg by mouth every 6 (six) hours as needed for moderate pain.     albuterol  (PROVENTIL ) (2.5 MG/3ML) 0.083% nebulizer solution Take 3 mLs (2.5 mg total) by nebulization every 6 (six) hours as needed for wheezing or shortness of breath. 75 mL 12   amLODipine  (NORVASC ) 10 MG tablet Take 10 mg by mouth daily.     bumetanide  (BUMEX ) 1 MG tablet Take 1 tablet (1 mg total) by mouth daily. 30 tablet 0   Cholecalciferol  (VITAMIN D3) 5000 units TABS Take 5,000 Units by mouth daily.     clonazePAM  (KLONOPIN ) 1 MG tablet Take 1 tablet (1 mg total) by mouth at bedtime. 90 tablet 0   diclofenac sodium (VOLTAREN) 1 % GEL Apply 2 g topically 4 (four) times daily as needed (pain).     Ensure Max Protein (ENSURE MAX PROTEIN) LIQD Take 330 mLs (11 oz total) by mouth 2 (two) times daily. 3300 mL 0   ferrous gluconate  (FERGON) 324 MG tablet TAKE 1 TABLET BY MOUTH TWICE DAILY WITH MEALS 60 tablet 11   fluticasone  (FLONASE ) 50 MCG/ACT nasal spray  Place 2 sprays into both nostrils daily. As needed     fluticasone  furoate-vilanterol (BREO ELLIPTA ) 200-25 MCG/INH AEPB Inhale 1 puff into the lungs every morning.     insulin  aspart (NOVOLOG  FLEXPEN) 100 UNIT/ML FlexPen Inject 2-12 Units into the skin 3 (three) times daily with meals. Sliding scale     LANTUS  100 UNIT/ML injection 35 Units at bedtime.     LEVEMIR  100 UNIT/ML injection Inject 30 Units into the skin at bedtime.     levothyroxine  (SYNTHROID ) 100 MCG tablet Take 1 tablet (100 mcg total) by mouth daily before breakfast. 30 tablet 3   metFORMIN (GLUCOPHAGE) 500 MG tablet Take 500 mg by mouth in the morning and at bedtime.     metoprolol  (TOPROL -XL) 200 MG 24 hr tablet Take 200 mg by mouth daily.     montelukast  (SINGULAIR ) 10 MG tablet Take 10 mg by mouth daily.     Multiple Vitamin (MULTIVITAMIN WITH MINERALS) TABS tablet Take 1 tablet by mouth daily. 30 tablet 0   nystatin  (MYCOSTATIN /NYSTOP ) powder Apply 1 Application topically 2 (two) times daily. Apply to rash under breast and groin rash. Follow up with PCP for further refills 15 g 0   omeprazole (PRILOSEC) 40 MG capsule Take 40 mg by mouth daily before breakfast.     ondansetron  (ZOFRAN ) 4 MG tablet Take 1 tablet (4 mg total) by mouth every 6 (six) hours as needed for nausea. 20 tablet 0   OZEMPIC, 2 MG/DOSE, 8 MG/3ML SOPN Inject 2 mg into the skin every Friday.     PARoxetine  (PAXIL ) 40 MG tablet Take 1 tablet (40 mg total) by mouth every morning. 90 tablet 0   polyethylene glycol (MIRALAX  / GLYCOLAX ) 17 g packet Take 17 g by mouth daily as needed for mild constipation. 14 each 0   rOPINIRole  (REQUIP ) 0.25 MG tablet TAKE 3 TABLETS BY MOUTH AT BEDTIME 90 tablet 11   rosuvastatin  (CRESTOR ) 10 MG tablet Take 10 mg by mouth at bedtime.     traZODone  (DESYREL ) 100 MG tablet Take 1 tablet (100 mg total) by mouth at bedtime. 90 tablet 0   TRUEPLUS INSULIN  SYRINGE 29G X 1/2 1 ML MISC 1 each by Other route daily.     VENTOLIN  HFA 108  (90 Base) MCG/ACT inhaler Inhale 1-2 puffs into the lungs  every 4 (four) hours as needed for wheezing or shortness of breath.     ziprasidone  (GEODON ) 80 MG capsule Take 1 capsule (80 mg total) by mouth at bedtime. 90 capsule 0   No facility-administered medications prior to visit.    PAST MEDICAL HISTORY: Past Medical History:  Diagnosis Date   Anxiety    Arthritis    Bipolar disorder (HCC)    COPD (chronic obstructive pulmonary disease) (HCC)    Diabetes mellitus type II    Esophageal stricture    moderate distal   GERD (gastroesophageal reflux disease)    Heart murmur    HAD AN ECHO YEARS AGO- NOTHING TO BE CONCERNED ABOUT   Hyperlipemia    Hypertension    Hypothyroidism    Mental disorder    Bipolar   Obesity    Oxygen  desaturation during sleep    wears 2 liters at bedtime    Schizophrenia (HCC)    patient reports that she is unaware of this   Shortness of breath    with a little activy   Sleep apnea    wears 2 liters of oxygen  at bedtime instead of CPAP    PAST SURGICAL HISTORY: Past Surgical History:  Procedure Laterality Date   BALLOON DILATION N/A 06/10/2016   Procedure: BALLOON DILATION;  Surgeon: Jerrell Sol, MD;  Location: [facility] ENDOSCOPY;  Service: Endoscopy;  Laterality: N/A;   BREAST EXCISIONAL BIOPSY Left 05/10/2012   benign   CHOLECYSTECTOMY     COLONOSCOPY WITH PROPOFOL  N/A 01/30/2017   Procedure: COLONOSCOPY WITH PROPOFOL ;  Surgeon: Sol Jerrell, MD;  Location: [facility] ENDOSCOPY;  Service: Endoscopy;  Laterality: N/A;   COLONOSCOPY WITH PROPOFOL  N/A 09/29/2020   Procedure: COLONOSCOPY WITH PROPOFOL ;  Surgeon: Rosalie Kitchens, MD;  Location: [facility] ENDOSCOPY;  Service: Endoscopy;  Laterality: N/A;   ESOPHAGEAL MANOMETRY N/A 12/19/2020   Procedure: ESOPHAGEAL MANOMETRY (EM);  Surgeon: Sol Jerrell, MD;  Location: [facility] ENDOSCOPY;  Service: Endoscopy;  Laterality: N/A;   ESOPHAGOGASTRODUODENOSCOPY N/A 06/03/2016   Procedure: ESOPHAGOGASTRODUODENOSCOPY (EGD);   Surgeon: Jerrell Sol, MD;  Location: [facility] ENDOSCOPY;  Service: Endoscopy;  Laterality: N/A;   ESOPHAGOGASTRODUODENOSCOPY N/A 06/10/2016   Procedure: ESOPHAGOGASTRODUODENOSCOPY (EGD);  Surgeon: Jerrell Sol, MD;  Location: [facility] ENDOSCOPY;  Service: Endoscopy;  Laterality: N/A;   ESOPHAGOGASTRODUODENOSCOPY (EGD) WITH PROPOFOL  N/A 09/28/2020   Procedure: ESOPHAGOGASTRODUODENOSCOPY (EGD) WITH PROPOFOL ;  Surgeon: Burnette Fallow, MD;  Location: [facility] ENDOSCOPY;  Service: Endoscopy;  Laterality: N/A;   EYE SURGERY     PARTIAL MASTECTOMY WITH NEEDLE LOCALIZATION Left 05/10/2012   Procedure: PARTIAL MASTECTOMY WITH NEEDLE LOCALIZATION;  Surgeon: Elon CHRISTELLA Pacini, MD;  Location: [facility] OR;  Service: General;  Laterality: Left;   POLYPECTOMY  09/29/2020   Procedure: POLYPECTOMY;  Surgeon: Rosalie Kitchens, MD;  Location: [facility] ENDOSCOPY;  Service: Endoscopy;;   TONSILLECTOMY      FAMILY HISTORY: Family History  Problem Relation Age of Onset   Alzheimer's disease Mother    Alcohol  abuse Father    Stroke Father    Sleep apnea Sister    Restless legs syndrome Neg Hx    Seizures Neg Hx     SOCIAL HISTORY: Social History   Socioeconomic History   Marital status: Divorced    Spouse name: Not on file   Number of children: 0   Years of education: HS   Highest education level: Not on file  Occupational History   Occupation: Retired   Tobacco Use   Smoking status: Former    Current packs/day: 0.00  Average packs/day: 3.0 packs/day for 29.0 years (87.0 ttl pk-yrs)    Types: Cigarettes    Start date: 05/03/1964    Quit date: 05/03/1993    Years since quitting: 30.4   Smokeless tobacco: Never  Vaping Use   Vaping status: Never Used  Substance and Sexual Activity   Alcohol  use: No    Alcohol /week: 0.0 standard drinks of alcohol    Drug use: No   Sexual activity: Not Currently  Other Topics Concern   Not on file  Social History Narrative   Caffeine: rare    Social Drivers of Corporate investment banker  Strain: Not on file  Food Insecurity: Not on file  Transportation Needs: Not on file  Physical Activity: Not on file  Stress: Not on file  Social Connections: Not on file  Intimate Partner Violence: Not on file      PHYSICAL EXAM  Vitals:   10/14/23 1004  BP: 137/69  Pulse: 74  Weight: 211 lb 6.4 oz (95.9 kg)  Height: 5' 2 (1.575 m)    Body mass index is 38.67 kg/m.  Generalized: Well developed, in no acute distress   Neurological examination  Mentation: Alert oriented to time, place, history taking. Follows all commands speech and language fluent Cranial nerve II-XII:  Extraocular movements were full, visual field were full on confrontational test.  Motor: The motor testing reveals 5 over 5 strength of all 4 extremities. Good symmetric motor tone is noted throughout.  Sensory: Sensory testing is intact to soft touch on all 4 extremities. No evidence of extinction is noted.  Coordination: Cerebellar testing reveals good finger-nose-finger and heel-to-shin bilaterally.  Gait and station: Gait is normal.     DIAGNOSTIC DATA (LABS, IMAGING, TESTING) - I reviewed patient records, labs, notes, testing and imaging myself where available.  Lab Results  Component Value Date   WBC 6.5 08/10/2023   HGB 11.1 (L) 08/10/2023   HCT 33.7 (L) 08/10/2023   MCV 87.3 08/10/2023   PLT 225 08/10/2023      Component Value Date/Time   NA 139 12/29/2022 0948   K 4.7 12/29/2022 0948   CL 106 12/29/2022 0948   CO2 26 12/29/2022 0948   GLUCOSE 153 (H) 12/29/2022 0948   BUN 34 (H) 12/29/2022 0948   CREATININE 1.31 (H) 12/29/2022 0948   CALCIUM  9.2 12/29/2022 0948   PROT 5.3 (L) 02/28/2022 0004   ALBUMIN  2.6 (L) 02/28/2022 0004   AST 29 02/28/2022 0004   ALT 20 02/28/2022 0004   ALKPHOS 84 02/28/2022 0004   BILITOT 0.6 02/28/2022 0004   GFRNONAA 44 (L) 12/29/2022 0948   GFRAA 38 (L) 06/02/2016 2343   Lab Results  Component Value Date   HGBA1C 7.5 (H) 02/18/2022   Lab Results   Component Value Date   VITAMINB12 1,901 (H) 02/28/2022   Lab Results  Component Value Date   TSH 0.216 (L) 02/28/2022      ASSESSMENT AND PLAN 73 y.o. year old female  has a past medical history of Anxiety, Arthritis, Bipolar disorder (HCC), COPD (chronic obstructive pulmonary disease) (HCC), Diabetes mellitus type II, Esophageal stricture, GERD (gastroesophageal reflux disease), Heart murmur, Hyperlipemia, Hypertension, Hypothyroidism, Mental disorder, Obesity, Oxygen  desaturation during sleep, Schizophrenia (HCC), Shortness of breath, and Sleep apnea. here with:  1.  Restless leg syndrome  Continue Requip  0.75 mg at bedtime Advised if symptoms worsen or she develops new symptoms she should let us  know  2.  Seizure like episode  No additional events continue  to monitor  Follow-up in 1 year or sooner if needed    Duwaine Russell, MSN, NP-C 10/14/2023, 10:09 AM [facility] [address] [place], [place] [address] [phone]  The patient's condition requires frequent monitoring and adjustments in the treatment plan, reflecting the ongoing complexity of care.  This provider is the continuing focal point for all needed services for this condition.

## 2023-10-16 ENCOUNTER — Encounter: Payer: Self-pay | Admitting: Emergency Medicine

## 2023-10-16 ENCOUNTER — Ambulatory Visit (INDEPENDENT_AMBULATORY_CARE_PROVIDER_SITE_OTHER): Admitting: Emergency Medicine

## 2023-10-16 VITALS — BP 131/74 | HR 67 | Temp 97.8°F | Ht 62.0 in | Wt 212.2 lb

## 2023-10-16 DIAGNOSIS — Z87891 Personal history of nicotine dependence: Secondary | ICD-10-CM | POA: Diagnosis not present

## 2023-10-16 DIAGNOSIS — G4733 Obstructive sleep apnea (adult) (pediatric): Secondary | ICD-10-CM | POA: Diagnosis not present

## 2023-10-16 DIAGNOSIS — J441 Chronic obstructive pulmonary disease with (acute) exacerbation: Secondary | ICD-10-CM

## 2023-10-16 NOTE — Progress Notes (Signed)
   Subjective:    Patient ID: Chelsea [name], female    DOB: [birth year], 73 y.o.   MRN: [id number]  HPI  ROV 10/16/2023 --follow-up visit 73 year old woman with a history of COPD, obstructive sleep apnea with chronic hypoxemic respiratory failure.  Also with hypertension and diastolic CHF.  She had been managed on AutoSet BiPAP (max IPAP 25, max EPAP 12, PS 4).  She uses Breo, albuterol  very rarely - never needs it She reports that she is able to be active, she is able to shop, tends to her dogs. She can sometimes have some dyspnea when she is anxious or rushed. She stopped using her BiPAP several months ago, was smothering her and waking her up. She doesn't want to try to titrate it or restart at this time. Denies daytime sleepiness. She has O2 but does not use it.    Review of Systems As per HPI      Objective:   Physical Exam Vitals:   10/16/23 1113  BP: 131/74  Pulse: 67  Temp: 97.8 F (36.6 C)  TempSrc: Temporal  SpO2: 96%  Weight: 212 lb 3.2 oz (96.3 kg)  Height: 5' 2 (1.575 m)    Gen: Pleasant, overweight woman, in no distress, somewhat quiet affect  ENT: No lesions,  mouth clear,  oropharynx clear, no postnasal drip, edentulous  Neck: No JVD, no stridor  Lungs: No use of accessory muscles, distant but no crackles or wheezing on normal respiration, no wheeze on forced expiration  Cardiovascular: RRR, heart sounds normal, no murmur or gallops, no peripheral edema  Musculoskeletal: No deformities, no cyanosis or clubbing  Neuro: alert, awake, non focal  Skin: Warm, no lesions or rash     Assessment & Plan:  COPD (chronic obstructive pulmonary disease) (HCC) Please continue Breo once daily.  Rinse and gargle after using. Keep your albuterol  available to use 2 puffs when needed for shortness of breath, chest tightness, wheezing.  OSA (obstructive sleep apnea) We discussed your BiPAP machine and mask.  You would probably benefit from getting back on this at some  point given the severity of your sleep apnea.  We should revisit this at your follow-up.  If you have any changes in your sleep, nighttime breathing or daytime breathing then we should strongly consider retrying the BiPAP. Please follow in our office in 6 months.  Call sooner if you have any problems   Lamar Chris, MD, PhD 10/16/2023, 11:36 AM [facility] [phone] or if no answer before 7:00PM call [phone] For any issues after 7:00PM please call eLink [phone]

## 2023-10-16 NOTE — Assessment & Plan Note (Signed)
 We discussed your BiPAP machine and mask.  You would probably benefit from getting back on this at some point given the severity of your sleep apnea.  We should revisit this at your follow-up.  If you have any changes in your sleep, nighttime breathing or daytime breathing then we should strongly consider retrying the BiPAP. Please follow in our office in 6 months.  Call sooner if you have any problems

## 2023-10-16 NOTE — Assessment & Plan Note (Signed)
 Please continue Breo once daily.  Rinse and gargle after using. Keep your albuterol  available to use 2 puffs when needed for shortness of breath, chest tightness, wheezing.

## 2023-10-16 NOTE — Patient Instructions (Signed)
 Please continue Breo once daily.  Rinse and gargle after using. Keep your albuterol  available to use 2 puffs when needed for shortness of breath, chest tightness, wheezing. We discussed your BiPAP machine and mask.  You would probably benefit from getting back on this at some point given the severity of your sleep apnea.  We should revisit this at your follow-up.  If you have any changes in your sleep, nighttime breathing or daytime breathing then we should strongly consider retrying the BiPAP. Please follow in our office in 6 months.  Call sooner if you have any problems

## 2023-11-24 ENCOUNTER — Other Ambulatory Visit (HOSPITAL_COMMUNITY): Payer: Self-pay | Admitting: Psychiatry

## 2023-11-24 DIAGNOSIS — F411 Generalized anxiety disorder: Secondary | ICD-10-CM

## 2023-11-24 DIAGNOSIS — F200 Paranoid schizophrenia: Secondary | ICD-10-CM

## 2023-11-24 DIAGNOSIS — R413 Other amnesia: Secondary | ICD-10-CM

## 2023-12-11 ENCOUNTER — Inpatient Hospital Stay (HOSPITAL_BASED_OUTPATIENT_CLINIC_OR_DEPARTMENT_OTHER): Admitting: Nurse Practitioner

## 2023-12-11 ENCOUNTER — Inpatient Hospital Stay: Attending: Oncology

## 2023-12-11 ENCOUNTER — Encounter: Payer: Self-pay | Admitting: Nurse Practitioner

## 2023-12-11 VITALS — BP 115/55 | HR 70 | Temp 97.9°F | Resp 18 | Ht 62.0 in | Wt 211.0 lb

## 2023-12-11 DIAGNOSIS — D509 Iron deficiency anemia, unspecified: Secondary | ICD-10-CM

## 2023-12-11 LAB — CBC WITH DIFFERENTIAL (CANCER CENTER ONLY)
Abs Immature Granulocytes: 0.01 K/uL (ref 0.00–0.07)
Basophils Absolute: 0 K/uL (ref 0.0–0.1)
Basophils Relative: 0 %
Eosinophils Absolute: 0.1 K/uL (ref 0.0–0.5)
Eosinophils Relative: 2 %
HCT: 33.4 % — ABNORMAL LOW (ref 36.0–46.0)
Hemoglobin: 11.1 g/dL — ABNORMAL LOW (ref 12.0–15.0)
Immature Granulocytes: 0 %
Lymphocytes Relative: 18 %
Lymphs Abs: 1.3 K/uL (ref 0.7–4.0)
MCH: 29.1 pg (ref 26.0–34.0)
MCHC: 33.2 g/dL (ref 30.0–36.0)
MCV: 87.4 fL (ref 80.0–100.0)
Monocytes Absolute: 0.6 K/uL (ref 0.1–1.0)
Monocytes Relative: 9 %
Neutro Abs: 5.1 K/uL (ref 1.7–7.7)
Neutrophils Relative %: 71 %
Platelet Count: 233 K/uL (ref 150–400)
RBC: 3.82 MIL/uL — ABNORMAL LOW (ref 3.87–5.11)
RDW: 12.7 % (ref 11.5–15.5)
WBC Count: 7.2 K/uL (ref 4.0–10.5)
nRBC: 0 % (ref 0.0–0.2)

## 2023-12-11 LAB — FERRITIN: Ferritin: 75 ng/mL (ref 11–307)

## 2023-12-11 NOTE — Progress Notes (Signed)
  [facility] OFFICE PROGRESS NOTE   Diagnosis: Iron deficiency anemia  INTERVAL HISTORY:   Ms. Henckel returns as scheduled.  She continues oral iron.  No cough depression or nausea.  She denies bleeding.  No black stools.  Energy and appetite are good.  Objective:  Vital signs in last 24 hours:  Blood pressure (!) 115/55, pulse 70, temperature 97.9 F (36.6 C), temperature source Temporal, resp. rate 18, height 5' 2 (1.575 m), weight 211 lb (95.7 kg), SpO2 96%.    Resp: Lungs clear bilaterally. Cardio: Regular rate and rhythm. GI: No hepatosplenomegaly.  Nontender. Vascular: No leg edema.   Lab Results:  Lab Results  Component Value Date   WBC 7.2 12/11/2023   HGB 11.1 (L) 12/11/2023   HCT 33.4 (L) 12/11/2023   MCV 87.4 12/11/2023   PLT 233 12/11/2023   NEUTROABS 5.1 12/11/2023    Imaging:  No results found.  Medications: I have reviewed the patient's current medications.  Assessment/Plan: Iron deficiency anemia Admission with GI bleed July 2022, December 2022 09/28/2020 EGD-esophageal plaques consistent with candidiasis, gastritis 09/29/2020 colonoscopy-internal hemorrhoids, 2 small polyps in the mid transverse colon and distal ascending colon 06/05/2022 capsule endoscopy-normal Chronic kidney disease COPD Sleep apnea Depression Schizophrenia Diabetes Hypertension Hypothyroid Elevated cholesterol Osteoarthritis  Disposition: Ms. Lebo appears stable.  She has a history of iron deficiency anemia.  CBC from today shows stable mild anemia.  We will follow-up on the ferritin.  She will continue oral iron.  Plan for lab and follow-up in 4 months.  She will contact the office in the interim with any problems.    Olam Ned ANP/GNP-BC   12/11/2023  9:53 AM

## 2023-12-17 ENCOUNTER — Telehealth (HOSPITAL_COMMUNITY): Payer: Self-pay | Admitting: *Deleted

## 2023-12-17 NOTE — Telephone Encounter (Signed)
 Writer spoke to Chelsea [name] at [facility] as they had called requesting refills for the Paxil , Klonopin , and Geodon . 90 day scripts were sent to pharmacy on 10/06/23. Pt has next visit scheduled for 01/07/24. Writer advised that they would be refilled on next appointment.  As pharmacy provides dose packs for pt they are requesting fill today. Please review and advise.

## 2023-12-17 NOTE — Telephone Encounter (Signed)
 Pharmacy advised

## 2023-12-17 NOTE — Telephone Encounter (Signed)
 Prescription will be filled at her appointment.

## 2023-12-21 ENCOUNTER — Ambulatory Visit (INDEPENDENT_AMBULATORY_CARE_PROVIDER_SITE_OTHER): Admitting: Podiatry

## 2023-12-21 ENCOUNTER — Encounter: Payer: Self-pay | Admitting: Podiatry

## 2023-12-21 DIAGNOSIS — E119 Type 2 diabetes mellitus without complications: Secondary | ICD-10-CM | POA: Diagnosis not present

## 2023-12-21 DIAGNOSIS — B351 Tinea unguium: Secondary | ICD-10-CM | POA: Diagnosis not present

## 2023-12-21 DIAGNOSIS — M79674 Pain in right toe(s): Secondary | ICD-10-CM

## 2023-12-21 DIAGNOSIS — M79675 Pain in left toe(s): Secondary | ICD-10-CM | POA: Diagnosis not present

## 2023-12-21 NOTE — Progress Notes (Signed)
This patient returns to my office for at risk foot care.  This patient requires this care by a professional since this patient will be at risk due to having diabetes.  This patient is unable to cut nails herself since the patient cannot reach her nails.These nails are painful walking and wearing shoes.  This patient presents for at risk foot care today.  General Appearance  Alert, conversant and in no acute stress.  Vascular  Dorsalis pedis  are palpable  Bilaterally. Posterior tibial pulses are not palpable  B/L.  Capillary return is within normal limits  Bilaterally.  Cold feet.  Bilaterally. Absent hair.  Neurologic  Senn-Weinstein monofilament wire test within normal limits  bilaterally. Muscle power within normal limits bilaterally.  Nails Thick disfigured discolored nails with subungual debris  Hallux nails bilaterally. No evidence of bacterial infection or drainage bilaterally.  Orthopedic  No limitations of motion  feet .  No crepitus or effusions noted.  No bony pathology or digital deformities noted.  Skin  normotropic skin with no porokeratosis noted bilaterally.  No signs of infections or ulcers noted.     Onychomycosis  Pain in right toe  Pain in left toe.  Consent was obtained for treatment procedures.  Debridement and grinding of long thick nails with clearing of subungual debris.  No infection or ulcer.     Return office visit   4 months.       Told patient to return for periodic foot care and evaluation due to potential at risk complications.   Helane Gunther DPM

## 2023-12-24 ENCOUNTER — Telehealth (HOSPITAL_COMMUNITY): Payer: Self-pay | Admitting: *Deleted

## 2023-12-24 NOTE — Telephone Encounter (Signed)
 Pt called requesting a refill of the Klonopin  1 mg 1 tab at bedtime. #90 script was e-scribed on 10/06/23. Pt says she doesn't have any more. Pt denied taking any more than 1 tab per night.I spoke with [facility] who initially denied any script was sent in on 10/06/23. Informed pharmacy receipt confirmed in EPIC on 10/06/23 0928. They said that there was a prescription for #11 tabs that pt picked up on 12/22/23? Pt has an appointment on 01/07/24. That will take her to upcoming appointment. FYI.

## 2024-01-07 ENCOUNTER — Encounter (HOSPITAL_COMMUNITY): Payer: Self-pay | Admitting: Psychiatry

## 2024-01-07 ENCOUNTER — Ambulatory Visit (HOSPITAL_COMMUNITY): Admitting: Psychiatry

## 2024-01-07 ENCOUNTER — Other Ambulatory Visit: Payer: Self-pay

## 2024-01-07 VITALS — BP 139/84 | HR 73 | Ht 62.0 in | Wt 212.0 lb

## 2024-01-07 DIAGNOSIS — F200 Paranoid schizophrenia: Secondary | ICD-10-CM

## 2024-01-07 DIAGNOSIS — F411 Generalized anxiety disorder: Secondary | ICD-10-CM

## 2024-01-07 DIAGNOSIS — R413 Other amnesia: Secondary | ICD-10-CM | POA: Diagnosis not present

## 2024-01-07 MED ORDER — ZIPRASIDONE HCL 80 MG PO CAPS: 80.0000 mg | ORAL_CAPSULE | Freq: Every day | ORAL | 0 refills | Status: DC

## 2024-01-07 MED ORDER — CLONAZEPAM 1 MG PO TABS: 1.0000 mg | ORAL_TABLET | Freq: Every day | ORAL | 0 refills | Status: DC

## 2024-01-07 MED ORDER — TRAZODONE HCL 100 MG PO TABS: 100.0000 mg | ORAL_TABLET | Freq: Every day | ORAL | 0 refills | Status: DC

## 2024-01-07 MED ORDER — PAROXETINE HCL 40 MG PO TABS: 40.0000 mg | ORAL_TABLET | Freq: Every morning | ORAL | 0 refills | Status: DC

## 2024-01-07 NOTE — Progress Notes (Signed)
 [facility] MD/PA/NP OP Progress Note  01/07/2024 1:22 PM Chelsea [name]  MRN:  [id number]  Chief Complaint:  Chief Complaint  Patient presents with   Follow-up   Medication Refill   HPI: Patient came today to the office for her follow-up appointment.  This is a in person visit after a while.  She cannot do video session as not familiar with technology and camera.  She reported things are okay with the medication.  She does not have transportation and she uses public transport and [facility] services for doctors appointment.  She mention brother-in-law is not doing very well as diagnosed with Alzheimer.  Her sister very busy taking care of brother-in-law.  She tried to see the sister at least once a week.  She lives with 2 dogs and a cat.  Recently had a visit with pulmonologist and neurologist.  She is not using any BiPAP or CPAP for insomnia even though recommended by her specialist.  She feels sleeping with machine causing more nervousness and she could not sleep very well.  She is also seeing podiatrist for toenail.  She reported appetite is okay and weight is stable.  She denies any mania, psychosis, hallucination or any paranoia.  She is taking Paxil , Geodon , Klonopin  and trazodone .  She is very reluctant to cut down the dose even though she is pretty stable on these medication for a while and may reduce the dose if needed.  [facility] care is [facility] and they does home visits.  Recently had a blood work in August.  Total cholesterol 150, triglyceride 131, creatinine 1.08 and BUN 20.  Her blood sugar was 145.  Her hemoglobin A1c is 6.1.  She has a sensor that helps monitoring her blood sugar.  She has anemia and she takes iron.  She uses the pharmacy which delivers the medication at her place.  She has mild tremors but does not interfere in her daily activity.  She denies any major panic attack, crying spells or any feeling of hopelessness or worthlessness.  She does go outside to the mailbox but standing  for a long time or walk make her tired.  She denies drinking or using any illegal substances.  She has help who comes every few weeks to clean the house.  Visit Diagnosis:    ICD-10-CM   1. Paranoid schizophrenia (HCC)  F20.0 clonazePAM  (KLONOPIN ) 1 MG tablet    traZODone  (DESYREL ) 100 MG tablet    ziprasidone  (GEODON ) 80 MG capsule    PARoxetine  (PAXIL ) 40 MG tablet    2. Generalized anxiety disorder  F41.1 clonazePAM  (KLONOPIN ) 1 MG tablet    3. Memory deficits  R41.3 ziprasidone  (GEODON ) 80 MG capsule      Past Psychiatric History: Reviewed H/O inpatient at [facility] and [facility] for mania and psychosis.  No h/o suicidal attempt.    Past Medical History:  Past Medical History:  Diagnosis Date   Anxiety    Arthritis    Bipolar disorder (HCC)    COPD (chronic obstructive pulmonary disease) (HCC)    Diabetes mellitus type II    Esophageal stricture    moderate distal   GERD (gastroesophageal reflux disease)    Heart murmur    HAD AN ECHO YEARS AGO- NOTHING TO BE CONCERNED ABOUT   Hyperlipemia    Hypertension    Hypothyroidism    Mental disorder    Bipolar   Obesity    Oxygen  desaturation during sleep    wears 2 liters  at bedtime    Schizophrenia [facility])    patient reports that she is unaware of this   Shortness of breath    with a little activy   Sleep apnea    wears 2 liters of oxygen  at bedtime instead of CPAP    Past Surgical History:  Procedure Laterality Date   BALLOON DILATION N/A 06/10/2016   Procedure: BALLOON DILATION;  Surgeon: Jerrell Sol, MD;  Location: [facility] ENDOSCOPY;  Service: Endoscopy;  Laterality: N/A;   BREAST EXCISIONAL BIOPSY Left 05/10/2012   benign   CHOLECYSTECTOMY     COLONOSCOPY WITH PROPOFOL  N/A 01/30/2017   Procedure: COLONOSCOPY WITH PROPOFOL ;  Surgeon: Sol Jerrell, MD;  Location: [facility] ENDOSCOPY;  Service: Endoscopy;  Laterality: N/A;   COLONOSCOPY WITH PROPOFOL  N/A 09/29/2020   Procedure: COLONOSCOPY WITH PROPOFOL ;   Surgeon: Rosalie Kitchens, MD;  Location: [facility] ENDOSCOPY;  Service: Endoscopy;  Laterality: N/A;   ESOPHAGEAL MANOMETRY N/A 12/19/2020   Procedure: ESOPHAGEAL MANOMETRY (EM);  Surgeon: Sol Jerrell, MD;  Location: [facility] ENDOSCOPY;  Service: Endoscopy;  Laterality: N/A;   ESOPHAGOGASTRODUODENOSCOPY N/A 06/03/2016   Procedure: ESOPHAGOGASTRODUODENOSCOPY (EGD);  Surgeon: Jerrell Sol, MD;  Location[facility] ENDOSCOPY;  Service: Endoscopy;  Laterality: N/A;   ESOPHAGOGASTRODUODENOSCOPY N/A 06/10/2016   Procedure: ESOPHAGOGASTRODUODENOSCOPY (EGD);  Surgeon: Jerrell Sol, MD;  Location: [facility] ENDOSCOPY;  Service: Endoscopy;  Laterality: N/A;   ESOPHAGOGASTRODUODENOSCOPY (EGD) WITH PROPOFOL  N/A 09/28/2020   Procedure: ESOPHAGOGASTRODUODENOSCOPY (EGD) WITH PROPOFOL ;  Surgeon: Burnette Fallow, MD;  Location: [facility] ENDOSCOPY;  Service: Endoscopy;  Laterality: N/A;   EYE SURGERY     PARTIAL MASTECTOMY WITH NEEDLE LOCALIZATION Left 05/10/2012   Procedure: PARTIAL MASTECTOMY WITH NEEDLE LOCALIZATION;  Surgeon: Elon CHRISTELLA Pacini, MD;  Location: [facility] OR;  Service: General;  Laterality: Left;   POLYPECTOMY  09/29/2020   Procedure: POLYPECTOMY;  Surgeon: Rosalie Kitchens, MD;  Location: [facility] ENDOSCOPY;  Service: Endoscopy;;   TONSILLECTOMY      Family Psychiatric History: Reviewed  Family History:  Family History  Problem Relation Age of Onset   Alzheimer's disease Mother    Alcohol  abuse Father    Stroke Father    Sleep apnea Sister    Restless legs syndrome Neg Hx    Seizures Neg Hx     Social History:  Social History   Socioeconomic History   Marital status: Divorced    Spouse name: Not on file   Number of children: 0   Years of education: HS   Highest education level: Not on file  Occupational History   Occupation: Retired   Tobacco Use   Smoking status: Former    Current packs/day: 0.00    Average packs/day: 3.0 packs/day for 29.0 years (87.0 ttl pk-yrs)    Types: Cigarettes    Start date: 05/03/1964    Quit  date: 05/03/1993    Years since quitting: 30.7   Smokeless tobacco: Never  Vaping Use   Vaping status: Never Used  Substance and Sexual Activity   Alcohol  use: No    Alcohol /week: 0.0 standard drinks of alcohol    Drug use: No   Sexual activity: Not Currently  Other Topics Concern   Not on file  Social History Narrative   Caffeine: rare    Social Drivers of Corporate investment banker Strain: Not on file  Food Insecurity: Not on file  Transportation Needs: Not on file  Physical Activity: Not on file  Stress: Not on file  Social Connections: Not on file    Allergies:  Allergies  Allergen Reactions   Darvocet [Propoxyphene N-Acetaminophen ] Anaphylaxis    Can take plain tylenol    Fluoxetine Other (See Comments)    Hallucinations    Penicillins Rash   Promethazine Hives    Metabolic Disorder Labs: Lab Results  Component Value Date   HGBA1C 7.5 (H) 02/18/2022   MPG 168.55 02/18/2022   MPG 139.85 03/20/2021   No results found for: PROLACTIN Lab Results  Component Value Date   CHOL 109 03/20/2021   TRIG 61 03/20/2021   HDL 32 (L) 03/20/2021   CHOLHDL 3.4 03/20/2021   VLDL 12 03/20/2021   LDLCALC 65 03/20/2021   Lab Results  Component Value Date   TSH 0.216 (L) 02/28/2022   TSH 1.509 12/03/2012    Therapeutic Level Labs: No results found for: LITHIUM No results found for: VALPROATE No results found for: CBMZ  Current Medications: Current Outpatient Medications  Medication Sig Dispense Refill   acetaminophen  (TYLENOL ) 325 MG tablet Take 650 mg by mouth every 6 (six) hours as needed for moderate pain.     albuterol  (PROVENTIL ) (2.5 MG/3ML) 0.083% nebulizer solution Take 3 mLs (2.5 mg total) by nebulization every 6 (six) hours as needed for wheezing or shortness of breath. 75 mL 12   amLODipine  (NORVASC ) 10 MG tablet Take 10 mg by mouth daily.     bumetanide  (BUMEX ) 1 MG tablet Take 1 tablet (1 mg total) by mouth daily. 30 tablet 0   Cholecalciferol   (VITAMIN D3) 5000 units TABS Take 5,000 Units by mouth daily.     clonazePAM  (KLONOPIN ) 1 MG tablet Take 1 tablet (1 mg total) by mouth at bedtime. 90 tablet 0   diclofenac sodium (VOLTAREN) 1 % GEL Apply 2 g topically 4 (four) times daily as needed (pain).     Ensure Max Protein (ENSURE MAX PROTEIN) LIQD Take 330 mLs (11 oz total) by mouth 2 (two) times daily. 3300 mL 0   ferrous gluconate  (FERGON) 324 MG tablet TAKE 1 TABLET BY MOUTH TWICE DAILY WITH MEALS 60 tablet 11   fluticasone  (FLONASE ) 50 MCG/ACT nasal spray Place 2 sprays into both nostrils daily. As needed     fluticasone  furoate-vilanterol (BREO ELLIPTA ) 200-25 MCG/INH AEPB Inhale 1 puff into the lungs every morning.     insulin  aspart (NOVOLOG  FLEXPEN) 100 UNIT/ML FlexPen Inject 2-12 Units into the skin 3 (three) times daily with meals. Sliding scale     LANTUS  100 UNIT/ML injection 35 Units at bedtime.     LEVEMIR  100 UNIT/ML injection Inject 30 Units into the skin at bedtime.     levothyroxine  (SYNTHROID ) 100 MCG tablet Take 1 tablet (100 mcg total) by mouth daily before breakfast. 30 tablet 3   metFORMIN (GLUCOPHAGE) 500 MG tablet Take 500 mg by mouth in the morning and at bedtime.     metoprolol  (TOPROL -XL) 200 MG 24 hr tablet Take 200 mg by mouth daily.     montelukast  (SINGULAIR ) 10 MG tablet Take 10 mg by mouth daily.     Multiple Vitamin (MULTIVITAMIN WITH MINERALS) TABS tablet Take 1 tablet by mouth daily. 30 tablet 0   nystatin  (MYCOSTATIN /NYSTOP ) powder Apply 1 Application topically 2 (two) times daily. Apply to rash under breast and groin rash. Follow up with PCP for further refills 15 g 0   omeprazole (PRILOSEC) 40 MG capsule Take 40 mg by mouth daily before breakfast.     ondansetron  (ZOFRAN ) 4 MG tablet Take 1 tablet (4 mg total) by mouth every 6 (six) hours as needed for  nausea. 20 tablet 0   OZEMPIC, 2 MG/DOSE, 8 MG/3ML SOPN Inject 2 mg into the skin every Friday.     PARoxetine  (PAXIL ) 40 MG tablet Take 1 tablet (40  mg total) by mouth every morning. 90 tablet 0   polyethylene glycol (MIRALAX  / GLYCOLAX ) 17 g packet Take 17 g by mouth daily as needed for mild constipation. 14 each 0   rOPINIRole  (REQUIP ) 0.25 MG tablet TAKE 3 TABLETS BY MOUTH AT BEDTIME 90 tablet 11   rosuvastatin  (CRESTOR ) 10 MG tablet Take 10 mg by mouth at bedtime.     traZODone  (DESYREL ) 100 MG tablet Take 1 tablet (100 mg total) by mouth at bedtime. 90 tablet 0   TRUEPLUS INSULIN  SYRINGE 29G X 1/2 1 ML MISC 1 each by Other route daily.     VENTOLIN  HFA 108 (90 Base) MCG/ACT inhaler Inhale 1-2 puffs into the lungs every 4 (four) hours as needed for wheezing or shortness of breath.     ziprasidone  (GEODON ) 80 MG capsule Take 1 capsule (80 mg total) by mouth at bedtime. 90 capsule 0   No current facility-administered medications for this visit.     Musculoskeletal: Strength & Muscle Tone: within normal limits Gait & Station: normal Patient leans: N/A  Psychiatric Specialty Exam: Review of Systems  Blood pressure 139/84, pulse 73, height 5' 2 (1.575 m), weight 212 lb (96.2 kg).Body mass index is 38.78 kg/m.  General Appearance: Fairly Groomed  Eye Contact:  Fair  Speech:  Slow  Volume:  Decreased  Mood:  Euthymic  Affect:  Congruent  Thought Process:  Goal Directed  Orientation:  Full (Time, Place, and Person)  Thought Content: WDL   Suicidal Thoughts:  No  Homicidal Thoughts:  No  Memory:  Immediate;   Fair Recent;   Fair Remote;   Fair  Judgement:  Intact  Insight:  Present  Psychomotor Activity:  Decreased  Concentration:  Concentration: Fair and Attention Span: Fair  Recall:  [employer] of Knowledge: Fair  Language: Good  Akathisia:  No  Handed:  Right  AIMS (if indicated): not done  Assets:  Communication Skills Desire for Improvement Housing Social Support  ADL's:  Intact  Cognition: WNL  Sleep:  Good   Screenings: PHQ2-9    Flowsheet Row Office Visit from 12/11/2023 in [facility]  Dept Of [place]. [facility]  PHQ-2 Total Score 0   Flowsheet Row Office Visit from 12/11/2023 in [facility] Dept Of [place] [facility] ED to Hosp-Admission (Discharged) from 02/27/2022 in [place] [facility] ED to Hosp-Admission (Discharged) from 02/18/2022 in [place] [facility]U  C-SSRS RISK CATEGORY No Risk No Risk No Risk     Assessment and Plan: Patient is 73 year old Caucasian female with history of hypertension, sleep apnea, type 2 diabetes, memory deficit, paranoid schizophrenia, generalized anxiety disorder.  Reviewed collateral information from other provider including blood work results and current medication.  Discussed reducing the dose of medication as patient is stable and taking multiple medication but patient very reluctant to cut down the dose.  Discussed benzodiazepine dependence tolerance withdrawal and long-term effect on the memory.  Patient feels the medicine working and keeping her mood stable and she does not want to get more anxious.  She also does not want to use BiPAP/CPAP because she feel it causes more insomnia and anxiety.  Continue Geodon  80 mg at bedtime, Klonopin  1  mg at bedtime, trazodone  1 mg at bedtime and Paxil  40 mg daily.  Recommended to call back if she has any question or any concern.  Follow-up in 3 months.  Collaboration of Care: Collaboration of Care: Other provider involved in patient's care AEB notes are available in epic to review  Patient/Guardian was advised Release of Information must be obtained prior to any record release in order to collaborate their care with an outside provider. Patient/Guardian was advised if they have not already done so to contact the registration department to sign all necessary forms in order for us  to release information regarding their care.   Consent: Patient/Guardian gives verbal consent for treatment and assignment of benefits for services provided  during this visit. Patient/Guardian expressed understanding and agreed to proceed.    Leni ONEIDA Client, MD 01/07/2024, 1:22 PM

## 2024-02-19 ENCOUNTER — Other Ambulatory Visit: Payer: Self-pay | Admitting: Family Medicine

## 2024-02-19 DIAGNOSIS — Z1231 Encounter for screening mammogram for malignant neoplasm of breast: Secondary | ICD-10-CM

## 2024-03-02 ENCOUNTER — Other Ambulatory Visit (HOSPITAL_COMMUNITY): Payer: Self-pay | Admitting: Psychiatry

## 2024-03-02 DIAGNOSIS — F200 Paranoid schizophrenia: Secondary | ICD-10-CM

## 2024-03-02 DIAGNOSIS — R413 Other amnesia: Secondary | ICD-10-CM

## 2024-03-04 ENCOUNTER — Other Ambulatory Visit (HOSPITAL_COMMUNITY): Payer: Self-pay | Admitting: Psychiatry

## 2024-03-04 DIAGNOSIS — F411 Generalized anxiety disorder: Secondary | ICD-10-CM

## 2024-03-04 DIAGNOSIS — F200 Paranoid schizophrenia: Secondary | ICD-10-CM

## 2024-03-16 ENCOUNTER — Other Ambulatory Visit (HOSPITAL_COMMUNITY): Payer: Self-pay | Admitting: Psychiatry

## 2024-03-16 DIAGNOSIS — R413 Other amnesia: Secondary | ICD-10-CM

## 2024-03-16 DIAGNOSIS — F200 Paranoid schizophrenia: Secondary | ICD-10-CM

## 2024-03-22 ENCOUNTER — Ambulatory Visit

## 2024-04-06 ENCOUNTER — Other Ambulatory Visit (HOSPITAL_COMMUNITY): Payer: Self-pay | Admitting: *Deleted

## 2024-04-06 DIAGNOSIS — F200 Paranoid schizophrenia: Secondary | ICD-10-CM

## 2024-04-06 DIAGNOSIS — F411 Generalized anxiety disorder: Secondary | ICD-10-CM

## 2024-04-06 MED ORDER — CLONAZEPAM 1 MG PO TABS: 1.0000 mg | ORAL_TABLET | Freq: Every day | ORAL | 0 refills | Status: DC

## 2024-04-11 ENCOUNTER — Encounter: Payer: Self-pay | Admitting: *Deleted

## 2024-04-11 ENCOUNTER — Inpatient Hospital Stay: Attending: Nurse Practitioner

## 2024-04-11 ENCOUNTER — Inpatient Hospital Stay: Admitting: Nurse Practitioner

## 2024-04-11 ENCOUNTER — Other Ambulatory Visit (HOSPITAL_COMMUNITY): Payer: Self-pay | Admitting: *Deleted

## 2024-04-11 DIAGNOSIS — F200 Paranoid schizophrenia: Secondary | ICD-10-CM

## 2024-04-11 DIAGNOSIS — R413 Other amnesia: Secondary | ICD-10-CM

## 2024-04-11 MED ORDER — TRAZODONE HCL 100 MG PO TABS: 100.0000 mg | ORAL_TABLET | Freq: Every day | ORAL | 0 refills | Status: DC

## 2024-04-11 MED ORDER — PAROXETINE HCL 40 MG PO TABS: 40.0000 mg | ORAL_TABLET | Freq: Every morning | ORAL | 0 refills | Status: DC

## 2024-04-11 MED ORDER — ZIPRASIDONE HCL 80 MG PO CAPS: 80.0000 mg | ORAL_CAPSULE | Freq: Every day | ORAL | 0 refills | Status: DC

## 2024-04-11 NOTE — Progress Notes (Unsigned)
 Chelsea [name] was no show for lab/4 month f/u today. Scheduling message sent to reschedule for February.

## 2024-04-14 ENCOUNTER — Encounter (HOSPITAL_COMMUNITY): Payer: Self-pay | Admitting: Psychiatry

## 2024-04-14 ENCOUNTER — Other Ambulatory Visit: Payer: Self-pay

## 2024-04-14 ENCOUNTER — Ambulatory Visit (HOSPITAL_COMMUNITY): Admitting: Psychiatry

## 2024-04-14 VITALS — BP 138/80 | HR 68 | Ht 62.0 in | Wt 212.0 lb

## 2024-04-14 DIAGNOSIS — F200 Paranoid schizophrenia: Secondary | ICD-10-CM

## 2024-04-14 DIAGNOSIS — F411 Generalized anxiety disorder: Secondary | ICD-10-CM | POA: Diagnosis not present

## 2024-04-14 DIAGNOSIS — F5105 Insomnia due to other mental disorder: Secondary | ICD-10-CM

## 2024-04-14 DIAGNOSIS — F99 Mental disorder, not otherwise specified: Secondary | ICD-10-CM | POA: Diagnosis not present

## 2024-04-14 MED ORDER — ZIPRASIDONE HCL 80 MG PO CAPS: 80.0000 mg | ORAL_CAPSULE | Freq: Every day | ORAL | 0 refills | Status: AC

## 2024-04-14 MED ORDER — PAROXETINE HCL 40 MG PO TABS: 40.0000 mg | ORAL_TABLET | Freq: Every morning | ORAL | 0 refills | Status: AC

## 2024-04-14 MED ORDER — TRAZODONE HCL 100 MG PO TABS: 100.0000 mg | ORAL_TABLET | Freq: Every day | ORAL | 0 refills | Status: AC

## 2024-04-14 MED ORDER — CLONAZEPAM 1 MG PO TABS: 1.0000 mg | ORAL_TABLET | Freq: Every day | ORAL | 2 refills | Status: AC

## 2024-04-14 NOTE — Progress Notes (Signed)
 [facility] MD/PA/NP OP Progress Note  04/14/2024 1:09 PM Chelsea [name]  MRN:  [id number]  Chief Complaint:  Chief Complaint  Patient presents with   Anxiety   Follow-up   Medication Refill   HPI: Patient came to the office for her follow-up appointment.  She had a quite Christmas.  She is concerned about her brother-in-law who is Alzheimer is not deteriorated.  She worried about her sister who is a primary caretaker of her husband.  Patient told she visited to her niece on Christmas and able to visit other family members.  She reported things are going okay.  She is taking all her medication and denies any mania, paranoia, psychosis, panic attack.  She does not leave the house unless it is important.  She is using public transportation and scat services for doctors appointment.  Her appetite is okay.  Her primary care is [facility] which makes around at home.  She lives with a 2 dog and a cat.  She diagnosed with apnea but does not use any machine because she feel it wakes her up.  She liked the trazodone  and Klonopin  which is keeping her mood stable and helping her sleep.  Her appetite is okay.  Her weight is unchanged from the past.  She denies any irritability, active or passive suicidal thoughts.  She denies drinking or using any illegal substances.  She has no tremor or shakes.  Visit Diagnosis:    ICD-10-CM   1. Paranoid schizophrenia (HCC)  F20.0 clonazePAM  (KLONOPIN ) 1 MG tablet    PARoxetine  (PAXIL ) 40 MG tablet    traZODone  (DESYREL ) 100 MG tablet    ziprasidone  (GEODON ) 80 MG capsule    2. Generalized anxiety disorder  F41.1 clonazePAM  (KLONOPIN ) 1 MG tablet    3. Insomnia due to other mental disorder  F51.05 clonazePAM  (KLONOPIN ) 1 MG tablet   F99 traZODone  (DESYREL ) 100 MG tablet       Past Psychiatric History: Reviewed H/O inpatient at [facility] and [facility] for mania and psychosis.  No h/o suicidal attempt.    Past Medical History:  Past Medical History:   Diagnosis Date   Anxiety    Arthritis    Bipolar disorder (HCC)    COPD (chronic obstructive pulmonary disease) (HCC)    Diabetes mellitus type II    Esophageal stricture    moderate distal   GERD (gastroesophageal reflux disease)    Heart murmur    HAD AN ECHO YEARS AGO- NOTHING TO BE CONCERNED ABOUT   Hyperlipemia    Hypertension    Hypothyroidism    Mental disorder    Bipolar   Obesity    Oxygen  desaturation during sleep    wears 2 liters at bedtime    Schizophrenia (HCC)    patient reports that she is unaware of this   Shortness of breath    with a little activy   Sleep apnea    wears 2 liters of oxygen  at bedtime instead of CPAP    Past Surgical History:  Procedure Laterality Date   BALLOON DILATION N/A 06/10/2016   Procedure: BALLOON DILATION;  Surgeon: Jerrell Sol, MD;  Location: [facility] ENDOSCOPY;  Service: Endoscopy;  Laterality: N/A;   BREAST EXCISIONAL BIOPSY Left 05/10/2012   benign   CHOLECYSTECTOMY     COLONOSCOPY WITH PROPOFOL  N/A 01/30/2017   Procedure: COLONOSCOPY WITH PROPOFOL ;  Surgeon: Sol Jerrell, MD;  Location: [facility] ENDOSCOPY;  Service: Endoscopy;  Laterality: N/A;   COLONOSCOPY WITH PROPOFOL  N/A 09/29/2020  Procedure: COLONOSCOPY WITH PROPOFOL ;  Surgeon: Rosalie Kitchens, MD;  Location: [facility] ENDOSCOPY;  Service: Endoscopy;  Laterality: N/A;   ESOPHAGEAL MANOMETRY N/A 12/19/2020   Procedure: ESOPHAGEAL MANOMETRY (EM);  Surgeon: Dianna Specking, MD;  Location: [facility] ENDOSCOPY;  Service: Endoscopy;  Laterality: N/A;   ESOPHAGOGASTRODUODENOSCOPY N/A 06/03/2016   Procedure: ESOPHAGOGASTRODUODENOSCOPY (EGD);  Surgeon: Specking Dianna, MD;  Location: [facility] ENDOSCOPY;  Service: Endoscopy;  Laterality: N/A;   ESOPHAGOGASTRODUODENOSCOPY N/A 06/10/2016   Procedure: ESOPHAGOGASTRODUODENOSCOPY (EGD);  Surgeon: Specking Dianna, MD;  Location: [facility] ENDOSCOPY;  Service: Endoscopy;  Laterality: N/A;   ESOPHAGOGASTRODUODENOSCOPY (EGD) WITH PROPOFOL  N/A 09/28/2020   Procedure:  ESOPHAGOGASTRODUODENOSCOPY (EGD) WITH PROPOFOL ;  Surgeon: Burnette Fallow, MD;  Location: [facility] ENDOSCOPY;  Service: Endoscopy;  Laterality: N/A;   EYE SURGERY     PARTIAL MASTECTOMY WITH NEEDLE LOCALIZATION Left 05/10/2012   Procedure: PARTIAL MASTECTOMY WITH NEEDLE LOCALIZATION;  Surgeon: Elon CHRISTELLA Pacini, MD;  Location: [facility] OR;  Service: General;  Laterality: Left;   POLYPECTOMY  09/29/2020   Procedure: POLYPECTOMY;  Surgeon: Rosalie Kitchens, MD;  Location: [facility] ENDOSCOPY;  Service: Endoscopy;;   TONSILLECTOMY      Family Psychiatric History: Reviewed  Family History:  Family History  Problem Relation Age of Onset   Alzheimer's disease Mother    Alcohol  abuse Father    Stroke Father    Sleep apnea Sister    Restless legs syndrome Neg Hx    Seizures Neg Hx     Social History:  Social History   Socioeconomic History   Marital status: Divorced    Spouse name: Not on file   Number of children: 0   Years of education: HS   Highest education level: Not on file  Occupational History   Occupation: Retired   Tobacco Use   Smoking status: Former    Current packs/day: 0.00    Average packs/day: 3.0 packs/day for 29.0 years (87.0 ttl pk-yrs)    Types: Cigarettes    Start date: 05/03/1964    Quit date: 05/03/1993    Years since quitting: 30.9   Smokeless tobacco: Never  Vaping Use   Vaping status: Never Used  Substance and Sexual Activity   Alcohol  use: No    Alcohol /week: 0.0 standard drinks of alcohol    Drug use: No   Sexual activity: Not Currently  Other Topics Concern   Not on file  Social History Narrative   Caffeine: rare    Social Drivers of Health   Tobacco Use: Medium Risk (04/14/2024)   Patient History    Smoking Tobacco Use: Former    Smokeless Tobacco Use: Never    Passive Exposure: Not on [place] Strain: Not on file  Food Insecurity: Not on file  Transportation Needs: Not on file  Physical Activity: Not on file  Stress: Not on file  Social  Connections: Not on file  Depression (PHQ2-9): Low Risk (12/11/2023)   Depression (PHQ2-9)    PHQ-2 Score: 0  Alcohol  Screen: Not on file  Housing: Not on file  Utilities: Not on file  Health Literacy: Not on file    Allergies:  Allergies  Allergen Reactions   Darvocet [Propoxyphene N-Acetaminophen ] Anaphylaxis    Can take plain tylenol    Fluoxetine Other (See Comments)    Hallucinations    Penicillins Rash   Promethazine Hives    Metabolic Disorder Labs: Lab Results  Component Value Date   HGBA1C 7.5 (H) 02/18/2022   MPG 168.55 02/18/2022   MPG 139.85 03/20/2021   No  results found for: PROLACTIN Lab Results  Component Value Date   CHOL 109 03/20/2021   TRIG 61 03/20/2021   HDL 32 (L) 03/20/2021   CHOLHDL 3.4 03/20/2021   VLDL 12 03/20/2021   LDLCALC 65 03/20/2021   Lab Results  Component Value Date   TSH 0.216 (L) 02/28/2022   TSH 1.509 12/03/2012    Therapeutic Level Labs: No results found for: LITHIUM No results found for: VALPROATE No results found for: CBMZ  Current Medications: Current Outpatient Medications  Medication Sig Dispense Refill   acetaminophen  (TYLENOL ) 325 MG tablet Take 650 mg by mouth every 6 (six) hours as needed for moderate pain.     albuterol  (PROVENTIL ) (2.5 MG/3ML) 0.083% nebulizer solution Take 3 mLs (2.5 mg total) by nebulization every 6 (six) hours as needed for wheezing or shortness of breath. 75 mL 12   amLODipine  (NORVASC ) 10 MG tablet Take 10 mg by mouth daily.     bumetanide  (BUMEX ) 1 MG tablet Take 1 tablet (1 mg total) by mouth daily. 30 tablet 0   Cholecalciferol  (VITAMIN D3) 5000 units TABS Take 5,000 Units by mouth daily.     clonazePAM  (KLONOPIN ) 1 MG tablet Take 1 tablet (1 mg total) by mouth at bedtime. 10 tablet 0   diclofenac sodium (VOLTAREN) 1 % GEL Apply 2 g topically 4 (four) times daily as needed (pain).     Ensure Max Protein (ENSURE MAX PROTEIN) LIQD Take 330 mLs (11 oz total) by mouth 2 (two) times  daily. 3300 mL 0   ferrous gluconate  (FERGON) 324 MG tablet TAKE 1 TABLET BY MOUTH TWICE DAILY WITH MEALS 60 tablet 11   fluticasone  (FLONASE ) 50 MCG/ACT nasal spray Place 2 sprays into both nostrils daily. As needed     fluticasone  furoate-vilanterol (BREO ELLIPTA ) 200-25 MCG/INH AEPB Inhale 1 puff into the lungs every morning.     insulin  aspart (NOVOLOG  FLEXPEN) 100 UNIT/ML FlexPen Inject 2-12 Units into the skin 3 (three) times daily with meals. Sliding scale     LANTUS  100 UNIT/ML injection 35 Units at bedtime.     LEVEMIR  100 UNIT/ML injection Inject 30 Units into the skin at bedtime.     levothyroxine  (SYNTHROID ) 100 MCG tablet Take 1 tablet (100 mcg total) by mouth daily before breakfast. 30 tablet 3   metFORMIN (GLUCOPHAGE) 500 MG tablet Take 500 mg by mouth in the morning and at bedtime.     metoprolol  (TOPROL -XL) 200 MG 24 hr tablet Take 200 mg by mouth daily.     montelukast  (SINGULAIR ) 10 MG tablet Take 10 mg by mouth daily.     Multiple Vitamin (MULTIVITAMIN WITH MINERALS) TABS tablet Take 1 tablet by mouth daily. 30 tablet 0   nystatin  (MYCOSTATIN /NYSTOP ) powder Apply 1 Application topically 2 (two) times daily. Apply to rash under breast and groin rash. Follow up with PCP for further refills 15 g 0   omeprazole (PRILOSEC) 40 MG capsule Take 40 mg by mouth daily before breakfast.     ondansetron  (ZOFRAN ) 4 MG tablet Take 1 tablet (4 mg total) by mouth every 6 (six) hours as needed for nausea. 20 tablet 0   OZEMPIC, 2 MG/DOSE, 8 MG/3ML SOPN Inject 2 mg into the skin every Friday.     PARoxetine  (PAXIL ) 40 MG tablet Take 1 tablet (40 mg total) by mouth every morning. 5 tablet 0   polyethylene glycol (MIRALAX  / GLYCOLAX ) 17 g packet Take 17 g by mouth daily as needed for mild constipation. 14 each  0   rOPINIRole  (REQUIP ) 0.25 MG tablet TAKE 3 TABLETS BY MOUTH AT BEDTIME 90 tablet 11   rosuvastatin  (CRESTOR ) 10 MG tablet Take 10 mg by mouth at bedtime.     traZODone  (DESYREL ) 100 MG  tablet Take 1 tablet (100 mg total) by mouth at bedtime. 5 tablet 0   TRUEPLUS INSULIN  SYRINGE 29G X 1/2 1 ML MISC 1 each by Other route daily.     VENTOLIN  HFA 108 (90 Base) MCG/ACT inhaler Inhale 1-2 puffs into the lungs every 4 (four) hours as needed for wheezing or shortness of breath.     ziprasidone  (GEODON ) 80 MG capsule Take 1 capsule (80 mg total) by mouth at bedtime. 5 capsule 0   No current facility-administered medications for this visit.     Musculoskeletal: Strength & Muscle Tone: within normal limits Gait & Station: normal Patient leans: N/A  Psychiatric Specialty Exam: Review of Systems  Blood pressure 138/80, pulse 68, height 5' 2 (1.575 m), weight 212 lb (96.2 kg).Body mass index is 38.78 kg/m.  General Appearance: Fairly Groomed  Eye Contact:  Fair  Speech:  Slow  Volume:  Decreased  Mood:  Anxious  Affect:  Congruent  Thought Process:  Goal Directed  Orientation:  Full (Time, Place, and Person)  Thought Content: WDL   Suicidal Thoughts:  No  Homicidal Thoughts:  No  Memory:  Immediate;   Fair Recent;   Fair Remote;   Fair  Judgement:  Intact  Insight:  Present  Psychomotor Activity:  Decreased  Concentration:  Concentration: Fair and Attention Span: Fair  Recall:  [employer] of Knowledge: Fair  Language: Good  Akathisia:  No  Handed:  Right  AIMS (if indicated): not done  Assets:  Communication Skills Desire for Improvement Housing Social Support  ADL's:  Intact  Cognition: WNL  Sleep:  ok   Screenings: PHQ2-9    Flowsheet Row Office Visit from 12/11/2023 in [facility] Of [place]. [facility]  PHQ-2 Total Score 0   Flowsheet Row Office Visit from 12/11/2023 in [facility] Of [place]. [facility] ED to Hosp-Admission (Discharged) from 02/27/2022 in [facility] to Hosp-Admission (Discharged) from 02/18/2022 in [facility]  C-SSRS  RISK CATEGORY No Risk No Risk No Risk     Assessment and Plan: Patient is 74 year old Caucasian female with history of hypertension, sleep apnea, type 2 diabetes, memory deficit, paranoid schizophrenia, generalized anxiety disorder.  Patient is stable on current medication.  She does not want to cut down the medication as she feels her current dosage are working very well.  Discussed polypharmacy.  She is not asking early refills for her benzodiazepine.  Continue Geodon  80 mg at bedtime, Klonopin  1 mg at bedtime, trazodone  100 mg at bedtime and Paxil  40 mg daily.  Discussed benzodiazepine dependency tolerance withdrawal.  Recommended to call back if she is any question or any concern.  Follow-up in 3 months.  Collaboration of Care: Collaboration of Care: Other provider involved in patient's care AEB notes are available in epic to review  Patient/Guardian was advised Release of Information must be obtained prior to any record release in order to collaborate their care with an outside provider. Patient/Guardian was advised if they have not already done so to contact the registration department to sign all necessary forms in order for us  to release information regarding their care.  Consent: Patient/Guardian gives verbal consent for treatment and assignment of benefits for services provided during this visit. Patient/Guardian expressed understanding and agreed to proceed.    Leni ONEIDA Client, MD 04/14/2024, 1:09 PM

## 2024-04-21 ENCOUNTER — Ambulatory Visit: Admitting: Podiatry

## 2024-05-02 ENCOUNTER — Telehealth: Payer: Self-pay

## 2024-05-02 NOTE — Telephone Encounter (Signed)
 Tuesday 05/03/24 appointments canceled. Patient is aware and scheduling will call to reschedule.

## 2024-05-03 ENCOUNTER — Inpatient Hospital Stay: Admitting: Nurse Practitioner

## 2024-05-03 ENCOUNTER — Inpatient Hospital Stay

## 2024-05-24 ENCOUNTER — Inpatient Hospital Stay

## 2024-05-24 ENCOUNTER — Inpatient Hospital Stay: Admitting: Nurse Practitioner

## 2024-07-14 ENCOUNTER — Ambulatory Visit (HOSPITAL_COMMUNITY): Admitting: Psychiatry

## 2024-10-13 ENCOUNTER — Ambulatory Visit: Admitting: Adult Health
